# Patient Record
Sex: Female | Born: 1980 | Race: Black or African American | Hispanic: No | Marital: Single | Smoking: Current every day smoker
Health system: Southern US, Community
[De-identification: ages and names within clinical notes are randomized; demographics above are authoritative.]

## PROBLEM LIST (undated history)

## (undated) DIAGNOSIS — M1990 Unspecified osteoarthritis, unspecified site: Secondary | ICD-10-CM

## (undated) DIAGNOSIS — R21 Rash and other nonspecific skin eruption: Secondary | ICD-10-CM

## (undated) DIAGNOSIS — M329 Systemic lupus erythematosus, unspecified: Secondary | ICD-10-CM

## (undated) DIAGNOSIS — N179 Acute kidney failure, unspecified: Secondary | ICD-10-CM

## (undated) DIAGNOSIS — M3214 Glomerular disease in systemic lupus erythematosus: Secondary | ICD-10-CM

## (undated) DIAGNOSIS — I10 Essential (primary) hypertension: Secondary | ICD-10-CM

## (undated) DIAGNOSIS — Z87442 Personal history of urinary calculi: Secondary | ICD-10-CM

## (undated) DIAGNOSIS — D649 Anemia, unspecified: Secondary | ICD-10-CM

## (undated) DIAGNOSIS — N200 Calculus of kidney: Secondary | ICD-10-CM

## (undated) HISTORY — PX: TUBAL LIGATION: SHX77

## (undated) HISTORY — PX: PERITONEAL CATHETER INSERTION: SHX2223

---

## 2006-12-06 ENCOUNTER — Emergency Department: Payer: Self-pay | Admitting: Emergency Medicine

## 2007-02-08 ENCOUNTER — Encounter: Payer: Self-pay | Admitting: Maternal & Fetal Medicine

## 2007-03-22 ENCOUNTER — Encounter: Payer: Self-pay | Admitting: Maternal & Fetal Medicine

## 2007-04-30 ENCOUNTER — Encounter: Payer: Self-pay | Admitting: Maternal & Fetal Medicine

## 2007-06-06 ENCOUNTER — Ambulatory Visit: Payer: Self-pay | Admitting: Obstetrics and Gynecology

## 2007-06-07 ENCOUNTER — Inpatient Hospital Stay: Payer: Self-pay | Admitting: Obstetrics and Gynecology

## 2008-05-18 ENCOUNTER — Emergency Department: Payer: Self-pay | Admitting: Emergency Medicine

## 2009-07-13 ENCOUNTER — Emergency Department: Payer: Self-pay | Admitting: Emergency Medicine

## 2009-11-17 ENCOUNTER — Emergency Department: Payer: Self-pay | Admitting: Emergency Medicine

## 2010-09-06 ENCOUNTER — Emergency Department: Payer: Self-pay | Admitting: Emergency Medicine

## 2014-06-10 ENCOUNTER — Emergency Department: Payer: Self-pay | Admitting: Internal Medicine

## 2014-09-16 ENCOUNTER — Emergency Department: Payer: Self-pay | Admitting: Emergency Medicine

## 2014-09-16 LAB — WET PREP, GENITAL

## 2014-09-16 LAB — URINALYSIS, COMPLETE
BILIRUBIN, UR: NEGATIVE
Glucose,UR: NEGATIVE mg/dL (ref 0–75)
KETONE: NEGATIVE
NITRITE: NEGATIVE
Ph: 7 (ref 4.5–8.0)
Protein: 30
Specific Gravity: 1.016 (ref 1.003–1.030)

## 2014-09-16 LAB — GC/CHLAMYDIA PROBE AMP

## 2014-09-16 LAB — RAPID HIV SCREEN (HIV 1/2 AB+AG)

## 2016-03-20 ENCOUNTER — Encounter: Payer: Self-pay | Admitting: Emergency Medicine

## 2016-03-20 ENCOUNTER — Emergency Department
Admission: EM | Admit: 2016-03-20 | Discharge: 2016-03-20 | Disposition: A | Discharge status: Home / Self Care | Payer: Self-pay | Attending: Emergency Medicine | Admitting: Emergency Medicine

## 2016-03-20 DIAGNOSIS — F17210 Nicotine dependence, cigarettes, uncomplicated: Secondary | ICD-10-CM | POA: Insufficient documentation

## 2016-03-20 DIAGNOSIS — F1290 Cannabis use, unspecified, uncomplicated: Secondary | ICD-10-CM | POA: Insufficient documentation

## 2016-03-20 DIAGNOSIS — N390 Urinary tract infection, site not specified: Secondary | ICD-10-CM | POA: Insufficient documentation

## 2016-03-20 DIAGNOSIS — B9689 Other specified bacterial agents as the cause of diseases classified elsewhere: Secondary | ICD-10-CM

## 2016-03-20 DIAGNOSIS — N938 Other specified abnormal uterine and vaginal bleeding: Secondary | ICD-10-CM | POA: Insufficient documentation

## 2016-03-20 DIAGNOSIS — N760 Acute vaginitis: Secondary | ICD-10-CM | POA: Insufficient documentation

## 2016-03-20 LAB — COMPREHENSIVE METABOLIC PANEL
ALT: 17 U/L (ref 14–54)
AST: 36 U/L (ref 15–41)
Albumin: 4.1 g/dL (ref 3.5–5.0)
Alkaline Phosphatase: 49 U/L (ref 38–126)
Anion gap: 8 (ref 5–15)
BUN: 11 mg/dL (ref 6–20)
CALCIUM: 9 mg/dL (ref 8.9–10.3)
CO2: 25 mmol/L (ref 22–32)
Chloride: 104 mmol/L (ref 101–111)
Creatinine, Ser: 0.88 mg/dL (ref 0.44–1.00)
GLUCOSE: 112 mg/dL — AB (ref 65–99)
Potassium: 3.5 mmol/L (ref 3.5–5.1)
Sodium: 137 mmol/L (ref 135–145)
TOTAL PROTEIN: 8 g/dL (ref 6.5–8.1)
Total Bilirubin: 0.4 mg/dL (ref 0.3–1.2)

## 2016-03-20 LAB — CBC
HEMATOCRIT: 33.4 % — AB (ref 35.0–47.0)
HEMOGLOBIN: 10.9 g/dL — AB (ref 12.0–16.0)
MCH: 29.5 pg (ref 26.0–34.0)
MCHC: 32.8 g/dL (ref 32.0–36.0)
MCV: 89.9 fL (ref 80.0–100.0)
Platelets: 181 10*3/uL (ref 150–440)
RBC: 3.71 MIL/uL — ABNORMAL LOW (ref 3.80–5.20)
RDW: 14.7 % — AB (ref 11.5–14.5)
WBC: 4.8 10*3/uL (ref 3.6–11.0)

## 2016-03-20 LAB — WET PREP, GENITAL
Sperm: NONE SEEN
TRICH WET PREP: NONE SEEN
YEAST WET PREP: NONE SEEN

## 2016-03-20 LAB — URINALYSIS COMPLETE WITH MICROSCOPIC (ARMC ONLY)
Bilirubin Urine: NEGATIVE
Glucose, UA: NEGATIVE mg/dL
Ketones, ur: NEGATIVE mg/dL
Nitrite: NEGATIVE
PH: 8 (ref 5.0–8.0)
PROTEIN: 30 mg/dL — AB
Specific Gravity, Urine: 1.017 (ref 1.005–1.030)

## 2016-03-20 LAB — CHLAMYDIA/NGC RT PCR (ARMC ONLY)
Chlamydia Tr: NOT DETECTED
N gonorrhoeae: NOT DETECTED

## 2016-03-20 LAB — POCT PREGNANCY, URINE: Preg Test, Ur: NEGATIVE

## 2016-03-20 LAB — LIPASE, BLOOD: LIPASE: 17 U/L (ref 11–51)

## 2016-03-20 MED ORDER — CEPHALEXIN 500 MG PO CAPS: 500.0000 mg | ORAL_CAPSULE | Freq: Three times a day (TID) | ORAL | Status: DC

## 2016-03-20 MED ORDER — CEPHALEXIN 500 MG PO CAPS
500.0000 mg | ORAL_CAPSULE | Freq: Once | ORAL | Status: AC
  Administered 2016-03-20: 500 mg via ORAL
  Filled 2016-03-20: qty 1

## 2016-03-20 MED ORDER — FLUCONAZOLE 100 MG PO TABS: 100.0000 mg | ORAL_TABLET | Freq: Every day | ORAL | Status: AC

## 2016-03-20 MED ORDER — METRONIDAZOLE 500 MG PO TABS: 500.0000 mg | ORAL_TABLET | Freq: Two times a day (BID) | ORAL | Status: AC

## 2016-03-20 NOTE — ED Notes (Signed)
Pt states she had a period earlier this month but started spotting after period was over. Pt states she has been having clots and also c/o abdominal pain. Pt also states she sees blood only when she is going to the bathroom.  Denies any burning or discharge.

## 2016-03-20 NOTE — ED Provider Notes (Signed)
[facility] Emergency Department Provider Note  Time seen: 3:14 PM  I have reviewed the triage vital signs and the nursing notes.   HISTORY  Chief Complaint Vaginal Bleeding and Abdominal Pain    HPI Patricia Maldonado is a 35 y.o. female with no past medical history who presents to the emergency department with lower abdominal discomfort and vaginal bleeding. According to the patient since yesterday she has been having an urge to urinate but only a little bit of urine comes out. States lower abdominal pressure. She also states her period ended 2 days ago but she continues to have spotting and bleeding which is abnormal for her. Patient denies any dysuria, nausea, vomiting, diarrhea or fever. Denies any vaginal discharge but states continued mild vaginal bleeding.     History reviewed. No pertinent past medical history.  There are no active problems to display for this patient.   Past Surgical History  Procedure Laterality Date  . Tubal ligation      No current outpatient prescriptions on file.  Allergies Penicillins  Family History  Problem Relation Age of Onset  . Thyroid disease Mother   . Thyroid disease Father     Social History Social History  Substance Use Topics  . Smoking status: Current Every Day Smoker -- 1.00 packs/day    Types: Cigarettes  . Smokeless tobacco: None  . Alcohol Use: No    Review of Systems Constitutional: Negative for fever. Cardiovascular: Negative for chest pain. Respiratory: Negative for shortness of breath. Gastrointestinal: Lower abdominal pressure. Negative for nausea, vomiting, diarrhea Genitourinary: Negative for dysuria. Positive for urinary urgency. Musculoskeletal: Negative for back pain. Neurological: Negative for headache 10-point ROS otherwise negative.  ____________________________________________   PHYSICAL EXAM:  VITAL SIGNS: ED Triage Vitals  Enc Vitals Group     BP 03/20/16 1311  138/85 mmHg     Pulse Rate 03/20/16 1311 85     Resp 03/20/16 1311 18     Temp 03/20/16 1311 98.2 F (36.8 C)     Temp Source 03/20/16 1311 Oral     SpO2 03/20/16 1311 100 %     Weight 03/20/16 1311 105 lb (47.628 kg)     Height 03/20/16 1311 5\' 2"  (1.575 m)     Head Cir --      Peak Flow --      Pain Score 03/20/16 1312 5     Pain Loc --      Pain Edu? --      Excl. in [place]? --     Constitutional: Alert and oriented. Well appearing and in no distress. Eyes: Normal exam ENT   Head: Normocephalic and atraumatic.   Mouth/Throat: Mucous membranes are moist. Cardiovascular: Normal rate, regular rhythm. No murmur Respiratory: Normal respiratory effort without tachypnea nor retractions. Breath sounds are clear Gastrointestinal: Soft and nontender. No distention.  Musculoskeletal: Nontender with normal range of motion in all extremities. Neurologic:  Normal speech and language. No gross focal neurologic deficits  Skin:  Skin is warm, dry and intact.  Psychiatric: Mood and affect are normal.   ____________________________________________    INITIAL IMPRESSION / ASSESSMENT AND PLAN / ED COURSE  Pertinent labs & imaging results that were available during my care of the patient were reviewed by me and considered in my medical decision making (see chart for details).  Patient presents the emergency department lower abdominal pain/pressure, urinary urgency and continued vaginal bleeding. States her period stopped 2 days ago but then restarted and  she is having mild bleeding. Denies any vaginal discharge. Patient does not believe she is at risk for sexually transmitted diseases. Patient's labs are largely within normal limits besides a urinalysis consistent with a urinary tract infection including white blood cell clumps. We'll place the patient on Keflex, we will perform a pelvic examination, and continue to closely monitor in the emergency department.  Pelvic examination shows mild  clear vaginal discharge, no bleeding at this time. No cervical motion tenderness. No adnexal tenderness.  Wet prep consistent with bacterial vaginitis we'll discharge with Flagyl and Keflex, I will write the patient for a Diflucan tablet to be used if needed given the history of [place] infections in the past. ____________________________________________   FINAL CLINICAL IMPRESSION(S) / ED DIAGNOSES  Lower abdominal pain Dysfunction uterine bleeding Urinary tract infection   Harvest Dark, MD 03/20/16 1615

## 2016-03-20 NOTE — ED Notes (Signed)
Patient had period earlier this month and it was normal. Patient denies any changes with her medications. Urgency to urinate, denies frequency or painful urination. States bleeding only with urination.

## 2016-08-30 ENCOUNTER — Encounter: Payer: Self-pay | Admitting: Emergency Medicine

## 2016-08-30 ENCOUNTER — Inpatient Hospital Stay
Admission: EM | Admit: 2016-08-30 | Discharge: 2016-09-04 | DRG: 689 | Disposition: A | Discharge status: Home / Self Care | Payer: Medicaid Other | Attending: Internal Medicine | Admitting: Internal Medicine

## 2016-08-30 DIAGNOSIS — R110 Nausea: Secondary | ICD-10-CM | POA: Diagnosis present

## 2016-08-30 DIAGNOSIS — R809 Proteinuria, unspecified: Secondary | ICD-10-CM | POA: Diagnosis present

## 2016-08-30 DIAGNOSIS — N170 Acute kidney failure with tubular necrosis: Secondary | ICD-10-CM | POA: Diagnosis present

## 2016-08-30 DIAGNOSIS — F17210 Nicotine dependence, cigarettes, uncomplicated: Secondary | ICD-10-CM | POA: Diagnosis present

## 2016-08-30 DIAGNOSIS — E860 Dehydration: Secondary | ICD-10-CM | POA: Diagnosis present

## 2016-08-30 DIAGNOSIS — Z8349 Family history of other endocrine, nutritional and metabolic diseases: Secondary | ICD-10-CM

## 2016-08-30 DIAGNOSIS — N200 Calculus of kidney: Secondary | ICD-10-CM

## 2016-08-30 DIAGNOSIS — L259 Unspecified contact dermatitis, unspecified cause: Secondary | ICD-10-CM | POA: Diagnosis not present

## 2016-08-30 DIAGNOSIS — N3001 Acute cystitis with hematuria: Secondary | ICD-10-CM | POA: Diagnosis not present

## 2016-08-30 DIAGNOSIS — K5900 Constipation, unspecified: Secondary | ICD-10-CM | POA: Diagnosis not present

## 2016-08-30 DIAGNOSIS — Z880 Allergy status to penicillin: Secondary | ICD-10-CM | POA: Diagnosis not present

## 2016-08-30 DIAGNOSIS — N390 Urinary tract infection, site not specified: Secondary | ICD-10-CM

## 2016-08-30 DIAGNOSIS — N179 Acute kidney failure, unspecified: Secondary | ICD-10-CM

## 2016-08-30 DIAGNOSIS — N058 Unspecified nephritic syndrome with other morphologic changes: Secondary | ICD-10-CM | POA: Diagnosis present

## 2016-08-30 LAB — URINALYSIS COMPLETE WITH MICROSCOPIC (ARMC ONLY)
BACTERIA UA: NONE SEEN
BILIRUBIN URINE: NEGATIVE
GLUCOSE, UA: NEGATIVE mg/dL
Ketones, ur: NEGATIVE mg/dL
NITRITE: POSITIVE — AB
Protein, ur: >500 mg/dL — AB
SPECIFIC GRAVITY, URINE: 1.016 (ref 1.005–1.030)
pH: 6 (ref 5.0–8.0)

## 2016-08-30 LAB — CBC
HEMATOCRIT: 34.9 % — AB (ref 35.0–47.0)
Hemoglobin: 11.7 g/dL — ABNORMAL LOW (ref 12.0–16.0)
MCH: 30 pg (ref 26.0–34.0)
MCHC: 33.4 g/dL (ref 32.0–36.0)
MCV: 89.8 fL (ref 80.0–100.0)
PLATELETS: 152 10*3/uL (ref 150–440)
RBC: 3.89 MIL/uL (ref 3.80–5.20)
RDW: 14.8 % — AB (ref 11.5–14.5)
WBC: 5 10*3/uL (ref 3.6–11.0)

## 2016-08-30 LAB — COMPREHENSIVE METABOLIC PANEL
ALBUMIN: 2.7 g/dL — AB (ref 3.5–5.0)
ALK PHOS: 75 U/L (ref 38–126)
ALT: 18 U/L (ref 14–54)
AST: 26 U/L (ref 15–41)
Anion gap: 7 (ref 5–15)
BILIRUBIN TOTAL: 0.3 mg/dL (ref 0.3–1.2)
BUN: 16 mg/dL (ref 6–20)
CO2: 23 mmol/L (ref 22–32)
CREATININE: 2.23 mg/dL — AB (ref 0.44–1.00)
Calcium: 8.7 mg/dL — ABNORMAL LOW (ref 8.9–10.3)
Chloride: 108 mmol/L (ref 101–111)
GFR calc Af Amer: 32 mL/min — ABNORMAL LOW (ref >60)
GFR, EST NON AFRICAN AMERICAN: 28 mL/min — AB (ref >60)
GLUCOSE: 106 mg/dL — AB (ref 65–99)
POTASSIUM: 3.4 mmol/L — AB (ref 3.5–5.1)
Sodium: 138 mmol/L (ref 135–145)
TOTAL PROTEIN: 7.2 g/dL (ref 6.5–8.1)

## 2016-08-30 LAB — LACTIC ACID, PLASMA: LACTIC ACID, VENOUS: 1 mmol/L (ref 0.5–1.9)

## 2016-08-30 LAB — LIPASE, BLOOD: Lipase: 18 U/L (ref 11–51)

## 2016-08-30 MED ORDER — HEPARIN SODIUM (PORCINE) 5000 UNIT/ML IJ SOLN
5000.0000 [IU] | Freq: Three times a day (TID) | INTRAMUSCULAR | Status: DC
Start: 2016-08-30 — End: 2016-09-02
  Administered 2016-08-30 – 2016-09-02 (×7): 5000 [IU] via SUBCUTANEOUS
  Filled 2016-08-30 (×7): qty 1

## 2016-08-30 MED ORDER — ACETAMINOPHEN 325 MG PO TABS
650.0000 mg | ORAL_TABLET | Freq: Four times a day (QID) | ORAL | Status: DC | PRN
  Administered 2016-09-02 – 2016-09-03 (×2): 650 mg via ORAL
  Filled 2016-08-30 (×2): qty 2

## 2016-08-30 MED ORDER — OXYCODONE HCL 5 MG PO TABS
5.0000 mg | ORAL_TABLET | ORAL | Status: DC | PRN
  Administered 2016-09-01: 5 mg via ORAL
  Filled 2016-08-30: qty 1

## 2016-08-30 MED ORDER — ONDANSETRON HCL 4 MG PO TABS
4.0000 mg | ORAL_TABLET | Freq: Four times a day (QID) | ORAL | Status: DC | PRN
  Administered 2016-09-01: 4 mg via ORAL
  Filled 2016-08-30: qty 1

## 2016-08-30 MED ORDER — ONDANSETRON 4 MG PO TBDP
4.0000 mg | ORAL_TABLET | Freq: Once | ORAL | Status: AC | PRN
  Administered 2016-08-30: 4 mg via ORAL
  Filled 2016-08-30: qty 1

## 2016-08-30 MED ORDER — CEFTRIAXONE SODIUM-DEXTROSE 1-3.74 GM-% IV SOLR
1.0000 g | Freq: Once | INTRAVENOUS | Status: AC
  Administered 2016-08-30: 1 g via INTRAVENOUS
  Filled 2016-08-30: qty 50

## 2016-08-30 MED ORDER — CEFTRIAXONE SODIUM-DEXTROSE 1-3.74 GM-% IV SOLR
1.0000 g | INTRAVENOUS | Status: DC
  Administered 2016-08-31 – 2016-09-03 (×4): 1 g via INTRAVENOUS
  Filled 2016-08-30 (×5): qty 50

## 2016-08-30 MED ORDER — ONDANSETRON HCL 4 MG/2ML IJ SOLN
4.0000 mg | Freq: Four times a day (QID) | INTRAMUSCULAR | Status: DC | PRN
  Administered 2016-09-03: 4 mg via INTRAVENOUS
  Filled 2016-08-30: qty 2

## 2016-08-30 MED ORDER — SODIUM CHLORIDE 0.9 % IV BOLUS (SEPSIS)
1000.0000 mL | Freq: Once | INTRAVENOUS | Status: AC
  Administered 2016-08-30: 1000 mL via INTRAVENOUS

## 2016-08-30 MED ORDER — SODIUM CHLORIDE 0.9% FLUSH
3.0000 mL | Freq: Two times a day (BID) | INTRAVENOUS | Status: DC
  Administered 2016-08-30 – 2016-08-31 (×2): 3 mL via INTRAVENOUS

## 2016-08-30 MED ORDER — DEXTROSE 5 % IV SOLN: 1.0000 g | Freq: Once | INTRAVENOUS | Status: DC

## 2016-08-30 MED ORDER — DEXTROSE 5 % IV SOLN: 1.0000 g | INTRAVENOUS | Status: DC

## 2016-08-30 MED ORDER — ACETAMINOPHEN 650 MG RE SUPP: 650.0000 mg | Freq: Four times a day (QID) | RECTAL | Status: DC | PRN

## 2016-08-30 MED ORDER — SODIUM CHLORIDE 0.9 % IV SOLN
INTRAVENOUS | Status: DC
  Administered 2016-08-30 – 2016-09-04 (×8): via INTRAVENOUS

## 2016-08-30 NOTE — ED Provider Notes (Signed)
[facility] Emergency Department Provider Note   ____________________________________________   I have reviewed the triage vital signs and the nursing notes.   HISTORY  Chief Complaint Emesis; Rash; and Fever   History limited by: Not Limited   HPI Patricia Maldonado is a 35 y.o. female who presents to the emergency department today with concerns forfevers, nausea vomiting, and rash. The patient states that the symptoms started roughly 3 days ago. She states that her initial symptoms were for the nausea and vomiting. She had multiple episodes of vomiting she denies being able to keep any food in her stomach. In addition she started developed fevers. Did have some chills. The patient then developed a rash. She describes it as itchy. It is primarily of her thorax.    History reviewed. No pertinent past medical history.  There are no active problems to display for this patient.   Past Surgical History:  Procedure Laterality Date  . TUBAL LIGATION      Prior to Admission medications   Medication Sig Start Date End Date Taking? Authorizing Provider  cephALEXin (KEFLEX) 500 MG capsule Take 1 capsule (500 mg total) by mouth 3 (three) times daily. 03/20/16   Harvest Dark, MD    Allergies Penicillins  Family History  Problem Relation Age of Onset  . Thyroid disease Mother   . Thyroid disease Father     Social History Social History  Substance Use Topics  . Smoking status: Current Every Day Smoker    Packs/day: 0.50    Types: Cigarettes  . Smokeless tobacco: Never Used  . Alcohol use No    Review of Systems  Constitutional: Positive for fever and chills. Cardiovascular: Negative for chest pain. Respiratory: Negative for shortness of breath. Gastrointestinal: Positive for nausea and vomiting. Genitourinary: Negative for dysuria. Musculoskeletal: Negative for back pain. Skin: Positive for rash. Neurological: Negative for headaches, focal  weakness or numbness.  10-point ROS otherwise negative.  ____________________________________________   PHYSICAL EXAM:  VITAL SIGNS: ED Triage Vitals  Enc Vitals Group     BP 08/30/16 1708 (!) 131/94     Pulse Rate 08/30/16 1708 (!) 105     Resp 08/30/16 1708 18     Temp 08/30/16 1708 98.2 F (36.8 C)     Temp Source 08/30/16 1708 Oral     SpO2 08/30/16 1708 100 %     Weight 08/30/16 1709 100 lb (45.4 kg)     Height 08/30/16 1709 5\' 2"  (1.575 m)     Head Circumference --      Peak Flow --      Pain Score 08/30/16 1708 5   Constitutional: Alert and oriented. Well appearing and in no distress. Eyes: Conjunctivae are normal. Normal extraocular movements. ENT   Head: Normocephalic and atraumatic.   Nose: No congestion/rhinnorhea.   Mouth/Throat: Mucous membranes are moist.   Neck: No stridor. Hematological/Lymphatic/Immunilogical: No cervical lymphadenopathy. Cardiovascular: Tachycardic, regular rhythm.  No murmurs, rubs, or gallops.  Respiratory: Normal respiratory effort without tachypnea nor retractions. Breath sounds are clear and equal bilaterally. No wheezes/rales/rhonchi. Gastrointestinal: Soft and nontender. No distention.  Genitourinary: Deferred Musculoskeletal: Normal range of motion in all extremities. No lower extremity edema. Neurologic:  Normal speech and language. No gross focal neurologic deficits are appreciated.  Skin:  Skin is warm, dry and intact. Urticarial type rash noted to back. Psychiatric: Mood and affect are normal. Speech and behavior are normal. Patient exhibits appropriate insight and judgment.  ____________________________________________    LABS (  pertinent positives/negatives)  Labs Reviewed  COMPREHENSIVE METABOLIC PANEL - Abnormal; Notable for the following:       Result Value   Potassium 3.4 (*)    Glucose, Bld 106 (*)    Creatinine, Ser 2.23 (*)    Calcium 8.7 (*)    Albumin 2.7 (*)    GFR calc non Af Amer 28 (*)     GFR calc Af Amer 32 (*)    All other components within normal limits  CBC - Abnormal; Notable for the following:    Hemoglobin 11.7 (*)    HCT 34.9 (*)    RDW 14.8 (*)    All other components within normal limits  URINALYSIS COMPLETEWITH MICROSCOPIC ([facility] ONLY) - Abnormal; Notable for the following:    Color, Urine YELLOW (*)    APPearance CLOUDY (*)    Hgb urine dipstick 2+ (*)    Protein, ur >500 (*)    Nitrite POSITIVE (*)    Leukocytes, UA 3+ (*)    Squamous Epithelial / LPF 6-30 (*)    All other components within normal limits  URINE CULTURE  CULTURE, BLOOD (ROUTINE X 2)  CULTURE, BLOOD (ROUTINE X 2)  LIPASE, BLOOD  LACTIC ACID, PLASMA  BASIC METABOLIC PANEL  CBC     ____________________________________________   EKG  None  ____________________________________________    RADIOLOGY  None  ____________________________________________   PROCEDURES  Procedures  ____________________________________________   INITIAL IMPRESSION / ASSESSMENT AND PLAN / ED COURSE  Pertinent labs & imaging results that were available during my care of the patient were reviewed by me and considered in my medical decision making (see chart for details).  Patient presented to the emergency department today because of concerns for fever, vomiting and rash. Rash appears urticarial in nature. Patient's blood work was concerning for increased creatinine and acute kidney injury, likely secondary to dehydration secondary to poor oral intake. Additionally patient's urine was concerning for urinary tract infection. This could be the source of the patient's nausea and vomiting. Will treat with IV antibiotics. Patient was given IV fluids. Will require admission to the hospital service for further workup and management. ____________________________________________   FINAL CLINICAL IMPRESSION(S) / ED DIAGNOSES  Final diagnoses:  AKI (acute kidney injury) ([place])  Urinary tract infection  without hematuria, site unspecified     Note: This dictation was prepared with Dragon dictation. Any transcriptional errors that result from this process are unintentional    Nance Pear, MD 08/30/16 [phone]

## 2016-08-30 NOTE — H&P (Signed)
[place] [facility] [place] NAME: Patricia Maldonado    MR#:  [id number]  DATE OF BIRTH:  21-Jan-1981   DATE OF ADMISSION:  08/30/2016  PRIMARY CARE PHYSICIAN: No primary care provider on file.   REQUESTING/REFERRING PHYSICIAN: Archie Balboa  CHIEF COMPLAINT:   Chief Complaint  Patient presents with  . Emesis  . Rash  . Fever    HISTORY OF PRESENT ILLNESS:  Patricia Maldonado  is a 35 y.o. female without significant medical history who is presenting with fever and chills. States she's had about 1 week duration of nausea with vomiting nonbloody nonbilious emesis and watery diarrhea, weakness fatigue, poor by mouth intake. Her symptoms have slowly started to resolve and now she feels like she's having fever and chills. The Hospital further workup and evaluation.  PAST MEDICAL HISTORY:  History reviewed. No pertinent past medical history.  PAST SURGICAL HISTORY:   Past Surgical History:  Procedure Laterality Date  . TUBAL LIGATION      SOCIAL HISTORY:   Social History  Substance Use Topics  . Smoking status: Current Every Day Smoker    Packs/day: 0.50    Types: Cigarettes  . Smokeless tobacco: Never Used  . Alcohol use No    FAMILY HISTORY:   Family History  Problem Relation Age of Onset  . Thyroid disease Mother   . Thyroid disease Father     DRUG ALLERGIES:   Allergies  Allergen Reactions  . Penicillins     Yeast infection    REVIEW OF SYSTEMS:  REVIEW OF SYSTEMS:  CONSTITUTIONAL: Positive fevers, chills, fatigue, weakness.  EYES: Denies blurred vision, double vision, or eye pain.  EARS, NOSE, THROAT: Denies tinnitus, ear pain, hearing loss.  RESPIRATORY: denies cough, shortness of breath, wheezing  CARDIOVASCULAR: Denies chest pain, palpitations, edema.  GASTROINTESTINAL: Positive nausea, vomiting, diarrhea, denies abdominal pain.  GENITOURINARY: Denies dysuria, hematuria.  ENDOCRINE: Denies nocturia or thyroid  problems. HEMATOLOGIC AND LYMPHATIC: Denies easy bruising or bleeding.  SKIN: Denies rash or lesions.  MUSCULOSKELETAL: Denies pain in neck, back, shoulder, knees, hips, or further arthritic symptoms.  NEUROLOGIC: Denies paralysis, paresthesias.  PSYCHIATRIC: Denies anxiety or depressive symptoms. Otherwise full review of systems performed by me is negative.   MEDICATIONS AT HOME:   Prior to Admission medications   Not on File      VITAL SIGNS:  Blood pressure (!) 122/94, pulse 88, temperature 98.2 F (36.8 C), temperature source Oral, resp. rate 18, height 5\' 2"  (1.575 m), weight 45.4 kg (100 lb), last menstrual period 08/09/2016, SpO2 100 %.  PHYSICAL EXAMINATION:  VITAL SIGNS: Vitals:   08/30/16 1708 08/30/16 1830  BP: (!) 131/94 (!) 122/94  Pulse: (!) 105 88  Resp: 18   Temp: 98.2 F (36.8 C)    GENERAL:35 y.o.female currently in no acute distress. Shaking chills HEAD: Normocephalic, atraumatic.  EYES: Pupils equal, round, reactive to light. Extraocular muscles intact. No scleral icterus.  MOUTH: Moist mucosal membrane. Dentition intact. No abscess noted.  EAR, NOSE, THROAT: Clear without exudates. No external lesions.  NECK: Supple. No thyromegaly. No nodules. No JVD.  PULMONARY: Clear to ascultation, without wheeze rails or rhonci. No use of accessory muscles, Good respiratory effort. good air entry bilaterally CHEST: Nontender to palpation.  CARDIOVASCULAR: S1 and S2. Regular rate and rhythm. No murmurs, rubs, or gallops. No edema. Pedal pulses 2+ bilaterally.  GASTROINTESTINAL: Soft, nontender, nondistended. No masses. Positive bowel sounds. No hepatosplenomegaly.  MUSCULOSKELETAL: No swelling, clubbing, or  edema. Range of motion full in all extremities.  NEUROLOGIC: Cranial nerves II through XII are intact. No gross focal neurological deficits. Sensation intact. Reflexes intact.  SKIN: No ulceration, lesions, rashes, or cyanosis. Skin warm and dry. Turgor intact.   PSYCHIATRIC: Mood, affect within normal limits. The patient is awake, alert and oriented x 3. Insight, judgment intact.    LABORATORY PANEL:   CBC  Recent Labs Lab 08/30/16 1715  WBC 5.0  HGB 11.7*  HCT 34.9*  PLT 152   ------------------------------------------------------------------------------------------------------------------  Chemistries   Recent Labs Lab 08/30/16 1715  NA 138  K 3.4*  CL 108  CO2 23  GLUCOSE 106*  BUN 16  CREATININE 2.23*  CALCIUM 8.7*  AST 26  ALT 18  ALKPHOS 75  BILITOT 0.3   ------------------------------------------------------------------------------------------------------------------  Cardiac Enzymes No results for input(s): TROPONINI in the last 168 hours. ------------------------------------------------------------------------------------------------------------------  RADIOLOGY:  No results found.  EKG:  No orders found for this or any previous visit.  IMPRESSION AND PLAN:   35 year old African-American female without significant medical history who is presenting with fever chills  1. Acute renal failure: Check renal ultrasound provide IV fluid hydration follow renal function 2. Urinary tract infection: Ceftriaxone and follow culture data is, which included blood cultures given rigors     All the records are reviewed and case discussed with ED provider. Management plans discussed with the patient, family and they are in agreement.  CODE STATUS: Full  TOTAL TIME TAKING CARE OF THIS PATIENT: 33 minutes.    Kayda Allers,  Karenann Cai.D on 08/30/2016 at 7:29 PM  Between 7am to 6pm - Pager - [phone]  After 6pm: House Pager: - [phone]  [facility]  Office  [phone]  CC: Primary care physician; No primary care provider on file.

## 2016-08-30 NOTE — ED Notes (Signed)
Pt states rash, fever, and chills for several days, denies any urinary symptoms, family at bedside, pt awake and alert

## 2016-08-30 NOTE — ED Triage Notes (Signed)
Patient presents to the ED with nausea, vomiting, and rash x 3 days.  Patient also reports that she believes she has had a fever the past 2 days although she has not had a thermometer.  Patient reports recently changing soaps prior to the rash.  Patient reports vomiting x 3 today and reports 1 episode of diarrhea yesterday.  Patient denies abdominal pain.

## 2016-08-31 ENCOUNTER — Inpatient Hospital Stay: Payer: Medicaid Other

## 2016-08-31 DIAGNOSIS — N170 Acute kidney failure with tubular necrosis: Secondary | ICD-10-CM

## 2016-08-31 DIAGNOSIS — N200 Calculus of kidney: Secondary | ICD-10-CM

## 2016-08-31 LAB — BASIC METABOLIC PANEL
Anion gap: 4 — ABNORMAL LOW (ref 5–15)
BUN: 16 mg/dL (ref 6–20)
CALCIUM: 7.6 mg/dL — AB (ref 8.9–10.3)
CO2: 22 mmol/L (ref 22–32)
CREATININE: 2.1 mg/dL — AB (ref 0.44–1.00)
Chloride: 113 mmol/L — ABNORMAL HIGH (ref 101–111)
GFR calc Af Amer: 35 mL/min — ABNORMAL LOW (ref >60)
GFR, EST NON AFRICAN AMERICAN: 30 mL/min — AB (ref >60)
Glucose, Bld: 96 mg/dL (ref 65–99)
Potassium: 3.9 mmol/L (ref 3.5–5.1)
SODIUM: 139 mmol/L (ref 135–145)

## 2016-08-31 LAB — CBC
HCT: 30 % — ABNORMAL LOW (ref 35.0–47.0)
Hemoglobin: 10.1 g/dL — ABNORMAL LOW (ref 12.0–16.0)
MCH: 30.6 pg (ref 26.0–34.0)
MCHC: 33.6 g/dL (ref 32.0–36.0)
MCV: 91 fL (ref 80.0–100.0)
PLATELETS: 129 10*3/uL — AB (ref 150–440)
RBC: 3.3 MIL/uL — ABNORMAL LOW (ref 3.80–5.20)
RDW: 14.8 % — AB (ref 11.5–14.5)
WBC: 4.8 10*3/uL (ref 3.6–11.0)

## 2016-08-31 LAB — PROTEIN / CREATININE RATIO, URINE
Creatinine, Urine: 70 mg/dL
Protein Creatinine Ratio: 5.67 mg/mg{Cre} — ABNORMAL HIGH (ref 0.00–0.15)
TOTAL PROTEIN, URINE: 397 mg/dL

## 2016-08-31 LAB — TSH: TSH: 1.123 u[IU]/mL (ref 0.350–4.500)

## 2016-08-31 MED ORDER — METRONIDAZOLE 0.75 % EX GEL
Freq: Two times a day (BID) | CUTANEOUS | Status: DC
  Filled 2016-08-31: qty 45

## 2016-08-31 MED ORDER — METRONIDAZOLE 0.75 % EX CREA
TOPICAL_CREAM | Freq: Two times a day (BID) | CUTANEOUS | Status: DC
  Administered 2016-08-31 – 2016-09-01 (×4): via TOPICAL
  Administered 2016-09-02: 1 via TOPICAL
  Administered 2016-09-03 (×2): via TOPICAL
  Filled 2016-08-31: qty 45

## 2016-08-31 MED ORDER — TRIAMCINOLONE ACETONIDE 0.1 % EX CREA
TOPICAL_CREAM | Freq: Two times a day (BID) | CUTANEOUS | Status: DC
  Administered 2016-08-31 – 2016-09-01 (×4): via TOPICAL
  Administered 2016-09-02: 1 via TOPICAL
  Administered 2016-09-03: 22:00:00 via TOPICAL
  Filled 2016-08-31: qty 15

## 2016-08-31 NOTE — Progress Notes (Signed)
Patient ID: Patricia Maldonado, female   DOB: [birth year], 35 y.o.   MRN: [id number]  [employer] [facility] PROGRESS NOTE  Patricia Maldonado NIO:[id number] DOB: 07/22/1981 DOA: 08/30/2016 PCP: No PCP Per Patient  HPI/Subjective: Patient came in with nausea vomiting and dehydration. She feels better now. She was able to eat today.  Objective: Vitals:   08/30/16 2044 08/31/16 0537  BP: (!) 114/92 117/82  Pulse: 79 88  Resp: 18 18  Temp: 98.1 F (36.7 C) 98.9 F (37.2 C)    Filed Weights   08/30/16 1709  Weight: 45.4 kg (100 lb)    ROS: Review of Systems  Constitutional: Negative for chills and fever.  Eyes: Negative for blurred vision.  Respiratory: Negative for cough and shortness of breath.   Cardiovascular: Negative for chest pain.  Gastrointestinal: Negative for abdominal pain, constipation, diarrhea, nausea and vomiting.  Genitourinary: Negative for dysuria.  Musculoskeletal: Negative for joint pain.  Neurological: Negative for dizziness and headaches.   Exam: Physical Exam  Constitutional: She is oriented to person, place, and time.  HENT:  Nose: No mucosal edema.  Mouth/Throat: No oropharyngeal exudate or posterior oropharyngeal edema.  Eyes: Conjunctivae, EOM and lids are normal. Pupils are equal, round, and reactive to light.  Neck: No JVD present. Carotid bruit is not present. No edema present. No thyroid mass and no thyromegaly present.  Cardiovascular: S1 normal and S2 normal.  Exam reveals no gallop.   No murmur heard. Pulses:      Dorsalis pedis pulses are 2+ on the right side, and 2+ on the left side.  Respiratory: No respiratory distress. She has no wheezes. She has no rhonchi. She has no rales.  GI: Soft. Bowel sounds are normal. There is no tenderness.  Musculoskeletal:       Right ankle: She exhibits no swelling.       Left ankle: She exhibits no swelling.  Lymphadenopathy:    She has no cervical adenopathy.  Neurological: She is alert and oriented to person,  place, and time. No cranial nerve deficit.  Skin: Skin is warm. Nails show no clubbing.  Patient states that she had smaller dots that are red and itchy that have settled down now that she placed some steroid cream on it. Noticed her face with some redness and itchiness also.  Psychiatric: She has a normal mood and affect.      Data Reviewed: Basic Metabolic Panel:  Recent Labs Lab 08/30/16 1715 08/31/16 0455  NA 138 139  K 3.4* 3.9  CL 108 113*  CO2 23 22  GLUCOSE 106* 96  BUN 16 16  CREATININE 2.23* 2.10*  CALCIUM 8.7* 7.6*   Liver Function Tests:  Recent Labs Lab 08/30/16 1715  AST 26  ALT 18  ALKPHOS 75  BILITOT 0.3  PROT 7.2  ALBUMIN 2.7*    Recent Labs Lab 08/30/16 1715  LIPASE 18   CBC:  Recent Labs Lab 08/30/16 1715 08/31/16 0455  WBC 5.0 4.8  HGB 11.7* 10.1*  HCT 34.9* 30.0*  MCV 89.8 91.0  PLT 152 129*     Recent Results (from the past 240 hour(s))  CULTURE, BLOOD (ROUTINE X 2) w Reflex to ID Panel     Status: None (Preliminary result)   Collection Time: 08/30/16  8:59 PM  Result Value Ref Range Status   Specimen Description BLOOD  LEFT AC  Final   Special Requests   Final    BOTTLES DRAWN AEROBIC AND ANAEROBIC  ANA 9ML AER  12ML   Culture NO GROWTH < 12 HOURS  Final   Report Status PENDING  Incomplete  CULTURE, BLOOD (ROUTINE X 2) w Reflex to ID Panel     Status: None (Preliminary result)   Collection Time: 08/30/16  8:59 PM  Result Value Ref Range Status   Specimen Description BLOOD  LEFT WRIST  Final   Special Requests   Final    BOTTLES DRAWN AEROBIC AND ANAEROBIC  ANA 11ML AER 9ML   Culture NO GROWTH < 12 HOURS  Final   Report Status PENDING  Incomplete     Studies: US Renal  Result Date: 08/31/2016 CLINICAL DATA:  Acute renal failure EXAM: RENAL / URINARY TRACT ULTRASOUND COMPLETE COMPARISON:  None. FINDINGS: Right Kidney: Length: 14.7 cm. No hydronephrosis is seen. However, there are multiple echogenic foci of most  consistent with renal calculi. The largest shadowing calculus measures 4.0 cm in the midportion. There is a cyst in the medial midpole of 2.7 cm. The parenchyma of the right kidney is echogenic suggesting chronic renal medical disease. Left Kidney: Length: 12.9 cm. No echogenic foci are seen and there is no evidence of hydronephrosis. The parenchyma of the left kidney also is echogenic. Bladder: Any bladder is moderately well distended. Bilateral ureteral jets are visualized. IMPRESSION: 1. Multiple right renal calculi without hydronephrosis. No definite left renal calculi are seen. 2. Echogenic renal parenchyma consistent with chronic renal medical disease. 3. 2.7 cm cyst in the medial right kidney. Electronically Signed   By: Ivar Drape M.D.   On: 08/31/2016 10:56    Scheduled Meds: . cefTRIAXone  1 g Intravenous Q24H  . heparin  5,000 Units Subcutaneous Q8H  . metroNIDAZOLE   Topical BID  . sodium chloride flush  3 mL Intravenous Q12H  . triamcinolone cream   Topical BID   Continuous Infusions: . sodium chloride 75 mL/hr at 08/31/16 1138    Assessment/Plan:  1. Acute kidney injury. Continue IV fluid hydration. Case discussed with nephrology and urology. As per urology, medical renal disease is seen in people that are thin and sometimes it's an over read. Recommend getting a CT scanned renal stone protocol for further management of kidney stones. Recheck creatinine tomorrow morning. 2. Acute cystitis with hematuria continue Rocephin. Follow-up on urine culture. 3. Potential contact dermatitis with rash on body and face. I advised her to change her soap and detergent to free and clear brands. Steroid cream for the body. Metronidazole gel for the face. 4. Large nephrolithiasis right kidney. CT scan renal stone protocol. Will need urology follow-up as outpatient once infection cleared.  Code Status:     Code Status Orders        Start     Ordered   08/30/16 1913  Full code  Continuous      08/30/16 1913    Code Status History    Date Active Date Inactive Code Status Order ID Comments User Context   This patient has a current code status but no historical code status.     Disposition Plan: Potentially home tomorrow if creatinine improves  Consultants:  Nephrology  Case discussed with urology  Antibiotics:  Rocephin  Time spent: 25 minutes  Loletha Grayer  [employer]

## 2016-08-31 NOTE — Consult Note (Signed)
[facility] CONSULT NOTE    Date: 08/31/2016                  Patient Name:  Patricia Maldonado  MRN: [id number]  DOB: June 08, 1981  Age / Sex: 35 y.o., female         PCP: No PCP Per Patient                 Service Requesting Consult: Hospitalist                 Reason for Consult: Acute renal failure, proteinuria            History of Present Illness: Patient is a 35 y.o. female with past medical history of prior urinary tract infection, who was admitted to [facility] on 08/30/2016 for evaluation of nausea, vomiting, poor by mouth intake 2 weeks, and found to have acute renal failure. The patient's baseline creatinine is 0.88 from 03/20/2016. When she presented yesterday creatinine was 2.2 with an EGFR 32. Patient was started on IV fluid hydration and creatinine is slightly down to 2.1 with a BUN of 16.  Renal ultrasound was performed which showed multiple right renal calculi without hydronephrosis. The largest of these was 4 cm. In addition echogenic renal parenchyma was noted but this can go along with acute renal failure or chronic kidney disease. In addition she is noted as having hematuria, pyuria as well as proteinuria. She also reports that she's had a recent rash. Urology has also been consulted for the stone burden.   Medications: Outpatient medications: No prescriptions prior to admission.    Current medications: Current Facility-Administered Medications  Medication Dose Route Frequency Provider Last Rate Last Dose  . 0.9 %  sodium chloride infusion   Intravenous Continuous Loletha Grayer, MD 75 mL/hr at 08/31/16 1138    . acetaminophen (TYLENOL) tablet 650 mg  650 mg Oral Q6H PRN Lytle Butte, MD       Or  . acetaminophen (TYLENOL) suppository 650 mg  650 mg Rectal Q6H PRN Lytle Butte, MD      . cefTRIAXone (ROCEPHIN) IVPB 1 g  1 g Intravenous Q24H Lytle Butte, MD      . heparin injection 5,000 Units  5,000 Units Subcutaneous Q8H Lytle Butte, MD    5,000 Units at 08/31/16 0520  . metroNIDAZOLE (METROCREAM) 0.75 % cream   Topical BID Loletha Grayer, MD      . ondansetron [facility]) tablet 4 mg  4 mg Oral Q6H PRN Lytle Butte, MD       Or  . ondansetron [facility]) injection 4 mg  4 mg Intravenous Q6H PRN Lytle Butte, MD      . oxyCODONE (Oxy IR/ROXICODONE) immediate release tablet 5 mg  5 mg Oral Q4H PRN Lytle Butte, MD      . sodium chloride flush (NS) 0.9 % injection 3 mL  3 mL Intravenous Q12H Lytle Butte, MD   3 mL at 08/30/16 2245  . triamcinolone cream (KENALOG) 0.1 %   Topical BID Loletha Grayer, MD          Allergies: Allergies  Allergen Reactions  . Penicillins     Yeast infection      Past Medical History: Prior history of UTI  Past Surgical History: Past Surgical History:  Procedure Laterality Date  . TUBAL LIGATION       Family History: Family History  Problem Relation Age of Onset  .  Thyroid disease Mother   . Thyroid disease Father   Uncle with history of ESRD.   Social History: Social History   Social History  . Marital status: Single    Spouse name: N/A  . Number of children: N/A  . Years of education: N/A   Occupational History  . Not on file.   Social History Main Topics  . Smoking status: Current Every Day Smoker    Packs/day: 0.50    Types: Cigarettes  . Smokeless tobacco: Never Used  . Alcohol use No  . Drug use:     Types: Marijuana  . Sexual activity: Not on file   Other Topics Concern  . Not on file   Social History Narrative  . No narrative on file     Review of Systems: As per HPI  Vital Signs: Blood pressure 118/72, pulse 81, temperature 98 F (36.7 C), temperature source Oral, resp. rate 19, height '5\' 2"'  (0.272 m), weight 45.4 kg (100 lb), last menstrual period 08/09/2016, SpO2 98 %.  Weight trends: Filed Weights   08/30/16 1709  Weight: 45.4 kg (100 lb)    Physical Exam: General: NAD, slender female  Head: Normocephalic, atraumatic.  Eyes:  Anicteric, EOMI  Nose: Mucous membranes moist, not inflammed, nonerythematous.  Throat: Oropharynx nonerythematous, no exudate appreciated.   Neck: Supple, trachea midline.  Lungs:  Normal respiratory effort. Clear to auscultation BL without crackles or wheezes.  Heart: RRR. S1 and S2 normal without gallop, murmur, or rubs.  Abdomen:  BS normoactive. Soft, Nondistended, non-tender.  No masses or organomegaly.  Extremities: No pretibial edema.  Neurologic: A&O X3, Motor strength is 5/5 in the all 4 extremities  Skin: Areas of facial hyperpigmentation, mild area of erythema left antecubital fossa    Lab results: Basic Metabolic Panel:  Recent Labs Lab 08/30/16 1715 08/31/16 0455  NA 138 139  K 3.4* 3.9  CL 108 113*  CO2 23 22  GLUCOSE 106* 96  BUN 16 16  CREATININE 2.23* 2.10*  CALCIUM 8.7* 7.6*    Liver Function Tests:  Recent Labs Lab 08/30/16 1715  AST 26  ALT 18  ALKPHOS 75  BILITOT 0.3  PROT 7.2  ALBUMIN 2.7*    Recent Labs Lab 08/30/16 1715  LIPASE 18   No results for input(s): AMMONIA in the last 168 hours.  CBC:  Recent Labs Lab 08/30/16 1715 08/31/16 0455  WBC 5.0 4.8  HGB 11.7* 10.1*  HCT 34.9* 30.0*  MCV 89.8 91.0  PLT 152 129*    Cardiac Enzymes: No results for input(s): CKTOTAL, CKMB, CKMBINDEX, TROPONINI in the last 168 hours.  BNP: Invalid input(s): POCBNP  CBG: No results for input(s): GLUCAP in the last 168 hours.  Microbiology: Results for orders placed or performed during the hospital encounter of 08/30/16  CULTURE, BLOOD (ROUTINE X 2) w Reflex to ID Panel     Status: None (Preliminary result)   Collection Time: 08/30/16  8:59 PM  Result Value Ref Range Status   Specimen Description BLOOD  LEFT AC  Final   Special Requests   Final    BOTTLES DRAWN AEROBIC AND ANAEROBIC  ANA 9ML AER 12ML   Culture NO GROWTH < 12 HOURS  Final   Report Status PENDING  Incomplete  CULTURE, BLOOD (ROUTINE X 2) w Reflex to ID Panel      Status: None (Preliminary result)   Collection Time: 08/30/16  8:59 PM  Result Value Ref Range Status   Specimen Description BLOOD  LEFT WRIST  Final   Special Requests   Final    BOTTLES DRAWN AEROBIC AND ANAEROBIC  ANA 11ML AER 9ML   Culture NO GROWTH < 12 HOURS  Final   Report Status PENDING  Incomplete    Coagulation Studies: No results for input(s): LABPROT, INR in the last 72 hours.  Urinalysis:  Recent Labs  08/30/16 1715  COLORURINE YELLOW*  LABSPEC 1.016  PHURINE 6.0  GLUCOSEU NEGATIVE  HGBUR 2+*  BILIRUBINUR NEGATIVE  KETONESUR NEGATIVE  PROTEINUR >500*  NITRITE POSITIVE*  LEUKOCYTESUR 3+*      Imaging: US Renal  Result Date: 08/31/2016 CLINICAL DATA:  Acute renal failure EXAM: RENAL / URINARY TRACT ULTRASOUND COMPLETE COMPARISON:  None. FINDINGS: Right Kidney: Length: 14.7 cm. No hydronephrosis is seen. However, there are multiple echogenic foci of most consistent with renal calculi. The largest shadowing calculus measures 4.0 cm in the midportion. There is a cyst in the medial midpole of 2.7 cm. The parenchyma of the right kidney is echogenic suggesting chronic renal medical disease. Left Kidney: Length: 12.9 cm. No echogenic foci are seen and there is no evidence of hydronephrosis. The parenchyma of the left kidney also is echogenic. Bladder: Any bladder is moderately well distended. Bilateral ureteral jets are visualized. IMPRESSION: 1. Multiple right renal calculi without hydronephrosis. No definite left renal calculi are seen. 2. Echogenic renal parenchyma consistent with chronic renal medical disease. 3. 2.7 cm cyst in the medial right kidney. Electronically Signed   By: Ivar Drape M.D.   On: 08/31/2016 10:56      Assessment & Plan: Pt is a 35 y.o. female with a PMHX of prior urinary tract infection was admitted to [facility] on 08/30/2016 with nausea, vomiting, poor by mouth intake 2 weeks and subsequent he found to have acute renal failure. Patient also appears  to have hematuria, pyuria, and proteinuria.  1. Acute renal failure, etiology currently unclear but patient with poor by mouth intake 2 weeks. 2. Proteinuria. 3. Hematuria with pyuria. 4. Multiple right renal calculi without hydronephrosis.  Plan: The patient presents with an interesting case. She's been having nausea and vomiting for the past 2 weeks and has not been able to keep food down for this period. Therefore she could've had a prolonged prerenal state which is now lead to acute tubular necrosis. However we also note the presence of proteinuria, hematuria, and pyuria. She could potentially have an underlying urinary tract infection however we should pursue additional serologic workup as well to make sure there is no underlying glomerulonephritis. She has echogenic renal parenchyma which can be compatible with either acute renal failure or chronic kidney disease. No urgent indication for dialysis at the moment. We also agree with urology consultation. Continue ceftriaxone at this point in time. Avoid nephrotoxins as possible.

## 2016-08-31 NOTE — Consult Note (Signed)
Urology Consult  I have been asked to see the patient by Dr. Leslye Peer, for evaluation and management of right renal calculi.  Chief Complaint: n/v  History of Present Illness: Patricia Maldonado is a 35 y.o. year old admitted to the medical service with fevers, chills, nausea and vomiting, fatigue, and poor appetite for greater than 1 week. She is also had some loose stool. Further workup revealed evidence of urinary tract infection, acute renal failure, and renal ultrasound showed lateral echogenic kidneys with significant right-sided stone burden.  She underwent CT stone protocol earlier today which showed a large partial staghorn stone involving the lower and mid poles extending into the renal pelvis and proximal ureter without associated hydronephrosis. There was some mild mild caliectasis and evidence of chronic inflammation.  No significant stone burden on contralateral side.  She denies a personal history of stones. She denies any flank pain, gross hematuria, or urinary symptoms including urinary frequency, urgency, or dysuria.  She was treated for urinary tract infection in June 2017 but prior to this, no history of urinary tract infections other than during pregnancy.  Her baseline creatinine is normal. On admission, her creatinine was elevated to 2.23, down to 2.1.  No significant leukocytosis or evidence of hemodynamic instability or fever.  Her UA is grossly positive for nitrites and TNTC WBC.   History reviewed. No pertinent past medical history.  Past Surgical History:  Procedure Laterality Date  . TUBAL LIGATION      Home Medications:  No outpatient prescriptions have been marked as taking for the 08/30/16 encounter [facility] Encounter).    Allergies:  Allergies  Allergen Reactions  . Penicillins     Yeast infection    Family History  Problem Relation Age of Onset  . Thyroid disease Mother   . Thyroid disease Father     Social History:  reports that she has  been smoking Cigarettes.  She has been smoking about 0.50 packs per day. She has never used smokeless tobacco. She reports that she uses drugs, including Marijuana. She reports that she does not drink alcohol.  ROS: A complete review of systems was performed.  All systems are negative except for pertinent findings as noted.  Physical Exam:  Vital signs in last 24 hours: Temp:  [98 F (36.7 C)-98.9 F (37.2 C)] 98 F (36.7 C) (11/29 1300) Pulse Rate:  [79-88] 81 (11/29 1300) Resp:  [18-19] 19 (11/29 1300) BP: (114-118)/(72-92) 118/72 (11/29 1300) SpO2:  [98 %-99 %] 98 % (11/29 1300) Constitutional:  Alert and oriented, No acute distress HEENT: [place] AT, moist mucus membranes.  Trachea midline, no masses Cardiovascular: Regular rate and rhythm, no clubbing, cyanosis, or edema. Respiratory: Normal respiratory effort, lungs clear bilaterally GI: Abdomen is soft, nontender, nondistended, no abdominal masses GU: No CVA tenderness Skin: No rashes, bruises or suspicious lesions Lymph: No cervical or inguinal adenopathy Neurologic: Grossly intact, no focal deficits, moving all 4 extremities Psychiatric: Normal mood and affect   Laboratory Data:   Recent Labs  08/30/16 1715 08/31/16 0455  WBC 5.0 4.8  HGB 11.7* 10.1*  HCT 34.9* 30.0*    Recent Labs  08/30/16 1715 08/31/16 0455  NA 138 139  K 3.4* 3.9  CL 108 113*  CO2 23 22  GLUCOSE 106* 96  BUN 16 16  CREATININE 2.23* 2.10*  CALCIUM 8.7* 7.6*   No results for input(s): LABPT, INR in the last 72 hours. No results for input(s): LABURIN in the last 72  hours. Results for orders placed or performed during the hospital encounter of 08/30/16  CULTURE, BLOOD (ROUTINE X 2) w Reflex to ID Panel     Status: None (Preliminary result)   Collection Time: 08/30/16  8:59 PM  Result Value Ref Range Status   Specimen Description BLOOD  LEFT AC  Final   Special Requests   Final    BOTTLES DRAWN AEROBIC AND ANAEROBIC  ANA 9ML AER 12ML    Culture NO GROWTH < 12 HOURS  Final   Report Status PENDING  Incomplete  CULTURE, BLOOD (ROUTINE X 2) w Reflex to ID Panel     Status: None (Preliminary result)   Collection Time: 08/30/16  8:59 PM  Result Value Ref Range Status   Specimen Description BLOOD  LEFT WRIST  Final   Special Requests   Final    BOTTLES DRAWN AEROBIC AND ANAEROBIC  ANA 11ML AER 9ML   Culture NO GROWTH < 12 HOURS  Final   Report Status PENDING  Incomplete     Radiologic Imaging: US Renal  Result Date: 08/31/2016 CLINICAL DATA:  Acute renal failure EXAM: RENAL / URINARY TRACT ULTRASOUND COMPLETE COMPARISON:  None. FINDINGS: Right Kidney: Length: 14.7 cm. No hydronephrosis is seen. However, there are multiple echogenic foci of most consistent with renal calculi. The largest shadowing calculus measures 4.0 cm in the midportion. There is a cyst in the medial midpole of 2.7 cm. The parenchyma of the right kidney is echogenic suggesting chronic renal medical disease. Left Kidney: Length: 12.9 cm. No echogenic foci are seen and there is no evidence of hydronephrosis. The parenchyma of the left kidney also is echogenic. Bladder: Any bladder is moderately well distended. Bilateral ureteral jets are visualized. IMPRESSION: 1. Multiple right renal calculi without hydronephrosis. No definite left renal calculi are seen. 2. Echogenic renal parenchyma consistent with chronic renal medical disease. 3. 2.7 cm cyst in the medial right kidney. Electronically Signed   By: Ivar Drape M.D.   On: 08/31/2016 10:56   Ct Renal Stone Study  Result Date: 08/31/2016 CLINICAL DATA:  Nephrolithiasis.  Nausea, vomiting, and rash. EXAM: CT ABDOMEN AND PELVIS WITHOUT CONTRAST TECHNIQUE: Multidetector CT imaging of the abdomen and pelvis was performed following the standard protocol without IV contrast. COMPARISON:  Renal ultrasound 08/31/2016 FINDINGS: Lower chest: The visualized lung bases are clear. Hepatobiliary: No focal liver abnormality is  seen. No gallstones, gallbladder wall thickening, or biliary dilatation. Pancreas: Unremarkable. Spleen: Unremarkable. Adrenals/Urinary Tract: Unremarkable adrenal glands. There is a punctate 1 mm nonobstructing calculus in the interpolar left kidney. There is a large staghorn calculus in the right kidney involving the renal pelvis and lower pole calices and measuring greater than 4 cm in size. A few additional smaller separate calculi are present in the right lower pole. There is mild right lower pole caliectasis as well as mild dilatation of the renal pelvis and/or mild adjacent soft tissue thickening/ inflammation. There is no ureteral dilatation. A 2.1 cm cyst is present in the interpolar right kidney. Stomach/Bowel: The stomach is within normal limits. There is no evidence of bowel obstruction. No gross bowel wall thickening or inflammation is identified within the limitations of underdistention, lack of oral contrast, and paucity of intra-abdominal fat. The appendix is unremarkable. Vascular/Lymphatic: Normal caliber of the abdominal aorta. Small para-aortic and paracaval lymph nodes as well as small bilateral inguinal lymph nodes, nonspecific but which may be reactive. Reproductive: Uterus and bilateral adnexa are unremarkable. Other: Small volume free fluid in the  pelvis. Multiple pelvic phleboliths. No abdominal wall mass or hernia. Musculoskeletal: No acute osseous abnormality or suspicious osseous lesion. IMPRESSION: 1. 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 2. Punctate nonobstructing left renal calculus. 3. Small volume pelvic free fluid, potentially physiologic. Electronically Signed   By: Logan Bores M.D.   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   Renal failure thought to be secondary to prerenal causes although being evaluated by nephrology for possible underlying glomerulonephritis although less likely.  Plan:  Findings were discussed today with the patient at length.  I explained that after her infection is adequately treated which will require a prolonged course, at least 2 weeks of oral antibiotics, we will arrange for outpatient follow-up to arrange for right percutaneous nephrolithotomy. Without intervention, the stone will likely continue to progress, develop chronic inflammatory changes, atrophy, and possible obstruction with parenchymal loss or recurrent infections.    The procedure was briefly discussed today including small flank incision and need for overnight hospitalization. The remainder of the details of surgery will be discussed further as an outpatient. All of her questions were answered today.  Agree with IV antibiotics, transitioned to oral antibiotics for at least 2 weeks upon discharge.  Continue IV hydration, follow-up repeat serum creatinine.   08/31/2016, 8:28 PM  [name],  MD

## 2016-08-31 NOTE — Progress Notes (Addendum)
Initial Nutrition Assessment  DOCUMENTATION CODES:   Not applicable  INTERVENTION:  -Cater to pt preferences; offered scheduled snacks between meals, pt declined at this time  NUTRITION DIAGNOSIS:   Inadequate oral intake related to acute illness as evidenced by per patient/family report.  GOAL:   Patient will meet greater than or equal to 90% of their needs  MONITOR:   PO intake, Labs, Weight trends  REASON FOR ASSESSMENT:   Malnutrition Screening Tool    ASSESSMENT:   35 yo female admitted with fever and chills with UTI, ARF. Pt with N/V, watery diarrhea, weakness/fatigue and poor po intake x 1 week prior to admission. No significant past medical history  Pt reports poor appetite for 3 weeks, not able to tolerate much of anything for 1 week due to N/V. Pt reports 8 pound wt loss, 7.4% wt loss. Pt tolerated breakfast this AM, recorded po intake 100%. No N/V  Nutrition-Focused physical exam completed. Findings are WDL for fat depletion, muscle depletion, and edema.   Labs: reviewed  Meds: NS at 100 ml/hr  Diet Order:  Diet regular Room service appropriate? Yes; Fluid consistency: Thin  Skin:  Reviewed, no issues  Last BM:  11/26  Height:   Ht Readings from Last 1 Encounters:  08/30/16 5\' 2"  (1.575 m)    Weight:   Wt Readings from Last 1 Encounters:  08/30/16 100 lb (45.4 kg)   BMI:  Body mass index is 18.29 kg/m.  Estimated Nutritional Needs:   Kcal:  1300-1600 kcals  Protein:  68-80 g  Fluid:  >/= 1.5 L  EDUCATION NEEDS:   No education needs identified at this time  [place], [place], [place] [phone] Pager  [phone] Weekend/On-Call Pager

## 2016-08-31 NOTE — Progress Notes (Addendum)
Patient discussed with Dr. Earleen Newport today, ultrasound reviewed personally.  No evidence of obstructing stones on RUS. Given the size of stones or renal ultrasound, we'll go ahead and obtain CT stone protocol for surgical planning purposes to assess overall stone burden.  Certainly, no surgical intervention is planned during this admission in the setting of an active UTI.  Baseline renal function is normal, admitted with nausea vomiting, pyelonephritis, acute renal failure thought to be prerenal.  Finding of echogenic kidneys is incidental, often seen in pediatric and thin patients.  We will see patient later today vs tomorrow after CT scan but likely no intervention during this admission.  [name], MD

## 2016-09-01 LAB — ANA W/REFLEX IF POSITIVE: Anti Nuclear Antibody(ANA): POSITIVE — AB

## 2016-09-01 LAB — PROTEIN ELECTROPHORESIS, SERUM
A/G RATIO SPE: 0.7 (ref 0.7–1.7)
Albumin ELP: 2.5 g/dL — ABNORMAL LOW (ref 2.9–4.4)
Alpha-1-Globulin: 0.2 g/dL (ref 0.0–0.4)
Alpha-2-Globulin: 1 g/dL (ref 0.4–1.0)
Beta Globulin: 0.7 g/dL (ref 0.7–1.3)
GLOBULIN, TOTAL: 3.4 g/dL (ref 2.2–3.9)
Gamma Globulin: 1.5 g/dL (ref 0.4–1.8)
Total Protein ELP: 5.9 g/dL — ABNORMAL LOW (ref 6.0–8.5)

## 2016-09-01 LAB — ENA+DNA/DS+ANTICH+CENTRO+JO...
Anti JO-1: <0.2 AI (ref 0.0–0.9)
Chromatin Ab SerPl-aCnc: >8 AI — ABNORMAL HIGH (ref 0.0–0.9)
ENA SM Ab Ser-aCnc: >8 AI — ABNORMAL HIGH (ref 0.0–0.9)
Ribonucleic Protein: >8 AI — ABNORMAL HIGH (ref 0.0–0.9)
Scleroderma (Scl-70) (ENA) Antibody, IgG: <0.2 AI (ref 0.0–0.9)
ds DNA Ab: 23 IU/mL — ABNORMAL HIGH (ref 0–9)

## 2016-09-01 LAB — BASIC METABOLIC PANEL
Anion gap: 4 — ABNORMAL LOW (ref 5–15)
BUN: 15 mg/dL (ref 6–20)
CALCIUM: 7.8 mg/dL — AB (ref 8.9–10.3)
CO2: 20 mmol/L — AB (ref 22–32)
CREATININE: 2.02 mg/dL — AB (ref 0.44–1.00)
Chloride: 114 mmol/L — ABNORMAL HIGH (ref 101–111)
GFR calc Af Amer: 36 mL/min — ABNORMAL LOW (ref >60)
GFR calc non Af Amer: 31 mL/min — ABNORMAL LOW (ref >60)
Glucose, Bld: 95 mg/dL (ref 65–99)
Potassium: 3.8 mmol/L (ref 3.5–5.1)
Sodium: 138 mmol/L (ref 135–145)

## 2016-09-01 LAB — C4 COMPLEMENT: COMPLEMENT C4, BODY FLUID: 12 mg/dL — AB (ref 14–44)

## 2016-09-01 LAB — ANTISTREPTOLYSIN O TITER: ASO: 221 IU/mL — ABNORMAL HIGH (ref 0.0–200.0)

## 2016-09-01 LAB — URINE CULTURE

## 2016-09-01 LAB — GLOMERULAR BASEMENT MEMBRANE ANTIBODIES: GBM AB: 3 U (ref 0–20)

## 2016-09-01 LAB — MPO/PR-3 (ANCA) ANTIBODIES

## 2016-09-01 LAB — C3 COMPLEMENT: C3 Complement: 73 mg/dL — ABNORMAL LOW (ref 82–167)

## 2016-09-01 NOTE — Progress Notes (Signed)
[facility]  ROUNDING NOTE   Subjective:  Some of the serologic workup has returned. Patient has elevated ASO titer as well as low complement levels. She also has associated hematuria and proteinuria. It is suspected that she has a smoldering infection in the right kidney. 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   Objective:  Vital signs in last 24 hours:  Temp:  [98.3 F (36.8 C)-98.4 F (36.9 C)] 98.4 F (36.9 C) (11/30 1227) Pulse Rate:  [73-89] 79 (11/30 1227) Resp:  [17-20] 18 (11/30 1227) BP: (113-130)/(82-88) 113/82 (11/30 1227) SpO2:  [99 %-100 %] 99 % (11/30 1227)  Weight change:  Filed Weights   08/30/16 1709  Weight: 45.4 kg (100 lb)    Intake/Output: I/O last 3 completed shifts: In: 4335.7 [P.O.:240; I.V.:3095.7; IV Piggyback:1000] Out: 1250 [Urine:1250]   Intake/Output this shift:  Total I/O In: 1154 [P.O.:480; I.V.:674] Out: 100 [Urine:100]  Physical Exam: General: No acute distress  Head: Normocephalic, atraumatic. Moist oral mucosal membranes  Eyes: Anicteric  Neck: Supple, trachea midline  Lungs:  Clear to auscultation, normal effort  Heart: S1S2 no rubs  Abdomen:  Soft, nontender,   Extremities:  peripheral edema.  Neurologic: Nonfocal, moving all four extremities  Skin: No lesions       Basic Metabolic Panel:  Recent Labs Lab 08/30/16 1715 08/31/16 0455 09/01/16 0608  NA 138 139 138  K 3.4* 3.9 3.8  CL 108 113* 114*  CO2 23 22 20*  GLUCOSE 106* 96 95  BUN 16 16 15   CREATININE 2.23* 2.10* 2.02*  CALCIUM 8.7* 7.6* 7.8*    Liver Function Tests:  Recent Labs Lab 08/30/16 1715  AST 26  ALT 18  ALKPHOS 75  BILITOT 0.3  PROT 7.2  ALBUMIN 2.7*    Recent Labs Lab 08/30/16 1715  LIPASE 18   No results for input(s): AMMONIA in the last 168 hours.  CBC:  Recent Labs Lab 08/30/16 1715 08/31/16 0455  WBC 5.0 4.8  HGB 11.7* 10.1*  HCT 34.9* 30.0*  MCV 89.8 91.0  PLT  152 129*    Cardiac Enzymes: No results for input(s): CKTOTAL, CKMB, CKMBINDEX, TROPONINI in the last 168 hours.  BNP: Invalid input(s): POCBNP  CBG: No results for input(s): GLUCAP in the last 168 hours.  Microbiology: Results for orders placed or performed during the hospital encounter of 08/30/16  Urine culture     Status: Abnormal   Collection Time: 08/30/16  5:15 PM  Result Value Ref Range Status   Specimen Description URINE, RANDOM  Final   Special Requests NONE  Final   Culture MULTIPLE SPECIES PRESENT, SUGGEST RECOLLECTION (A)  Final   Report Status 09/01/2016 FINAL  Final  CULTURE, BLOOD (ROUTINE X 2) w Reflex to ID Panel     Status: None (Preliminary result)   Collection Time: 08/30/16  8:59 PM  Result Value Ref Range Status   Specimen Description BLOOD  LEFT AC  Final   Special Requests   Final    BOTTLES DRAWN AEROBIC AND ANAEROBIC  ANA 9ML AER 12ML   Culture NO GROWTH 2 DAYS  Final   Report Status PENDING  Incomplete  CULTURE, BLOOD (ROUTINE X 2) w Reflex to ID Panel     Status: None (Preliminary result)   Collection Time: 08/30/16  8:59 PM  Result Value Ref Range Status   Specimen Description BLOOD  LEFT WRIST  Final   Special Requests   Final    BOTTLES  DRAWN AEROBIC AND ANAEROBIC  ANA 11ML AER 9ML   Culture NO GROWTH 2 DAYS  Final   Report Status PENDING  Incomplete    Coagulation Studies: No results for input(s): LABPROT, INR in the last 72 hours.  Urinalysis:  Recent Labs  08/30/16 1715  COLORURINE YELLOW*  LABSPEC 1.016  PHURINE 6.0  GLUCOSEU NEGATIVE  HGBUR 2+*  BILIRUBINUR NEGATIVE  KETONESUR NEGATIVE  PROTEINUR >500*  NITRITE POSITIVE*  LEUKOCYTESUR 3+*      Imaging: US Renal  Result Date: 08/31/2016 CLINICAL DATA:  Acute renal failure EXAM: RENAL / URINARY TRACT ULTRASOUND COMPLETE COMPARISON:  None. FINDINGS: Right Kidney: Length: 14.7 cm. No hydronephrosis is seen. However, there are multiple echogenic foci of most consistent  with renal calculi. The largest shadowing calculus measures 4.0 cm in the midportion. There is a cyst in the medial midpole of 2.7 cm. The parenchyma of the right kidney is echogenic suggesting chronic renal medical disease. Left Kidney: Length: 12.9 cm. No echogenic foci are seen and there is no evidence of hydronephrosis. The parenchyma of the left kidney also is echogenic. Bladder: Any bladder is moderately well distended. Bilateral ureteral jets are visualized. IMPRESSION: 1. Multiple right renal calculi without hydronephrosis. No definite left renal calculi are seen. 2. Echogenic renal parenchyma consistent with chronic renal medical disease. 3. 2.7 cm cyst in the medial right kidney. Electronically Signed   By: Ivar Drape M.D.   On: 08/31/2016 10:56   Ct Renal Stone Study  Result Date: 08/31/2016 CLINICAL DATA:  Nephrolithiasis.  Nausea, vomiting, and rash. EXAM: CT ABDOMEN AND PELVIS WITHOUT CONTRAST TECHNIQUE: Multidetector CT imaging of the abdomen and pelvis was performed following the standard protocol without IV contrast. COMPARISON:  Renal ultrasound 08/31/2016 FINDINGS: Lower chest: The visualized lung bases are clear. Hepatobiliary: No focal liver abnormality is seen. No gallstones, gallbladder wall thickening, or biliary dilatation. Pancreas: Unremarkable. Spleen: Unremarkable. Adrenals/Urinary Tract: Unremarkable adrenal glands. There is a punctate 1 mm nonobstructing calculus in the interpolar left kidney. There is a large staghorn calculus in the right kidney involving the renal pelvis and lower pole calices and measuring greater than 4 cm in size. A few additional smaller separate calculi are present in the right lower pole. There is mild right lower pole caliectasis as well as mild dilatation of the renal pelvis and/or mild adjacent soft tissue thickening/ inflammation. There is no ureteral dilatation. A 2.1 cm cyst is present in the interpolar right kidney. Stomach/Bowel: The stomach is  within normal limits. There is no evidence of bowel obstruction. No gross bowel wall thickening or inflammation is identified within the limitations of underdistention, lack of oral contrast, and paucity of intra-abdominal fat. The appendix is unremarkable. Vascular/Lymphatic: Normal caliber of the abdominal aorta. Small para-aortic and paracaval lymph nodes as well as small bilateral inguinal lymph nodes, nonspecific but which may be reactive. Reproductive: Uterus and bilateral adnexa are unremarkable. Other: Small volume free fluid in the pelvis. Multiple pelvic phleboliths. No abdominal wall mass or hernia. Musculoskeletal: No acute osseous abnormality or suspicious osseous lesion. IMPRESSION: 1. 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 2. Punctate nonobstructing left renal calculus. 3. Small volume pelvic free fluid, potentially physiologic. Electronically Signed   By: Logan Bores M.D.   On: 08/31/2016 16:25     Medications:   . sodium chloride 75 mL/hr at 09/01/16 0612   . cefTRIAXone  1 g Intravenous Q24H  . heparin  5,000 Units Subcutaneous Q8H  .  metroNIDAZOLE   Topical BID  . sodium chloride flush  3 mL Intravenous Q12H  . triamcinolone cream   Topical BID   acetaminophen **OR** acetaminophen, ondansetron **OR** ondansetron (ZOFRAN) IV, oxyCODONE  Assessment/ Plan:  35 y.o. female with a PMHX of prior urinary tract infection was admitted to [facility] on 08/30/2016 with nausea, vomiting, poor by mouth intake 2 weeks and subsequent he found to have acute renal failure. Patient also appears to have hematuria, pyuria, and proteinuria.  1. 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 Proteinuria. 3. Hematuria with pyuria. 4. Multiple right renal calculi without hydronephrosis.  Staghorn calculus noted.  Plan:  Significant laboratory studies were performed  yesterday.  She was found to have a positive ASO and also low complements.  These suggest underlying glomerulonephritis.  We suspect that this is likely postinfectious in nature.  She denies having had a sore throat over the past 2 weeks.  Case discussed with urology.  It is felt that she may have an underlying smoldering infection in the right kidney given the staghorn calculus.  She will need a renal biopsy at some point in time however given concerns for underlying infectionwe elect to hold off on renal biopsy.  For the same reason nephrolithotomy has been held off.  We can consider renal biopsy once the staghorn calculi has been removed and infection has been adequately treated.  At this point in time we recommend continued supportive care and treatment of complications of glomerular nephritis such as hypertension and lower extremity edema.    LOS: 2 Zared Knoth 11/30/20174:33 PM

## 2016-09-01 NOTE — Progress Notes (Signed)
Patient ID: Patricia Maldonado, female   DOB: 11/25/1980, 35 y.o.   MRN: [id number]   [employer] PROGRESS NOTE  Patricia Maldonado GUR:[id number] DOB: 1981-04-26 DOA: 08/30/2016 PCP: No PCP Per Patient  HPI/Subjective: Patient feeling better and able to eat and drink. Urinating a lot. No abdominal pain or back pain.  Objective: Vitals:   09/01/16 0507 09/01/16 1227  BP: 130/88 113/82  Pulse: 73 79  Resp: 17 18  Temp: 98.4 F (36.9 C) 98.4 F (36.9 C)    Filed Weights   08/30/16 1709  Weight: 45.4 kg (100 lb)    ROS: Review of Systems  Constitutional: Negative for chills and fever.  Eyes: Negative for blurred vision.  Respiratory: Negative for cough and shortness of breath.   Cardiovascular: Negative for chest pain.  Gastrointestinal: Negative for abdominal pain, constipation, diarrhea, nausea and vomiting.  Genitourinary: Negative for dysuria.  Musculoskeletal: Negative for joint pain.  Neurological: Negative for dizziness and headaches.   Exam: Physical Exam  Constitutional: She is oriented to person, place, and time.  HENT:  Nose: No mucosal edema.  Mouth/Throat: No oropharyngeal exudate or posterior oropharyngeal edema.  Eyes: Conjunctivae, EOM and lids are normal. Pupils are equal, round, and reactive to light.  Neck: No JVD present. Carotid bruit is not present. No edema present. No thyroid mass and no thyromegaly present.  Cardiovascular: S1 normal and S2 normal.  Exam reveals no gallop.   No murmur heard. Pulses:      Dorsalis pedis pulses are 2+ on the right side, and 2+ on the left side.  Respiratory: No respiratory distress. She has no wheezes. She has no rhonchi. She has no rales.  GI: Soft. Bowel sounds are normal. There is no tenderness.  Musculoskeletal:       Right ankle: She exhibits no swelling.       Left ankle: She exhibits no swelling.  Lymphadenopathy:    She has no cervical adenopathy.  Neurological: She is alert and oriented to person, place,  and time. No cranial nerve deficit.  Skin: Skin is warm. Nails show no clubbing.  Patient states that she had smaller dots that are red and itchy that have settled down now that she placed some steroid cream on it. Noticed her face with some redness and itchiness also.  Psychiatric: She has a normal mood and affect.      Data Reviewed: Basic Metabolic Panel:  Recent Labs Lab 08/30/16 1715 08/31/16 0455 09/01/16 0608  NA 138 139 138  K 3.4* 3.9 3.8  CL 108 113* 114*  CO2 23 22 20*  GLUCOSE 106* 96 95  BUN 16 16 15   CREATININE 2.23* 2.10* 2.02*  CALCIUM 8.7* 7.6* 7.8*   Liver Function Tests:  Recent Labs Lab 08/30/16 1715  AST 26  ALT 18  ALKPHOS 75  BILITOT 0.3  PROT 7.2  ALBUMIN 2.7*    Recent Labs Lab 08/30/16 1715  LIPASE 18   CBC:  Recent Labs Lab 08/30/16 1715 08/31/16 0455  WBC 5.0 4.8  HGB 11.7* 10.1*  HCT 34.9* 30.0*  MCV 89.8 91.0  PLT 152 129*     Recent Results (from the past 240 hour(s))  Urine culture     Status: Abnormal   Collection Time: 08/30/16  5:15 PM  Result Value Ref Range Status   Specimen Description URINE, RANDOM  Final   Special Requests NONE  Final   Culture MULTIPLE SPECIES PRESENT, SUGGEST RECOLLECTION (A)  Final   Report  Status 09/01/2016 FINAL  Final  CULTURE, BLOOD (ROUTINE X 2) w Reflex to ID Panel     Status: None (Preliminary result)   Collection Time: 08/30/16  8:59 PM  Result Value Ref Range Status   Specimen Description BLOOD  LEFT AC  Final   Special Requests   Final    BOTTLES DRAWN AEROBIC AND ANAEROBIC  ANA 9ML AER 12ML   Culture NO GROWTH 2 DAYS  Final   Report Status PENDING  Incomplete  CULTURE, BLOOD (ROUTINE X 2) w Reflex to ID Panel     Status: None (Preliminary result)   Collection Time: 08/30/16  8:59 PM  Result Value Ref Range Status   Specimen Description BLOOD  LEFT WRIST  Final   Special Requests   Final    BOTTLES DRAWN AEROBIC AND ANAEROBIC  ANA 11ML AER 9ML   Culture NO GROWTH 2 DAYS   Final   Report Status PENDING  Incomplete     Studies: US Renal  Result Date: 08/31/2016 CLINICAL DATA:  Acute renal failure EXAM: RENAL / URINARY TRACT ULTRASOUND COMPLETE COMPARISON:  None. FINDINGS: Right Kidney: Length: 14.7 cm. No hydronephrosis is seen. However, there are multiple echogenic foci of most consistent with renal calculi. The largest shadowing calculus measures 4.0 cm in the midportion. There is a cyst in the medial midpole of 2.7 cm. The parenchyma of the right kidney is echogenic suggesting chronic renal medical disease. Left Kidney: Length: 12.9 cm. No echogenic foci are seen and there is no evidence of hydronephrosis. The parenchyma of the left kidney also is echogenic. Bladder: Any bladder is moderately well distended. Bilateral ureteral jets are visualized. IMPRESSION: 1. Multiple right renal calculi without hydronephrosis. No definite left renal calculi are seen. 2. Echogenic renal parenchyma consistent with chronic renal medical disease. 3. 2.7 cm cyst in the medial right kidney. Electronically Signed   By: Ivar Drape M.D.   On: 08/31/2016 10:56   Ct Renal Stone Study  Result Date: 08/31/2016 CLINICAL DATA:  Nephrolithiasis.  Nausea, vomiting, and rash. EXAM: CT ABDOMEN AND PELVIS WITHOUT CONTRAST TECHNIQUE: Multidetector CT imaging of the abdomen and pelvis was performed following the standard protocol without IV contrast. COMPARISON:  Renal ultrasound 08/31/2016 FINDINGS: Lower chest: The visualized lung bases are clear. Hepatobiliary: No focal liver abnormality is seen. No gallstones, gallbladder wall thickening, or biliary dilatation. Pancreas: Unremarkable. Spleen: Unremarkable. Adrenals/Urinary Tract: Unremarkable adrenal glands. There is a punctate 1 mm nonobstructing calculus in the interpolar left kidney. There is a large staghorn calculus in the right kidney involving the renal pelvis and lower pole calices and measuring greater than 4 cm in size. A few additional  smaller separate calculi are present in the right lower pole. There is mild right lower pole caliectasis as well as mild dilatation of the renal pelvis and/or mild adjacent soft tissue thickening/ inflammation. There is no ureteral dilatation. A 2.1 cm cyst is present in the interpolar right kidney. Stomach/Bowel: The stomach is within normal limits. There is no evidence of bowel obstruction. No gross bowel wall thickening or inflammation is identified within the limitations of underdistention, lack of oral contrast, and paucity of intra-abdominal fat. The appendix is unremarkable. Vascular/Lymphatic: Normal caliber of the abdominal aorta. Small para-aortic and paracaval lymph nodes as well as small bilateral inguinal lymph nodes, nonspecific but which may be reactive. Reproductive: Uterus and bilateral adnexa are unremarkable. Other: Small volume free fluid in the pelvis. Multiple pelvic phleboliths. No abdominal wall mass or hernia. Musculoskeletal:  No acute osseous abnormality or suspicious osseous lesion. IMPRESSION: 1. 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 2. Punctate nonobstructing left renal calculus. 3. Small volume pelvic free fluid, potentially physiologic. Electronically Signed   By: Logan Bores M.D.   On: 08/31/2016 16:25    Scheduled Meds: . cefTRIAXone  1 g Intravenous Q24H  . heparin  5,000 Units Subcutaneous Q8H  . metroNIDAZOLE   Topical BID  . sodium chloride flush  3 mL Intravenous Q12H  . triamcinolone cream   Topical BID   Continuous Infusions: . sodium chloride 75 mL/hr at 09/01/16 0612    Assessment/Plan:  1. Acute kidney injury. Continue IV fluid hydration. Case discussed with nephrology and He recommends continued IV fluid hydration and antibiotics for a few more days. 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 2. Acute cystitis with hematuria continue Rocephin. Send another urine culture  because first urine culture grew out contamination 3. Potential contact dermatitis with rash on body and face. I advised her to change her soap and detergent to free and clear brands. Steroid cream for the body. Metronidazole gel for the face. 4. Large staghorn nephrolithiasis right kidney. He'll need urology follow-up as outpatient  Code Status:     Code Status Orders        Start     Ordered   08/30/16 1913  Full code  Continuous     08/30/16 1913    Code Status History    Date Active Date Inactive Code Status Order ID Comments User Context   This patient has a current code status but no historical code status.     Disposition Plan: Nephrology recommended a few more days of IV antibiotics  Consultants:  Nephrology  Urology  Antibiotics:  Rocephin  Time spent: 24 minutes  Loletha Grayer  [employer]

## 2016-09-02 LAB — BASIC METABOLIC PANEL
ANION GAP: 3 — AB (ref 5–15)
BUN: 16 mg/dL (ref 6–20)
CALCIUM: 7.6 mg/dL — AB (ref 8.9–10.3)
CO2: 21 mmol/L — ABNORMAL LOW (ref 22–32)
CREATININE: 1.98 mg/dL — AB (ref 0.44–1.00)
Chloride: 114 mmol/L — ABNORMAL HIGH (ref 101–111)
GFR calc Af Amer: 37 mL/min — ABNORMAL LOW (ref >60)
GFR, EST NON AFRICAN AMERICAN: 32 mL/min — AB (ref >60)
GLUCOSE: 94 mg/dL (ref 65–99)
Potassium: 4.3 mmol/L (ref 3.5–5.1)
Sodium: 138 mmol/L (ref 135–145)

## 2016-09-02 LAB — THYROID PANEL WITH TSH
Free Thyroxine Index: 1.5 (ref 1.2–4.9)
T3 Uptake Ratio: 25 % (ref 24–39)
T4, Total: 6.1 ug/dL (ref 4.5–12.0)
TSH: 0.878 u[IU]/mL (ref 0.450–4.500)

## 2016-09-02 LAB — URINE CULTURE: CULTURE: NO GROWTH

## 2016-09-02 NOTE — Progress Notes (Signed)
Patient ID: Patricia Maldonado, female   DOB: 1980/12/01, 35 y.o.   MRN: [id number]   [employer] PROGRESS NOTE  MORENE CECILIO NGE:[id number] DOB: [birth year] DOA: 08/30/2016 PCP: No PCP Per Patient  HPI/Subjective: Patient feeling well. Offers no complaints. States she is urinating well. No pain anywhere. She is menstruating and now passing large clots.  Objective: Vitals:   09/02/16 0423 09/02/16 1309  BP: 117/77 117/81  Pulse: 80 90  Resp: 18 18  Temp: 98.6 F (37 C) 97.9 F (36.6 C)    Filed Weights   08/30/16 1709  Weight: 45.4 kg (100 lb)    ROS: Review of Systems  Constitutional: Negative for chills and fever.  Eyes: Negative for blurred vision.  Respiratory: Negative for cough and shortness of breath.   Cardiovascular: Negative for chest pain.  Gastrointestinal: Negative for abdominal pain, constipation, diarrhea, nausea and vomiting.  Genitourinary: Negative for dysuria.  Musculoskeletal: Negative for joint pain.  Neurological: Negative for dizziness and headaches.   Exam: Physical Exam  Constitutional: She is oriented to person, place, and time.  HENT:  Nose: No mucosal edema.  Mouth/Throat: No oropharyngeal exudate or posterior oropharyngeal edema.  Eyes: Conjunctivae, EOM and lids are normal. Pupils are equal, round, and reactive to light.  Neck: No JVD present. Carotid bruit is not present. No edema present. No thyroid mass and no thyromegaly present.  Cardiovascular: S1 normal and S2 normal.  Exam reveals no gallop.   No murmur heard. Pulses:      Dorsalis pedis pulses are 2+ on the right side, and 2+ on the left side.  Respiratory: No respiratory distress. She has no wheezes. She has no rhonchi. She has no rales.  GI: Soft. Bowel sounds are normal. There is no tenderness.  Musculoskeletal:       Right ankle: She exhibits no swelling.       Left ankle: She exhibits no swelling.  Lymphadenopathy:    She has no cervical adenopathy.  Neurological: She  is alert and oriented to person, place, and time. No cranial nerve deficit.  Skin: Skin is warm. Nails show no clubbing.  Patient states that she had smaller dots that are red and itchy that have settled down now that she placed some steroid cream on it. Noticed her face with some redness and itchiness also.  Psychiatric: She has a normal mood and affect.      Data Reviewed: Basic Metabolic Panel:  Recent Labs Lab 08/30/16 1715 08/31/16 0455 09/01/16 0608 09/02/16 0459  NA 138 139 138 138  K 3.4* 3.9 3.8 4.3  CL 108 113* 114* 114*  CO2 23 22 20* 21*  GLUCOSE 106* 96 95 94  BUN 16 16 15 16   CREATININE 2.23* 2.10* 2.02* 1.98*  CALCIUM 8.7* 7.6* 7.8* 7.6*   Liver Function Tests:  Recent Labs Lab 08/30/16 1715  AST 26  ALT 18  ALKPHOS 75  BILITOT 0.3  PROT 7.2  ALBUMIN 2.7*    Recent Labs Lab 08/30/16 1715  LIPASE 18   CBC:  Recent Labs Lab 08/30/16 1715 08/31/16 0455  WBC 5.0 4.8  HGB 11.7* 10.1*  HCT 34.9* 30.0*  MCV 89.8 91.0  PLT 152 129*     Recent Results (from the past 240 hour(s))  Urine culture     Status: Abnormal   Collection Time: 08/30/16  5:15 PM  Result Value Ref Range Status   Specimen Description URINE, RANDOM  Final   Special Requests NONE  Final  Culture MULTIPLE SPECIES PRESENT, SUGGEST RECOLLECTION (A)  Final   Report Status 09/01/2016 FINAL  Final  CULTURE, BLOOD (ROUTINE X 2) w Reflex to ID Panel     Status: None (Preliminary result)   Collection Time: 08/30/16  8:59 PM  Result Value Ref Range Status   Specimen Description BLOOD  LEFT AC  Final   Special Requests   Final    BOTTLES DRAWN AEROBIC AND ANAEROBIC  ANA 9ML AER 12ML   Culture NO GROWTH 3 DAYS  Final   Report Status PENDING  Incomplete  CULTURE, BLOOD (ROUTINE X 2) w Reflex to ID Panel     Status: None (Preliminary result)   Collection Time: 08/30/16  8:59 PM  Result Value Ref Range Status   Specimen Description BLOOD  LEFT WRIST  Final   Special Requests    Final    BOTTLES DRAWN AEROBIC AND ANAEROBIC  ANA 11ML AER 9ML   Culture NO GROWTH 3 DAYS  Final   Report Status PENDING  Incomplete  Urine culture     Status: None   Collection Time: 08/31/16  7:52 PM  Result Value Ref Range Status   Specimen Description URINE, CLEAN CATCH  Final   Special Requests NONE  Final   Culture NO GROWTH Performed at [facility]   Final   Report Status 09/02/2016 FINAL  Final      Scheduled Meds: . cefTRIAXone  1 g Intravenous Q24H  . metroNIDAZOLE   Topical BID  . sodium chloride flush  3 mL Intravenous Q12H  . triamcinolone cream   Topical BID   Continuous Infusions: . sodium chloride 50 mL/hr at 09/02/16 0902    Assessment/Plan:  1. Acute kidney injury. Continue IV fluid hydration. Case discussed with nephrology and He recommends continued IV fluid hydration and antibiotics through "Sunday. We'll need two weeks total of antibiotics. Repeat urine culture negative. 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 Creatinine very slow to improve. 1.98 today. 2. Acute cystitis with hematuria continue Rocephin. First urine culture contamination with multiple organisms. Second urine culture negative. Nephrology and urology recommended 2 weeks worth of antibiotics and IV antibiotics through Sunday 3. Potential contact dermatitis with rash on body and face. I advised her to change her soap and detergent to free and clear brands. Steroid cream for the body. Metronidazole gel for the face. 4. Large staghorn nephrolithiasis right kidney.  Urology follow-up as outpatient. 5. Discontinue heparin subcutaneous injections secondary to menstrual cycle and large clots.  Code Status:     Code Status Orders        Start     Ordered   08/30/16 1913  Full code  Continuous     11" /28/17 1913    Code Status History    Date Active Date Inactive Code Status Order ID Comments User Context   This patient has a current code status but no historical code  status.     Disposition Plan: Nephrology recommended a few more days of IV antibiotics  Consultants:  Nephrology  Urology  Antibiotics:  Rocephin  Time spent: 24 minutes  Loletha Grayer  [employer]

## 2016-09-02 NOTE — Progress Notes (Signed)
[facility]  ROUNDING NOTE   Subjective:  Creatinine slightly down to 1.98. Denies nausea and vomiting this a.m. Tolerated diet fairly well yesterday.   Objective:  Vital signs in last 24 hours:  Temp:  [98.4 F (36.9 C)-98.6 F (37 C)] 98.6 F (37 C) (12/01 0423) Pulse Rate:  [67-80] 80 (12/01 0423) Resp:  [18] 18 (12/01 0423) BP: (113-122)/(77-85) 117/77 (12/01 0423) SpO2:  [98 %-100 %] 98 % (12/01 0423)  Weight change:  Filed Weights   08/30/16 1709  Weight: 45.4 kg (100 lb)    Intake/Output: I/O last 3 completed shifts: In: 2355 [P.O.:720; I.V.:2693; IV Piggyback:50] Out: 1200 [Urine:1200]   Intake/Output this shift:  No intake/output data recorded.  Physical Exam: General: No acute distress  Head: Normocephalic, atraumatic. Moist oral mucosal membranes  Eyes: Anicteric  Neck: Supple, trachea midline  Lungs:  Clear to auscultation, normal effort  Heart: S1S2 no rubs  Abdomen:  Soft, nontender,   Extremities:  peripheral edema.  Neurologic: Nonfocal, moving all four extremities  Skin: Areas of facial hyperpigmentation       Basic Metabolic Panel:  Recent Labs Lab 08/30/16 1715 08/31/16 0455 09/01/16 0608 09/02/16 0459  NA 138 139 138 138  K 3.4* 3.9 3.8 4.3  CL 108 113* 114* 114*  CO2 23 22 20* 21*  GLUCOSE 106* 96 95 94  BUN 16 16 15 16   CREATININE 2.23* 2.10* 2.02* 1.98*  CALCIUM 8.7* 7.6* 7.8* 7.6*    Liver Function Tests:  Recent Labs Lab 08/30/16 1715  AST 26  ALT 18  ALKPHOS 75  BILITOT 0.3  PROT 7.2  ALBUMIN 2.7*    Recent Labs Lab 08/30/16 1715  LIPASE 18   No results for input(s): AMMONIA in the last 168 hours.  CBC:  Recent Labs Lab 08/30/16 1715 08/31/16 0455  WBC 5.0 4.8  HGB 11.7* 10.1*  HCT 34.9* 30.0*  MCV 89.8 91.0  PLT 152 129*    Cardiac Enzymes: No results for input(s): CKTOTAL, CKMB, CKMBINDEX, TROPONINI in the last 168 hours.  BNP: Invalid input(s): POCBNP  CBG: No results  for input(s): GLUCAP in the last 168 hours.  Microbiology: Results for orders placed or performed during the hospital encounter of 08/30/16  Urine culture     Status: Abnormal   Collection Time: 08/30/16  5:15 PM  Result Value Ref Range Status   Specimen Description URINE, RANDOM  Final   Special Requests NONE  Final   Culture MULTIPLE SPECIES PRESENT, SUGGEST RECOLLECTION (A)  Final   Report Status 09/01/2016 FINAL  Final  CULTURE, BLOOD (ROUTINE X 2) w Reflex to ID Panel     Status: None (Preliminary result)   Collection Time: 08/30/16  8:59 PM  Result Value Ref Range Status   Specimen Description BLOOD  LEFT AC  Final   Special Requests   Final    BOTTLES DRAWN AEROBIC AND ANAEROBIC  ANA 9ML AER 12ML   Culture NO GROWTH 3 DAYS  Final   Report Status PENDING  Incomplete  CULTURE, BLOOD (ROUTINE X 2) w Reflex to ID Panel     Status: None (Preliminary result)   Collection Time: 08/30/16  8:59 PM  Result Value Ref Range Status   Specimen Description BLOOD  LEFT WRIST  Final   Special Requests   Final    BOTTLES DRAWN AEROBIC AND ANAEROBIC  ANA 11ML AER 9ML   Culture NO GROWTH 3 DAYS  Final   Report Status PENDING  Incomplete  Coagulation Studies: No results for input(s): LABPROT, INR in the last 72 hours.  Urinalysis:  Recent Labs  08/30/16 1715  COLORURINE YELLOW*  LABSPEC 1.016  PHURINE 6.0  GLUCOSEU NEGATIVE  HGBUR 2+*  BILIRUBINUR NEGATIVE  KETONESUR NEGATIVE  PROTEINUR >500*  NITRITE POSITIVE*  LEUKOCYTESUR 3+*      Imaging: US Renal  Result Date: 08/31/2016 CLINICAL DATA:  Acute renal failure EXAM: RENAL / URINARY TRACT ULTRASOUND COMPLETE COMPARISON:  None. FINDINGS: Right Kidney: Length: 14.7 cm. No hydronephrosis is seen. However, there are multiple echogenic foci of most consistent with renal calculi. The largest shadowing calculus measures 4.0 cm in the midportion. There is a cyst in the medial midpole of 2.7 cm. The parenchyma of the right kidney is  echogenic suggesting chronic renal medical disease. Left Kidney: Length: 12.9 cm. No echogenic foci are seen and there is no evidence of hydronephrosis. The parenchyma of the left kidney also is echogenic. Bladder: Any bladder is moderately well distended. Bilateral ureteral jets are visualized. IMPRESSION: 1. Multiple right renal calculi without hydronephrosis. No definite left renal calculi are seen. 2. Echogenic renal parenchyma consistent with chronic renal medical disease. 3. 2.7 cm cyst in the medial right kidney. Electronically Signed   By: Ivar Drape M.D.   On: 08/31/2016 10:56   Ct Renal Stone Study  Result Date: 08/31/2016 CLINICAL DATA:  Nephrolithiasis.  Nausea, vomiting, and rash. EXAM: CT ABDOMEN AND PELVIS WITHOUT CONTRAST TECHNIQUE: Multidetector CT imaging of the abdomen and pelvis was performed following the standard protocol without IV contrast. COMPARISON:  Renal ultrasound 08/31/2016 FINDINGS: Lower chest: The visualized lung bases are clear. Hepatobiliary: No focal liver abnormality is seen. No gallstones, gallbladder wall thickening, or biliary dilatation. Pancreas: Unremarkable. Spleen: Unremarkable. Adrenals/Urinary Tract: Unremarkable adrenal glands. There is a punctate 1 mm nonobstructing calculus in the interpolar left kidney. There is a large staghorn calculus in the right kidney involving the renal pelvis and lower pole calices and measuring greater than 4 cm in size. A few additional smaller separate calculi are present in the right lower pole. There is mild right lower pole caliectasis as well as mild dilatation of the renal pelvis and/or mild adjacent soft tissue thickening/ inflammation. There is no ureteral dilatation. A 2.1 cm cyst is present in the interpolar right kidney. Stomach/Bowel: The stomach is within normal limits. There is no evidence of bowel obstruction. No gross bowel wall thickening or inflammation is identified within the limitations of underdistention, lack  of oral contrast, and paucity of intra-abdominal fat. The appendix is unremarkable. Vascular/Lymphatic: Normal caliber of the abdominal aorta. Small para-aortic and paracaval lymph nodes as well as small bilateral inguinal lymph nodes, nonspecific but which may be reactive. Reproductive: Uterus and bilateral adnexa are unremarkable. Other: Small volume free fluid in the pelvis. Multiple pelvic phleboliths. No abdominal wall mass or hernia. Musculoskeletal: No acute osseous abnormality or suspicious osseous lesion. IMPRESSION: 1. 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 2. Punctate nonobstructing left renal calculus. 3. Small volume pelvic free fluid, potentially physiologic. Electronically Signed   By: Logan Bores M.D.   On: 08/31/2016 16:25     Medications:   . sodium chloride 50 mL/hr at 09/02/16 6384   . cefTRIAXone  1 g Intravenous Q24H  . heparin  5,000 Units Subcutaneous Q8H  . metroNIDAZOLE   Topical BID  . sodium chloride flush  3 mL Intravenous Q12H  . triamcinolone cream   Topical BID   acetaminophen **OR** acetaminophen,  ondansetron **OR** ondansetron (ZOFRAN) IV, oxyCODONE  Assessment/ Plan:  35 y.o. female with a PMHX of prior urinary tract infection was admitted to [facility] on 08/30/2016 with nausea, vomiting, poor by mouth intake 2 weeks and subsequent he found to have acute renal failure. Patient also appears to have hematuria, pyuria, and proteinuria.  1. 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 Proteinuria. 3. Hematuria with pyuria. 4. Multiple right renal calculi without hydronephrosis.  Staghorn calculus noted.  Plan:  We continue to suspect that the patient has underlying glomerulonephritis. She had a high ASO titer and low complement levels. She also has associated hematuria and proteinuria. As before we would not recommend immediate renal  biopsy given the concern for staghorn calculus with underlying infection. She is to have staghorn calculus removed within the next week or 2. For now continue IV antibiotics. Recommend continuing this until Sunday and then she can be transitioned to by mouth antibiotics. Continue to monitor renal parameters in the interim. Okay to continue IV fluids to maintain good urinary flow.    LOS: 3 Patricia Maldonado 12/1/20178:57 AM

## 2016-09-03 LAB — BASIC METABOLIC PANEL
Anion gap: 4 — ABNORMAL LOW (ref 5–15)
BUN: 17 mg/dL (ref 6–20)
CALCIUM: 7.7 mg/dL — AB (ref 8.9–10.3)
CO2: 20 mmol/L — ABNORMAL LOW (ref 22–32)
CREATININE: 1.84 mg/dL — AB (ref 0.44–1.00)
Chloride: 113 mmol/L — ABNORMAL HIGH (ref 101–111)
GFR, EST AFRICAN AMERICAN: 41 mL/min — AB (ref >60)
GFR, EST NON AFRICAN AMERICAN: 35 mL/min — AB (ref >60)
Glucose, Bld: 92 mg/dL (ref 65–99)
Potassium: 4 mmol/L (ref 3.5–5.1)
SODIUM: 137 mmol/L (ref 135–145)

## 2016-09-03 LAB — CBC
HCT: 27.5 % — ABNORMAL LOW (ref 35.0–47.0)
HEMOGLOBIN: 9.2 g/dL — AB (ref 12.0–16.0)
MCH: 30.1 pg (ref 26.0–34.0)
MCHC: 33.7 g/dL (ref 32.0–36.0)
MCV: 89.5 fL (ref 80.0–100.0)
PLATELETS: 121 10*3/uL — AB (ref 150–440)
RBC: 3.07 MIL/uL — ABNORMAL LOW (ref 3.80–5.20)
RDW: 14.7 % — AB (ref 11.5–14.5)
WBC: 3.4 10*3/uL — ABNORMAL LOW (ref 3.6–11.0)

## 2016-09-03 MED ORDER — POLYETHYLENE GLYCOL 3350 17 G PO PACK
17.0000 g | PACK | Freq: Every day | ORAL | Status: DC
  Administered 2016-09-03: 17 g via ORAL
  Filled 2016-09-03: qty 1

## 2016-09-03 MED ORDER — DOCUSATE SODIUM 100 MG PO CAPS
200.0000 mg | ORAL_CAPSULE | Freq: Two times a day (BID) | ORAL | Status: DC
  Administered 2016-09-03: 200 mg via ORAL
  Filled 2016-09-03 (×2): qty 2

## 2016-09-03 MED ORDER — SENNA 8.6 MG PO TABS
1.0000 | ORAL_TABLET | Freq: Every day | ORAL | Status: DC
  Administered 2016-09-03: 8.6 mg via ORAL
  Filled 2016-09-03: qty 1

## 2016-09-03 NOTE — Progress Notes (Signed)
Patient ID: Patricia Maldonado, female   DOB: 03-Jan-1981, 35 y.o.   MRN: [id number]   [employer] [facility] PROGRESS NOTE  Patricia Maldonado WCB:[id number] DOB: 03-31-1981 DOA: 08/30/2016 PCP: No PCP Per Patient  HPI/Subjective: Complains of constipation but no other complaints  Objective: Vitals:   09/02/16 2020 09/03/16 0526  BP: 131/89 134/86  Pulse: 84 77  Resp: 19 17  Temp: 98 F (36.7 C) 98.7 F (37.1 C)    Filed Weights   08/30/16 1709 09/03/16 0526  Weight: 100 lb (45.4 kg) 104 lb 3.2 oz (47.3 kg)    ROS: Review of Systems  Constitutional: Negative for chills and fever.  Eyes: Negative for blurred vision.  Respiratory: Negative for cough and shortness of breath.   Cardiovascular: Negative for chest pain.  Gastrointestinal: Positive for constipation. Negative for abdominal pain, diarrhea, nausea and vomiting.  Genitourinary: Negative for dysuria.  Musculoskeletal: Negative for joint pain.  Neurological: Negative for dizziness and headaches.   Exam: Physical Exam  Constitutional: She is oriented to person, place, and time.  HENT:  Nose: No mucosal edema.  Mouth/Throat: No oropharyngeal exudate or posterior oropharyngeal edema.  Eyes: Conjunctivae, EOM and lids are normal. Pupils are equal, round, and reactive to light.  Neck: No JVD present. Carotid bruit is not present. No edema present. No thyroid mass and no thyromegaly present.  Cardiovascular: S1 normal and S2 normal.  Exam reveals no gallop.   No murmur heard. Pulses:      Dorsalis pedis pulses are 2+ on the right side, and 2+ on the left side.  Respiratory: No respiratory distress. She has no wheezes. She has no rhonchi. She has no rales.  GI: Soft. Bowel sounds are normal. There is no tenderness.  Musculoskeletal:       Right ankle: She exhibits no swelling.       Left ankle: She exhibits no swelling.  Lymphadenopathy:    She has no cervical adenopathy.  Neurological: She is alert and oriented to person, place,  and time. No cranial nerve deficit.  Skin: Skin is warm. Nails show no clubbing.  Patient states that she had smaller dots that are red and itchy that have settled down now that she placed some steroid cream on it. Noticed her face with some redness and itchiness also.  Psychiatric: She has a normal mood and affect.      Data Reviewed: Basic Metabolic Panel:  Recent Labs Lab 08/30/16 1715 08/31/16 0455 09/01/16 0608 09/02/16 0459 09/03/16 0607  NA 138 139 138 138 137  K 3.4* 3.9 3.8 4.3 4.0  CL 108 113* 114* 114* 113*  CO2 23 22 20* 21* 20*  GLUCOSE 106* 96 95 94 92  BUN 16 16 15 16 17   CREATININE 2.23* 2.10* 2.02* 1.98* 1.84*  CALCIUM 8.7* 7.6* 7.8* 7.6* 7.7*   Liver Function Tests:  Recent Labs Lab 08/30/16 1715  AST 26  ALT 18  ALKPHOS 75  BILITOT 0.3  PROT 7.2  ALBUMIN 2.7*    Recent Labs Lab 08/30/16 1715  LIPASE 18   CBC:  Recent Labs Lab 08/30/16 1715 08/31/16 0455 09/03/16 0607  WBC 5.0 4.8 3.4*  HGB 11.7* 10.1* 9.2*  HCT 34.9* 30.0* 27.5*  MCV 89.8 91.0 89.5  PLT 152 129* 121*     Recent Results (from the past 240 hour(s))  Urine culture     Status: Abnormal   Collection Time: 08/30/16  5:15 PM  Result Value Ref Range Status   Specimen  Description URINE, RANDOM  Final   Special Requests NONE  Final   Culture MULTIPLE SPECIES PRESENT, SUGGEST RECOLLECTION (A)  Final   Report Status 09/01/2016 FINAL  Final  CULTURE, BLOOD (ROUTINE X 2) w Reflex to ID Panel     Status: None (Preliminary result)   Collection Time: 08/30/16  8:59 PM  Result Value Ref Range Status   Specimen Description BLOOD  LEFT AC  Final   Special Requests   Final    BOTTLES DRAWN AEROBIC AND ANAEROBIC  ANA 9ML AER 12ML   Culture NO GROWTH 4 DAYS  Final   Report Status PENDING  Incomplete  CULTURE, BLOOD (ROUTINE X 2) w Reflex to ID Panel     Status: None (Preliminary result)   Collection Time: 08/30/16  8:59 PM  Result Value Ref Range Status   Specimen Description  BLOOD  LEFT WRIST  Final   Special Requests   Final    BOTTLES DRAWN AEROBIC AND ANAEROBIC  ANA 11ML AER 9ML   Culture NO GROWTH 4 DAYS  Final   Report Status PENDING  Incomplete  Urine culture     Status: None   Collection Time: 08/31/16  7:52 PM  Result Value Ref Range Status   Specimen Description URINE, CLEAN CATCH  Final   Special Requests NONE  Final   Culture NO GROWTH Performed at [facility]   Final   Report Status 09/02/2016 FINAL  Final      Scheduled Meds: . cefTRIAXone  1 g Intravenous Q24H  . docusate sodium  200 mg Oral BID  . metroNIDAZOLE   Topical BID  . polyethylene glycol  17 g Oral Daily  . senna  1 tablet Oral Daily  . sodium chloride flush  3 mL Intravenous Q12H  . triamcinolone cream   Topical BID   Continuous Infusions: . sodium chloride 50 mL/hr at 09/03/16 0803    Assessment/Plan:  1. Acute kidney injury. Continue IV fluid hydration. Case discussed with nephrology and He recommends continued IV fluid hydration and antibiotics through "Sunday. We'll need two weeks total of antibiotics. Repeat urine culture negative. 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 Creatinine very slow to improve. 1.98 today. 2. Acute cystitis with hematuria continue Rocephin. First urine culture contamination with multiple organisms. Second urine culture negative. Nephrology and urology recommended 2 weeks worth of antibiotics and IV antibiotics through Sunday 3. Potential contact dermatitis with rash on body and face. I advised her to change her soap and detergent to free and clear brands. Steroid cream for the body. Metronidazole gel for the face. 4. Large staghorn nephrolithiasis right kidney.  Urology follow-up as outpatient. 5. Discontinue heparin subcutaneous injections secondary to menstrual cycle and large clots.  Code Status:     Code Status Orders        Start     Ordered   08/30/16 1913  Full code  Continuous     11" /28/17 1913    Code  Status History    Date Active Date Inactive Code Status Order ID Comments User Context   This patient has a current code status but no historical code status.     Disposition Plan: Nephrology recommended a few more days of IV antibiotics  Consultants:  Nephrology  Urology  Antibiotics:  Rocephin  Time spent: 24 minutes  [place]            Patient ID: Patricia Maldonado, female   DOB: 1981/03/07, 35 y.o.  MRN: [id number]   [employer] PROGRESS NOTE  Patricia Maldonado WYO:[id number] DOB: December 31, 1980 DOA: 08/30/2016 PCP: No PCP Per Patient  HPI/Subjective: Patient feeling well. Offers no complaints. States she is urinating well. No pain anywhere. She is menstruating and now passing large clots.  Objective: Vitals:   09/02/16 2020 09/03/16 0526  BP: 131/89 134/86  Pulse: 84 77  Resp: 19 17  Temp: 98 F (36.7 C) 98.7 F (37.1 C)    Filed Weights   08/30/16 1709 09/03/16 0526  Weight: 100 lb (45.4 kg) 104 lb 3.2 oz (47.3 kg)    ROS: Review of Systems  Constitutional: Negative for chills and fever.  Eyes: Negative for blurred vision.  Respiratory: Negative for cough and shortness of breath.   Cardiovascular: Negative for chest pain.  Gastrointestinal: Negative for abdominal pain, constipation, diarrhea, nausea and vomiting.  Genitourinary: Negative for dysuria.  Musculoskeletal: Negative for joint pain.  Neurological: Negative for dizziness and headaches.   Exam: Physical Exam  Constitutional: She is oriented to person, place, and time.  HENT:  Nose: No mucosal edema.  Mouth/Throat: No oropharyngeal exudate or posterior oropharyngeal edema.  Eyes: Conjunctivae, EOM and lids are normal. Pupils are equal, round, and reactive to light.  Neck: No JVD present. Carotid bruit is not present. No edema present. No thyroid mass and no thyromegaly present.  Cardiovascular: S1 normal and S2 normal.  Exam reveals no gallop.   No murmur  heard. Pulses:      Dorsalis pedis pulses are 2+ on the right side, and 2+ on the left side.  Respiratory: No respiratory distress. She has no wheezes. She has no rhonchi. She has no rales.  GI: Soft. Bowel sounds are normal. There is no tenderness.  Musculoskeletal:       Right ankle: She exhibits no swelling.       Left ankle: She exhibits no swelling.  Lymphadenopathy:    She has no cervical adenopathy.  Neurological: She is alert and oriented to person, place, and time. No cranial nerve deficit.  Skin: Skin is warm. Nails show no clubbing.  Patient states that she had smaller dots that are red and itchy that have settled down now that she placed some steroid cream on it. Noticed her face with some redness and itchiness also.  Psychiatric: She has a normal mood and affect.      Data Reviewed: Basic Metabolic Panel:  Recent Labs Lab 08/30/16 1715 08/31/16 0455 09/01/16 0608 09/02/16 0459 09/03/16 0607  NA 138 139 138 138 137  K 3.4* 3.9 3.8 4.3 4.0  CL 108 113* 114* 114* 113*  CO2 23 22 20* 21* 20*  GLUCOSE 106* 96 95 94 92  BUN 16 16 15 16 17   CREATININE 2.23* 2.10* 2.02* 1.98* 1.84*  CALCIUM 8.7* 7.6* 7.8* 7.6* 7.7*   Liver Function Tests:  Recent Labs Lab 08/30/16 1715  AST 26  ALT 18  ALKPHOS 75  BILITOT 0.3  PROT 7.2  ALBUMIN 2.7*    Recent Labs Lab 08/30/16 1715  LIPASE 18   CBC:  Recent Labs Lab 08/30/16 1715 08/31/16 0455 09/03/16 0607  WBC 5.0 4.8 3.4*  HGB 11.7* 10.1* 9.2*  HCT 34.9* 30.0* 27.5*  MCV 89.8 91.0 89.5  PLT 152 129* 121*     Recent Results (from the past 240 hour(s))  Urine culture     Status: Abnormal   Collection Time: 08/30/16  5:15 PM  Result Value Ref Range Status   Specimen Description  URINE, RANDOM  Final   Special Requests NONE  Final   Culture MULTIPLE SPECIES PRESENT, SUGGEST RECOLLECTION (A)  Final   Report Status 09/01/2016 FINAL  Final  CULTURE, BLOOD (ROUTINE X 2) w Reflex to ID Panel     Status: None  (Preliminary result)   Collection Time: 08/30/16  8:59 PM  Result Value Ref Range Status   Specimen Description BLOOD  LEFT AC  Final   Special Requests   Final    BOTTLES DRAWN AEROBIC AND ANAEROBIC  ANA 9ML AER 12ML   Culture NO GROWTH 4 DAYS  Final   Report Status PENDING  Incomplete  CULTURE, BLOOD (ROUTINE X 2) w Reflex to ID Panel     Status: None (Preliminary result)   Collection Time: 08/30/16  8:59 PM  Result Value Ref Range Status   Specimen Description BLOOD  LEFT WRIST  Final   Special Requests   Final    BOTTLES DRAWN AEROBIC AND ANAEROBIC  ANA 11ML AER 9ML   Culture NO GROWTH 4 DAYS  Final   Report Status PENDING  Incomplete  Urine culture     Status: None   Collection Time: 08/31/16  7:52 PM  Result Value Ref Range Status   Specimen Description URINE, CLEAN CATCH  Final   Special Requests NONE  Final   Culture NO GROWTH Performed at [facility]   Final   Report Status 09/02/2016 FINAL  Final      Scheduled Meds: . cefTRIAXone  1 g Intravenous Q24H  . docusate sodium  200 mg Oral BID  . metroNIDAZOLE   Topical BID  . polyethylene glycol  17 g Oral Daily  . senna  1 tablet Oral Daily  . sodium chloride flush  3 mL Intravenous Q12H  . triamcinolone cream   Topical BID   Continuous Infusions: . sodium chloride 50 mL/hr at 09/03/16 0803    Assessment/Plan:  6. Acute kidney injury. Continue IV fluid hydration.Creatinine slow to improve 7. Acute cystitis with hematuria continue Rocephin. First urine culture contamination with multiple organisms. Second urine culture negative. Nephrology and urology recommended 2 weeks worth of antibiotics one more day of IV antibiotics 8. Potential contact dermatitis with rash on body and face. Patient may need outpatient dermatology evaluation 9. Large staghorn nephrolithiasis right kidney.  Urology follow-up as outpatient. 10. Discontinue heparin subcutaneous injections secondary to menstrual cycle and large  clots.  Code Status:     Code Status Orders        Start     Ordered   08/30/16 1913  Full code  Continuous     08/30/16 1913    Code Status History    Date Active Date Inactive Code Status Order ID Comments User Context   This patient has a current code status but no historical code status.     Disposition Plan: Nephrology recommended a few more days of IV antibiotics  Consultants:  Nephrology  Urology  Antibiotics:  Rocephin  Time spent: 24 minutes  [place], [place] [facility]

## 2016-09-03 NOTE — Progress Notes (Signed)
[facility]  ROUNDING NOTE   Subjective:  Renal function actually appears to be improving. Creatinine down to 1.8. Patient appears to be in good spirits. Good urine output noted.  Objective:  Vital signs in last 24 hours:  Temp:  [97.9 F (36.6 C)-98.7 F (37.1 C)] 98.7 F (37.1 C) (12/02 0526) Pulse Rate:  [77-90] 77 (12/02 0526) Resp:  [17-19] 17 (12/02 0526) BP: (117-134)/(81-89) 134/86 (12/02 0526) SpO2:  [100 %] 100 % (12/02 0526) Weight:  [47.3 kg (104 lb 3.2 oz)] 47.3 kg (104 lb 3.2 oz) (12/02 0526)  Weight change:  Filed Weights   08/30/16 1709 09/03/16 0526  Weight: 45.4 kg (100 lb) 47.3 kg (104 lb 3.2 oz)    Intake/Output: I/O last 3 completed shifts: In: 2540 [P.O.:438; I.V.:2052; IV Piggyback:50] Out: 3300 [Urine:3300]   Intake/Output this shift:  No intake/output data recorded.  Physical Exam: General: No acute distress  Head: Normocephalic, atraumatic. Moist oral mucosal membranes  Eyes: Anicteric  Neck: Supple, trachea midline  Lungs:  Clear to auscultation, normal effort  Heart: S1S2 no rubs  Abdomen:  Soft, nontender,   Extremities:  peripheral edema.  Neurologic: Nonfocal, moving all four extremities  Skin: Areas of facial hyperpigmentation       Basic Metabolic Panel:  Recent Labs Lab 08/30/16 1715 08/31/16 0455 09/01/16 0608 09/02/16 0459 09/03/16 0607  NA 138 139 138 138 137  K 3.4* 3.9 3.8 4.3 4.0  CL 108 113* 114* 114* 113*  CO2 23 22 20* 21* 20*  GLUCOSE 106* 96 95 94 92  BUN 16 16 15 16 17   CREATININE 2.23* 2.10* 2.02* 1.98* 1.84*  CALCIUM 8.7* 7.6* 7.8* 7.6* 7.7*    Liver Function Tests:  Recent Labs Lab 08/30/16 1715  AST 26  ALT 18  ALKPHOS 75  BILITOT 0.3  PROT 7.2  ALBUMIN 2.7*    Recent Labs Lab 08/30/16 1715  LIPASE 18   No results for input(s): AMMONIA in the last 168 hours.  CBC:  Recent Labs Lab 08/30/16 1715 08/31/16 0455 09/03/16 0607  WBC 5.0 4.8 3.4*  HGB 11.7* 10.1*  9.2*  HCT 34.9* 30.0* 27.5*  MCV 89.8 91.0 89.5  PLT 152 129* 121*    Cardiac Enzymes: No results for input(s): CKTOTAL, CKMB, CKMBINDEX, TROPONINI in the last 168 hours.  BNP: Invalid input(s): POCBNP  CBG: No results for input(s): GLUCAP in the last 168 hours.  Microbiology: Results for orders placed or performed during the hospital encounter of 08/30/16  Urine culture     Status: Abnormal   Collection Time: 08/30/16  5:15 PM  Result Value Ref Range Status   Specimen Description URINE, RANDOM  Final   Special Requests NONE  Final   Culture MULTIPLE SPECIES PRESENT, SUGGEST RECOLLECTION (A)  Final   Report Status 09/01/2016 FINAL  Final  CULTURE, BLOOD (ROUTINE X 2) w Reflex to ID Panel     Status: None (Preliminary result)   Collection Time: 08/30/16  8:59 PM  Result Value Ref Range Status   Specimen Description BLOOD  LEFT AC  Final   Special Requests   Final    BOTTLES DRAWN AEROBIC AND ANAEROBIC  ANA 9ML AER 12ML   Culture NO GROWTH 4 DAYS  Final   Report Status PENDING  Incomplete  CULTURE, BLOOD (ROUTINE X 2) w Reflex to ID Panel     Status: None (Preliminary result)   Collection Time: 08/30/16  8:59 PM  Result Value Ref Range Status  Specimen Description BLOOD  LEFT WRIST  Final   Special Requests   Final    BOTTLES DRAWN AEROBIC AND ANAEROBIC  ANA 11ML AER 9ML   Culture NO GROWTH 4 DAYS  Final   Report Status PENDING  Incomplete  Urine culture     Status: None   Collection Time: 08/31/16  7:52 PM  Result Value Ref Range Status   Specimen Description URINE, CLEAN CATCH  Final   Special Requests NONE  Final   Culture NO GROWTH Performed at [facility]   Final   Report Status 09/02/2016 FINAL  Final    Coagulation Studies: No results for input(s): LABPROT, INR in the last 72 hours.  Urinalysis: No results for input(s): COLORURINE, LABSPEC, PHURINE, GLUCOSEU, HGBUR, BILIRUBINUR, KETONESUR, PROTEINUR, UROBILINOGEN, NITRITE, LEUKOCYTESUR in the last  72 hours.  Invalid input(s): APPERANCEUR    Imaging: No results found.   Medications:   . sodium chloride 50 mL/hr at 09/03/16 0803   . cefTRIAXone  1 g Intravenous Q24H  . docusate sodium  200 mg Oral BID  . metroNIDAZOLE   Topical BID  . polyethylene glycol  17 g Oral Daily  . senna  1 tablet Oral Daily  . sodium chloride flush  3 mL Intravenous Q12H  . triamcinolone cream   Topical BID   acetaminophen **OR** acetaminophen, ondansetron **OR** ondansetron (ZOFRAN) IV, oxyCODONE  Assessment/ Plan:  35 y.o. female with a PMHX of prior urinary tract infection was admitted to [facility] on 08/30/2016 with nausea, vomiting, poor by mouth intake 2 weeks and subsequent he found to have acute renal failure. Patient also appears to have hematuria, pyuria, and proteinuria.  1. 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 Proteinuria. 3. Hematuria with pyuria. 4. Multiple right renal calculi without hydronephrosis.  Staghorn calculus noted.  Plan:  Clinically the patient appears to be doing well. The current plan has been to administer intravenous antibiotics through Sunday. She can be transitioned to by mouth antibiotics upon discharge. She should receive a dose of IV ceftriaxone tomorrow. Renal function appears to be overall relatively stable despite improved as creatinine has come down to 1.8. As before she will need nephrolithotomy to remove the right-sided staghorn calculus. This may be resulting in a postinfectious glomerulonephritis. She may end up needing renal biopsy however now is not the optimal time given acute infection. We plan to see the patient back in the office in the next week or 2. Anticipate discharge tomorrow.    LOS: [place], Jodel Mayhall 12/2/201712:54 PM

## 2016-09-04 LAB — CULTURE, BLOOD (ROUTINE X 2)
CULTURE: NO GROWTH
CULTURE: NO GROWTH

## 2016-09-04 MED ORDER — CEFUROXIME AXETIL 500 MG PO TABS: 500.0000 mg | ORAL_TABLET | Freq: Two times a day (BID) | ORAL | 0 refills | Status: AC

## 2016-09-04 MED ORDER — TRIAMCINOLONE ACETONIDE 0.1 % EX CREA: TOPICAL_CREAM | Freq: Two times a day (BID) | CUTANEOUS | 0 refills | Status: DC

## 2016-09-04 MED ORDER — CEFUROXIME AXETIL 500 MG PO TABS: 500.0000 mg | ORAL_TABLET | Freq: Two times a day (BID) | ORAL | 0 refills | Status: DC

## 2016-09-04 MED ORDER — METRONIDAZOLE 0.75 % EX CREA: TOPICAL_CREAM | Freq: Two times a day (BID) | CUTANEOUS | 0 refills | Status: DC

## 2016-09-04 NOTE — Discharge Summary (Signed)
Patricia Maldonado, Patricia Maldonado y.o., DOB March 11, 1981, MRN [id number]. Admission date: 08/30/2016 Discharge Date 09/04/2016 Primary MD No PCP Per Patient Admitting Physician Lytle Butte, MD  Admission Diagnosis  fever nausea vomiting  Discharge Diagnosis   Active Problems:   Acute renal failure with tubular necrosis [facility])   Nephrolithiasis   Staghorn calculus   Abnormal ANA   Patricia Maldonado  is a 35 y.o. female without significant medical history who is presenting with fever and chills. States she's had about 1 week duration of nausea with vomiting nonbloody nonbilious emesis and watery diarrhea, weakness fatigue, poor by mouth intake. Patient was evaluated and was noted to have acute renal failure and a UTI. Her acute kidney injury was felt to be due to dehydration and UTI. Patient was aggressively hydrated renal function continues to improve. She'll need outpatient nephrology follow-up. 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 Urology saw her and recommended that she would need 2 weeks of oral antibiotics. And then she'll need outpatient intervention. Patient also had a rash on her face and back which was thought to be due to possible contact dermatitis however she needs outpatient dermatology evaluation and a biopsy. She also had a ANA labs that were drawn which were abnormal she'll need outpatient rheumatology evaluation for further therapy and evaluation.            Consults  nephrology and urology  Significant Tests:  See full reports for all details     US Renal  Result Date: 08/31/2016 CLINICAL DATA:  Acute renal failure EXAM: RENAL / URINARY TRACT ULTRASOUND COMPLETE COMPARISON:  None. FINDINGS: Right Kidney: Length: 14.7 cm. No hydronephrosis is seen. However, there are multiple echogenic foci of most consistent with renal calculi. The largest shadowing  calculus measures 4.0 cm in the midportion. There is a cyst in the medial midpole of 2.7 cm. The parenchyma of the right kidney is echogenic suggesting chronic renal medical disease. Left Kidney: Length: 12.9 cm. No echogenic foci are seen and there is no evidence of hydronephrosis. The parenchyma of the left kidney also is echogenic. Bladder: Any bladder is moderately well distended. Bilateral ureteral jets are visualized. IMPRESSION: 1. Multiple right renal calculi without hydronephrosis. No definite left renal calculi are seen. 2. Echogenic renal parenchyma consistent with chronic renal medical disease. 3. 2.7 cm cyst in the medial right kidney. Electronically Signed   By: Ivar Drape M.D.   On: 08/31/2016 10:56   Ct Renal Stone Study  Result Date: 08/31/2016 CLINICAL DATA:  Nephrolithiasis.  Nausea, vomiting, and rash. EXAM: CT ABDOMEN AND PELVIS WITHOUT CONTRAST TECHNIQUE: Multidetector CT imaging of the abdomen and pelvis was performed following the standard protocol without IV contrast. COMPARISON:  Renal ultrasound 08/31/2016 FINDINGS: Lower chest: The visualized lung bases are clear. Hepatobiliary: No focal liver abnormality is seen. No gallstones, gallbladder wall thickening, or biliary dilatation. Pancreas: Unremarkable. Spleen: Unremarkable. Adrenals/Urinary Tract: Unremarkable adrenal glands. There is a punctate 1 mm nonobstructing calculus in the interpolar left kidney. There is a large staghorn calculus in the right kidney involving the renal pelvis and lower pole calices and measuring greater than 4 cm in size. A few additional smaller separate calculi are present in the right lower pole. There is mild right lower pole caliectasis as well as mild dilatation of the renal pelvis and/or mild adjacent soft tissue thickening/  inflammation. There is no ureteral dilatation. A 2.1 cm cyst is present in the interpolar right kidney. Stomach/Bowel: The stomach is within normal limits. There is no evidence  of bowel obstruction. No gross bowel wall thickening or inflammation is identified within the limitations of underdistention, lack of oral contrast, and paucity of intra-abdominal fat. The appendix is unremarkable. Vascular/Lymphatic: Normal caliber of the abdominal aorta. Small para-aortic and paracaval lymph nodes as well as small bilateral inguinal lymph nodes, nonspecific but which may be reactive. Reproductive: Uterus and bilateral adnexa are unremarkable. Other: Small volume free fluid in the pelvis. Multiple pelvic phleboliths. No abdominal wall mass or hernia. Musculoskeletal: No acute osseous abnormality or suspicious osseous lesion. IMPRESSION: 1. 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 2. Punctate nonobstructing left renal calculus. 3. Small volume pelvic free fluid, potentially physiologic. Electronically Signed   By: Logan Bores M.D.   On: 08/31/2016 16:25       Today   Subjective:   Patricia Maldonado  patient feeling well denies any complaints  Objective:   Blood pressure 117/87, pulse 87, temperature 98.2 F (36.8 C), temperature source Oral, resp. rate 18, height 5\' 2"  (1.575 m), weight 103 lb 12.8 oz (47.1 kg), last menstrual period 08/09/2016, SpO2 100 %.  .  Intake/Output Summary (Last 24 hours) at 09/04/16 1312 Last data filed at 09/04/16 1038  Gross per 24 hour  Intake          1466.63 ml  Output             2100 ml  Net          -633.37 ml    Exam VITAL SIGNS: Blood pressure 117/87, pulse 87, temperature 98.2 F (36.8 C), temperature source Oral, resp. rate 18, height 5\' 2"  (1.575 m), weight 103 lb 12.8 oz (47.1 kg), last menstrual period 08/09/2016, SpO2 100 %.  GENERAL:  35 y.o.-year-old patient lying in the bed with no acute distress.  EYES: Pupils equal, round, reactive to light and accommodation. No scleral icterus. Extraocular muscles intact.  HEENT: Head atraumatic, normocephalic. Oropharynx and  nasopharynx clear.  NECK:  Supple, no jugular venous distention. No thyroid enlargement, no tenderness.  LUNGS: Normal breath sounds bilaterally, no wheezing, rales,rhonchi or crepitation. No use of accessory muscles of respiration.  CARDIOVASCULAR: S1, S2 normal. No murmurs, rubs, or gallops.  ABDOMEN: Soft, nontender, nondistended. Bowel sounds present. No organomegaly or mass.  EXTREMITIES: No pedal edema, cyanosis, or clubbing.  NEUROLOGIC: Cranial nerves II through XII are intact. Muscle strength 5/5 in all extremities. Sensation intact. Gait not checked.  PSYCHIATRIC: The patient is alert and oriented x 3.  SKIN:  She is noted to have dermatitis on her face and her back.  Data Review     CBC w Diff: Lab Results  Component Value Date   WBC 3.4 (L) 09/03/2016   HGB 9.2 (L) 09/03/2016   HCT 27.5 (L) 09/03/2016   PLT 121 (L) 09/03/2016   CMP: Lab Results  Component Value Date   NA 137 09/03/2016   K 4.0 09/03/2016   CL 113 (H) 09/03/2016   CO2 20 (L) 09/03/2016   BUN 17 09/03/2016   CREATININE 1.84 (H) 09/03/2016   PROT 7.2 08/30/2016   ALBUMIN 2.7 (L) 08/30/2016   BILITOT 0.3 08/30/2016   ALKPHOS 75 08/30/2016   AST 26 08/30/2016   ALT 18 08/30/2016  .  Micro Results Recent Results (from the past 240 hour(s))  Urine culture  Status: Abnormal   Collection Time: 08/30/16  5:15 PM  Result Value Ref Range Status   Specimen Description URINE, RANDOM  Final   Special Requests NONE  Final   Culture MULTIPLE SPECIES PRESENT, SUGGEST RECOLLECTION (A)  Final   Report Status 09/01/2016 FINAL  Final  CULTURE, BLOOD (ROUTINE X 2) w Reflex to ID Panel     Status: None   Collection Time: 08/30/16  8:59 PM  Result Value Ref Range Status   Specimen Description BLOOD  LEFT AC  Final   Special Requests   Final    BOTTLES DRAWN AEROBIC AND ANAEROBIC  ANA 9ML AER 12ML   Culture NO GROWTH 5 DAYS  Final   Report Status 09/04/2016 FINAL  Final  CULTURE, BLOOD (ROUTINE X 2) w  Reflex to ID Panel     Status: None   Collection Time: 08/30/16  8:59 PM  Result Value Ref Range Status   Specimen Description BLOOD  LEFT WRIST  Final   Special Requests   Final    BOTTLES DRAWN AEROBIC AND ANAEROBIC  ANA 11ML AER 9ML   Culture NO GROWTH 5 DAYS  Final   Report Status 09/04/2016 FINAL  Final  Urine culture     Status: None   Collection Time: 08/31/16  7:52 PM  Result Value Ref Range Status   Specimen Description URINE, CLEAN CATCH  Final   Special Requests NONE  Final   Culture NO GROWTH Performed at [facility]   Final   Report Status 09/02/2016 FINAL  Final        Code Status Orders        Start     Ordered   08/30/16 1913  Full code  Continuous     08/30/16 1913    Code Status History    Date Active Date Inactive Code Status Order ID Comments User Context   This patient has a current code status but no historical code status.          Follow-up Information    LATEEF, MUNSOOR, MD Follow up in 2 week(s).   Specialty:  Internal Medicine Why:  Ann Held information: [place] [address] [phone]        [name], MD Follow up in 2 week(s).   Specialty:  Urology Contact information: [place] [address] [phone]        [facility] for facial rash Follow up in 3 week(s).        [name], MD Follow up in 2 week(s).   Specialty:  Rheumatology Why:  abnormal ana Contact information: [address] [id number] [phone]           Discharge Medications     Medication List    TAKE these medications   cefUROXime 500 MG tablet Commonly known as:  CEFTIN Take 1 tablet (500 mg total) by mouth 2 (two) times daily with a meal.   metroNIDAZOLE 0.75 % cream Commonly known as:  METROCREAM Apply topically 2 (two) times daily.   triamcinolone cream 0.1 % Commonly known as:  KENALOG Apply  topically 2 (two) times daily.          Total Time in preparing paper work, data evaluation and todays exam - 35 minutes  Dustin Flock M.D on 09/04/2016 at 1:12 PM  [facility]   Office  [phone]

## 2016-09-04 NOTE — Discharge Instructions (Signed)
[place] [facility] [place]:  Regular diet  DISCHARGE CONDITION:  Stable  ACTIVITY:  Activity as tolerated  OXYGEN:  Home Oxygen: No.   Oxygen Delivery: room air  DISCHARGE LOCATION:  home    ADDITIONAL DISCHARGE INSTRUCTION:drink much fluid as possible for next one week   If you experience worsening of your admission symptoms, develop shortness of breath, life threatening emergency, suicidal or homicidal thoughts you must seek medical attention immediately by calling 911 or calling your MD immediately  if symptoms less severe.  You Must read complete instructions/literature along with all the possible adverse reactions/side effects for all the Medicines you take and that have been prescribed to you. Take any new Medicines after you have completely understood and accpet all the possible adverse reactions/side effects.   Please note  You were cared for by a hospitalist during your hospital stay. If you have any questions about your discharge medications or the care you received while you were in the hospital after you are discharged, you can call the unit and asked to speak with the hospitalist on call if the hospitalist that took care of you is not available. Once you are discharged, your primary care physician will handle any further medical issues. Please note that NO REFILLS for any discharge medications will be authorized once you are discharged, as it is imperative that you return to your primary care physician (or establish a relationship with a primary care physician if you do not have one) for your aftercare needs so that they can reassess your need for medications and monitor your lab values.

## 2016-09-05 LAB — PROTEIN ELECTRO, RANDOM URINE
Albumin ELP, Urine: 60.1 %
Alpha-1-Globulin, U: 5.8 %
Alpha-2-Globulin, U: 5.4 %
Beta Globulin, U: 10.2 %
Gamma Globulin, U: 18.5 %
PDF-U24IEL: 0
Total Protein, Urine: 527.4 mg/dL

## 2016-10-03 DIAGNOSIS — M329 Systemic lupus erythematosus, unspecified: Secondary | ICD-10-CM

## 2016-10-03 HISTORY — DX: Systemic lupus erythematosus, unspecified: M32.9

## 2016-10-04 ENCOUNTER — Inpatient Hospital Stay
Admission: EM | Admit: 2016-10-04 | Discharge: 2016-10-12 | DRG: 545 | Disposition: A | Discharge status: Home / Self Care | Payer: Medicaid Other | Attending: Internal Medicine | Admitting: Internal Medicine

## 2016-10-04 ENCOUNTER — Emergency Department: Payer: Medicaid Other

## 2016-10-04 ENCOUNTER — Encounter: Payer: Self-pay | Admitting: *Deleted

## 2016-10-04 DIAGNOSIS — F1290 Cannabis use, unspecified, uncomplicated: Secondary | ICD-10-CM | POA: Diagnosis present

## 2016-10-04 DIAGNOSIS — E875 Hyperkalemia: Secondary | ICD-10-CM | POA: Diagnosis not present

## 2016-10-04 DIAGNOSIS — Z6821 Body mass index (BMI) 21.0-21.9, adult: Secondary | ICD-10-CM | POA: Diagnosis not present

## 2016-10-04 DIAGNOSIS — D638 Anemia in other chronic diseases classified elsewhere: Secondary | ICD-10-CM | POA: Diagnosis present

## 2016-10-04 DIAGNOSIS — E860 Dehydration: Secondary | ICD-10-CM | POA: Diagnosis present

## 2016-10-04 DIAGNOSIS — R809 Proteinuria, unspecified: Secondary | ICD-10-CM

## 2016-10-04 DIAGNOSIS — K5900 Constipation, unspecified: Secondary | ICD-10-CM | POA: Diagnosis not present

## 2016-10-04 DIAGNOSIS — F17210 Nicotine dependence, cigarettes, uncomplicated: Secondary | ICD-10-CM | POA: Diagnosis present

## 2016-10-04 DIAGNOSIS — E43 Unspecified severe protein-calorie malnutrition: Secondary | ICD-10-CM | POA: Diagnosis present

## 2016-10-04 DIAGNOSIS — R1110 Vomiting, unspecified: Secondary | ICD-10-CM | POA: Diagnosis present

## 2016-10-04 DIAGNOSIS — K219 Gastro-esophageal reflux disease without esophagitis: Secondary | ICD-10-CM | POA: Diagnosis present

## 2016-10-04 DIAGNOSIS — Z87442 Personal history of urinary calculi: Secondary | ICD-10-CM

## 2016-10-04 DIAGNOSIS — D6959 Other secondary thrombocytopenia: Secondary | ICD-10-CM | POA: Diagnosis not present

## 2016-10-04 DIAGNOSIS — N200 Calculus of kidney: Secondary | ICD-10-CM | POA: Diagnosis present

## 2016-10-04 DIAGNOSIS — Z9119 Patient's noncompliance with other medical treatment and regimen: Secondary | ICD-10-CM

## 2016-10-04 DIAGNOSIS — Z87440 Personal history of urinary (tract) infections: Secondary | ICD-10-CM

## 2016-10-04 DIAGNOSIS — N179 Acute kidney failure, unspecified: Secondary | ICD-10-CM | POA: Diagnosis present

## 2016-10-04 DIAGNOSIS — M3214 Glomerular disease in systemic lupus erythematosus: Secondary | ICD-10-CM | POA: Diagnosis present

## 2016-10-04 DIAGNOSIS — R1111 Vomiting without nausea: Secondary | ICD-10-CM

## 2016-10-04 HISTORY — DX: Calculus of kidney: N20.0

## 2016-10-04 HISTORY — DX: Rash and other nonspecific skin eruption: R21

## 2016-10-04 HISTORY — DX: Acute kidney failure, unspecified: N17.9

## 2016-10-04 LAB — COMPREHENSIVE METABOLIC PANEL
ALBUMIN: 1.7 g/dL — AB (ref 3.5–5.0)
ALT: 10 U/L — ABNORMAL LOW (ref 14–54)
ANION GAP: 7 (ref 5–15)
AST: 20 U/L (ref 15–41)
Alkaline Phosphatase: 69 U/L (ref 38–126)
BUN: 31 mg/dL — ABNORMAL HIGH (ref 6–20)
CHLORIDE: 112 mmol/L — AB (ref 101–111)
CO2: 22 mmol/L (ref 22–32)
Calcium: 8.3 mg/dL — ABNORMAL LOW (ref 8.9–10.3)
Creatinine, Ser: 4.07 mg/dL — ABNORMAL HIGH (ref 0.44–1.00)
GFR calc non Af Amer: 13 mL/min — ABNORMAL LOW (ref >60)
GFR, EST AFRICAN AMERICAN: 16 mL/min — AB (ref >60)
GLUCOSE: 97 mg/dL (ref 65–99)
POTASSIUM: 5 mmol/L (ref 3.5–5.1)
SODIUM: 141 mmol/L (ref 135–145)
Total Bilirubin: 0.4 mg/dL (ref 0.3–1.2)
Total Protein: 5.7 g/dL — ABNORMAL LOW (ref 6.5–8.1)

## 2016-10-04 LAB — CBC
HCT: 30.3 % — ABNORMAL LOW (ref 35.0–47.0)
Hemoglobin: 10.2 g/dL — ABNORMAL LOW (ref 12.0–16.0)
MCH: 30.1 pg (ref 26.0–34.0)
MCHC: 33.5 g/dL (ref 32.0–36.0)
MCV: 89.7 fL (ref 80.0–100.0)
PLATELETS: 174 10*3/uL (ref 150–440)
RBC: 3.38 MIL/uL — AB (ref 3.80–5.20)
RDW: 13.9 % (ref 11.5–14.5)
WBC: 6.5 10*3/uL (ref 3.6–11.0)

## 2016-10-04 LAB — LIPASE, BLOOD: LIPASE: 17 U/L (ref 11–51)

## 2016-10-04 LAB — POCT PREGNANCY, URINE: PREG TEST UR: NEGATIVE

## 2016-10-04 MED ORDER — CEFUROXIME AXETIL 500 MG PO TABS
500.0000 mg | ORAL_TABLET | Freq: Two times a day (BID) | ORAL | Status: DC
  Administered 2016-10-05 (×2): 500 mg via ORAL
  Filled 2016-10-04 (×2): qty 1

## 2016-10-04 MED ORDER — HEPARIN SODIUM (PORCINE) 5000 UNIT/ML IJ SOLN
5000.0000 [IU] | Freq: Three times a day (TID) | INTRAMUSCULAR | Status: DC
  Administered 2016-10-05 – 2016-10-06 (×3): 5000 [IU] via SUBCUTANEOUS
  Filled 2016-10-04 (×4): qty 1

## 2016-10-04 MED ORDER — ONDANSETRON HCL 4 MG/2ML IJ SOLN
4.0000 mg | Freq: Once | INTRAMUSCULAR | Status: AC
  Administered 2016-10-04: 4 mg via INTRAVENOUS
  Filled 2016-10-04: qty 2

## 2016-10-04 MED ORDER — SODIUM CHLORIDE 0.9 % IV SOLN
INTRAVENOUS | Status: DC
Start: 2016-10-04 — End: 2016-10-05
  Administered 2016-10-05: via INTRAVENOUS

## 2016-10-04 MED ORDER — ACETAMINOPHEN 325 MG PO TABS
650.0000 mg | ORAL_TABLET | Freq: Four times a day (QID) | ORAL | Status: DC | PRN
  Administered 2016-10-05: 650 mg via ORAL
  Filled 2016-10-04: qty 2

## 2016-10-04 MED ORDER — ONDANSETRON HCL 4 MG PO TABS
4.0000 mg | ORAL_TABLET | Freq: Four times a day (QID) | ORAL | Status: DC | PRN
  Administered 2016-10-05: 4 mg via ORAL
  Filled 2016-10-04: qty 1

## 2016-10-04 MED ORDER — MORPHINE SULFATE (PF) 4 MG/ML IV SOLN
4.0000 mg | Freq: Once | INTRAVENOUS | Status: AC
  Administered 2016-10-04: 4 mg via INTRAVENOUS
  Filled 2016-10-04: qty 1

## 2016-10-04 MED ORDER — MORPHINE SULFATE (PF) 4 MG/ML IV SOLN
2.0000 mg | INTRAVENOUS | Status: DC | PRN
  Administered 2016-10-05: 2 mg via INTRAVENOUS
  Filled 2016-10-04: qty 1

## 2016-10-04 MED ORDER — SODIUM CHLORIDE 0.9 % IV SOLN
Freq: Once | INTRAVENOUS | Status: AC
  Administered 2016-10-04: 18:00:00 via INTRAVENOUS

## 2016-10-04 MED ORDER — ONDANSETRON HCL 4 MG/2ML IJ SOLN
4.0000 mg | Freq: Four times a day (QID) | INTRAMUSCULAR | Status: DC | PRN
  Administered 2016-10-05 – 2016-10-12 (×4): 4 mg via INTRAVENOUS
  Filled 2016-10-04 (×4): qty 2

## 2016-10-04 MED ORDER — ACETAMINOPHEN 650 MG RE SUPP: 650.0000 mg | Freq: Four times a day (QID) | RECTAL | Status: DC | PRN

## 2016-10-04 NOTE — ED Triage Notes (Signed)
Pt states she was recently seen in er and dx with kidney stones.   Pt continues to have emesis and nausea.  Pt denies abd /flank pain.  Pt alert

## 2016-10-04 NOTE — ED Notes (Signed)
Admitting MD at bedside.

## 2016-10-04 NOTE — ED Provider Notes (Addendum)
[facility] Emergency Department Provider Note   ____________________________________________   First MD Initiated Contact with Patient 10/04/16 1800     (approximate)  I have reviewed the triage vital signs and the nursing notes.   HISTORY  Chief Complaint Emesis   HPI Patricia Maldonado is a 36 y.o. female who complains of nausea and vomiting for the last 3 days. She's not been out of keep down anything except for maybe some ice. She denies any diarrhea or fever. Says her abdomen hurts. Pain is made worse by palpation. Pain is moderately severe. Achy. Nothing seems to make the nausea and vomiting any better or any worse.  No past medical history on file.  Patient Active Problem List   Diagnosis Date Noted  . Nephrolithiasis   . Staghorn calculus   . Acute renal failure with tubular necrosis ([place]) 08/30/2016    Past Surgical History:  Procedure Laterality Date  . TUBAL LIGATION      Prior to Admission medications   Medication Sig Start Date End Date Taking? Authorizing Provider  metroNIDAZOLE (METROCREAM) 0.75 % cream Apply topically 2 (two) times daily. 09/04/16   Dustin Flock, MD  triamcinolone cream (KENALOG) 0.1 % Apply topically 2 (two) times daily. 09/04/16   Dustin Flock, MD    Allergies Penicillins  Family History  Problem Relation Age of Onset  . Thyroid disease Mother   . Thyroid disease Father     Social History Social History  Substance Use Topics  . Smoking status: Current Every Day Smoker    Packs/day: 0.50    Types: Cigarettes  . Smokeless tobacco: Never Used  . Alcohol use No    Review of Systems Constitutional: No fever/chills Eyes: No visual changes. ENT: No sore throat. Cardiovascular: Denies chest pain. Respiratory: Denies shortness of breath. Gastrointestinal: See history of present illness Genitourinary: Negative for dysuria. Musculoskeletal: Negative for back pain. Skin: Negative for  rash. Neurological: Negative for headaches, focal weakness or numbness.  10-point ROS otherwise negative.  ____________________________________________   PHYSICAL EXAM:  VITAL SIGNS: ED Triage Vitals  Enc Vitals Group     BP 10/04/16 1726 (!) 177/101     Pulse Rate 10/04/16 1726 (!) 122     Resp 10/04/16 1726 (!) 1     Temp 10/04/16 1726 98.8 F (37.1 C)     Temp Source 10/04/16 1726 Oral     SpO2 10/04/16 1726 99 %     Weight 10/04/16 1727 100 lb (45.4 kg)     Height 10/04/16 1727 5\' 2"  (1.575 m)     Head Circumference --      Peak Flow --      Pain Score --      Pain Loc --      Pain Edu? --      Excl. in [place]? --     Constitutional: Alert and oriented. Well appearing and in no acute distress. Eyes: Conjunctivae are normal. PERRL. EOMI. Head: Atraumatic. Nose: No congestion/rhinnorhea. Mouth/Throat: Mucous membranes are moist.  Oropharynx non-erythematous. Neck: No stridor.   Cardiovascular: Normal rate, regular rhythm. Grossly normal heart sounds.  Good peripheral circulation. Respiratory: Normal respiratory effort.  No retractions. Lungs CTAB. Gastrointestinal: Soft Diffusely tender decreased bowel sounds No distention. No abdominal bruits. No CVA tenderness. Musculoskeletal: No lower extremity tenderness nor edema.  No joint effusions.  ____________________________________________   LABS (all labs ordered are listed, but only abnormal results are displayed)  Labs Reviewed  COMPREHENSIVE METABOLIC PANEL -  Abnormal; Notable for the following:       Result Value   Chloride 112 (*)    BUN 31 (*)    Creatinine, Ser 4.07 (*)    Calcium 8.3 (*)    Total Protein 5.7 (*)    Albumin 1.7 (*)    ALT 10 (*)    GFR calc non Af Amer 13 (*)    GFR calc Af Amer 16 (*)    All other components within normal limits  CBC - Abnormal; Notable for the following:    RBC 3.38 (*)    Hemoglobin 10.2 (*)    HCT 30.3 (*)    All other components within normal limits  LIPASE,  BLOOD  URINALYSIS, COMPLETE (UACMP) WITH MICROSCOPIC  POC URINE PREG, ED  POCT PREGNANCY, URINE   ____________________________________________  EKG  EKG read and interpreted by me shows normal sinus rhythm rate of 84 normal axis no acute ST-T wave changes ____________________________________________  RADIOLOGY  Study Result   CLINICAL DATA:  Intractable vomiting, recently seen in the ER and diagnosed with renal stones. Continues to have emesis and nausea.  EXAM: CT ABDOMEN AND PELVIS WITHOUT CONTRAST  TECHNIQUE: Multidetector CT imaging of the abdomen and pelvis was performed following the standard protocol without IV contrast.  COMPARISON:  08/31/2016  FINDINGS: Lower chest: Nonacute  Hepatobiliary: No focal liver abnormality is seen. No gallstones, gallbladder wall thickening, or biliary dilatation.  Pancreas: Unremarkable  Spleen: Normal  Adrenals/Urinary Tract: Unremarkable adrenal glands. Punctate nonobstructing calculus in the interpolar left kidney again noted measuring approximately 1 mm. No hydronephrosis on the left. Large staghorn calculus of the right kidney with smaller separate peripheral calcifications in the lower pole are again noted with mild lower pole caliectasis and mild dilatation of the right renal pelvis. Findings are relatively similar to previous study. No ureteral dilatation. 2.1 cm cyst in the upper pole right kidney.  Stomach/Bowel: The stomach is mildly distended with fluid. No small bowel dilatation is noted. Lack of oral contrast limits assessment. No bowel obstruction is seen. Moderate fecal residue within large bowel without obstruction. What is believed to be the appendix is normal.  Vascular/Lymphatic: Normal caliber of the abdominal aorta. Nonpathologic sized retroperitoneal lymph nodes as well small bilateral inguinal nodes.  Reproductive: Uterus and bilateral adnexa are unremarkable.  Other: No abdominal  wall hernia or abnormality. No abdominopelvic ascites.  Musculoskeletal: No acute or significant osseous findings.  IMPRESSION: Bilateral renal calculi without obstructive uropathy, staghorn on the right and punctate interpolar on the left. Lack of IV contrast limits assessment for pyelonephritis should be correlated clinically. Stable 2.1 cm upper pole right renal cyst.  Lack of oral contrast and intra-abdominal fat limits assessment of small and large bowel. No bowel obstruction is identified.   Electronically Signed   By: Ashley Royalty M.D.   On: 10/04/2016 19:17    ____________________________________________   PROCEDURES  Procedure(s) performed:  Procedures  Critical Care performed:   ____________________________________________   INITIAL IMPRESSION / ASSESSMENT AND PLAN / ED COURSE  Pertinent labs & imaging results that were available during my care of the patient were reviewed by me and considered in my medical decision making (see chart for details).    Clinical Course      ____________________________________________   FINAL CLINICAL IMPRESSION(S) / ED DIAGNOSES  Final diagnoses:  Dehydration  Intractable vomiting without nausea, unspecified vomiting type  Acute renal failure, unspecified acute renal failure type ([place])      NEW MEDICATIONS  STARTED DURING THIS VISIT:  New Prescriptions   No medications on file     Note:  This document was prepared using Dragon voice recognition software and may include unintentional dictation errors.    Nena Polio, MD 10/04/16 [place], MD 10/04/16 [phone]

## 2016-10-04 NOTE — ED Notes (Signed)
Pt transported to room 228 

## 2016-10-04 NOTE — ED Notes (Signed)
Patient denies pain and is resting comfortably.  

## 2016-10-04 NOTE — H&P (Signed)
[facility] [place] NAME: Patricia Maldonado    MR#:  [id number]  DATE OF BIRTH:  06-11-1981  DATE OF ADMISSION:  10/04/2016  PRIMARY CARE PHYSICIAN: No PCP Per Patient   REQUESTING/REFERRING PHYSICIAN: Dr. Conni Slipper  CHIEF COMPLAINT:   Chief Complaint  Patient presents with  . Emesis    HISTORY OF PRESENT ILLNESS:  Patricia Maldonado  is a 36 y.o. female with a known history of Acute kidney injury, staghorn calculus, recent history of urinary tract infection who presents to the hospital due to intractable nausea and vomiting for the past 2-3 days. Patient was just discharged from the hospital about a month ago with a diagnosis of acute kidney injury secondary dehydration and a UTI and also noted to have a Staghorn calculus. The calculus was nonobstructing and she was discharged on oral antibiotics but she never filled those prescriptions. She says the past 2-3 days she cannot keep anything down with intractable nausea vomiting and abdominal pain located on the left side which is only associated when she eats. She presents to the emergency room as her symptoms are not improving and was noted to be in acute on chronic renal failure with a creatinine at 4.0. Her CT abdomen pelvis that showed a nonobstructing staghorn calculus. Hospitalist services were contacted further treatment and evaluation.  PAST MEDICAL HISTORY:   Past Medical History:  Diagnosis Date  . Acute renal failure (ARF) ([place])   . Rash   . Staghorn calculus     PAST SURGICAL HISTORY:   Past Surgical History:  Procedure Laterality Date  . TUBAL LIGATION      SOCIAL HISTORY:   Social History  Substance Use Topics  . Smoking status: Current Every Day Smoker    Packs/day: 0.50    Years: 15.00    Types: Cigarettes  . Smokeless tobacco: Never Used  . Alcohol use No    FAMILY HISTORY:   Family History  Problem Relation Age of Onset  . Thyroid disease Mother   . Thyroid  disease Father     DRUG ALLERGIES:   Allergies  Allergen Reactions  . Penicillins     Yeast infection    REVIEW OF SYSTEMS:   Review of Systems  Constitutional: Negative for chills, fever and weight loss.  HENT: Negative for congestion, nosebleeds and tinnitus.   Eyes: Negative for blurred vision, double vision and redness.  Respiratory: Negative for cough, hemoptysis, shortness of breath and wheezing.   Cardiovascular: Negative for chest pain, orthopnea, leg swelling and PND.  Gastrointestinal: Positive for abdominal pain (Left Lower Quadrant), nausea and vomiting. Negative for diarrhea and melena.  Genitourinary: Negative for dysuria, hematuria and urgency.  Musculoskeletal: Negative for falls and joint pain.  Neurological: Negative for dizziness, tingling, sensory change, focal weakness, seizures, weakness and headaches.  Endo/Heme/Allergies: Negative for polydipsia. Does not bruise/bleed easily.  Psychiatric/Behavioral: Negative for depression and memory loss. The patient is not nervous/anxious.   All other systems reviewed and are negative.   MEDICATIONS AT HOME:   Prior to Admission medications   Medication Sig Start Date End Date Taking? Authorizing Provider  metroNIDAZOLE (METROCREAM) 0.75 % cream Apply topically 2 (two) times daily. Patient not taking: Reported on 10/04/2016 09/04/16   Dustin Flock, MD  triamcinolone cream (KENALOG) 0.1 % Apply topically 2 (two) times daily. Patient not taking: Reported on 10/04/2016 09/04/16   Dustin Flock, MD      VITAL SIGNS:  Blood pressure (!) 177/101,  pulse (!) 122, temperature 98.8 F (37.1 C), temperature source Oral, resp. rate (!) 1, height 5\' 2"  (1.575 m), weight 45.4 kg (100 lb), last menstrual period 10/02/2016, SpO2 99 %.  PHYSICAL EXAMINATION:  Physical Exam  GENERAL:  36 y.o.-year-old patient lying in the bed in no acute distress.  EYES: Pupils equal, round, reactive to light and accommodation. No scleral icterus.  Extraocular muscles intact.  HEENT: Head atraumatic, normocephalic. Oropharynx and nasopharynx clear. No oropharyngeal erythema, moist oral mucosa  NECK:  Supple, no jugular venous distention. No thyroid enlargement, no tenderness.  LUNGS: Normal breath sounds bilaterally, no wheezing, rales, rhonchi. No use of accessory muscles of respiration.  CARDIOVASCULAR: S1, S2 RRR. No murmurs, rubs, gallops, clicks.  ABDOMEN: Soft, LLQ abdominal pain, No rebound, rigidity, nondistended. Bowel sounds present. No organomegaly or mass.  EXTREMITIES: No pedal edema, cyanosis, or clubbing. + 2 pedal & radial pulses b/l.   NEUROLOGIC: Cranial nerves II through XII are intact. No focal Motor or sensory deficits appreciated b/l PSYCHIATRIC: The patient is alert and oriented x 3. Good affect.  SKIN: No obvious rash, lesion, or ulcer.   LABORATORY PANEL:   CBC  Recent Labs Lab 10/04/16 1725  WBC 6.5  HGB 10.2*  HCT 30.3*  PLT 174   ------------------------------------------------------------------------------------------------------------------  Chemistries   Recent Labs Lab 10/04/16 1725  NA 141  K 5.0  CL 112*  CO2 22  GLUCOSE 97  BUN 31*  CREATININE 4.07*  CALCIUM 8.3*  AST 20  ALT 10*  ALKPHOS 69  BILITOT 0.4   ------------------------------------------------------------------------------------------------------------------  Cardiac Enzymes No results for input(s): TROPONINI in the last 168 hours. ------------------------------------------------------------------------------------------------------------------  RADIOLOGY:  Ct Abdomen Pelvis Wo Contrast  Result Date: 10/04/2016 CLINICAL DATA:  Intractable vomiting, recently seen in the ER and diagnosed with renal stones. Continues to have emesis and nausea. EXAM: CT ABDOMEN AND PELVIS WITHOUT CONTRAST TECHNIQUE: Multidetector CT imaging of the abdomen and pelvis was performed following the standard protocol without IV contrast.  COMPARISON:  08/31/2016 FINDINGS: Lower chest: Nonacute Hepatobiliary: No focal liver abnormality is seen. No gallstones, gallbladder wall thickening, or biliary dilatation. Pancreas: Unremarkable Spleen: Normal Adrenals/Urinary Tract: Unremarkable adrenal glands. Punctate nonobstructing calculus in the interpolar left kidney again noted measuring approximately 1 mm. No hydronephrosis on the left. Large staghorn calculus of the right kidney with smaller separate peripheral calcifications in the lower pole are again noted with mild lower pole caliectasis and mild dilatation of the right renal pelvis. Findings are relatively similar to previous study. No ureteral dilatation. 2.1 cm cyst in the upper pole right kidney. Stomach/Bowel: The stomach is mildly distended with fluid. No small bowel dilatation is noted. Lack of oral contrast limits assessment. No bowel obstruction is seen. Moderate fecal residue within large bowel without obstruction. What is believed to be the appendix is normal. Vascular/Lymphatic: Normal caliber of the abdominal aorta. Nonpathologic sized retroperitoneal lymph nodes as well small bilateral inguinal nodes. Reproductive: Uterus and bilateral adnexa are unremarkable. Other: No abdominal wall hernia or abnormality. No abdominopelvic ascites. Musculoskeletal: No acute or significant osseous findings. IMPRESSION: Bilateral renal calculi without obstructive uropathy, staghorn on the right and punctate interpolar on the left. Lack of IV contrast limits assessment for pyelonephritis should be correlated clinically. Stable 2.1 cm upper pole right renal cyst. Lack of oral contrast and intra-abdominal fat limits assessment of small and large bowel. No bowel obstruction is identified. Electronically Signed   By: Ashley Royalty M.D.   On: 10/04/2016 19:17  IMPRESSION AND PLAN:   36 year old female with past medical history of acute kidney injury, nephrolithiasis, urinary tract infection who  presents to the hospital due to intractable nausea vomiting and left lower quadrant abdominal pain.   1. Acute on chronic kidney injury-secondary to intractable nausea vomiting. -I will hydrate her with IV fluids, follow BUN and creatinine. Patient's creatinine upon discharge about a month ago was 1.6 currently elevated at 4.0.  2. Bilateral nephrolithiasis-patient on CT scan is noted to have a staghorn calculus which is nonobstructive. -She was seen by urology and a previous hospitalization and told to be discharged on oral antibiotics which she never filled. I will place her on oral cefuroxime for now. -Given her abdominal pain and now acute kidney injury I will consult urology to see if she needs further intervention. -Continue supportive care with IV fluids, antibiotics, pain control for now.    All the records are reviewed and case discussed with ED provider. Management plans discussed with the patient, family and they are in agreement.  CODE STATUS: Full Code  TOTAL TIME TAKING CARE OF THIS PATIENT: 45 minutes.    Henreitta Leber M.D on 10/04/2016 at 8:03 PM  Between 7am to 6pm - Pager - [phone]  After 6pm go to [email or web address] - password [email or web address] [facility]  [facility]  Office  [phone]  CC: Primary care physician; No PCP Per Patient

## 2016-10-05 DIAGNOSIS — D649 Anemia, unspecified: Secondary | ICD-10-CM

## 2016-10-05 DIAGNOSIS — R1111 Vomiting without nausea: Secondary | ICD-10-CM

## 2016-10-05 DIAGNOSIS — N200 Calculus of kidney: Secondary | ICD-10-CM

## 2016-10-05 DIAGNOSIS — N179 Acute kidney failure, unspecified: Secondary | ICD-10-CM

## 2016-10-05 LAB — URINALYSIS, COMPLETE (UACMP) WITH MICROSCOPIC
BILIRUBIN URINE: NEGATIVE
Glucose, UA: NEGATIVE mg/dL
KETONES UR: NEGATIVE mg/dL
Nitrite: NEGATIVE
PH: 7 (ref 5.0–8.0)
Specific Gravity, Urine: 1.013 (ref 1.005–1.030)

## 2016-10-05 LAB — BASIC METABOLIC PANEL
ANION GAP: 3 — AB (ref 5–15)
BUN: 26 mg/dL — ABNORMAL HIGH (ref 6–20)
CALCIUM: 7.4 mg/dL — AB (ref 8.9–10.3)
CO2: 20 mmol/L — ABNORMAL LOW (ref 22–32)
CREATININE: 3.6 mg/dL — AB (ref 0.44–1.00)
Chloride: 116 mmol/L — ABNORMAL HIGH (ref 101–111)
GFR, EST AFRICAN AMERICAN: 18 mL/min — AB (ref >60)
GFR, EST NON AFRICAN AMERICAN: 16 mL/min — AB (ref >60)
Glucose, Bld: 89 mg/dL (ref 65–99)
Potassium: 4.8 mmol/L (ref 3.5–5.1)
SODIUM: 139 mmol/L (ref 135–145)

## 2016-10-05 LAB — PROTEIN / CREATININE RATIO, URINE
CREATININE, URINE: 96 mg/dL
Protein Creatinine Ratio: 10.99 mg/mg{Cre} — ABNORMAL HIGH (ref 0.00–0.15)
TOTAL PROTEIN, URINE: 1055 mg/dL

## 2016-10-05 LAB — CBC
HCT: 25.4 % — ABNORMAL LOW (ref 35.0–47.0)
HEMOGLOBIN: 8.4 g/dL — AB (ref 12.0–16.0)
MCH: 30 pg (ref 26.0–34.0)
MCHC: 33.2 g/dL (ref 32.0–36.0)
MCV: 90.3 fL (ref 80.0–100.0)
Platelets: 130 10*3/uL — ABNORMAL LOW (ref 150–440)
RBC: 2.81 MIL/uL — AB (ref 3.80–5.20)
RDW: 13.9 % (ref 11.5–14.5)
WBC: 4.7 10*3/uL (ref 3.6–11.0)

## 2016-10-05 LAB — IRON AND TIBC
IRON: 38 ug/dL (ref 28–170)
SATURATION RATIOS: 30 % (ref 10.4–31.8)
TIBC: 126 ug/dL — AB (ref 250–450)
UIBC: 88 ug/dL

## 2016-10-05 LAB — CK: CK TOTAL: 74 U/L (ref 38–234)

## 2016-10-05 MED ORDER — PANTOPRAZOLE SODIUM 40 MG IV SOLR
40.0000 mg | Freq: Two times a day (BID) | INTRAVENOUS | Status: DC
  Administered 2016-10-05 – 2016-10-12 (×15): 40 mg via INTRAVENOUS
  Filled 2016-10-05 (×15): qty 40

## 2016-10-05 MED ORDER — SUCRALFATE 1 GM/10ML PO SUSP
1.0000 g | Freq: Three times a day (TID) | ORAL | Status: DC
  Administered 2016-10-05 – 2016-10-12 (×25): 1 g via ORAL
  Filled 2016-10-05 (×48): qty 10

## 2016-10-05 MED ORDER — DEXTROSE-NACL 5-0.9 % IV SOLN
INTRAVENOUS | Status: DC
  Administered 2016-10-05 – 2016-10-06 (×3): via INTRAVENOUS

## 2016-10-05 MED ORDER — CEFUROXIME AXETIL 500 MG PO TABS
500.0000 mg | ORAL_TABLET | Freq: Every day | ORAL | Status: DC
  Administered 2016-10-06 – 2016-10-12 (×7): 500 mg via ORAL
  Filled 2016-10-05 (×7): qty 1

## 2016-10-05 NOTE — Consult Note (Signed)
[name] MD  [address] [place], [place] [address] Phone: [phone] Fax : [phone]  Consultation  Referring Provider:     No ref. provider found Primary Care Physician:  No PCP Per Patient Primary Gastroenterologist:  Dr. Vicente Males         Reason for Consultation:     Abdominal pain   Date of Admission:  10/04/2016 Date of Consultation:  10/05/2016         HPI:   Patricia Maldonado is a 36 y.o. female admitted yesterday with nausea/vomiting/abdominal pain for 2-3 days. H/o staghorn calculus ,UTI , discharged 1 month back with AKI, UTI.  She has been admitted with an [place]. CT abdomen shows b/l renal calculi , staghorn rt and interporlar calculi on the left .   On admission Cr 3.6(creatinine was normal in 03/2016  , Hb 8.4 . Hb 12.2.2017 was 9.2 grams  She says after her hospital discharge did not fill her antibiotics up and last few days had nausea,vomiting , came into the hospital and on repeated abdominal exams by the physicians she states that she developed LLQ pain which is the site she has had pain in the past when having an UTI.   Denies any NSAID's denies any hematemesis/change in bowel habits or black colored stool.Presently no nausea or vomiting.       Past Medical History:  Diagnosis Date  . Acute renal failure (ARF) ([place])   . Rash   . Staghorn calculus     Past Surgical History:  Procedure Laterality Date  . TUBAL LIGATION      Prior to Admission medications   Medication Sig Start Date End Date Taking? Authorizing Provider  metroNIDAZOLE (METROCREAM) 0.75 % cream Apply topically 2 (two) times daily. Patient not taking: Reported on 10/04/2016 09/04/16   Dustin Flock, MD  triamcinolone cream (KENALOG) 0.1 % Apply topically 2 (two) times daily. Patient not taking: Reported on 10/04/2016 09/04/16   Dustin Flock, MD    Family History  Problem Relation Age of Onset  . Thyroid disease Mother   . Thyroid disease Father      Social History  Substance Use Topics   . Smoking status: Current Every Day Smoker    Packs/day: 0.50    Years: 15.00    Types: Cigarettes  . Smokeless tobacco: Never Used  . Alcohol use No    Allergies as of 10/04/2016 - Review Complete 10/04/2016  Allergen Reaction Noted  . Penicillins  03/20/2016    Review of Systems:    All systems reviewed and negative except where noted in HPI.   Physical Exam:  Vital signs in last 24 hours: Temp:  [98 F (36.7 C)-98.8 F (37.1 C)] 98.2 F (36.8 C) (01/03 0834) Pulse Rate:  [77-122] 87 (01/03 0834) Resp:  [1-20] 16 (01/03 0834) BP: (103-177)/(71-101) 111/80 (01/03 0834) SpO2:  [99 %-100 %] 100 % (01/03 0834) Weight:  [100 lb (45.4 kg)] 100 lb (45.4 kg) (01/02 1727) Last BM Date: 10/03/16 General:   Pleasant, cooperative in NAD Head:  Normocephalic and atraumatic. Eyes:   No icterus.   Conjunctiva pink. PERRLA. Ears:  Normal auditory acuity. Neck:  Supple; no masses or thyroidomegaly Lungs: Respirations even and unlabored. Lungs clear to auscultation bilaterally.   No wheezes, crackles, or rhonchi.  Heart:  Regular rate and rhythm;  Without murmur, clicks, rubs or gallops Abdomen:  Soft, nondistended, nontender. Normal bowel sounds. No appreciable masses or hepatomegaly.  No rebound or guarding.  Neurologic:  Alert  and oriented x3;  grossly normal neurologically. Psych:  Alert and cooperative. Normal affect.  LAB RESULTS:  Recent Labs  10/04/16 1725 10/05/16 0547  WBC 6.5 4.7  HGB 10.2* 8.4*  HCT 30.3* 25.4*  PLT 174 130*   BMET  Recent Labs  10/04/16 1725 10/05/16 0547  NA 141 139  K 5.0 4.8  CL 112* 116*  CO2 22 20*  GLUCOSE 97 89  BUN 31* 26*  CREATININE 4.07* 3.60*  CALCIUM 8.3* 7.4*   LFT  Recent Labs  10/04/16 1725  PROT 5.7*  ALBUMIN 1.7*  AST 20  ALT 10*  ALKPHOS 69  BILITOT 0.4   PT/INR No results for input(s): LABPROT, INR in the last 72 hours.  STUDIES: Ct Abdomen Pelvis Wo Contrast  Result Date: 10/04/2016 CLINICAL DATA:   Intractable vomiting, recently seen in the ER and diagnosed with renal stones. Continues to have emesis and nausea. EXAM: CT ABDOMEN AND PELVIS WITHOUT CONTRAST TECHNIQUE: Multidetector CT imaging of the abdomen and pelvis was performed following the standard protocol without IV contrast. COMPARISON:  08/31/2016 FINDINGS: Lower chest: Nonacute Hepatobiliary: No focal liver abnormality is seen. No gallstones, gallbladder wall thickening, or biliary dilatation. Pancreas: Unremarkable Spleen: Normal Adrenals/Urinary Tract: Unremarkable adrenal glands. Punctate nonobstructing calculus in the interpolar left kidney again noted measuring approximately 1 mm. No hydronephrosis on the left. Large staghorn calculus of the right kidney with smaller separate peripheral calcifications in the lower pole are again noted with mild lower pole caliectasis and mild dilatation of the right renal pelvis. Findings are relatively similar to previous study. No ureteral dilatation. 2.1 cm cyst in the upper pole right kidney. Stomach/Bowel: The stomach is mildly distended with fluid. No small bowel dilatation is noted. Lack of oral contrast limits assessment. No bowel obstruction is seen. Moderate fecal residue within large bowel without obstruction. What is believed to be the appendix is normal. Vascular/Lymphatic: Normal caliber of the abdominal aorta. Nonpathologic sized retroperitoneal lymph nodes as well small bilateral inguinal nodes. Reproductive: Uterus and bilateral adnexa are unremarkable. Other: No abdominal wall hernia or abnormality. No abdominopelvic ascites. Musculoskeletal: No acute or significant osseous findings. IMPRESSION: Bilateral renal calculi without obstructive uropathy, staghorn on the right and punctate interpolar on the left. Lack of IV contrast limits assessment for pyelonephritis should be correlated clinically. Stable 2.1 cm upper pole right renal cyst. Lack of oral contrast and intra-abdominal fat limits  assessment of small and large bowel. No bowel obstruction is identified. Electronically Signed   By: Ashley Royalty M.D.   On: 10/04/2016 19:17      Impression / Plan:   Patricia Maldonado is a 36 y.o. y/o female with recurrent UTI , staghorn calculus , come into the hospital with nausea/vomiting , found to be in AKI. We have been consulted for abdominal pain , nausea,vomiting.    1. Nausea and vomiting has resolved 2. LLQ abdominal pain appears to be related to her renal calculi/uti -no tenderness presently 3. Normocytic anemia- no overt blood loss per history . Suggest to check iron studies, may have anemic of chronic disease. Drop since admission likely from rehydration , her hb is close to her Hb on 09/03/16 .   Thank you for involving me in the care of this patient.      LOS: 1 day   [name], MD  10/05/2016, 1:55 PM

## 2016-10-05 NOTE — Progress Notes (Signed)
Pharmacy Antibiotic Note  Patricia Maldonado is a 36 y.o. female admitted on 10/04/2016 with acute renal failure.  Pharmacy has been consulted for renal adjustment of cefuroxime.  Plan: Patient started on cefuroxime 500mg  PO Q12hr. Per protocol, will transition patient to cefuroxime 500mg  PO Q24hr. Will follow renal function closely in setting of acute renal failure.   Height: 5\' 2"  (157.5 cm) Weight: 100 lb (45.4 kg) IBW/kg (Calculated) : 50.1  Temp (24hrs), Avg:98.3 F (36.8 C), Min:98 F (36.7 C), Max:98.8 F (37.1 C)   Recent Labs Lab 10/04/16 1725 10/05/16 0547  WBC 6.5 4.7  CREATININE 4.07* 3.60*    Estimated Creatinine Clearance: 15.9 mL/min (by C-G formula based on SCr of 3.6 mg/dL (H)).    Allergies  Allergen Reactions  . Penicillins     Yeast infection    Antimicrobials this admission: Cefuroxime 1/2 >>   Dose adjustments this admission: 1/3 Cefuroxime transitioned from Q12h to Q24hr dosing.   Microbiology results: 1/2 UCx: sent  Pharmacy will continue to monitor and adjust per consult.   Tiquan Bouch L 10/05/2016 9:53 AM

## 2016-10-05 NOTE — Progress Notes (Signed)
Subjective:   Patient presents for nausea and vomiting 3 days prior to admission. Had not been able to keep anything down except eyes. Reports abdominal pain. Was recently admitted for urinary tract infection. Also found to have acute renal failure and staghorn calculus Patient was supposed to follow-up with urology and nephrology but did not go because of transportation problems. She did not fill her prescriptions for antibiotics. Prior to discharge, creatinine was 1.84 Presenting creatinine was 4.07, which has slightly improved to 3.60 today Patient also reports cramping in her hands and legs. She has a facial rash She had rash over her back and abdomen which has cleared now She denies any acute joint pains She does endorse hair loss  Objective:  Vital signs in last 24 hours:  Temp:  [98 F (36.7 C)-98.6 F (37 C)] 98.6 F (37 C) (01/03 1359) Pulse Rate:  [77-99] 87 (01/03 0834) Resp:  [16-20] 18 (01/03 1359) BP: (101-124)/(66-90) 101/66 (01/03 1359) SpO2:  [99 %-100 %] 100 % (01/03 0834)  Weight change:  Filed Weights   10/04/16 1727  Weight: 45.4 kg (100 lb)    Intake/Output:    Intake/Output Summary (Last 24 hours) at 10/05/16 1820 Last data filed at 10/05/16 1640  Gross per 24 hour  Intake          3024.33 ml  Output              400 ml  Net          2624.33 ml     Physical Exam: General: No acute distress, laying in the bed,   HEENT Hair loss noted of her eyelashes, moist oral mucous membranes   Neck Supple   Pulm/lungs Normal breathing effort, clear to auscultation   CVS/Heart Regular rate and rhythm, no rub or gallop   Abdomen:  Soft, nontender   Extremities: No peripheral edema   Neurologic: Alert, oriented   Skin: Non-confluent rash over face involving perioral area, right cheek, not typical butterfly rash of lupus   Access:        Basic Metabolic Panel:   Recent Labs Lab 10/04/16 1725 10/05/16 0547  NA 141 139  K 5.0 4.8  CL 112* 116*   CO2 22 20*  GLUCOSE 97 89  BUN 31* 26*  CREATININE 4.07* 3.60*  CALCIUM 8.3* 7.4*     CBC:  Recent Labs Lab 10/04/16 1725 10/05/16 0547  WBC 6.5 4.7  HGB 10.2* 8.4*  HCT 30.3* 25.4*  MCV 89.7 90.3  PLT 174 130*      Microbiology:  No results found for this or any previous visit (from the past 720 hour(s)).  Coagulation Studies: No results for input(s): LABPROT, INR in the last 72 hours.  Urinalysis:  Recent Labs  10/05/16 1219  COLORURINE YELLOW*  LABSPEC 1.013  PHURINE 7.0  GLUCOSEU NEGATIVE  HGBUR MODERATE*  BILIRUBINUR NEGATIVE  KETONESUR NEGATIVE  PROTEINUR >=300*  NITRITE NEGATIVE  LEUKOCYTESUR TRACE*      Imaging: Ct Abdomen Pelvis Wo Contrast  Result Date: 10/04/2016 CLINICAL DATA:  Intractable vomiting, recently seen in the ER and diagnosed with renal stones. Continues to have emesis and nausea. EXAM: CT ABDOMEN AND PELVIS WITHOUT CONTRAST TECHNIQUE: Multidetector CT imaging of the abdomen and pelvis was performed following the standard protocol without IV contrast. COMPARISON:  08/31/2016 FINDINGS: Lower chest: Nonacute Hepatobiliary: No focal liver abnormality is seen. No gallstones, gallbladder wall thickening, or biliary dilatation. Pancreas: Unremarkable Spleen: Normal Adrenals/Urinary Tract: Unremarkable adrenal glands. Punctate  nonobstructing calculus in the interpolar left kidney again noted measuring approximately 1 mm. No hydronephrosis on the left. Large staghorn calculus of the right kidney with smaller separate peripheral calcifications in the lower pole are again noted with mild lower pole caliectasis and mild dilatation of the right renal pelvis. Findings are relatively similar to previous study. No ureteral dilatation. 2.1 cm cyst in the upper pole right kidney. Stomach/Bowel: The stomach is mildly distended with fluid. No small bowel dilatation is noted. Lack of oral contrast limits assessment. No bowel obstruction is seen. Moderate fecal  residue within large bowel without obstruction. What is believed to be the appendix is normal. Vascular/Lymphatic: Normal caliber of the abdominal aorta. Nonpathologic sized retroperitoneal lymph nodes as well small bilateral inguinal nodes. Reproductive: Uterus and bilateral adnexa are unremarkable. Other: No abdominal wall hernia or abnormality. No abdominopelvic ascites. Musculoskeletal: No acute or significant osseous findings. IMPRESSION: Bilateral renal calculi without obstructive uropathy, staghorn on the right and punctate interpolar on the left. Lack of IV contrast limits assessment for pyelonephritis should be correlated clinically. Stable 2.1 cm upper pole right renal cyst. Lack of oral contrast and intra-abdominal fat limits assessment of small and large bowel. No bowel obstruction is identified. Electronically Signed   By: Ashley Royalty M.D.   On: 10/04/2016 19:17     Medications:   . dextrose 5 % and 0.9% NaCl 100 mL/hr at 10/05/16 1341   . [START ON 10/06/2016] cefUROXime  500 mg Oral Daily  . heparin  5,000 Units Subcutaneous Q8H  . pantoprazole (PROTONIX) IV  40 mg Intravenous Q12H  . sucralfate  1 g Oral TID WC & HS   acetaminophen **OR** acetaminophen, morphine injection, ondansetron **OR** ondansetron (ZOFRAN) IV  Assessment/ Plan:  36 y.o. female with recent acute renal failure, left staghorn calculus, urinary tract infection, presents for nausea vomiting and decreased oral intake for 3 days  1. Recurrent acute renal failure 2. Nephrotic syndrome  3. 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 Recent UTI 5. Hematuria  Previous workup from November 2017 shows positive double-stranded DNA, ENA, positive double-stranded DNA,positive  ENA and chromatin antibodies, low C3 and C4, positive ribonucleoprotein antibodies Myeloperoxidase , anti-GBM antibodies are negative. 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 She will need high dose iv steroids and kidney biopsy once infections are cleared We will follow closely   LOS: 1 Niranjan Rufener 1/3/20186:20 PM

## 2016-10-05 NOTE — Progress Notes (Signed)
Patricia Maldonado at [place] NAME: Patricia Maldonado    MR#:  [id number]  DATE OF BIRTH:  [birth year]  SUBJECTIVE:  CHIEF COMPLAINT:   Chief Complaint  Patient presents with  . Emesis  Patient is a 36 year old female with past medical history significant for history of admission to [facility] at the end of November due to urinary tract infection, discharged on antibiotic, which she was not able to afford and did not obtain, who presents back to the hospital with complaints of nausea, vomiting, abdominal pain. On arrival to the hospital, she was noted to be in acute renal failure. Patient was admitted to the hospital for further evaluation and treatment. She denied any fevers or chills, left flank pain. She continues to have nausea and vomiting, unable to tolerate food.  Review of Systems  Constitutional: Negative for chills, fever and weight loss.  HENT: Negative for congestion.   Eyes: Negative for blurred vision and double vision.  Respiratory: Negative for cough, sputum production, shortness of breath and wheezing.   Cardiovascular: Negative for chest pain, palpitations, orthopnea, leg swelling and PND.  Gastrointestinal: Positive for abdominal pain, nausea and vomiting. Negative for blood in stool, constipation and diarrhea.  Genitourinary: Negative for dysuria, frequency, hematuria and urgency.  Musculoskeletal: Negative for falls.  Neurological: Negative for dizziness, tremors, focal weakness and headaches.  Endo/Heme/Allergies: Does not bruise/bleed easily.  Psychiatric/Behavioral: Negative for depression. The patient does not have insomnia.     VITAL SIGNS: Blood pressure 101/66, pulse 87, temperature 98.6 F (37 C), temperature source Oral, resp. rate 18, height 5\' 2"  (1.575 m), weight 45.4 kg (100 lb), last menstrual period 10/02/2016, SpO2 100 %.  PHYSICAL EXAMINATION:   GENERAL:  36 y.o.-year-old patient lying in the  bed with no acute distress.  EYES: Pupils equal, round, reactive to light and accommodation. No scleral icterus. Extraocular muscles intact.  HEENT: Head atraumatic, normocephalic. Oropharynx and nasopharynx clear.  NECK:  Supple, no jugular venous distention. No thyroid enlargement, no tenderness.  LUNGS: Normal breath sounds bilaterally, no wheezing, rales,rhonchi or crepitation. No use of accessory muscles of respiration.  CARDIOVASCULAR: S1, S2 normal. No murmurs, rubs, or gallops.  ABDOMEN: Soft, tender, mostly left upper quadrant but no rebound or guarding was noted, nondistended. Bowel sounds present. No organomegaly or mass.  EXTREMITIES: No pedal edema, cyanosis, or clubbing.  NEUROLOGIC: Cranial nerves II through XII are intact. Muscle strength 5/5 in all extremities. Sensation intact. Gait not checked.  PSYCHIATRIC: The patient is alert and oriented x 3.  SKIN: No obvious rash, lesion, or ulcer.   ORDERS/RESULTS REVIEWED:   CBC  Recent Labs Lab 10/04/16 1725 10/05/16 0547  WBC 6.5 4.7  HGB 10.2* 8.4*  HCT 30.3* 25.4*  PLT 174 130*  MCV 89.7 90.3  MCH 30.1 30.0  MCHC 33.5 33.2  RDW 13.9 13.9   ------------------------------------------------------------------------------------------------------------------  Chemistries   Recent Labs Lab 10/04/16 1725 10/05/16 0547  NA 141 139  K 5.0 4.8  CL 112* 116*  CO2 22 20*  GLUCOSE 97 89  BUN 31* 26*  CREATININE 4.07* 3.60*  CALCIUM 8.3* 7.4*  AST 20  --   ALT 10*  --   ALKPHOS 69  --   BILITOT 0.4  --    ------------------------------------------------------------------------------------------------------------------ estimated creatinine clearance is 15.9 mL/min (by C-G formula based on SCr of 3.6 mg/dL (H)). ------------------------------------------------------------------------------------------------------------------ No results for input(s): TSH, T4TOTAL, T3FREE, THYROIDAB in the last 72 hours.  Invalid  input(s): FREET3  Cardiac Enzymes No results for input(s): CKMB, TROPONINI, MYOGLOBIN in the last 168 hours.  Invalid input(s): CK ------------------------------------------------------------------------------------------------------------------ Invalid input(s): POCBNP ---------------------------------------------------------------------------------------------------------------  RADIOLOGY: Ct Abdomen Pelvis Wo Contrast  Result Date: 10/04/2016 CLINICAL DATA:  Intractable vomiting, recently seen in the ER and diagnosed with renal stones. Continues to have emesis and nausea. EXAM: CT ABDOMEN AND PELVIS WITHOUT CONTRAST TECHNIQUE: Multidetector CT imaging of the abdomen and pelvis was performed following the standard protocol without IV contrast. COMPARISON:  08/31/2016 FINDINGS: Lower chest: Nonacute Hepatobiliary: No focal liver abnormality is seen. No gallstones, gallbladder wall thickening, or biliary dilatation. Pancreas: Unremarkable Spleen: Normal Adrenals/Urinary Tract: Unremarkable adrenal glands. Punctate nonobstructing calculus in the interpolar left kidney again noted measuring approximately 1 mm. No hydronephrosis on the left. Large staghorn calculus of the right kidney with smaller separate peripheral calcifications in the lower pole are again noted with mild lower pole caliectasis and mild dilatation of the right renal pelvis. Findings are relatively similar to previous study. No ureteral dilatation. 2.1 cm cyst in the upper pole right kidney. Stomach/Bowel: The stomach is mildly distended with fluid. No small bowel dilatation is noted. Lack of oral contrast limits assessment. No bowel obstruction is seen. Moderate fecal residue within large bowel without obstruction. What is believed to be the appendix is normal. Vascular/Lymphatic: Normal caliber of the abdominal aorta. Nonpathologic sized retroperitoneal lymph nodes as well small bilateral inguinal nodes. Reproductive: Uterus and  bilateral adnexa are unremarkable. Other: No abdominal wall hernia or abnormality. No abdominopelvic ascites. Musculoskeletal: No acute or significant osseous findings. IMPRESSION: Bilateral renal calculi without obstructive uropathy, staghorn on the right and punctate interpolar on the left. Lack of IV contrast limits assessment for pyelonephritis should be correlated clinically. Stable 2.1 cm upper pole right renal cyst. Lack of oral contrast and intra-abdominal fat limits assessment of small and large bowel. No bowel obstruction is identified. Electronically Signed   By: Ashley Royalty M.D.   On: 10/04/2016 19:17    EKG:  Orders placed or performed during the hospital encounter of 10/04/16  . ED EKG  . ED EKG    ASSESSMENT AND PLAN:  Active Problems:   Acute renal failure (ARF) (HCC)  #1. Acute on chronic renal failure improving on IV fluid administration, follow creatinine, as well as urinary output, nephrology consultation is pending, no hydronephrosis or obstruction on CT, appreciate urologist input #2, staghorn calculus, no intervention at this time, follow-up with urologist as outpatient #3. Pyuria, suspected urinary infection, get urine cultures, continue patient on Ceftin, just antibiotics depending on culture results, suspect pyelonephritis   #4 left upper quadrant abdominal pain, nausea, vomiting, concerning for gastritis, initiate patient on Protonix, Carafate, get gastroenterologist involved, change diet to clear liquids, advance diet as tolerated, follow clinically, continue IV fluids at lower rate #5. Severe malnutrition, get dietary involved recommendations  #6. Anemia with rehydration, thrombocytopenia, get Hemoccult, patient may benefit from EGD, gastroenterology consultation is requested, pending    Management plans discussed with the patient, family and they are in agreement.   DRUG ALLERGIES:  Allergies  Allergen Reactions  . Penicillins     Yeast infection    CODE  STATUS:     Code Status Orders        Start     Ordered   10/04/16 2151  Full code  Continuous     10/04/16 2150    Code Status History    Date Active Date Inactive Code Status Order ID Comments User Context  08/30/2016  7:13 PM 09/04/2016  2:43 PM Full Code [id number]  Lytle Butte, MD ED      TOTAL TIME TAKING CARE OF THIS PATIENT: 35 minutes.    Theodoro Grist M.D on 10/05/2016 at 5:15 PM  Between 7am to 6pm - Pager - [phone]  After 6pm go to [email or web address] - password [email or web address] [facility]  [facility]  Office  [phone]  CC: Primary care physician; No PCP Per Patient

## 2016-10-05 NOTE — Consult Note (Signed)
Urology Consult  I have been asked to see the patient by Dr. Tor Netters, for evaluation and management of right renal calculi.  Chief Complaint: n/v  History of Present Illness: Patricia Maldonado is a 36 y.o. year old readmitted to the medical service with nausea and vomiting, fatigue, and poor appetite for the past month, worsening acute renal failure, left lower quadrant abdominal pain.  She has a known partial right staghorn with caliectasis and was seen during her last admission on 08/31/2016 with plans for antibiotics and ultimately nephrolithotomy, has outpatient appointment on 10/12/2016.  Unfortunately, she never filled her antibiotics due to cost issues. 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 She was scheduled to follow up with nephrology but failed to do so.  CT stone protocol shows stable large partial staghorn stone involving the lower and mid poles extending into the renal pelvis and proximal ureter without associated hydronephrosis. There was some mild mild caliectasis and evidence of chronic inflammation.  No significant stone burden on contralateral side.  This is essentially unchanged from her previous scan.  UA last visit was grossly positive urine culture but urine culture negative. Timing of antibiotics was unclear. UA again today appears somewhat suspicious.  She denies a personal history of stones. She denies any flank pain, gross hematuria, or urinary symptoms including urinary frequency, urgency, or dysuria.  She was treated for urinary tract infection in June 2017 but prior to this, no history of urinary tract infections other than during pregnancy.  Her baseline creatinine is normal. Last admission, her creatinine was elevated to 2.23, 1.8 upon discharge despite hydration.    Upon admission yesterday, her creatinine was up to 4.07, improved to 3.6 today with  hydration.  No significant leukocytosis or evidence of hemodynamic instability or fever.      Past Medical History:  Diagnosis Date  . Acute renal failure (ARF) ([place])   . Rash   . Staghorn calculus     Past Surgical History:  Procedure Laterality Date  . TUBAL LIGATION      Home Medications:  No outpatient prescriptions have been marked as taking for the 10/04/16 encounter [facility] Encounter).    Allergies:  Allergies  Allergen Reactions  . Penicillins     Yeast infection    Family History  Problem Relation Age of Onset  . Thyroid disease Mother   . Thyroid disease Father     Social History:  reports that she has been smoking Cigarettes.  She has a 7.50 pack-year smoking history. She has never used smokeless tobacco. She reports that she uses drugs, including Marijuana. She reports that she does not drink alcohol.  ROS: A complete review of systems was performed.  All systems are negative except for pertinent findings as noted.  Physical Exam:  Vital signs in last 24 hours: Temp:  [98 F (36.7 C)-98.8 F (37.1 C)] 98.2 F (36.8 C) (01/03 0834) Pulse Rate:  [77-122] 87 (01/03 0834) Resp:  [1-20] 16 (01/03 0834) BP: (103-177)/(71-101) 111/80 (01/03 0834) SpO2:  [99 %-100 %] 100 % (01/03 0834) Weight:  [100 lb (45.4 kg)] 100 lb (45.4 kg) (01/02 1727) Constitutional:  Alert and oriented, No acute distress.  Patient's family is at bedside. HEENT: [place] AT, moist mucus membranes.  Trachea midline, no masses Cardiovascular: Regular rate and rhythm, no clubbing, cyanosis, or edema. Respiratory: Normal respiratory effort, lungs clear bilaterally GI: Abdomen is  soft, nondistended, no abdominal masses.  Mild tenderness to deep palpation in the left lower quadrant, no rebound or guarding. GU: No CVA tenderness Skin: Hyperpigmented rash on face, perioral extending to cheeks. Lymph: No cervical or inguinal adenopathy Neurologic: Grossly intact, no focal deficits, moving all 4  extremities Psychiatric: Normal mood and affect   Laboratory Data:   Recent Labs  10/04/16 1725 10/05/16 0547  WBC 6.5 4.7  HGB 10.2* 8.4*  HCT 30.3* 25.4*    Recent Labs  10/04/16 1725 10/05/16 0547  NA 141 139  K 5.0 4.8  CL 112* 116*  CO2 22 20*  GLUCOSE 97 89  BUN 31* 26*  CREATININE 4.07* 3.60*  CALCIUM 8.3* 7.4*   No results for input(s): LABPT, INR in the last 72 hours. No results for input(s): LABURIN in the last 72 hours. Results for orders placed or performed during the hospital encounter of 08/30/16  Urine culture     Status: Abnormal   Collection Time: 08/30/16  5:15 PM  Result Value Ref Range Status   Specimen Description URINE, RANDOM  Final   Special Requests NONE  Final   Culture MULTIPLE SPECIES PRESENT, SUGGEST RECOLLECTION (A)  Final   Report Status 09/01/2016 FINAL  Final  CULTURE, BLOOD (ROUTINE X 2) w Reflex to ID Panel     Status: None   Collection Time: 08/30/16  8:59 PM  Result Value Ref Range Status   Specimen Description BLOOD  LEFT AC  Final   Special Requests   Final    BOTTLES DRAWN AEROBIC AND ANAEROBIC  ANA 9ML AER 12ML   Culture NO GROWTH 5 DAYS  Final   Report Status 09/04/2016 FINAL  Final  CULTURE, BLOOD (ROUTINE X 2) w Reflex to ID Panel     Status: None   Collection Time: 08/30/16  8:59 PM  Result Value Ref Range Status   Specimen Description BLOOD  LEFT WRIST  Final   Special Requests   Final    BOTTLES DRAWN AEROBIC AND ANAEROBIC  ANA 11ML AER 9ML   Culture NO GROWTH 5 DAYS  Final   Report Status 09/04/2016 FINAL  Final  Urine culture     Status: None   Collection Time: 08/31/16  7:52 PM  Result Value Ref Range Status   Specimen Description URINE, CLEAN CATCH  Final   Special Requests NONE  Final   Culture NO GROWTH Performed at [facility]   Final   Report Status 09/02/2016 FINAL  Final     Radiologic Imaging: Ct Abdomen Pelvis Wo Contrast  Result Date: 10/04/2016 CLINICAL DATA:  Intractable vomiting,  recently seen in the ER and diagnosed with renal stones. Continues to have emesis and nausea. EXAM: CT ABDOMEN AND PELVIS WITHOUT CONTRAST TECHNIQUE: Multidetector CT imaging of the abdomen and pelvis was performed following the standard protocol without IV contrast. COMPARISON:  08/31/2016 FINDINGS: Lower chest: Nonacute Hepatobiliary: No focal liver abnormality is seen. No gallstones, gallbladder wall thickening, or biliary dilatation. Pancreas: Unremarkable Spleen: Normal Adrenals/Urinary Tract: Unremarkable adrenal glands. Punctate nonobstructing calculus in the interpolar left kidney again noted measuring approximately 1 mm. No hydronephrosis on the left. Large staghorn calculus of the right kidney with smaller separate peripheral calcifications in the lower pole are again noted with mild lower pole caliectasis and mild dilatation of the right renal pelvis. Findings are relatively similar to previous study. No ureteral dilatation. 2.1 cm cyst in the upper pole right kidney. Stomach/Bowel: The stomach is mildly distended with  fluid. No small bowel dilatation is noted. Lack of oral contrast limits assessment. No bowel obstruction is seen. Moderate fecal residue within large bowel without obstruction. What is believed to be the appendix is normal. Vascular/Lymphatic: Normal caliber of the abdominal aorta. Nonpathologic sized retroperitoneal lymph nodes as well small bilateral inguinal nodes. Reproductive: Uterus and bilateral adnexa are unremarkable. Other: No abdominal wall hernia or abnormality. No abdominopelvic ascites. Musculoskeletal: No acute or significant osseous findings. IMPRESSION: Bilateral renal calculi without obstructive uropathy, staghorn on the right and punctate interpolar on the left. Lack of IV contrast limits assessment for pyelonephritis should be correlated clinically. Stable 2.1 cm upper pole right renal cyst. Lack of oral contrast and intra-abdominal fat limits assessment of small and  large bowel. No bowel obstruction is identified. Electronically Signed   By: Dezyre Hoefer Royalty M.D.   On: 10/04/2016 19:17    CT  personally reviewed  Impression/Plan: 36 year old female admitted with right partial staghorn (nonobstructing) with worsening renal function.    1. Right staghorn calculus- patient encouraged to follow-up on 10/12/2016 as previously scheduled to arrange for right percutaneous nephrolithotomy, no plans for intervention during this admission unless becomes febrile/ develops leukocytosis, etc.  In that situation, would opt for percutaneous nephrostomy tube placement in anticipation of need for this for PCNL.  2. Possible UTI- UA appears suspicious for possible infection, although glomerulonephritis could also cause similar changes in the urine. Agree with continuation of antibiotics, would discharge on by mouth antibiotics for at least 2 weeks in anticipation of upcoming stone manipulation surgery.  May need case management to help with financial issues leading to noncompliance with abx following last admission.  No leukocytosis or fevers to suggest severe pyelonephritis.  3. 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 In addition, she does have a suspicious rash (? Underlying lupus).  Nephrology consultation, discussed personally today.  4. Abdominal pain/ nausea/ vomiting- unrelated to stone based on location and nature of pain.  Would continue to assess for other underlying etiologies.  10/05/2016, 1:38 PM  [name],  MD

## 2016-10-06 LAB — CBC
HEMATOCRIT: 25.1 % — AB (ref 35.0–47.0)
HEMOGLOBIN: 8.3 g/dL — AB (ref 12.0–16.0)
MCH: 30.5 pg (ref 26.0–34.0)
MCHC: 33 g/dL (ref 32.0–36.0)
MCV: 92.3 fL (ref 80.0–100.0)
Platelets: 130 10*3/uL — ABNORMAL LOW (ref 150–440)
RBC: 2.71 MIL/uL — AB (ref 3.80–5.20)
RDW: 13.5 % (ref 11.5–14.5)
WBC: 3.5 10*3/uL — AB (ref 3.6–11.0)

## 2016-10-06 LAB — BASIC METABOLIC PANEL
ANION GAP: 2 — AB (ref 5–15)
BUN: 24 mg/dL — ABNORMAL HIGH (ref 6–20)
CO2: 18 mmol/L — ABNORMAL LOW (ref 22–32)
Calcium: 7.2 mg/dL — ABNORMAL LOW (ref 8.9–10.3)
Chloride: 118 mmol/L — ABNORMAL HIGH (ref 101–111)
Creatinine, Ser: 3.82 mg/dL — ABNORMAL HIGH (ref 0.44–1.00)
GFR calc Af Amer: 17 mL/min — ABNORMAL LOW (ref >60)
GFR calc non Af Amer: 14 mL/min — ABNORMAL LOW (ref >60)
Glucose, Bld: 97 mg/dL (ref 65–99)
POTASSIUM: 4.6 mmol/L (ref 3.5–5.1)
SODIUM: 138 mmol/L (ref 135–145)

## 2016-10-06 LAB — TYPE AND SCREEN
ABO/RH(D): B POS
ANTIBODY SCREEN: NEGATIVE

## 2016-10-06 LAB — URINE CULTURE: Culture: <10000 — AB

## 2016-10-06 MED ORDER — LORAZEPAM 0.5 MG PO TABS
0.5000 mg | ORAL_TABLET | Freq: Once | ORAL | Status: AC
  Administered 2016-10-07: 0.5 mg via ORAL
  Filled 2016-10-06: qty 1

## 2016-10-06 MED ORDER — SODIUM CHLORIDE 0.9 % IV SOLN
750.0000 mg | Freq: Every day | INTRAVENOUS | Status: AC
  Administered 2016-10-06 – 2016-10-08 (×3): 750 mg via INTRAVENOUS
  Filled 2016-10-06 (×3): qty 6

## 2016-10-06 MED ORDER — HYDROXYCHLOROQUINE SULFATE 200 MG PO TABS
200.0000 mg | ORAL_TABLET | Freq: Every day | ORAL | Status: DC
  Administered 2016-10-07 – 2016-10-12 (×6): 200 mg via ORAL
  Filled 2016-10-06 (×6): qty 1

## 2016-10-06 NOTE — Consult Note (Signed)
Reason for Consult: Systemic lupus  Referring Physician: Hospitalist  Patricia Maldonado   HPI: 36 year old Afro-American female. Prior housecleaner. History of scarring rash beginning in 2014. Initial concern was that was related to allergies Developed alopecia with hair thinning shortly thereafter that 1 month ago at hospitalization for nausea and vomiting renal insufficiency. Evaluation showed kidney stone and UTI . At that time she had proteinuria and renal insufficiency. Creatinine came down to 1.8. Labs drawn at that time showed positive ANA, positive anti-Smith antibody. Low complement with low C3 and low C4. Positive anti-DNA antibody. She had recurrent nausea and vomiting and was rehospitalized with creatinine at 4.0. He lost 8 pounds of weight. Urine protein to creatinine ratio is elevated. 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 She's had her tubes tied No history of Raynaud's pleurisy pericarditis oral ulcers. Hands are occasionally stiff. Feet cramp and hands cramp.  PMH: Kidney stones  SURGICAL HISTORY: Tubal ligation  Family History: Family history is negative for lupus connective tissue diseases or kidney stones  Social History: Positive cigarettes. Positive marijuana.  denies IV drug use  Allergies:  Allergies  Allergen Reactions  . Penicillins     Yeast infection    Medications:  Scheduled: . cefUROXime  500 mg Oral Daily  . methylPREDNISolone (SOLU-MEDROL) injection  750 mg Intravenous Daily  . pantoprazole (PROTONIX) IV  40 mg Intravenous Q12H  . sucralfate  1 g Oral TID WC & HS        ROS:No recent photosensitivity. No recent chest pain. No recent abdominal pain. No bloody diarrhea. Face is been swelling. Occasional swelling of hands and legs. Otherwise per history of present illness   PHYSICAL EXAM: Blood pressure 109/63, pulse 71, temperature 98.4 F (36.9 C), temperature source Oral,  resp. rate 19, height 5\' 2"  (1.575 m), weight 45.4 kg (100 lb), last menstrual period 10/02/2016, SpO2 100 %. Pleasant female. 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 Oropharynx is clear. No significant adenopathy. Clear chest. No significant murmur or rub. Nontender abdomen. No presacral edema. No lower extremity edema Mild tenderness wrists MCPs. No sclerodactyly. No telangiectasias. 5 over 5 power  Assessment: Systemic lupus with manifestations of -Acute renal failure and proteinuria and abnormal sediment -Discoid skin changes -Alopecia -Thrombocytopenia -Anemia. Rule out hemolysis -Positive ANA, positive anti-DNA, positive anti-Smith antibodies, low complements  Bilateral nephrolithiasis Status post BTL Cigarettes and marijuana use     Recommendations: Agree with renal biopsy. Agree with IV steroids. Immunosuppression based on biopsy Will add labs. Lupus anticoagulant anticardiolipin antibody Coombs haptoglobin Will add Plaquenil  Patricia Maldonado 10/06/2016, 3:55 PM

## 2016-10-06 NOTE — Progress Notes (Signed)
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 Patient complaining of periorbital edema today. Intermittent left lower quadrant pain. Complaining of constipation. No fevers or chills. No flank pain. Urinary symptoms. Urine culture still pending.  Anti-infectives: Anti-infectives    Start     Dose/Rate Route Frequency Ordered Stop   10/06/16 1000  cefUROXime (CEFTIN) tablet 500 mg     500 mg Oral Daily 10/05/16 0953     10/04/16 2200  cefUROXime (CEFTIN) tablet 500 mg  Status:  Discontinued     500 mg Oral 2 times daily with meals 10/04/16 2150 10/05/16 0953      Current Facility-Administered Medications  Medication Dose Route Frequency Provider Last Rate Last Dose  . acetaminophen (TYLENOL) tablet 650 mg  650 mg Oral Q6H PRN Henreitta Leber, MD   650 mg at 10/05/16 2126   Or  . acetaminophen (TYLENOL) suppository 650 mg  650 mg Rectal Q6H PRN Henreitta Leber, MD      . cefUROXime (CEFTIN) tablet 500 mg  500 mg Oral Daily Theodoro Grist, MD   500 mg at 10/06/16 0841  . methylPREDNISolone sodium succinate (SOLU-MEDROL) 750 mg in sodium chloride 0.9 % 50 mL IVPB  750 mg Intravenous Daily Henreitta Leber, MD   750 mg at 10/06/16 1251  . morphine 4 MG/ML injection 2 mg  2 mg Intravenous Q4H PRN Henreitta Leber, MD   2 mg at 10/05/16 0019  . ondansetron (ZOFRAN) tablet 4 mg  4 mg Oral Q6H PRN Henreitta Leber, MD   4 mg at 10/05/16 0842   Or  . ondansetron (ZOFRAN) injection 4 mg  4 mg Intravenous Q6H PRN Henreitta Leber, MD   4 mg at 10/05/16 0019  . pantoprazole (PROTONIX) injection 40 mg  40 mg Intravenous Q12H Theodoro Grist, MD   40 mg at 10/06/16 0841  . sucralfate (CARAFATE) 1 GM/10ML suspension 1 g  1 g Oral TID WC & HS Theodoro Grist, MD   1 g at 10/06/16 0841     Objective: Vital signs in last 24 hours: Temp:  [98.1 F (36.7 C)-98.4 F (36.9 C)] 98.4 F (36.9 C) (01/04 1305) Pulse Rate:  [71-87] 71 (01/04 1305) Resp:  [19-20]  19 (01/04 1305) BP: (109-113)/(63-78) 109/63 (01/04 1305) SpO2:  [99 %-100 %] 100 % (01/04 1305)  Intake/Output from previous day: 01/03 0701 - 01/04 0700 In: 2565 [P.O.:240; I.V.:2325] Out: 400 [Urine:400] Intake/Output this shift: Total I/O In: 240 [P.O.:240] Out: 600 [Urine:600]   Physical Exam NAD Alert and oriented, no acute distress, tearful at times Periorbital edema appreciated, normocephalic atraumatic Periorbital rash appreciated Abdomen soft, nontender, nondistended No CVA tenderness bilaterally No large any edema or cyanosis  Lab Results:   Recent Labs  10/05/16 0547 10/06/16 0430  WBC 4.7 3.5*  HGB 8.4* 8.3*  HCT 25.4* 25.1*  PLT 130* 130*   BMET  Recent Labs  10/05/16 0547 10/06/16 0430  NA 139 138  K 4.8 4.6  CL 116* 118*  CO2 20* 18*  GLUCOSE 89 97  BUN 26* 24*  CREATININE 3.60* 3.82*  CALCIUM 7.4* 7.2*   PT/INR No results for input(s): LABPROT, INR in the last 72 hours. ABG No results for input(s): PHART, HCO3 in the last 72 hours.  Invalid input(s): PCO2, PO2  Studies/Results: Ct Abdomen Pelvis Wo Contrast  Result Date: 10/04/2016 CLINICAL DATA:  Intractable vomiting, recently seen in the ER and diagnosed with renal stones.  Continues to have emesis and nausea. EXAM: CT ABDOMEN AND PELVIS WITHOUT CONTRAST TECHNIQUE: Multidetector CT imaging of the abdomen and pelvis was performed following the standard protocol without IV contrast. COMPARISON:  08/31/2016 FINDINGS: Lower chest: Nonacute Hepatobiliary: No focal liver abnormality is seen. No gallstones, gallbladder wall thickening, or biliary dilatation. Pancreas: Unremarkable Spleen: Normal Adrenals/Urinary Tract: Unremarkable adrenal glands. Punctate nonobstructing calculus in the interpolar left kidney again noted measuring approximately 1 mm. No hydronephrosis on the left. Large staghorn calculus of the right kidney with smaller separate peripheral calcifications in the lower pole are again  noted with mild lower pole caliectasis and mild dilatation of the right renal pelvis. Findings are relatively similar to previous study. No ureteral dilatation. 2.1 cm cyst in the upper pole right kidney. Stomach/Bowel: The stomach is mildly distended with fluid. No small bowel dilatation is noted. Lack of oral contrast limits assessment. No bowel obstruction is seen. Moderate fecal residue within large bowel without obstruction. What is believed to be the appendix is normal. Vascular/Lymphatic: Normal caliber of the abdominal aorta. Nonpathologic sized retroperitoneal lymph nodes as well small bilateral inguinal nodes. Reproductive: Uterus and bilateral adnexa are unremarkable. Other: No abdominal wall hernia or abnormality. No abdominopelvic ascites. Musculoskeletal: No acute or significant osseous findings. IMPRESSION: Bilateral renal calculi without obstructive uropathy, staghorn on the right and punctate interpolar on the left. Lack of IV contrast limits assessment for pyelonephritis should be correlated clinically. Stable 2.1 cm upper pole right renal cyst. Lack of oral contrast and intra-abdominal fat limits assessment of small and large bowel. No bowel obstruction is identified. Electronically Signed   By: Shaqueta Casady Royalty M.D.   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  Urine culture still pending, suspect urinary changes related to nephritis and less likely urinary tract infection.  Plan:  1. Right staghorn calculus- patient encouraged to follow-up on 10/12/2016 as previously scheduled to arrange for right percutaneous nephrolithotomy, no plans for intervention during this admission unless becomes febrile/ develops leukocytosis, etc.  In that situation, would opt for percutaneous nephrostomy tube placement in anticipation of need for this for PCNL.  2. Possible UTI- UA appears suspicious for  possible infection, although glomerulonephritis could also cause similar changes in the urine. Agree with continuation of antibiotics, would discharge on by mouth antibiotics for at least 2 weeks in anticipation of upcoming stone manipulation surgery.  May need case management to help with financial issues leading to noncompliance with abx following last admission.  No leukocytosis or fevers to suggest severe pyelonephritis.  Follow-up urine culture.  3. 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 Biopsy possibly tomorrow with initiation of IV steroids today.  4. Abdominal pain/ nausea/ vomiting- unrelated to stone based on location and nature of pain.   suspect possible constipation.  Will continue to follow     LOS: 2 days    [name] 10/06/2016

## 2016-10-06 NOTE — Progress Notes (Signed)
Initial Nutrition Assessment  DOCUMENTATION CODES:   Severe malnutrition in context of acute illness/injury, Underweight  INTERVENTION:  Encouraged small, frequent meals.  Provide Boost Breeze po TID, each supplement provides 250 kcal and 9 grams of protein.  If medically appropriate, recommend advancing to Full Liquid Diet as patient requesting chocolate milk, which would help her meet calorie/protein needs.  NUTRITION DIAGNOSIS:   Malnutrition (Severe) related to acute illness as evidenced by 7 percent weight loss over 1 month, energy intake < or equal to 50% for > or equal to 5 days.  GOAL:   Patient will meet greater than or equal to 90% of their needs  MONITOR:   PO intake, Supplement acceptance, Diet advancement, Labs, Weight trends, I & O's  REASON FOR ASSESSMENT:   Consult Assessment of nutrition requirement/status  ASSESSMENT:   36 y.o. female with a known history of Acute kidney injury, staghorn calculus, recent history of urinary tract infection who presents to the hospital due to intractable nausea and vomiting for the past 2-3 days. Found to have acute kidney injury, bilateral nephrolithiasis. 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 Plan for renal biopsy.   Spoke with patient at bedside. She reports poor appetite. She has had ongoing N/V and abdominal pain for at least 3 days. She reports she is able to tolerate some foods, but she has not been eating her usual intake since before Thanksgiving. She can tolerate ice and gelatin well. Also reports she was able to tolerate 1/2 burger on day of admission. Patient reports she feels very dehydrated.  Reports UBW 108 lbs and that she has been losing weight. Patient has lost 8 lbs (7% body weight) over approximately 1 month per report, which is significant for time frame.  Medications reviewed and include: cefuroxime, methylprednisolone 750 mg daily, pantoprazole.  Labs reviewed: Chloride 118, CO2 18,  BUN 24, Creatinine 3.82, Anion gap 2, Calcium 7.2 (no albumin to correct).  Nutrition-Focused physical exam completed. Findings are no fat depletion, no muscle depletion, and no edema. Patient with swelling in orbital region.  Discussed with RN.  Diet Order:  Diet clear liquid Room service appropriate? Yes; Fluid consistency: Thin Diet NPO time specified  Skin:  Reviewed, no issues  Last BM:  10/03/2016  Height:   Ht Readings from Last 1 Encounters:  10/04/16 5\' 2"  (1.575 m)    Weight:   Wt Readings from Last 1 Encounters:  10/04/16 100 lb (45.4 kg)    Ideal Body Weight:  50 kg  BMI:  Body mass index is 18.29 kg/m.  Estimated Nutritional Needs:   Kcal:  1440-1680 (HBE x 1.2-1.4)  Protein:  55-65 grams (1.2-1.4 grams/kg)  Fluid:  >/= 1.5 L/day (35 ml/kg) or per MD  EDUCATION NEEDS:   No education needs identified at this time  Willey Blade, MS, RD, LDN Pager: [phone] After Hours Pager: [phone]

## 2016-10-06 NOTE — Progress Notes (Signed)
Subjective:   Doing fair today No significant change in clinical status Serum creatinine remains elevated at 3.80 No nausea or vomiting reported today  Objective:  Vital signs in last 24 hours:  Temp:  [98.1 F (36.7 C)-98.6 F (37 C)] 98.4 F (36.9 C) (01/04 0611) Pulse Rate:  [76-87] 76 (01/04 0611) Resp:  [18-20] 20 (01/04 6759) BP: (101-113)/(66-78) 113/77 (01/04 0611) SpO2:  [99 %-100 %] 99 % (01/04 0611)  Weight change:  Filed Weights   10/04/16 1727  Weight: 45.4 kg (100 lb)    Intake/Output:    Intake/Output Summary (Last 24 hours) at 10/06/16 1304 Last data filed at 10/06/16 1019  Gross per 24 hour  Intake             2419 ml  Output              250 ml  Net             2169 ml     Physical Exam: General: No acute distress, laying in the bed,   HEENT Hair loss noted over her eyelashes, moist oral mucous membranes , no oral ulcers  Neck Supple   Pulm/lungs Normal breathing effort, clear to auscultation   CVS/Heart Regular rate and rhythm, no rub or gallop   Abdomen:  Soft, nontender   Extremities: No peripheral edema   Neurologic: Alert, oriented   Skin: Non-confluent rash over face involving perioral area, right cheek, not typical butterfly rash of lupus            Basic Metabolic Panel:   Recent Labs Lab 10/04/16 1725 10/05/16 0547 10/06/16 0430  NA 141 139 138  K 5.0 4.8 4.6  CL 112* 116* 118*  CO2 22 20* 18*  GLUCOSE 97 89 97  BUN 31* 26* 24*  CREATININE 4.07* 3.60* 3.82*  CALCIUM 8.3* 7.4* 7.2*     CBC:  Recent Labs Lab 10/04/16 1725 10/05/16 0547 10/06/16 0430  WBC 6.5 4.7 3.5*  HGB 10.2* 8.4* 8.3*  HCT 30.3* 25.4* 25.1*  MCV 89.7 90.3 92.3  PLT 174 130* 130*      Microbiology:  No results found for this or any previous visit (from the past 720 hour(s)).  Coagulation Studies: No results for input(s): LABPROT, INR in the last 72 hours.  Urinalysis:  Recent Labs  10/05/16 1219  COLORURINE YELLOW*  LABSPEC  1.013  PHURINE 7.0  GLUCOSEU NEGATIVE  HGBUR MODERATE*  BILIRUBINUR NEGATIVE  KETONESUR NEGATIVE  PROTEINUR >=300*  NITRITE NEGATIVE  LEUKOCYTESUR TRACE*      Imaging: Ct Abdomen Pelvis Wo Contrast  Result Date: 10/04/2016 CLINICAL DATA:  Intractable vomiting, recently seen in the ER and diagnosed with renal stones. Continues to have emesis and nausea. EXAM: CT ABDOMEN AND PELVIS WITHOUT CONTRAST TECHNIQUE: Multidetector CT imaging of the abdomen and pelvis was performed following the standard protocol without IV contrast. COMPARISON:  08/31/2016 FINDINGS: Lower chest: Nonacute Hepatobiliary: No focal liver abnormality is seen. No gallstones, gallbladder wall thickening, or biliary dilatation. Pancreas: Unremarkable Spleen: Normal Adrenals/Urinary Tract: Unremarkable adrenal glands. Punctate nonobstructing calculus in the interpolar left kidney again noted measuring approximately 1 mm. No hydronephrosis on the left. Large staghorn calculus of the right kidney with smaller separate peripheral calcifications in the lower pole are again noted with mild lower pole caliectasis and mild dilatation of the right renal pelvis. Findings are relatively similar to previous study. No ureteral dilatation. 2.1 cm cyst in the upper pole right kidney. Stomach/Bowel: The stomach  is mildly distended with fluid. No small bowel dilatation is noted. Lack of oral contrast limits assessment. No bowel obstruction is seen. Moderate fecal residue within large bowel without obstruction. What is believed to be the appendix is normal. Vascular/Lymphatic: Normal caliber of the abdominal aorta. Nonpathologic sized retroperitoneal lymph nodes as well small bilateral inguinal nodes. Reproductive: Uterus and bilateral adnexa are unremarkable. Other: No abdominal wall hernia or abnormality. No abdominopelvic ascites. Musculoskeletal: No acute or significant osseous findings. IMPRESSION: Bilateral renal calculi without obstructive  uropathy, staghorn on the right and punctate interpolar on the left. Lack of IV contrast limits assessment for pyelonephritis should be correlated clinically. Stable 2.1 cm upper pole right renal cyst. Lack of oral contrast and intra-abdominal fat limits assessment of small and large bowel. No bowel obstruction is identified. Electronically Signed   By: Ashley Royalty M.D.   On: 10/04/2016 19:17     Medications:    . cefUROXime  500 mg Oral Daily  . methylPREDNISolone (SOLU-MEDROL) injection  750 mg Intravenous Daily  . pantoprazole (PROTONIX) IV  40 mg Intravenous Q12H  . sucralfate  1 g Oral TID WC & HS   acetaminophen **OR** acetaminophen, morphine injection, ondansetron **OR** ondansetron (ZOFRAN) IV  Assessment/ Plan:  36 y.o. female with recent acute renal failure, left staghorn calculus, urinary tract infection, presents for nausea vomiting and decreased oral intake for 3 days  1. Recurrent acute renal failure 2. 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 Recent UTI 5. Hematuria  Previous workup from November 2017 shows positive double-stranded DNA, ENA, positive double-stranded DNA,positive  ENA and chromatin antibodies, low C3 and C4, positive ribonucleoprotein antibodies Myeloperoxidase , anti-GBM antibodies are negative. ASO titer was elevated Urine protein to creatinine ratio was 5.6 g; 11 gm this admission Urinalysis shows too numerous to count RBCs and WBCs Urine culture is pending; negative to date  Immunology profile suggests that patient has active lupus nephritis. Started on iv Solumedrol today. 750 mg daily iv x 3 Tentatively kidney biopsy tomorrow Risks, benefits and alternatives were discussed with the patient. She has agreed to proceed. Patient is very emotional over her health condition. Feels depressed.     LOS: 2 Trasean Delima 1/4/20181:04 PM

## 2016-10-06 NOTE — Progress Notes (Signed)
[facility] [place] NAME: Patricia Maldonado    MR#:  [id number]  DATE OF BIRTH:  02-10-1981  SUBJECTIVE:   Patient admitted to the hospital due to persistent nausea vomiting abdominal pain and noted to be in acute renal failure. Renal function slightly improved. 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 Plan for renal biopsy tomorrow.   REVIEW OF SYSTEMS:    Review of Systems  Constitutional: Negative for chills and fever.  HENT: Negative for congestion and tinnitus.   Eyes: Negative for blurred vision and double vision.  Respiratory: Negative for cough, shortness of breath and wheezing.   Cardiovascular: Negative for chest pain, orthopnea and PND.  Gastrointestinal: Positive for abdominal pain. Negative for diarrhea, nausea and vomiting.  Genitourinary: Negative for dysuria and hematuria.  Neurological: Negative for dizziness, sensory change and focal weakness.  All other systems reviewed and are negative.   Nutrition: Clear Liquid Tolerating Diet: Yes Tolerating PT: Ambulatory  DRUG ALLERGIES:   Allergies  Allergen Reactions  . Penicillins     Yeast infection    VITALS:  Blood pressure 109/63, pulse 71, temperature 98.4 F (36.9 C), temperature source Oral, resp. rate 19, height 5\' 2"  (1.575 m), weight 45.4 kg (100 lb), last menstrual period 10/02/2016, SpO2 100 %.  PHYSICAL EXAMINATION:   Physical Exam  GENERAL:  36 y.o.-year-old patient lying in the bed in no acute distress.  EYES: Pupils equal, round, reactive to light and accommodation, peri-orbital swelling. No scleral icterus. Extraocular muscles intact.  HEENT: Head atraumatic, normocephalic. Oropharynx and nasopharynx clear.  NECK:  Supple, no jugular venous distention. No thyroid enlargement, no tenderness.  LUNGS: Normal breath sounds bilaterally, no wheezing, rales, rhonchi. No use of accessory muscles of  respiration.  CARDIOVASCULAR: S1, S2 normal. No murmurs, rubs, or gallops.  ABDOMEN: Soft, nontender, nondistended. Bowel sounds present. No organomegaly or mass.  EXTREMITIES: No cyanosis, clubbing or edema b/l.    NEUROLOGIC: Cranial nerves II through XII are intact. No focal Motor or sensory deficits b/l.   PSYCHIATRIC: The patient is alert and oriented x 3.  SKIN: discoid rash on the faces, lesion, or ulcer.    LABORATORY PANEL:   CBC  Recent Labs Lab 10/06/16 0430  WBC 3.5*  HGB 8.3*  HCT 25.1*  PLT 130*   ------------------------------------------------------------------------------------------------------------------  Chemistries   Recent Labs Lab 10/04/16 1725  10/06/16 0430  NA 141  < > 138  K 5.0  < > 4.6  CL 112*  < > 118*  CO2 22  < > 18*  GLUCOSE 97  < > 97  BUN 31*  < > 24*  CREATININE 4.07*  < > 3.82*  CALCIUM 8.3*  < > 7.2*  AST 20  --   --   ALT 10*  --   --   ALKPHOS 69  --   --   BILITOT 0.4  --   --   < > = values in this interval not displayed. ------------------------------------------------------------------------------------------------------------------  Cardiac Enzymes No results for input(s): TROPONINI in the last 168 hours. ------------------------------------------------------------------------------------------------------------------  RADIOLOGY:  Ct Abdomen Pelvis Wo Contrast  Result Date: 10/04/2016 CLINICAL DATA:  Intractable vomiting, recently seen in the ER and diagnosed with renal stones. Continues to have emesis and nausea. EXAM: CT ABDOMEN AND PELVIS WITHOUT CONTRAST TECHNIQUE: Multidetector CT imaging of the abdomen and pelvis was performed following the standard protocol without IV contrast. COMPARISON:  08/31/2016  FINDINGS: Lower chest: Nonacute Hepatobiliary: No focal liver abnormality is seen. No gallstones, gallbladder wall thickening, or biliary dilatation. Pancreas: Unremarkable Spleen: Normal Adrenals/Urinary Tract:  Unremarkable adrenal glands. Punctate nonobstructing calculus in the interpolar left kidney again noted measuring approximately 1 mm. No hydronephrosis on the left. Large staghorn calculus of the right kidney with smaller separate peripheral calcifications in the lower pole are again noted with mild lower pole caliectasis and mild dilatation of the right renal pelvis. Findings are relatively similar to previous study. No ureteral dilatation. 2.1 cm cyst in the upper pole right kidney. Stomach/Bowel: The stomach is mildly distended with fluid. No small bowel dilatation is noted. Lack of oral contrast limits assessment. No bowel obstruction is seen. Moderate fecal residue within large bowel without obstruction. What is believed to be the appendix is normal. Vascular/Lymphatic: Normal caliber of the abdominal aorta. Nonpathologic sized retroperitoneal lymph nodes as well small bilateral inguinal nodes. Reproductive: Uterus and bilateral adnexa are unremarkable. Other: No abdominal wall hernia or abnormality. No abdominopelvic ascites. Musculoskeletal: No acute or significant osseous findings. IMPRESSION: Bilateral renal calculi without obstructive uropathy, staghorn on the right and punctate interpolar on the left. Lack of IV contrast limits assessment for pyelonephritis should be correlated clinically. Stable 2.1 cm upper pole right renal cyst. Lack of oral contrast and intra-abdominal fat limits assessment of small and large bowel. No bowel obstruction is identified. Electronically Signed   By: Ashley Royalty M.D.   On: 10/04/2016 19:17     ASSESSMENT AND PLAN:   36 year old female with past medical history of Staghorn calculus, Rash, ARF who is admitted to the hospital due to abdominal pain, nausea vomiting and noted to be in acute kidney injury.  1. 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 Patient's double-stranded DNA and ANA was positive. -Started on high-dose IV steroids. Plan for  ultrasound-guided renal biopsy tomorrow. -Continue further care as per nephrology. -Follow BUN and creatinine. Next  2. Lupus-patient was positive for double-stranded DNA and also her ANA was positive. -Obtained a rheumatology consult and she has a discoid lupus rash. They have added Plaquenil. -Further serologic workup including lupus anticoagulant, Coombs antibody haptoglobin has been checked. Continue immunosuppression and follow up with rheumatology as an outpatient.  3. History of Staghorn calculus-no evidence of obstruction. -Seen by urology and will need outpatient follow-up. Continue empiric Ceftin.  4. GERD-continue Protonix.  5. Nausea/vomiting-etiology unclear, seen by gastroenterology and no plans for any further intervention, likely related to underlying lupus. Continue supportive care with antiemetics.  6. 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 Will follow counts.   All the records are reviewed and case discussed with Care Management/Social Worker. Management plans discussed with the patient, family and they are in agreement.  CODE STATUS: Full Code  DVT Prophylaxis: Ted's & SCD's.   TOTAL TIME TAKING CARE OF THIS PATIENT: 35 minutes.   POSSIBLE D/C IN 2-3 DAYS, DEPENDING ON CLINICAL CONDITION.   Henreitta Leber M.D on 10/06/2016 at 4:16 PM  Between 7am to 6pm - Pager - [phone]  After 6pm go to [email or web address] - [employer] [facility]  Office  [phone]  CC: Primary care physician; No PCP Per Patient

## 2016-10-07 ENCOUNTER — Inpatient Hospital Stay: Payer: Medicaid Other

## 2016-10-07 LAB — CBC WITH DIFFERENTIAL/PLATELET
Basophils Absolute: 0 10*3/uL (ref 0–0.1)
Basophils Relative: 0 %
EOS ABS: 0 10*3/uL (ref 0–0.7)
Eosinophils Relative: 0 %
HCT: 27.8 % — ABNORMAL LOW (ref 35.0–47.0)
HEMOGLOBIN: 9.4 g/dL — AB (ref 12.0–16.0)
LYMPHS ABS: 0.7 10*3/uL — AB (ref 1.0–3.6)
Lymphocytes Relative: 22 %
MCH: 30.9 pg (ref 26.0–34.0)
MCHC: 33.7 g/dL (ref 32.0–36.0)
MCV: 91.7 fL (ref 80.0–100.0)
MONOS PCT: 3 %
Monocytes Absolute: 0.1 10*3/uL — ABNORMAL LOW (ref 0.2–0.9)
NEUTROS ABS: 2.3 10*3/uL (ref 1.4–6.5)
NEUTROS PCT: 75 %
Platelets: 145 10*3/uL — ABNORMAL LOW (ref 150–440)
RBC: 3.03 MIL/uL — ABNORMAL LOW (ref 3.80–5.20)
RDW: 13.7 % (ref 11.5–14.5)
WBC: 3.1 10*3/uL — ABNORMAL LOW (ref 3.6–11.0)

## 2016-10-07 LAB — PROTIME-INR
INR: 1.06
Prothrombin Time: 13.8 seconds (ref 11.4–15.2)

## 2016-10-07 LAB — LUPUS ANTICOAGULANT PANEL
DRVVT: 32.4 s (ref 0.0–47.0)
PTT Lupus Anticoagulant: 33.7 s (ref 0.0–51.9)

## 2016-10-07 LAB — CBC
HEMATOCRIT: 27.9 % — AB (ref 35.0–47.0)
Hemoglobin: 9.3 g/dL — ABNORMAL LOW (ref 12.0–16.0)
MCH: 30.4 pg (ref 26.0–34.0)
MCHC: 33.5 g/dL (ref 32.0–36.0)
MCV: 90.7 fL (ref 80.0–100.0)
Platelets: 160 10*3/uL (ref 150–440)
RBC: 3.08 MIL/uL — ABNORMAL LOW (ref 3.80–5.20)
RDW: 13.3 % (ref 11.5–14.5)
WBC: 6.7 10*3/uL (ref 3.6–11.0)

## 2016-10-07 LAB — CARDIOLIPIN ANTIBODIES, IGG, IGM, IGA
Anticardiolipin IgA: <9 APL U/mL (ref 0–11)
Anticardiolipin IgM: <9 MPL U/mL (ref 0–12)

## 2016-10-07 LAB — APTT: APTT: 33 s (ref 24–36)

## 2016-10-07 LAB — HAPTOGLOBIN: Haptoglobin: 216 mg/dL — ABNORMAL HIGH (ref 34–200)

## 2016-10-07 MED ORDER — SODIUM CHLORIDE 0.9 % IV SOLN
INTRAVENOUS | Status: DC
  Administered 2016-10-07: 13:00:00 via INTRAVENOUS

## 2016-10-07 MED ORDER — OXYCODONE-ACETAMINOPHEN 5-325 MG PO TABS
1.0000 | ORAL_TABLET | ORAL | Status: DC | PRN
Start: 2016-10-07 — End: 2016-10-12
  Administered 2016-10-07 – 2016-10-09 (×3): 1 via ORAL
  Filled 2016-10-07: qty 1
  Filled 2016-10-07: qty 2
  Filled 2016-10-07 (×3): qty 1

## 2016-10-07 MED ORDER — ACETAMINOPHEN 500 MG PO TABS: 500.0000 mg | ORAL_TABLET | Freq: Four times a day (QID) | ORAL | Status: DC | PRN

## 2016-10-07 NOTE — Procedures (Signed)
PROCEDURE: Informed written consent was obtained from the patient after a discussion of the risks, benefits and alternatives to treatment. The patient understands and consents the procedure. A timeout was performed prior to the initiation of the procedure.  Ultrasound scanning was performed of the bilateral flanks. The inferior pole of the LEFT kidney was selected for biopsy due to location and sonographic window. The procedure was planned. The operative site was prepped and draped in the usual sterile fashion. The overlying soft tissues were anesthetized with 10 mL of 1% XYLOCAINE-EPI 1:100,000.  An 18 gauge core needle biopsy device was advanced into the inferior cortex of the LEFT kidney and TWO core biopsies were obtained under direct ultrasound guidance. Real time pathologic review confirmed adequate tissue acquisition. Images were saved for documentation purposes. The biopsy device was removed and hemostasis was obtained with manual compression. Post procedural scanning was positive for a moderate sized hematoma. A dressing was placed. The patient tolerated the procedure well and sent to room in an stable condition.   We will check serial hemoglobins.  Bed rest with bathroom privileges No aspirin/NSAIDs for next 5-7 days No heparin for next 48 hrs

## 2016-10-07 NOTE — Progress Notes (Signed)
Urology Consult Follow Up  Subjective: Seen by rheumatology yesterday. Continues to be quite emotional about diagnosis. Denies any flank pain.  No fevers or chills.  Anti-infectives: Anti-infectives    Start     Dose/Rate Route Frequency Ordered Stop   10/07/16 1000  hydroxychloroquine (PLAQUENIL) tablet 200 mg     200 mg Oral Daily 10/06/16 1609     10/06/16 1000  cefUROXime (CEFTIN) tablet 500 mg     500 mg Oral Daily 10/05/16 0953     10/04/16 2200  cefUROXime (CEFTIN) tablet 500 mg  Status:  Discontinued     500 mg Oral 2 times daily with meals 10/04/16 2150 10/05/16 0953      Current Facility-Administered Medications  Medication Dose Route Frequency Provider Last Rate Last Dose  . acetaminophen (TYLENOL) tablet 650 mg  650 mg Oral Q6H PRN Henreitta Leber, MD   650 mg at 10/05/16 2126   Or  . acetaminophen (TYLENOL) suppository 650 mg  650 mg Rectal Q6H PRN Henreitta Leber, MD      . cefUROXime (CEFTIN) tablet 500 mg  500 mg Oral Daily Theodoro Grist, MD   500 mg at 10/06/16 0841  . hydroxychloroquine (PLAQUENIL) tablet 200 mg  200 mg Oral Daily [name]., MD      . LORazepam (ATIVAN) tablet 0.5 mg  0.5 mg Oral Once Murlean Iba, MD      . methylPREDNISolone sodium succinate (SOLU-MEDROL) 750 mg in sodium chloride 0.9 % 50 mL IVPB  750 mg Intravenous Daily Henreitta Leber, MD   750 mg at 10/06/16 1251  . morphine 4 MG/ML injection 2 mg  2 mg Intravenous Q4H PRN Henreitta Leber, MD   2 mg at 10/05/16 0019  . ondansetron (ZOFRAN) tablet 4 mg  4 mg Oral Q6H PRN Henreitta Leber, MD   4 mg at 10/05/16 0842   Or  . ondansetron (ZOFRAN) injection 4 mg  4 mg Intravenous Q6H PRN Henreitta Leber, MD   4 mg at 10/05/16 0019  . pantoprazole (PROTONIX) injection 40 mg  40 mg Intravenous Q12H Theodoro Grist, MD   40 mg at 10/06/16 2127  . sucralfate (CARAFATE) 1 GM/10ML suspension 1 g  1 g Oral TID WC & HS Theodoro Grist, MD   1 g at 10/06/16 2127     Objective: Vital signs in  last 24 hours: Temp:  [98 F (36.7 C)-98.4 F (36.9 C)] 98.4 F (36.9 C) (01/05 0532) Pulse Rate:  [71-92] 83 (01/05 0532) Resp:  [18-20] 18 (01/05 0532) BP: (100-116)/(63-80) 116/80 (01/05 0532) SpO2:  [99 %-100 %] 99 % (01/05 0532)  Intake/Output from previous day: 01/04 0701 - 01/05 0700 In: 536 [P.O.:480; IV Piggyback:56] Out: 1250 [Urine:1250] Intake/Output this shift: No intake/output data recorded.   Physical Exam NAD Alert and oriented, no acute distress, tearful at times Periorbital edema appreciated, normocephalic atraumatic Periorbital rash appreciated Abdomen soft, nontender, nondistended No CVA tenderness bilaterally No large any edema or cyanosis  Lab Results:   Recent Labs  10/06/16 0430 10/07/16 0419  WBC 3.5* 3.1*  HGB 8.3* 9.4*  HCT 25.1* 27.8*  PLT 130* 145*   BMET  Recent Labs  10/05/16 0547 10/06/16 0430  NA 139 138  K 4.8 4.6  CL 116* 118*  CO2 20* 18*  GLUCOSE 89 97  BUN 26* 24*  CREATININE 3.60* 3.82*  CALCIUM 7.4* 7.2*   PT/INR  Recent Labs  10/07/16 0419  LABPROT 13.8  INR 1.06   ABG No results for input(s): PHART, HCO3 in the last 72 hours.  Invalid input(s): PCO2, PO2  Studies/Results: No results found.   Assessment: 36 year old female with acute renal failure likely secondary to lupus nephritis and partial right staghorn.  Urine culture growing less than 10,000 colonies of insignificant growth.  Plan:  1. Right staghorn calculus- patient encouraged to follow-up on 10/12/2016 as previously scheduled to arrange for right percutaneous nephrolithotomy, no plans for intervention during this admission unless becomes febrile/ develops leukocytosis, etc.  In that situation, would opt for percutaneous nephrostomy tube placement in anticipation of need for this for PCNL.  2. Possible UTI- UA appears suspicious for possible infection, although glomerulonephritis could also cause similar changes in the urine. Agree with  continuation of antibiotics, would discharge on by mouth antibiotics for at least 2 weeks in anticipation of upcoming stone manipulation surgery.  3. Acute renal failure-likely secondary to lupus nephritis. Started on IV Solu-Medrol yesterday, kidney biopsy today.  4. Abdominal pain/ nausea/ vomiting- unrelated to stone based on location and nature of pain.   suspect possible constipation.  We will sign off today, please contact us with any questions or concerns. Please have patient see [place] as an outpatient as scheduled next week.     LOS: 3 days    [name] 10/07/2016

## 2016-10-07 NOTE — Progress Notes (Signed)
Patient seen post biopsy No gross hematuria  No Pain reported over biopsy site In good spirits Family in the room  Vitals:   10/07/16 1205 10/07/16 1309  BP: (!) 124/96 109/78  Pulse: 92 95  Resp: 20 19  Temp:  97.7 F (36.5 C)

## 2016-10-07 NOTE — Progress Notes (Signed)
Subjective:   Doing fair today No significant change in clinical status Serum creatinine remains elevated at 3.82 Nervous about the kidney biopsy  Objective:  Vital signs in last 24 hours:  Temp:  [98 F (36.7 C)-98.4 F (36.9 C)] 98.1 F (36.7 C) (01/05 0908) Pulse Rate:  [71-92] 73 (01/05 0908) Resp:  [16-20] 16 (01/05 0908) BP: (100-119)/(63-82) 119/82 (01/05 0908) SpO2:  [99 %-100 %] 100 % (01/05 0908)  Weight change:  Filed Weights   10/04/16 1727  Weight: 45.4 kg (100 lb)    Intake/Output:    Intake/Output Summary (Last 24 hours) at 10/07/16 0949 Last data filed at 10/07/16 0620  Gross per 24 hour  Intake              536 ml  Output             1250 ml  Net             -714 ml     Physical Exam: General: No acute distress, laying in the bed,   HEENT alopecia, moist oral mucous membranes , no oral ulcers, periorbital edema  Neck Supple   Pulm/lungs Normal breathing effort, clear to auscultation   CVS/Heart Regular rate and rhythm, no rub or gallop   Abdomen:  Soft, nontender   Extremities: trace peripheral edema   Neurologic: Alert, oriented   Skin: Rash over face            Basic Metabolic Panel:   Recent Labs Lab 10/04/16 1725 10/05/16 0547 10/06/16 0430  NA 141 139 138  K 5.0 4.8 4.6  CL 112* 116* 118*  CO2 22 20* 18*  GLUCOSE 97 89 97  BUN 31* 26* 24*  CREATININE 4.07* 3.60* 3.82*  CALCIUM 8.3* 7.4* 7.2*     CBC:  Recent Labs Lab 10/04/16 1725 10/05/16 0547 10/06/16 0430 10/07/16 0419  WBC 6.5 4.7 3.5* 3.1*  NEUTROABS  --   --   --  2.3  HGB 10.2* 8.4* 8.3* 9.4*  HCT 30.3* 25.4* 25.1* 27.8*  MCV 89.7 90.3 92.3 91.7  PLT 174 130* 130* 145*      Microbiology:  Recent Results (from the past 720 hour(s))  Urine culture     Status: Abnormal   Collection Time: 10/05/16 12:19 PM  Result Value Ref Range Status   Specimen Description URINE, CATHETERIZED  Final   Special Requests NONE  Final   Culture (A)  Final   <10,000 COLONIES/mL INSIGNIFICANT GROWTH Performed at [facility]    Report Status 10/06/2016 FINAL  Final    Coagulation Studies:  Recent Labs  10/07/16 0419  LABPROT 13.8  INR 1.06    Urinalysis:  Recent Labs  10/05/16 1219  COLORURINE YELLOW*  LABSPEC 1.013  PHURINE 7.0  GLUCOSEU NEGATIVE  HGBUR MODERATE*  BILIRUBINUR NEGATIVE  KETONESUR NEGATIVE  PROTEINUR >=300*  NITRITE NEGATIVE  LEUKOCYTESUR TRACE*      Imaging: No results found.   Medications:    . cefUROXime  500 mg Oral Daily  . hydroxychloroquine  200 mg Oral Daily  . methylPREDNISolone (SOLU-MEDROL) injection  750 mg Intravenous Daily  . pantoprazole (PROTONIX) IV  40 mg Intravenous Q12H  . sucralfate  1 g Oral TID WC & HS   acetaminophen **OR** acetaminophen, morphine injection, ondansetron **OR** ondansetron (ZOFRAN) IV  Assessment/ Plan:  36 y.o. female with recent acute renal failure, left staghorn calculus, urinary tract infection, presents for nausea vomiting and decreased oral intake for 3 days  1. Recurrent acute renal failure 2. 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 Recent UTI- urine culture neg  5. Hematuria  Previous workup from November 2017 shows positive ENA, positive double-stranded DNA, chromatin antibodies, low C3 and C4, positive ribonucleoprotein antibodies Myeloperoxidase , anti-GBM antibodies are negative. ASO titer was elevated Urine protein to creatinine ratio was 5.6 g; 11 gm this admission Urinalysis shows too numerous to count RBCs and WBCs Urine culture is pending; negative to date  Immunology profile suggests that patient has active lupus nephritis. Started on iv Solumedrol 750 mg daily iv x 3 Kidney biopsy today   Results for QUIERA, DIFFEE (MRN [id number]) as of 10/07/2016 09:53  Ref. Range 08/31/2016 16:39  Anit Nuclear Antibody(ANA) Latest Ref Range:  Negative  Positive (A)  ANCA Proteinase 3 Latest Ref Range: 0.0 - 3.5 U/mL <3.5  Anti JO-1 Latest Ref Range: 0.0 - 0.9 AI <0.2  ASO Latest Ref Range: 0.0 - 200.0 IU/mL 221.0 (H)  CENTROMERE AB SCREEN Latest Ref Range: 0.0 - 0.9 AI <0.2  ds DNA Ab Latest Ref Range: 0 - 9 IU/mL 23 (H)  GBM Ab Latest Ref Range: 0 - 20 units 3  Myeloperoxidase Abs Latest Ref Range: 0.0 - 9.0 U/mL <9.0  ENA SM Ab Ser-aCnc Latest Ref Range: 0.0 - 0.9 AI >8.0 (H)  Chromatin Ab SerPl-aCnc Latest Ref Range: 0.0 - 0.9 AI >8.0 (H)  C3 Complement Latest Ref Range: 82 - 167 mg/dL 73 (L)  Complement C4, Body Fluid Latest Ref Range: 14 - 44 mg/dL 12 (L)  Alpha-1-Globulin Latest Ref Range: 0.0 - 0.4 g/dL 0.2  Alpha-2-Globulin Latest Ref Range: 0.4 - 1.0 g/dL 1.0  Ribonucleic Protein Latest Ref Range: 0.0 - 0.9 AI >8.0 (H)  SSA (Ro) (ENA) Antibody, IgG Latest Ref Range: 0.0 - 0.9 AI <0.2  SSB (La) (ENA) Antibody, IgG Latest Ref Range: 0.0 - 0.9 AI <0.2  Scleroderma (Scl-70) (ENA) Antibody, IgG Latest Ref Range: 0.0 - 0.9 AI <0.2      LOS: 3 Abriana Saltos 1/5/20189:49 AM

## 2016-10-07 NOTE — Progress Notes (Signed)
She denies any rectal bleeding or hematemesis. Noted that she is being treated for Lupus and is planned for a renal biopsy., Denies any significant abdominal pain. No present indication for any endoscopy.  I will sign off.  Please call me if any further GI concerns or questions.  We would like to thank you for the opportunity to participate in the care of [place].   Dr [name]  Gastroenterology/Hepatology Pager: [phone]

## 2016-10-07 NOTE — Progress Notes (Signed)
[facility] [place] NAME: Patricia Maldonado    MR#:  [id number]  DATE OF BIRTH:  07-Feb-1981  SUBJECTIVE:   Feels a bit better today. Puffiness round eye has improved. Going for Renal biopsy today.  Family at bedside. No other complaints.    REVIEW OF SYSTEMS:    Review of Systems  Constitutional: Negative for chills and fever.  HENT: Negative for congestion and tinnitus.   Eyes: Negative for blurred vision and double vision.  Respiratory: Negative for cough, shortness of breath and wheezing.   Cardiovascular: Negative for chest pain, orthopnea and PND.  Gastrointestinal: Negative for abdominal pain, diarrhea, nausea and vomiting.  Genitourinary: Negative for dysuria and hematuria.  Neurological: Negative for dizziness, sensory change and focal weakness.  All other systems reviewed and are negative.   Nutrition: Clear Liquid Tolerating Diet: Yes Tolerating PT: Ambulatory  DRUG ALLERGIES:   Allergies  Allergen Reactions  . Penicillins     Yeast infection    VITALS:  Blood pressure 109/78, pulse 95, temperature 97.7 F (36.5 C), temperature source Oral, resp. rate 19, height 5\' 2"  (1.575 m), weight 45.4 kg (100 lb), last menstrual period 10/02/2016, SpO2 100 %.  PHYSICAL EXAMINATION:   Physical Exam  GENERAL:  36 y.o.-year-old patient lying in the bed in no acute distress.  EYES: Pupils equal, round, reactive to light and accommodation, peri-orbital swelling. No scleral icterus. Extraocular muscles intact.  HEENT: Head atraumatic, normocephalic. Oropharynx and nasopharynx clear.  NECK:  Supple, no jugular venous distention. No thyroid enlargement, no tenderness.  LUNGS: Normal breath sounds bilaterally, no wheezing, rales, rhonchi. No use of accessory muscles of respiration.  CARDIOVASCULAR: S1, S2 normal. No murmurs, rubs, or gallops.  ABDOMEN: Soft, nontender, nondistended. Bowel sounds present. No organomegaly or mass.   EXTREMITIES: No cyanosis, clubbing or edema b/l.    NEUROLOGIC: Cranial nerves II through XII are intact. No focal Motor or sensory deficits b/l.   PSYCHIATRIC: The patient is alert and oriented x 3.  SKIN: discoid rash on the faces, lesion, or ulcer.    LABORATORY PANEL:   CBC  Recent Labs Lab 10/07/16 0419  WBC 3.1*  HGB 9.4*  HCT 27.8*  PLT 145*   ------------------------------------------------------------------------------------------------------------------  Chemistries   Recent Labs Lab 10/04/16 1725  10/06/16 0430  NA 141  < > 138  K 5.0  < > 4.6  CL 112*  < > 118*  CO2 22  < > 18*  GLUCOSE 97  < > 97  BUN 31*  < > 24*  CREATININE 4.07*  < > 3.82*  CALCIUM 8.3*  < > 7.2*  AST 20  --   --   ALT 10*  --   --   ALKPHOS 69  --   --   BILITOT 0.4  --   --   < > = values in this interval not displayed. ------------------------------------------------------------------------------------------------------------------  Cardiac Enzymes No results for input(s): TROPONINI in the last 168 hours. ------------------------------------------------------------------------------------------------------------------  RADIOLOGY:  US Biopsy  Result Date: 10/07/2016 Murlean Iba, MD     10/07/2016 12:16 PM PROCEDURE: Informed written consent was obtained from the patient after a discussion of the risks, benefits and alternatives to treatment. The patient understands and consents the procedure. A timeout was performed prior to the initiation of the procedure.  Ultrasound scanning was performed of the bilateral flanks. The inferior pole of the LEFT kidney was selected for biopsy due to location and sonographic window. The  procedure was planned. The operative site was prepped and draped in the usual sterile fashion. The overlying soft tissues were anesthetized with 10 mL of 1% XYLOCAINE-EPI 1:100,000.  An 18 gauge core needle biopsy device was advanced into the inferior cortex of the  LEFT kidney and TWO core biopsies were obtained under direct ultrasound guidance. Real time pathologic review confirmed adequate tissue acquisition. Images were saved for documentation purposes. The biopsy device was removed and hemostasis was obtained with manual compression. Post procedural scanning was positive for a moderate sized hematoma. A dressing was placed. The patient tolerated the procedure well and sent to room in an stable condition. We will check serial hemoglobins. Bed rest with bathroom privileges No aspirin/NSAIDs for next 5-7 days No heparin for next 48 hrs     ASSESSMENT AND PLAN:   36 year old female with past medical history of Staghorn calculus, Rash, ARF who is admitted to the hospital due to abdominal pain, nausea vomiting and noted to be in acute kidney injury.  1. 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 Patient's double-stranded DNA and ANA was positive. - cont. High dose IV steroids. S/p Renal biopsy today.   -Continue further care as per nephrology. -Follow BUN and creatinine.  2. Lupus-patient was positive for double-stranded DNA and also her ANA was positive. - appreciate Rheumatology input. Cont. Plaquenil. - Continue immunosuppression and follow up with rheumatology as an outpatient.  3. History of Staghorn calculus-no evidence of obstruction. -Seen by urology and will need outpatient follow-up unless becomes symptomatic with fever, Leukocytosis. Continue empiric Ceftin.  4. GERD-continue Protonix.  5. Nausea/vomiting-etiology unclear, seen by gastroenterology and no plans for any further intervention, likely related to underlying lupus. Continue supportive care with antiemetics.  6. 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 Counts stable and will monitor.   All the records are reviewed and case discussed with Care Management/Social Worker. Management plans discussed with the patient, family and they are in agreement.  CODE STATUS: Full  Code  DVT Prophylaxis: Ted's & SCD's.   TOTAL TIME TAKING CARE OF THIS PATIENT: 30 minutes.   Discussed plan of care with patient's family at bedside.   POSSIBLE D/C IN 2-3 DAYS, DEPENDING ON CLINICAL CONDITION.   Henreitta Leber M.D on 10/07/2016 at 3:49 PM  Between 7am to 6pm - Pager - [phone]  After 6pm go to [email or web address] - [employer] [facility]  Office  [phone]  CC: Primary care physician; No PCP Per Patient

## 2016-10-07 NOTE — Care Management (Signed)
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  Patient provided application for medication management and ODC.   Patient will require assistance with medications at discharge.  She was unable to obtain her medications previous discharge. RNCM following

## 2016-10-08 LAB — BASIC METABOLIC PANEL
ANION GAP: 4 — AB (ref 5–15)
BUN: 44 mg/dL — ABNORMAL HIGH (ref 6–20)
CHLORIDE: 115 mmol/L — AB (ref 101–111)
CO2: 16 mmol/L — ABNORMAL LOW (ref 22–32)
Calcium: 7.4 mg/dL — ABNORMAL LOW (ref 8.9–10.3)
Creatinine, Ser: 4.27 mg/dL — ABNORMAL HIGH (ref 0.44–1.00)
GFR calc Af Amer: 15 mL/min — ABNORMAL LOW (ref >60)
GFR calc non Af Amer: 13 mL/min — ABNORMAL LOW (ref >60)
GLUCOSE: 124 mg/dL — AB (ref 65–99)
POTASSIUM: 5.3 mmol/L — AB (ref 3.5–5.1)
Sodium: 135 mmol/L (ref 135–145)

## 2016-10-08 LAB — CBC
HCT: 25.9 % — ABNORMAL LOW (ref 35.0–47.0)
HEMOGLOBIN: 8.7 g/dL — AB (ref 12.0–16.0)
MCH: 30.6 pg (ref 26.0–34.0)
MCHC: 33.7 g/dL (ref 32.0–36.0)
MCV: 90.8 fL (ref 80.0–100.0)
PLATELETS: 149 10*3/uL — AB (ref 150–440)
RBC: 2.85 MIL/uL — AB (ref 3.80–5.20)
RDW: 13.6 % (ref 11.5–14.5)
WBC: 8.1 10*3/uL (ref 3.6–11.0)

## 2016-10-08 MED ORDER — DOCUSATE SODIUM 100 MG PO CAPS
100.0000 mg | ORAL_CAPSULE | Freq: Two times a day (BID) | ORAL | Status: DC
  Administered 2016-10-08 – 2016-10-11 (×4): 100 mg via ORAL
  Filled 2016-10-08 (×9): qty 1

## 2016-10-08 MED ORDER — PREDNISONE 20 MG PO TABS
60.0000 mg | ORAL_TABLET | Freq: Every day | ORAL | Status: DC
  Administered 2016-10-09 – 2016-10-12 (×4): 60 mg via ORAL
  Filled 2016-10-08 (×4): qty 3

## 2016-10-08 NOTE — Progress Notes (Signed)
Patient ID: Patricia Maldonado, female   DOB: Nov 22, 1980, 36 y.o.   MRN: [id number]  [facility] PROGRESS NOTE  WEDA BAUMGARNER FFM:[id number] DOB: 1981/06/15 DOA: 10/04/2016 PCP: No PCP Per Patient  HPI/Subjective: Patient seen earlier and was feeling okay. She states she's been urinating. She's been tolerating her diet and eating more and no further nausea or vomiting. This afternoon she had an episode of severe abdominal pain and had a bowel movement and then have difficulty moving her legs but as per the nurse she was able to move her legs.  Objective: Vitals:   10/08/16 0816 10/08/16 1249  BP: 116/82 117/84  Pulse: 80 87  Resp: 18 20  Temp: 97.5 F (36.4 C) 98.2 F (36.8 C)    Filed Weights   10/04/16 1727  Weight: 45.4 kg (100 lb)    ROS: Review of Systems  Constitutional: Negative for chills and fever.  Eyes: Negative for blurred vision.  Respiratory: Negative for cough and shortness of breath.   Cardiovascular: Negative for chest pain.  Gastrointestinal: Positive for abdominal pain and constipation. Negative for diarrhea, nausea and vomiting.  Genitourinary: Negative for dysuria.  Musculoskeletal: Negative for joint pain.  Skin: Positive for rash.  Neurological: Negative for dizziness and headaches.   Exam: Physical Exam  Constitutional: She is oriented to person, place, and time.  HENT:  Nose: No mucosal edema.  Mouth/Throat: No oropharyngeal exudate or posterior oropharyngeal edema.  Eyes: Conjunctivae, EOM and lids are normal. Pupils are equal, round, and reactive to light.  Neck: No JVD present. Carotid bruit is not present. No edema present. No thyroid mass and no thyromegaly present.  Cardiovascular: S1 normal and S2 normal.  Exam reveals no gallop.   No murmur heard. Pulses:      Dorsalis pedis pulses are 2+ on the right side, and 2+ on the left side.  Respiratory: No respiratory distress. She has no wheezes. She has no rhonchi. She has no rales.  GI:  Soft. Bowel sounds are normal. There is no tenderness.  Musculoskeletal:       Right ankle: She exhibits no swelling.       Left ankle: She exhibits no swelling.  Lymphadenopathy:    She has no cervical adenopathy.  Neurological: She is alert and oriented to person, place, and time. No cranial nerve deficit.  Skin: Skin is warm. Nails show no clubbing.  Rash on the right side of her face and left side of her lip  Psychiatric: She has a normal mood and affect.      Data Reviewed: Basic Metabolic Panel:  Recent Labs Lab 10/04/16 1725 10/05/16 0547 10/06/16 0430 10/08/16 0553  NA 141 139 138 135  K 5.0 4.8 4.6 5.3*  CL 112* 116* 118* 115*  CO2 22 20* 18* 16*  GLUCOSE 97 89 97 124*  BUN 31* 26* 24* 44*  CREATININE 4.07* 3.60* 3.82* 4.27*  CALCIUM 8.3* 7.4* 7.2* 7.4*   Liver Function Tests:  Recent Labs Lab 10/04/16 1725  AST 20  ALT 10*  ALKPHOS 69  BILITOT 0.4  PROT 5.7*  ALBUMIN 1.7*    Recent Labs Lab 10/04/16 1725  LIPASE 17   CBC:  Recent Labs Lab 10/05/16 0547 10/06/16 0430 10/07/16 0419 10/07/16 1704 10/08/16 0553  WBC 4.7 3.5* 3.1* 6.7 8.1  NEUTROABS  --   --  2.3  --   --   HGB 8.4* 8.3* 9.4* 9.3* 8.7*  HCT 25.4* 25.1* 27.8* 27.9* 25.9*  MCV  90.3 92.3 91.7 90.7 90.8  PLT 130* 130* 145* 160 149*   Cardiac Enzymes:  Recent Labs Lab 10/05/16 0547  CKTOTAL 74     Recent Results (from the past 240 hour(s))  Urine culture     Status: Abnormal   Collection Time: 10/05/16 12:19 PM  Result Value Ref Range Status   Specimen Description URINE, CATHETERIZED  Final   Special Requests NONE  Final   Culture (A)  Final    <10,000 COLONIES/mL INSIGNIFICANT GROWTH Performed at [facility]    Report Status 10/06/2016 FINAL  Final     Studies: US Biopsy  Result Date: 2016-10-16 Murlean Iba, MD     2016-10-16 12:16 PM PROCEDURE: Informed written consent was obtained from the patient after a discussion of the risks, benefits and  alternatives to treatment. The patient understands and consents the procedure. A timeout was performed prior to the initiation of the procedure.  Ultrasound scanning was performed of the bilateral flanks. The inferior pole of the LEFT kidney was selected for biopsy due to location and sonographic window. The procedure was planned. The operative site was prepped and draped in the usual sterile fashion. The overlying soft tissues were anesthetized with 10 mL of 1% XYLOCAINE-EPI 1:100,000.  An 18 gauge core needle biopsy device was advanced into the inferior cortex of the LEFT kidney and TWO core biopsies were obtained under direct ultrasound guidance. Real time pathologic review confirmed adequate tissue acquisition. Images were saved for documentation purposes. The biopsy device was removed and hemostasis was obtained with manual compression. Post procedural scanning was positive for a moderate sized hematoma. A dressing was placed. The patient tolerated the procedure well and sent to room in an stable condition. We will check serial hemoglobins. Bed rest with bathroom privileges No aspirin/NSAIDs for next 5-7 days No heparin for next 48 hrs    Scheduled Meds: . cefUROXime  500 mg Oral Daily  . docusate sodium  100 mg Oral BID  . hydroxychloroquine  200 mg Oral Daily  . pantoprazole (PROTONIX) IV  40 mg Intravenous Q12H  . [START ON 10/09/2016] predniSONE  60 mg Oral Q breakfast  . sucralfate  1 g Oral TID WC & HS   Continuous Infusions: . sodium chloride 10 mL/hr at 10/08/16 1133    Assessment/Plan:  1. 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 Patient has an ANA and double-stranded DNA that's positive. Still waiting to hear back from kidney biopsy results. Patient finished 3 days of high-dose IV steroids. Prednisone 60 mg daily starting tomorrow. Creatinine has not improved much. Creatinine will need to be better prior to disposition. 2. Lupus. Patient started on Plaquenil by rheumatology.  We'll need to consider immunosuppression as outpatient. 3. 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 Patient will need outpatient follow-up with urology. Patient on empiric Ceftin. 4. GERD on Protonix 5. Nausea vomiting improved 6. 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 Hyperkalemia secondary to acute kidney injury. No treatment at this point increase oral hydration. 8. Constipation and abdominal pain. Patient had a bowel movement this afternoon. Continue to monitor.  Code Status:     Code Status Orders        Start     Ordered   10/04/16 2151  Full code  Continuous     10/04/16 2150    Code Status History    Date Active Date Inactive Code Status Order ID Comments User Context   08/30/2016  7:13 PM 09/04/2016  2:43 PM Full Code [id number]  Lytle Butte, MD ED     Disposition Plan: Creatinine will need to improve prior to disposition  Consultants:  Nephrology  Urology  Rheumatology  Procedures:  Kidney biopsy  Antibiotics:  Keflex  Time spent: 27 minutes  [place], [facility]

## 2016-10-08 NOTE — Progress Notes (Signed)
Patient up to bathroom - had bowel movement. Complained of abdominal pain. Ambulated from bathroom to bed. Stated she could not move her legs to get them into bed.  Once in bed, checked and she had strong pedal pushes and flexion.  Medication given for pain. Bed alarm activated.  Notified MD via messages. Lauris Poag 10/08/16 1600

## 2016-10-08 NOTE — Progress Notes (Signed)
Subjective:   Doing fair today No significant change in clinical status Serum creatinine slightly worse at 4.27 Urine is slightly blood-tinged Does not complain of any severe pain Biopsy site is nontender but back is stiff and sore due to laying in the bed  Objective:  Vital signs in last 24 hours:  Temp:  [97.5 F (36.4 C)-98.8 F (37.1 C)] 98.2 F (36.8 C) (01/06 1249) Pulse Rate:  [80-89] 87 (01/06 1249) Resp:  [18-20] 20 (01/06 1249) BP: (116-117)/(78-84) 117/84 (01/06 1249) SpO2:  [98 %-99 %] 98 % (01/06 1249)  Weight change:  Filed Weights   10/04/16 1727  Weight: 45.4 kg (100 lb)    Intake/Output:    Intake/Output Summary (Last 24 hours) at 10/08/16 1409 Last data filed at 10/08/16 1133  Gross per 24 hour  Intake             1579 ml  Output             1350 ml  Net              229 ml     Physical Exam: General: No acute distress, laying in the bed,   HEENT alopecia, moist oral mucous membranes , no oral ulcers, periorbital edema  Neck Supple   Pulm/lungs Normal breathing effort, clear to auscultation   CVS/Heart Regular rate and rhythm, no rub or gallop   Abdomen:  Soft, nontender , biopsy site non tender  Extremities: trace peripheral edema   Neurologic: Alert, oriented   Skin: Rash over face            Basic Metabolic Panel:   Recent Labs Lab 10/04/16 1725 10/05/16 0547 10/06/16 0430 10/08/16 0553  NA 141 139 138 135  K 5.0 4.8 4.6 5.3*  CL 112* 116* 118* 115*  CO2 22 20* 18* 16*  GLUCOSE 97 89 97 124*  BUN 31* 26* 24* 44*  CREATININE 4.07* 3.60* 3.82* 4.27*  CALCIUM 8.3* 7.4* 7.2* 7.4*     CBC:  Recent Labs Lab 10/05/16 0547 10/06/16 0430 10/07/16 0419 10/07/16 1704 10/08/16 0553  WBC 4.7 3.5* 3.1* 6.7 8.1  NEUTROABS  --   --  2.3  --   --   HGB 8.4* 8.3* 9.4* 9.3* 8.7*  HCT 25.4* 25.1* 27.8* 27.9* 25.9*  MCV 90.3 92.3 91.7 90.7 90.8  PLT 130* 130* 145* 160 149*      Microbiology:  Recent Results (from the past  720 hour(s))  Urine culture     Status: Abnormal   Collection Time: 10/05/16 12:19 PM  Result Value Ref Range Status   Specimen Description URINE, CATHETERIZED  Final   Special Requests NONE  Final   Culture (A)  Final    <10,000 COLONIES/mL INSIGNIFICANT GROWTH Performed at [facility]    Report Status 10/06/2016 FINAL  Final    Coagulation Studies:  Recent Labs  10/07/16 0419  LABPROT 13.8  INR 1.06    Urinalysis: No results for input(s): COLORURINE, LABSPEC, PHURINE, GLUCOSEU, HGBUR, BILIRUBINUR, KETONESUR, PROTEINUR, UROBILINOGEN, NITRITE, LEUKOCYTESUR in the last 72 hours.  Invalid input(s): APPERANCEUR    Imaging: US Biopsy  Result Date: 10/07/2016 Murlean Iba, MD     10/07/2016 12:16 PM PROCEDURE: Informed written consent was obtained from the patient after a discussion of the risks, benefits and alternatives to treatment. The patient understands and consents the procedure. A timeout was performed prior to the initiation of the procedure.  Ultrasound scanning was performed of the bilateral  flanks. The inferior pole of the LEFT kidney was selected for biopsy due to location and sonographic window. The procedure was planned. The operative site was prepped and draped in the usual sterile fashion. The overlying soft tissues were anesthetized with 10 mL of 1% XYLOCAINE-EPI 1:100,000.  An 18 gauge core needle biopsy device was advanced into the inferior cortex of the LEFT kidney and TWO core biopsies were obtained under direct ultrasound guidance. Real time pathologic review confirmed adequate tissue acquisition. Images were saved for documentation purposes. The biopsy device was removed and hemostasis was obtained with manual compression. Post procedural scanning was positive for a moderate sized hematoma. A dressing was placed. The patient tolerated the procedure well and sent to room in an stable condition. We will check serial hemoglobins. Bed rest with bathroom privileges  No aspirin/NSAIDs for next 5-7 days No heparin for next 48 hrs     Medications:   . sodium chloride 10 mL/hr at 10/08/16 1133   . cefUROXime  500 mg Oral Daily  . docusate sodium  100 mg Oral BID  . hydroxychloroquine  200 mg Oral Daily  . pantoprazole (PROTONIX) IV  40 mg Intravenous Q12H  . [START ON 10/09/2016] predniSONE  60 mg Oral Q breakfast  . sucralfate  1 g Oral TID WC & HS   [DISCONTINUED] acetaminophen **OR** acetaminophen, acetaminophen, morphine injection, ondansetron **OR** ondansetron (ZOFRAN) IV, oxyCODONE-acetaminophen  Assessment/ Plan:  36 y.o. female with recent acute renal failure, left staghorn calculus, urinary tract infection, presents for nausea vomiting and decreased oral intake for 3 days  1. Recurrent acute renal failure 2. 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 Recent UTI- urine culture neg  5. Hematuria  Previous workup from November 2017 shows positive ENA, positive double-stranded DNA, chromatin antibodies, low C3 and C4, positive ribonucleoprotein antibodies Myeloperoxidase , anti-GBM antibodies are negative. ASO titer was elevated Urine protein to creatinine ratio was 5.6 g; 11 gm this admission Urinalysis shows too numerous to count RBCs and WBCs Urine culture is pending; negative to date  Immunology profile suggests that patient has active lupus nephritis. Started on iv Solumedrol 750 mg daily iv x 3 Kidney biopsy done on 10/08/15. About 3 cm post procedure hematoma Monitor Hgb closely Start prednisone 60 mg daily starting tomorrow Await Biopsy results   Results for NOVALEE, HORSFALL (MRN [id number]) as of 10/07/2016 09:53  Ref. Range 08/31/2016 16:39  Anit Nuclear Antibody(ANA) Latest Ref Range: Negative  Positive (A)  ANCA Proteinase 3 Latest Ref Range: 0.0 - 3.5 U/mL <3.5  Anti JO-1 Latest Ref Range: 0.0 - 0.9 AI <0.2  ASO Latest Ref Range: 0.0  - 200.0 IU/mL 221.0 (H)  CENTROMERE AB SCREEN Latest Ref Range: 0.0 - 0.9 AI <0.2  ds DNA Ab Latest Ref Range: 0 - 9 IU/mL 23 (H)  GBM Ab Latest Ref Range: 0 - 20 units 3  Myeloperoxidase Abs Latest Ref Range: 0.0 - 9.0 U/mL <9.0  ENA SM Ab Ser-aCnc Latest Ref Range: 0.0 - 0.9 AI >8.0 (H)  Chromatin Ab SerPl-aCnc Latest Ref Range: 0.0 - 0.9 AI >8.0 (H)  C3 Complement Latest Ref Range: 82 - 167 mg/dL 73 (L)  Complement C4, Body Fluid Latest Ref Range: 14 - 44 mg/dL 12 (L)  Alpha-1-Globulin Latest Ref Range: 0.0 - 0.4 g/dL 0.2  Alpha-2-Globulin Latest Ref Range: 0.4 - 1.0 g/dL 1.0  Ribonucleic Protein Latest Ref Range: 0.0 - 0.9 AI >  8.0 (H)  SSA (Ro) (ENA) Antibody, IgG Latest Ref Range: 0.0 - 0.9 AI <0.2  SSB (La) (ENA) Antibody, IgG Latest Ref Range: 0.0 - 0.9 AI <0.2  Scleroderma (Scl-70) (ENA) Antibody, IgG Latest Ref Range: 0.0 - 0.9 AI <0.2      LOS: 4 Haniya Fern 1/6/20182:09 PM

## 2016-10-09 LAB — BASIC METABOLIC PANEL
ANION GAP: 4 — AB (ref 5–15)
BUN: 56 mg/dL — ABNORMAL HIGH (ref 6–20)
CHLORIDE: 114 mmol/L — AB (ref 101–111)
CO2: 17 mmol/L — ABNORMAL LOW (ref 22–32)
Calcium: 7.5 mg/dL — ABNORMAL LOW (ref 8.9–10.3)
Creatinine, Ser: 4.21 mg/dL — ABNORMAL HIGH (ref 0.44–1.00)
GFR calc non Af Amer: 13 mL/min — ABNORMAL LOW (ref >60)
GFR, EST AFRICAN AMERICAN: 15 mL/min — AB (ref >60)
Glucose, Bld: 100 mg/dL — ABNORMAL HIGH (ref 65–99)
POTASSIUM: 4.9 mmol/L (ref 3.5–5.1)
Sodium: 135 mmol/L (ref 135–145)

## 2016-10-09 LAB — HEMOGLOBIN: HEMOGLOBIN: 9.1 g/dL — AB (ref 12.0–16.0)

## 2016-10-09 NOTE — Progress Notes (Signed)
Subjective:   Doing fair today No significant change in clinical status Serum creatinine Stabilizing at 4.2/GFR 15 Urine is slightly blood-tinged Does not complain of any pain today States that she is able to eat without nausea or vomiting Hemoglobin 9.1  Objective:  Vital signs in last 24 hours:  Temp:  [97.6 F (36.4 C)-98.1 F (36.7 C)] 98.1 F (36.7 C) (01/07 1200) Pulse Rate:  [70-87] 87 (01/07 1200) Resp:  [18] 18 (01/07 1200) BP: (108-133)/(74-88) 133/83 (01/07 1200) SpO2:  [99 %-100 %] 100 % (01/07 1200)  Weight change:  Filed Weights   10/04/16 1727  Weight: 45.4 kg (100 lb)    Intake/Output:    Intake/Output Summary (Last 24 hours) at 10/09/16 1302 Last data filed at 10/09/16 0800  Gross per 24 hour  Intake              204 ml  Output              450 ml  Net             -246 ml     Physical Exam: General: No acute distress, laying in the bed,   HEENT alopecia, moist oral mucous membranes , no oral ulcers, periorbital edema  Neck Supple   Pulm/lungs Normal breathing effort, clear to auscultation   CVS/Heart Regular rate and rhythm, no rub or gallop   Abdomen:  Soft, nontender , biopsy site non tender  Extremities: trace peripheral edema   Neurologic: Alert, oriented   Skin: Rash over face            Basic Metabolic Panel:   Recent Labs Lab 10/04/16 1725 10/05/16 0547 10/06/16 0430 10/08/16 0553 10/09/16 0838  NA 141 139 138 135 135  K 5.0 4.8 4.6 5.3* 4.9  CL 112* 116* 118* 115* 114*  CO2 22 20* 18* 16* 17*  GLUCOSE 97 89 97 124* 100*  BUN 31* 26* 24* 44* 56*  CREATININE 4.07* 3.60* 3.82* 4.27* 4.21*  CALCIUM 8.3* 7.4* 7.2* 7.4* 7.5*     CBC:  Recent Labs Lab 10/05/16 0547 10/06/16 0430 10/07/16 0419 10/07/16 1704 10/08/16 0553 10/09/16 0838  WBC 4.7 3.5* 3.1* 6.7 8.1  --   NEUTROABS  --   --  2.3  --   --   --   HGB 8.4* 8.3* 9.4* 9.3* 8.7* 9.1*  HCT 25.4* 25.1* 27.8* 27.9* 25.9*  --   MCV 90.3 92.3 91.7 90.7 90.8   --   PLT 130* 130* 145* 160 149*  --       Microbiology:  Recent Results (from the past 720 hour(s))  Urine culture     Status: Abnormal   Collection Time: 10/05/16 12:19 PM  Result Value Ref Range Status   Specimen Description URINE, CATHETERIZED  Final   Special Requests NONE  Final   Culture (A)  Final    <10,000 COLONIES/mL INSIGNIFICANT GROWTH Performed at [facility]    Report Status 10/06/2016 FINAL  Final    Coagulation Studies:  Recent Labs  10/07/16 0419  LABPROT 13.8  INR 1.06    Urinalysis: No results for input(s): COLORURINE, LABSPEC, PHURINE, GLUCOSEU, HGBUR, BILIRUBINUR, KETONESUR, PROTEINUR, UROBILINOGEN, NITRITE, LEUKOCYTESUR in the last 72 hours.  Invalid input(s): APPERANCEUR    Imaging: No results found.   Medications:   . sodium chloride 10 mL/hr at 10/08/16 1133   . cefUROXime  500 mg Oral Daily  . docusate sodium  100 mg Oral BID  .  hydroxychloroquine  200 mg Oral Daily  . pantoprazole (PROTONIX) IV  40 mg Intravenous Q12H  . predniSONE  60 mg Oral Q breakfast  . sucralfate  1 g Oral TID WC & HS   [DISCONTINUED] acetaminophen **OR** acetaminophen, acetaminophen, morphine injection, ondansetron **OR** ondansetron (ZOFRAN) IV, oxyCODONE-acetaminophen  Assessment/ Plan:  36 y.o. female with recent acute renal failure, left staghorn calculus, urinary tract infection, presents for nausea vomiting and decreased oral intake for 3 days  1. Recurrent acute renal failure 2. 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 Recent UTI- urine culture neg  5. Hematuria  Previous workup from November 2017 shows positive ENA, positive double-stranded DNA, chromatin antibodies, low C3 and C4, positive ribonucleoprotein antibodies Myeloperoxidase , anti-GBM antibodies are negative. 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 Given iv Solumedrol 750 mg daily iv x 3 (1/4-1/6), started on prednisone 60 mg daily 1/7 Kidney biopsy done on 10/08/15. About 3 cm post procedure hematoma Monitor Hgb closely   Await Biopsy results   Results for Patricia Maldonado, Patricia Maldonado (MRN [id number]) as of 10/07/2016 09:53  Ref. Range 08/31/2016 16:39  Anit Nuclear Antibody(ANA) Latest Ref Range: Negative  Positive (A)  ANCA Proteinase 3 Latest Ref Range: 0.0 - 3.5 U/mL <3.5  Anti JO-1 Latest Ref Range: 0.0 - 0.9 AI <0.2  ASO Latest Ref Range: 0.0 - 200.0 IU/mL 221.0 (H)  CENTROMERE AB SCREEN Latest Ref Range: 0.0 - 0.9 AI <0.2  ds DNA Ab Latest Ref Range: 0 - 9 IU/mL 23 (H)  GBM Ab Latest Ref Range: 0 - 20 units 3  Myeloperoxidase Abs Latest Ref Range: 0.0 - 9.0 U/mL <9.0  ENA SM Ab Ser-aCnc Latest Ref Range: 0.0 - 0.9 AI >8.0 (H)  Chromatin Ab SerPl-aCnc Latest Ref Range: 0.0 - 0.9 AI >8.0 (H)  C3 Complement Latest Ref Range: 82 - 167 mg/dL 73 (L)  Complement C4, Body Fluid Latest Ref Range: 14 - 44 mg/dL 12 (L)  Alpha-1-Globulin Latest Ref Range: 0.0 - 0.4 g/dL 0.2  Alpha-2-Globulin Latest Ref Range: 0.4 - 1.0 g/dL 1.0  Ribonucleic Protein Latest Ref Range: 0.0 - 0.9 AI >8.0 (H)  SSA (Ro) (ENA) Antibody, IgG Latest Ref Range: 0.0 - 0.9 AI <0.2  SSB (La) (ENA) Antibody, IgG Latest Ref Range: 0.0 - 0.9 AI <0.2  Scleroderma (Scl-70) (ENA) Antibody, IgG Latest Ref Range: 0.0 - 0.9 AI <0.2      LOS: 5 Patricia Maldonado 1/7/20181:02 PM

## 2016-10-09 NOTE — Progress Notes (Signed)
Patient ID: Patricia Maldonado, female   DOB: [birth year], 36 y.o.   MRN: [id number]  [facility] PROGRESS NOTE  KEIOSHA CANCRO TOI:[id number] DOB: 01-Jun-1981 DOA: 10/04/2016 PCP: No PCP Per Patient  HPI/Subjective: Patient feeling okay. Offers no complaints. States she is urinating well. States she is eating and drinking well.  Objective: Vitals:   10/09/16 0816 10/09/16 1200  BP: 110/74 133/83  Pulse: 70 87  Resp: 18 18  Temp: 97.7 F (36.5 C) 98.1 F (36.7 C)    Filed Weights   10/04/16 1727  Weight: 45.4 kg (100 lb)    ROS: Review of Systems  Constitutional: Negative for chills and fever.  Eyes: Negative for blurred vision.  Respiratory: Negative for cough and shortness of breath.   Cardiovascular: Negative for chest pain.  Gastrointestinal: Positive for abdominal pain and constipation. Negative for diarrhea, nausea and vomiting.  Genitourinary: Negative for dysuria.  Musculoskeletal: Negative for joint pain.  Skin: Positive for rash.  Neurological: Negative for dizziness and headaches.   Exam: Physical Exam  Constitutional: She is oriented to person, place, and time.  HENT:  Nose: No mucosal edema.  Mouth/Throat: No oropharyngeal exudate or posterior oropharyngeal edema.  Eyes: Conjunctivae, EOM and lids are normal. Pupils are equal, round, and reactive to light.  Neck: No JVD present. Carotid bruit is not present. No edema present. No thyroid mass and no thyromegaly present.  Cardiovascular: S1 normal and S2 normal.  Exam reveals no gallop.   No murmur heard. Pulses:      Dorsalis pedis pulses are 2+ on the right side, and 2+ on the left side.  Respiratory: No respiratory distress. She has no wheezes. She has no rhonchi. She has no rales.  GI: Soft. Bowel sounds are normal. There is no tenderness.  Musculoskeletal:       Right ankle: She exhibits no swelling.       Left ankle: She exhibits no swelling.  Lymphadenopathy:    She has no cervical adenopathy.   Neurological: She is alert and oriented to person, place, and time. No cranial nerve deficit.  Skin: Skin is warm. Nails show no clubbing.  Rash on the right side of her face and left side of her lip  Psychiatric: She has a normal mood and affect.      Data Reviewed: Basic Metabolic Panel:  Recent Labs Lab 10/04/16 1725 10/05/16 0547 10/06/16 0430 10/08/16 0553 10/09/16 0838  NA 141 139 138 135 135  K 5.0 4.8 4.6 5.3* 4.9  CL 112* 116* 118* 115* 114*  CO2 22 20* 18* 16* 17*  GLUCOSE 97 89 97 124* 100*  BUN 31* 26* 24* 44* 56*  CREATININE 4.07* 3.60* 3.82* 4.27* 4.21*  CALCIUM 8.3* 7.4* 7.2* 7.4* 7.5*   Liver Function Tests:  Recent Labs Lab 10/04/16 1725  AST 20  ALT 10*  ALKPHOS 69  BILITOT 0.4  PROT 5.7*  ALBUMIN 1.7*    Recent Labs Lab 10/04/16 1725  LIPASE 17   CBC:  Recent Labs Lab 10/05/16 0547 10/06/16 0430 10/07/16 0419 10/07/16 1704 10/08/16 0553 10/09/16 0838  WBC 4.7 3.5* 3.1* 6.7 8.1  --   NEUTROABS  --   --  2.3  --   --   --   HGB 8.4* 8.3* 9.4* 9.3* 8.7* 9.1*  HCT 25.4* 25.1* 27.8* 27.9* 25.9*  --   MCV 90.3 92.3 91.7 90.7 90.8  --   PLT 130* 130* 145* 160 149*  --  Cardiac Enzymes:  Recent Labs Lab 10/05/16 0547  CKTOTAL 74     Recent Results (from the past 240 hour(s))  Urine culture     Status: Abnormal   Collection Time: 10/05/16 12:19 PM  Result Value Ref Range Status   Specimen Description URINE, CATHETERIZED  Final   Special Requests NONE  Final   Culture (A)  Final    <10,000 COLONIES/mL INSIGNIFICANT GROWTH Performed at [facility]    Report Status 10/06/2016 FINAL  Final     Scheduled Meds: . cefUROXime  500 mg Oral Daily  . docusate sodium  100 mg Oral BID  . hydroxychloroquine  200 mg Oral Daily  . pantoprazole (PROTONIX) IV  40 mg Intravenous Q12H  . predniSONE  60 mg Oral Q breakfast  . sucralfate  1 g Oral TID WC & HS      Assessment/Plan:  1. 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 Patient has an ANA and double-stranded DNA that's positive. Still waiting to hear back from kidney biopsy results. Patient finished 3 days of high-dose IV steroids. Prednisone 60 mg daily. Creatinine has not improved much.  2. Lupus. Patient started on Plaquenil by rheumatology. We'll need to consider immunosuppression as outpatient. 3. 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 Patient will need outpatient follow-up with urology. Patient on empiric Ceftin. 4. GERD on Protonix 5. Nausea vomiting improved 6. 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 Hyperkalemia secondary to acute kidney injury. No treatment at this point increase oral hydration. 8. Constipation and abdominal pain. Patient had a bowel movement this afternoon. Continue to monitor.  Code Status:     Code Status Orders        Start     Ordered   10/04/16 2151  Full code  Continuous     10/04/16 2150    Code Status History    Date Active Date Inactive Code Status Order ID Comments User Context   08/30/2016  7:13 PM 09/04/2016  2:43 PM Full Code [id number]  Lytle Butte, MD ED     Disposition Plan: Creatinine will need to improve prior to disposition  Consultants:  Nephrology  Urology  Rheumatology  Procedures:  Kidney biopsy  Antibiotics:  Keflex  Time spent: 24 minutes. Case discussed with family at the bedside.  Loletha Grayer  [employer]

## 2016-10-10 MED ORDER — ENSURE ENLIVE PO LIQD
237.0000 mL | Freq: Two times a day (BID) | ORAL | Status: DC
  Administered 2016-10-10 – 2016-10-12 (×4): 237 mL via ORAL

## 2016-10-10 NOTE — Progress Notes (Signed)
Per Dr. Manuella Ghazi okay to remove old orders involving patient to stay in bed on her back.

## 2016-10-10 NOTE — Progress Notes (Signed)
Patient ID: Patricia Maldonado, female   DOB: 21-Apr-1981, 36 y.o.   MRN: [id number]  [facility] PROGRESS NOTE  Patricia Maldonado XFG:[id number] DOB: June 07, 1981 DOA: 10/04/2016 PCP: No PCP Per Patient  HPI/Subjective: No new c/s, waiting for biopsy results  Objective: Vitals:   10/10/16 1844 10/10/16 2005  BP: 117/80 120/86  Pulse: 95 82  Resp:  16  Temp: 98.1 F (36.7 C) 97.6 F (36.4 C)    Filed Weights   10/04/16 1727  Weight: 45.4 kg (100 lb)    ROS: Review of Systems  Constitutional: Negative for chills and fever.  Eyes: Negative for blurred vision.  Respiratory: Negative for cough and shortness of breath.   Cardiovascular: Negative for chest pain.  Gastrointestinal: Positive for abdominal pain and constipation. Negative for diarrhea, nausea and vomiting.  Genitourinary: Negative for dysuria.  Musculoskeletal: Negative for joint pain.  Skin: Positive for rash.  Neurological: Negative for dizziness and headaches.   Exam: Physical Exam  Constitutional: She is oriented to person, place, and time.  HENT:  Nose: No mucosal edema.  Mouth/Throat: No oropharyngeal exudate or posterior oropharyngeal edema.  Eyes: Conjunctivae, EOM and lids are normal. Pupils are equal, round, and reactive to light.  Neck: No JVD present. Carotid bruit is not present. No edema present. No thyroid mass and no thyromegaly present.  Cardiovascular: S1 normal and S2 normal.  Exam reveals no gallop.   No murmur heard. Pulses:      Dorsalis pedis pulses are 2+ on the right side, and 2+ on the left side.  Respiratory: No respiratory distress. She has no wheezes. She has no rhonchi. She has no rales.  GI: Soft. Bowel sounds are normal. There is no tenderness.  Musculoskeletal:       Right ankle: She exhibits no swelling.       Left ankle: She exhibits no swelling.  Lymphadenopathy:    She has no cervical adenopathy.  Neurological: She is alert and oriented to person, place, and time. No cranial  nerve deficit.  Skin: Skin is warm. Nails show no clubbing.  Rash on the right side of her face and left side of her lip  Psychiatric: She has a normal mood and affect.      Data Reviewed: Basic Metabolic Panel:  Recent Labs Lab 10/04/16 1725 10/05/16 0547 10/06/16 0430 10/08/16 0553 10/09/16 0838  NA 141 139 138 135 135  K 5.0 4.8 4.6 5.3* 4.9  CL 112* 116* 118* 115* 114*  CO2 22 20* 18* 16* 17*  GLUCOSE 97 89 97 124* 100*  BUN 31* 26* 24* 44* 56*  CREATININE 4.07* 3.60* 3.82* 4.27* 4.21*  CALCIUM 8.3* 7.4* 7.2* 7.4* 7.5*   Liver Function Tests:  Recent Labs Lab 10/04/16 1725  AST 20  ALT 10*  ALKPHOS 69  BILITOT 0.4  PROT 5.7*  ALBUMIN 1.7*    Recent Labs Lab 10/04/16 1725  LIPASE 17   CBC:  Recent Labs Lab 10/05/16 0547 10/06/16 0430 10/07/16 0419 10/07/16 1704 10/08/16 0553 10/09/16 0838  WBC 4.7 3.5* 3.1* 6.7 8.1  --   NEUTROABS  --   --  2.3  --   --   --   HGB 8.4* 8.3* 9.4* 9.3* 8.7* 9.1*  HCT 25.4* 25.1* 27.8* 27.9* 25.9*  --   MCV 90.3 92.3 91.7 90.7 90.8  --   PLT 130* 130* 145* 160 149*  --    Cardiac Enzymes:  Recent Labs Lab 10/05/16 0547  CKTOTAL 74  Recent Results (from the past 240 hour(s))  Urine culture     Status: Abnormal   Collection Time: 10/05/16 12:19 PM  Result Value Ref Range Status   Specimen Description URINE, CATHETERIZED  Final   Special Requests NONE  Final   Culture (A)  Final    <10,000 COLONIES/mL INSIGNIFICANT GROWTH Performed at [facility]    Report Status 10/06/2016 FINAL  Final     Scheduled Meds: . cefUROXime  500 mg Oral Daily  . docusate sodium  100 mg Oral BID  . feeding supplement (ENSURE ENLIVE)  237 mL Oral BID BM  . hydroxychloroquine  200 mg Oral Daily  . pantoprazole (PROTONIX) IV  40 mg Intravenous Q12H  . predniSONE  60 mg Oral Q breakfast  . sucralfate  1 g Oral TID WC & HS      Assessment/Plan:  1. 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 Patient  has an ANA and double-stranded DNA that's positive. Still waiting to hear back from kidney biopsy results (d/w nephro). Patient finished 3 days of high-dose IV steroids. Prednisone 60 mg daily. Creatinine has not improved much.  2. Lupus. Patient started on Plaquenil by rheumatology. We'll need to consider immunosuppression as outpatient.  3. 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 Patient will need outpatient follow-up with urology. Patient on empiric Ceftin. 4. GERD on Protonix 5. Nausea vomiting improved 6. 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 Hyperkalemia secondary to acute kidney injury. No treatment at this point increase oral hydration. 8. Constipation and abdominal pain. Patient had a bowel movement y'day afternoon. Continue to monitor.  Code Status:     Code Status Orders        Start     Ordered   10/04/16 2151  Full code  Continuous     10/04/16 2150    Code Status History    Date Active Date Inactive Code Status Order ID Comments User Context   08/30/2016  7:13 PM 09/04/2016  2:43 PM Full Code [id number]  Lytle Butte, MD ED     Disposition Plan: Creatinine will need to improve prior to disposition  Consultants:  Nephrology  Urology  Rheumatology  Procedures:  Kidney biopsy - waiting results  Antibiotics:  Keflex  Time spent: 24 minutes. Case discussed with family at the bedside.  Sriram Febles [employer]

## 2016-10-10 NOTE — Progress Notes (Signed)
Subjective:  Overall renal function remains quite diminished. We are still awaiting biopsy results from [facility] nephro pathology. Patient appears to be in good spirits however.   Objective:  Vital signs in last 24 hours:  Temp:  [98 F (36.7 C)-98.4 F (36.9 C)] 98.4 F (36.9 C) (01/08 1156) Pulse Rate:  [66-93] 93 (01/08 1156) Resp:  [18-20] 20 (01/08 1156) BP: (113-126)/(75-86) 113/75 (01/08 1156) SpO2:  [97 %-98 %] 97 % (01/08 1156)  Weight change:  Filed Weights   10/04/16 1727  Weight: 45.4 kg (100 lb)    Intake/Output:    Intake/Output Summary (Last 24 hours) at 10/10/16 1540 Last data filed at 10/10/16 1405  Gross per 24 hour  Intake              360 ml  Output             1900 ml  Net            -1540 ml     Physical Exam: General: No acute distress, laying in the bed,   HEENT alopecia, moist oral mucous membranes   Neck Supple   Pulm/lungs Normal breathing effort, clear to auscultation   CVS/Heart Regular rate and rhythm, no rub or gallop   Abdomen:  Soft, nontender , bowel sounds present  Extremities: trace peripheral edema   Neurologic: Alert, oriented   Skin: Facial rash and hyperpigmentation noted           Basic Metabolic Panel:   Recent Labs Lab 10/04/16 1725 10/05/16 0547 10/06/16 0430 10/08/16 0553 10/09/16 0838  NA 141 139 138 135 135  K 5.0 4.8 4.6 5.3* 4.9  CL 112* 116* 118* 115* 114*  CO2 22 20* 18* 16* 17*  GLUCOSE 97 89 97 124* 100*  BUN 31* 26* 24* 44* 56*  CREATININE 4.07* 3.60* 3.82* 4.27* 4.21*  CALCIUM 8.3* 7.4* 7.2* 7.4* 7.5*     CBC:  Recent Labs Lab 10/05/16 0547 10/06/16 0430 10/07/16 0419 10/07/16 1704 10/08/16 0553 10/09/16 0838  WBC 4.7 3.5* 3.1* 6.7 8.1  --   NEUTROABS  --   --  2.3  --   --   --   HGB 8.4* 8.3* 9.4* 9.3* 8.7* 9.1*  HCT 25.4* 25.1* 27.8* 27.9* 25.9*  --   MCV 90.3 92.3 91.7 90.7 90.8  --   PLT 130* 130* 145* 160 149*  --       Microbiology:  Recent Results (from the past 720  hour(s))  Urine culture     Status: Abnormal   Collection Time: 10/05/16 12:19 PM  Result Value Ref Range Status   Specimen Description URINE, CATHETERIZED  Final   Special Requests NONE  Final   Culture (A)  Final    <10,000 COLONIES/mL INSIGNIFICANT GROWTH Performed at [facility]    Report Status 10/06/2016 FINAL  Final    Coagulation Studies: No results for input(s): LABPROT, INR in the last 72 hours.  Urinalysis: No results for input(s): COLORURINE, LABSPEC, PHURINE, GLUCOSEU, HGBUR, BILIRUBINUR, KETONESUR, PROTEINUR, UROBILINOGEN, NITRITE, LEUKOCYTESUR in the last 72 hours.  Invalid input(s): APPERANCEUR    Imaging: No results found.   Medications:    . cefUROXime  500 mg Oral Daily  . docusate sodium  100 mg Oral BID  . feeding supplement (ENSURE ENLIVE)  237 mL Oral BID BM  . hydroxychloroquine  200 mg Oral Daily  . pantoprazole (PROTONIX) IV  40 mg Intravenous Q12H  . predniSONE  60 mg  Oral Q breakfast  . sucralfate  1 g Oral TID WC & HS   [DISCONTINUED] acetaminophen **OR** acetaminophen, acetaminophen, morphine injection, ondansetron **OR** ondansetron (ZOFRAN) IV, oxyCODONE-acetaminophen  Assessment/ Plan:  36 y.o. female with recent acute renal failure, left staghorn calculus, urinary tract infection, presents for nausea vomiting and decreased oral intake for 3 days  1. Recurrent acute renal failure 2. 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 Recent UTI- urine culture neg  5. Hematuria  Previous workup from November 2017 shows positive ENA, positive double-stranded DNA, chromatin antibodies, low C3 and C4, positive ribonucleoprotein antibodies Myeloperoxidase , anti-GBM antibodies are negative. 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 Given iv Solumedrol 750 mg daily iv x 3 (1/4-1/6), started on prednisone 60 mg daily 1/7 Kidney biopsy done on 10/08/15. About 3 cm post procedure hematoma  Plan:  We are still awaiting renal biopsy result.  Hopefully [facility] nephro pathology will call us later this afternoon with a preliminary result.  Otherwise we will  Maintain the patient on prednisone 60 mg by mouth daily as well as Vicryl 200 mg by mouth daily.  She may need more potent immunosuppression based upon renal biopsy result.        LOS: 6 Vernetta Dizdarevic 1/8/20183:40 PM

## 2016-10-10 NOTE — Progress Notes (Signed)
Nutrition Follow-up  DOCUMENTATION CODES:   Severe malnutrition in context of acute illness/injury, Underweight  INTERVENTION:  Provide Ensure Enlive po BID, each supplement provides 350 kcal and 20 grams of protein.  Reinforced importance of small, frequent meals and encouraged patient to eat more than 1 meal per day.  Recommend liberalizing diet from Carbohydrate Modified to Regular if no medical necessity to restrict carbohydrates. Patient is severely malnourished, underweight, with poor PO intake.  NUTRITION DIAGNOSIS:   Malnutrition (Severe) related to acute illness as evidenced by percent weight loss, energy intake < or equal to 50% for > or equal to 5 days.  Ongoing.  GOAL:   Patient will meet greater than or equal to 90% of their needs  Not met.   MONITOR:   PO intake, Supplement acceptance, Diet advancement, Labs, Weight trends, I & O's  REASON FOR ASSESSMENT:   Consult Assessment of nutrition requirement/status  ASSESSMENT:   36 y.o. female with a known history of Acute kidney injury, staghorn calculus, recent history of urinary tract infection who presents to the hospital due to intractable nausea and vomiting for the past 2-3 days. Found to have acute kidney injury, bilateral nephrolithiasis. 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 Plan for renal biopsy.  -Patient now with diagnosis of Lupus Nephritis in setting of positive DNA, still pending kidney biopsy results. S/P 3 days of high-dose IV steroids.  -Diet advanced to Carbohydrate Modified with No Added Salt on 1/6.   Spoke with patient at bedside. She reports her appetite is slowly improving. She is having some nausea still. Denies abdominal pain. Patient amenable to trying to eat more meals and to drinking Ensure between meals to meet needs.   Meal Completion: 100% of breakfast this morning (400 kcal and 11 grams of protein). Upon review of [employer] patient has only been eating 1 meal  per day.  Medications reviewed and include: Colace, pantoprazole, prednisone 60 mg daily.   Labs reviewed: Chloride 114, CO2 17, Glucose 100, BUN 56, Creatinine 4.21.   Discussed with RN.   Diet Order:  Diet Carb Modified Fluid consistency: Thin; Room service appropriate? Yes  Skin:  Reviewed, no issues  Last BM:  10/09/2016  Height:   Ht Readings from Last 1 Encounters:  10/04/16 '5\' 2"'  (1.575 m)    Weight:   Wt Readings from Last 1 Encounters:  10/04/16 100 lb (45.4 kg)    Ideal Body Weight:  50 kg  BMI:  Body mass index is 18.29 kg/m.  Estimated Nutritional Needs:   Kcal:  1440-1680 (HBE x 1.2-1.4)  Protein:  55-65 grams (1.2-1.4 grams/kg)  Fluid:  >/= 1.5 L/day (35 ml/kg) or per MD  EDUCATION NEEDS:   No education needs identified at this time  Willey Blade, MS, RD, LDN Pager: [phone] After Hours Pager: [phone]

## 2016-10-11 LAB — BASIC METABOLIC PANEL
Anion gap: 5 (ref 5–15)
BUN: 63 mg/dL — AB (ref 6–20)
CALCIUM: 7.8 mg/dL — AB (ref 8.9–10.3)
CO2: 17 mmol/L — ABNORMAL LOW (ref 22–32)
CREATININE: 3.54 mg/dL — AB (ref 0.44–1.00)
Chloride: 113 mmol/L — ABNORMAL HIGH (ref 101–111)
GFR calc Af Amer: 18 mL/min — ABNORMAL LOW (ref >60)
GFR, EST NON AFRICAN AMERICAN: 16 mL/min — AB (ref >60)
Glucose, Bld: 88 mg/dL (ref 65–99)
Potassium: 4.7 mmol/L (ref 3.5–5.1)
SODIUM: 135 mmol/L (ref 135–145)

## 2016-10-11 LAB — CBC
HCT: 25.8 % — ABNORMAL LOW (ref 35.0–47.0)
Hemoglobin: 8.6 g/dL — ABNORMAL LOW (ref 12.0–16.0)
MCH: 30.1 pg (ref 26.0–34.0)
MCHC: 33.4 g/dL (ref 32.0–36.0)
MCV: 90.1 fL (ref 80.0–100.0)
PLATELETS: 167 10*3/uL (ref 150–440)
RBC: 2.86 MIL/uL — AB (ref 3.80–5.20)
RDW: 13.8 % (ref 11.5–14.5)
WBC: 9.6 10*3/uL (ref 3.6–11.0)

## 2016-10-11 MED ORDER — CYCLOPHOSPHAMIDE CHEMO INJECTION 1 GM
500.0000 mg/m2 | Freq: Once | INTRAMUSCULAR | Status: AC
  Administered 2016-10-11: 18:00:00 700 mg via INTRAVENOUS
  Filled 2016-10-11: qty 35

## 2016-10-11 MED ORDER — SODIUM CHLORIDE 0.9 % IV SOLN
INTRAVENOUS | Status: DC
  Administered 2016-10-11: 14:00:00 via INTRAVENOUS

## 2016-10-11 NOTE — Plan of Care (Signed)
Problem: Skin Integrity: Goal: Risk for impaired skin integrity will decrease Outcome: Not Progressing Rash remains  Face   Problem: Bowel/Gastric: Goal: Bowel function will improve Outcome: Progressing Received chemo this pm tol well. Cytoxan.  New iv site  Left forearm.  Good blood return noted dual sign off.

## 2016-10-11 NOTE — Progress Notes (Signed)
Patient ID: Patricia Maldonado, female   DOB: 1981-02-05, 36 y.o.   MRN: [id number]  [facility] PROGRESS NOTE  Patricia Maldonado DOB: 1981-02-28 DOA: 10/04/2016 PCP: No PCP Per Patient  HPI/Subjective: Feeling better, started Cytoxan today  Objective: Vitals:   10/11/16 0900 10/11/16 1507  BP: 121/77 121/83  Pulse: 81 87  Resp: 20 18  Temp: 98.1 F (36.7 C) 98.1 F (36.7 C)    Filed Weights   10/04/16 1727 10/11/16 1507  Weight: 45.4 kg (100 lb) 54.2 kg (119 lb 6.4 oz)    ROS: Review of Systems  Constitutional: Negative for chills and fever.  Eyes: Negative for blurred vision.  Respiratory: Negative for cough and shortness of breath.   Cardiovascular: Negative for chest pain.  Gastrointestinal: Negative for diarrhea, nausea and vomiting.  Genitourinary: Negative for dysuria.  Musculoskeletal: Negative for joint pain.  Skin: Positive for rash.  Neurological: Negative for dizziness and headaches.   Exam: Physical Exam  Constitutional: She is oriented to person, place, and time.  HENT:  Nose: No mucosal edema.  Mouth/Throat: No oropharyngeal exudate or posterior oropharyngeal edema.  Eyes: Conjunctivae, EOM and lids are normal. Pupils are equal, round, and reactive to light.  Neck: No JVD present. Carotid bruit is not present. No edema present. No thyroid mass and no thyromegaly present.  Cardiovascular: S1 normal and S2 normal.  Exam reveals no gallop.   No murmur heard. Pulses:      Dorsalis pedis pulses are 2+ on the right side, and 2+ on the left side.  Respiratory: No respiratory distress. She has no wheezes. She has no rhonchi. She has no rales.  GI: Soft. Bowel sounds are normal. There is no tenderness.  Musculoskeletal:       Right ankle: She exhibits no swelling.       Left ankle: She exhibits no swelling.  Lymphadenopathy:    She has no cervical adenopathy.  Neurological: She is alert and oriented to person, place, and time. No cranial nerve  deficit.  Skin: Skin is warm. Nails show no clubbing.  Rash on the right side of her face and left side of her lip  Psychiatric: She has a normal mood and affect.      Data Reviewed: Basic Metabolic Panel:  Recent Labs Lab 10/05/16 0547 10/06/16 0430 10/08/16 0553 10/09/16 0838 10/11/16 0442  NA 139 138 135 135 135  K 4.8 4.6 5.3* 4.9 4.7  CL 116* 118* 115* 114* 113*  CO2 20* 18* 16* 17* 17*  GLUCOSE 89 97 124* 100* 88  BUN 26* 24* 44* 56* 63*  CREATININE 3.60* 3.82* 4.27* 4.21* 3.54*  CALCIUM 7.4* 7.2* 7.4* 7.5* 7.8*   Liver Function Tests:  Recent Labs Lab 10/04/16 1725  AST 20  ALT 10*  ALKPHOS 69  BILITOT 0.4  PROT 5.7*  ALBUMIN 1.7*    Recent Labs Lab 10/04/16 1725  LIPASE 17   CBC:  Recent Labs Lab 10/06/16 0430 10/07/16 0419 10/07/16 1704 10/08/16 0553 10/09/16 0838 10/11/16 0442  WBC 3.5* 3.1* 6.7 8.1  --  9.6  NEUTROABS  --  2.3  --   --   --   --   HGB 8.3* 9.4* 9.3* 8.7* 9.1* 8.6*  HCT 25.1* 27.8* 27.9* 25.9*  --  25.8*  MCV 92.3 91.7 90.7 90.8  --  90.1  PLT 130* 145* 160 149*  --  167   Cardiac Enzymes:  Recent Labs Lab 10/05/16 0547  CKTOTAL 74  Recent Results (from the past 240 hour(s))  Urine culture     Status: Abnormal   Collection Time: 10/05/16 12:19 PM  Result Value Ref Range Status   Specimen Description URINE, CATHETERIZED  Final   Special Requests NONE  Final   Culture (A)  Final    <10,000 COLONIES/mL INSIGNIFICANT GROWTH Performed at [facility]    Report Status 10/06/2016 FINAL  Final     Scheduled Meds: . cefUROXime  500 mg Oral Daily  . cyclophosphamide  500 mg/m2 Intravenous Once  . docusate sodium  100 mg Oral BID  . feeding supplement (ENSURE ENLIVE)  237 mL Oral BID BM  . hydroxychloroquine  200 mg Oral Daily  . pantoprazole (PROTONIX) IV  40 mg Intravenous Q12H  . predniSONE  60 mg Oral Q breakfast  . sucralfate  1 g Oral TID WC & HS    . sodium chloride       Assessment/Plan:  1. 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 Patient has an ANA and double-stranded DNA that's positive. Patient finished 3 days of high-dose IV steroids. Prednisone 60 mg daily. 2. Lupus. Patient started on Plaquenil by rheumatology.  Also started on Cytoxan today and will need once a month infusion at [place] once discharged tomorrow.  Discussed with Dr. Grayland Ormond 3. Static kidney calculus with no evidence of obstruction. Patient will need outpatient follow-up with urology. Patient on empiric Ceftin. 4. GERD on Protonix 5. Nausea vomiting improved 6. 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 Hyperkalemia secondary to acute kidney injury. No treatment at this point increase oral hydration. 8. Constipation and abdominal pain: resolved  Code Status:     Code Status Orders        Start     Ordered   10/04/16 2151  Full code  Continuous     10/04/16 2150    Code Status History    Date Active Date Inactive Code Status Order ID Comments User Context   08/30/2016  7:13 PM 09/04/2016  2:43 PM Full Code [id number]  Lytle Butte, MD ED     Disposition Plan: Creatinine will need to improve prior to disposition  Consultants:  Nephrology  Urology  Rheumatology  Procedures:  Kidney biopsy - Showing lupus nephritis  Antibiotics:  Keflex  Time spent: 24 minutes. Case discussed with family at the bedside.  Patricia Maldonado [employer]

## 2016-10-11 NOTE — Progress Notes (Signed)
Patient taken to Oncology unit as ordered.

## 2016-10-11 NOTE — Progress Notes (Signed)
Patient transferred to Oncology floor. Report given to Rowe Robert RN. Patient is alert and oriented, no acute distress noted. Patient denies paint at this time.

## 2016-10-11 NOTE — Progress Notes (Signed)
Subjective:  Patient confirmed to have lupus nephritis. [facility] nephro pathology. We spent considerable time today talking about therapy. After discussion we decided upon cyclophosphamide as treatment. We discussed risks, benefits, and alternatives to cyclophosphamide. Of note patient has history of bilateral tubal ligation and isn't planning to have any further children.   Objective:  Vital signs in last 24 hours:  Temp:  [97.6 F (36.4 C)-98.4 F (36.9 C)] 98.1 F (36.7 C) (01/09 0900) Pulse Rate:  [80-95] 81 (01/09 0900) Resp:  [16-20] 20 (01/09 0900) BP: (113-122)/(75-86) 121/77 (01/09 0900) SpO2:  [97 %-100 %] 98 % (01/09 0900)  Weight change:  Filed Weights   10/04/16 1727  Weight: 45.4 kg (100 lb)    Intake/Output:    Intake/Output Summary (Last 24 hours) at 10/11/16 1054 Last data filed at 10/11/16 0539  Gross per 24 hour  Intake              300 ml  Output              900 ml  Net             -600 ml     Physical Exam: General: No acute distress, laying in the bed,   HEENT alopecia, moist oral mucous membranes   Neck Supple   Pulm/lungs Normal breathing effort, clear to auscultation   CVS/Heart Regular rate and rhythm, no rub or gallop   Abdomen:  Soft, nontender , bowel sounds present  Extremities: trace peripheral edema   Neurologic: Alert, oriented   Skin: Facial rash and hyperpigmentation noted           Basic Metabolic Panel:   Recent Labs Lab 10/05/16 0547 10/06/16 0430 10/08/16 0553 10/09/16 0838 10/11/16 0442  NA 139 138 135 135 135  K 4.8 4.6 5.3* 4.9 4.7  CL 116* 118* 115* 114* 113*  CO2 20* 18* 16* 17* 17*  GLUCOSE 89 97 124* 100* 88  BUN 26* 24* 44* 56* 63*  CREATININE 3.60* 3.82* 4.27* 4.21* 3.54*  CALCIUM 7.4* 7.2* 7.4* 7.5* 7.8*     CBC:  Recent Labs Lab 10/06/16 0430 10/07/16 0419 10/07/16 1704 10/08/16 0553 10/09/16 0838 10/11/16 0442  WBC 3.5* 3.1* 6.7 8.1  --  9.6  NEUTROABS  --  2.3  --   --   --   --   HGB  8.3* 9.4* 9.3* 8.7* 9.1* 8.6*  HCT 25.1* 27.8* 27.9* 25.9*  --  25.8*  MCV 92.3 91.7 90.7 90.8  --  90.1  PLT 130* 145* 160 149*  --  167      Microbiology:  Recent Results (from the past 720 hour(s))  Urine culture     Status: Abnormal   Collection Time: 10/05/16 12:19 PM  Result Value Ref Range Status   Specimen Description URINE, CATHETERIZED  Final   Special Requests NONE  Final   Culture (A)  Final    <10,000 COLONIES/mL INSIGNIFICANT GROWTH Performed at [facility]    Report Status 10/06/2016 FINAL  Final    Coagulation Studies: No results for input(s): LABPROT, INR in the last 72 hours.  Urinalysis: No results for input(s): COLORURINE, LABSPEC, PHURINE, GLUCOSEU, HGBUR, BILIRUBINUR, KETONESUR, PROTEINUR, UROBILINOGEN, NITRITE, LEUKOCYTESUR in the last 72 hours.  Invalid input(s): APPERANCEUR    Imaging: No results found.   Medications:   . sodium chloride     . cefUROXime  500 mg Oral Daily  . cyclophosphamide  500 mg/m2 Intravenous Once  . docusate  sodium  100 mg Oral BID  . feeding supplement (ENSURE ENLIVE)  237 mL Oral BID BM  . hydroxychloroquine  200 mg Oral Daily  . pantoprazole (PROTONIX) IV  40 mg Intravenous Q12H  . predniSONE  60 mg Oral Q breakfast  . sucralfate  1 g Oral TID WC & HS   [DISCONTINUED] acetaminophen **OR** acetaminophen, acetaminophen, morphine injection, ondansetron **OR** ondansetron (ZOFRAN) IV, oxyCODONE-acetaminophen  Assessment/ Plan:  36 y.o. female with recent acute renal failure, left staghorn calculus, urinary tract infection, presents for nausea vomiting and decreased oral intake for 3 days  1. Recurrent acute renal failure 2. 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 Recent UTI- urine culture neg  5. SLE with glomerular involvement (lupus nephritis)   Previous workup from November 2017 shows positive ENA,  positive double-stranded DNA, chromatin antibodies, low C3 and C4, positive ribonucleoprotein antibodies Myeloperoxidase , anti-GBM antibodies are negative. ASO titer was elevated Urine protein to creatinine ratio was 5.6 g; 11 gm this admission Urinalysis shows too numerous to count RBCs and WBCs Urine culture is negative to date  Immunology profile suggests that patient has active lupus nephritis. Given iv Solumedrol 750 mg daily iv x 3 (1/4-1/6), started on prednisone 60 mg daily 1/7 Kidney biopsy done on 10/08/15. About 3 cm post procedure hematoma Lupus nephritis confirmed on renal biopsy, awaiting final result.   Plan:  [facility] nephro pathology did call us with the preliminary result yesterday. The patient does appear to have lupus nephritis. There is significant activity on the biopsy specimen. Unfortunately the patient does not have health care coverage therefore it would be difficult to obtain mycophenolate for the patient. Therefore we discussed treatment with cyclophosphamide at the cancer center. We discussed risks, benefits, and alternatives to cyclophosphamide. Of note the patient has history of bilateral tubal ligation and is not planning on having any further children. Therefore we will proceed with cyclophosphamide at this time. We will give cyclophosphamide 500 mg/m as she has significant renal insufficiency. We may consider increasing the dosage if her renal function improves. In addition we will maintain the patient on prednisone 60 mg by mouth daily.  I would also like to start her on an ARB but we will hold off for now.        LOS: 7 Ovide Dusek 1/9/201810:54 AM

## 2016-10-12 ENCOUNTER — Encounter: Payer: Self-pay | Admitting: Urology

## 2016-10-12 ENCOUNTER — Ambulatory Visit: Payer: Self-pay | Admitting: Urology

## 2016-10-12 ENCOUNTER — Ambulatory Visit (INDEPENDENT_AMBULATORY_CARE_PROVIDER_SITE_OTHER): Payer: Self-pay | Admitting: Urology

## 2016-10-12 VITALS — BP 119/84 | HR 108 | Ht 62.0 in | Wt 112.0 lb

## 2016-10-12 DIAGNOSIS — N390 Urinary tract infection, site not specified: Secondary | ICD-10-CM

## 2016-10-12 DIAGNOSIS — M3214 Glomerular disease in systemic lupus erythematosus: Secondary | ICD-10-CM

## 2016-10-12 DIAGNOSIS — N200 Calculus of kidney: Secondary | ICD-10-CM

## 2016-10-12 MED ORDER — CEFUROXIME AXETIL 500 MG PO TABS: 500.0000 mg | ORAL_TABLET | Freq: Every day | ORAL | 0 refills | Status: DC

## 2016-10-12 MED ORDER — HYDROXYCHLOROQUINE SULFATE 200 MG PO TABS: 200.0000 mg | ORAL_TABLET | Freq: Every day | ORAL | 0 refills | Status: DC

## 2016-10-12 MED ORDER — PREDNISONE 20 MG PO TABS: 60.0000 mg | ORAL_TABLET | Freq: Every day | ORAL | 0 refills | Status: DC

## 2016-10-12 MED ORDER — PANTOPRAZOLE SODIUM 40 MG PO TBEC: 40.0000 mg | DELAYED_RELEASE_TABLET | Freq: Two times a day (BID) | ORAL | Status: DC

## 2016-10-12 MED ORDER — ACETAMINOPHEN 500 MG PO TABS: 500.0000 mg | ORAL_TABLET | Freq: Four times a day (QID) | ORAL | 0 refills | Status: DC | PRN

## 2016-10-12 NOTE — Plan of Care (Signed)
Problem: Education: Goal: Knowledge of  General Education information/materials will improve Outcome: Progressing VSS, free of falls during shift.  Denies nausea.  No complaints overnight.  Ambulated to bathroom multiple times during shift.  Bed in low position, call bell within reach.  WCTM.

## 2016-10-12 NOTE — Progress Notes (Signed)
CONCERNING: IV to Oral Route Change Policy  RECOMMENDATION: This patient is receiving pantoprazole by the intravenous route.  Based on criteria approved by the Pharmacy and Therapeutics Committee, the intravenous medication(s) is/are being converted to the equivalent oral dose form(s).   DESCRIPTION: These criteria include:  The patient is eating (either orally or via tube) and/or has been taking other orally administered medications for a least 24 hours  The patient has no evidence of active gastrointestinal bleeding or impaired GI absorption (gastrectomy, short bowel, patient on TNA or NPO).  If you have questions about this conversion, please contact the Pharmacy Department  []   [phone] )  [facility] [x]   [phone] )  [facility] []   [phone] )  [facility] []   [phone] )  [facility] []   [phone] )  [facility] 10/12/2016 8:41 AM

## 2016-10-12 NOTE — Discharge Instructions (Signed)
Systemic Lupus Erythematosus, Adult Systemic lupus erythematosus is a long-term (chronic) disease that can affect many parts of the body. It can damage the skin, joints, blood vessels, brain, kidneys, lungs, heart, and other internal organs. It causes pain, irritation, and inflammation. Systemic lupus erythematosus is an autoimmune disease. With this type of disease, the bodys defense system (immune system) mistakenly attacks normal tissues instead of attacking germs or abnormal growths. What are the causes? The cause of this condition is not known. What increases the risk? This condition is more likely to develop in:  Females.  People of Asian descent.  People of African-American descent.  People who have a family history of the condition. What are the signs or symptoms? General symptoms include:  Joint pain and swelling (common).  Fever.  Fatigue.  Unusual weight loss or weight gain.  Skin rashes, especially over the nose and cheeks (butterfly rash) and after sun exposure.  Sores inside the mouth or nose. Other symptoms depend on which parts of the body are affected. They can include:  Shortness of breath.  Chest pain.  Frequent urination.  Blood in the urine.  Seizures.  Mental changes.  Hair loss.  Swollen and tender lymph nodes.  Swelling of the hands or feet. Symptoms can come and go. A period of time when symptoms get worse or come back is called a flare. A period of time with no symptoms is called a remission. How is this diagnosed? This condition is diagnosed based on symptoms, a medical history, and a physical exam. You may also have tests, including:  Blood tests.  Urine tests.  A chest X-ray.  A skin or kidney biopsy. For this test, a sample of tissue is taken from the skin or kidney and studied under a microscope. You may be referred to an autoimmune disease specialist (rheumatologist). How is this treated? There is no cure for this  condition, but treatment can keep the disease in remission, help to control symptoms, and prevent damage to the heart, lungs, kidneys, and other organs. Treatment may involve taking a combination of medicines over time. Follow these instructions at home: Medicines  Take medicines only as directed by your health care provider.  Do not take any medicines that contain estrogen without first checking with your health care provider. Estrogen can trigger flares and may increase your risk for blood clots. Lifestyle  Eat a heart-healthy diet.  Stay active as directed by your health care provider.  Do not smoke. If you need help quitting, ask your health care provider.  Protect your skin from the sun by applying sunblock and wearing protective hats and clothing.  Learn as much as you can about your condition and have a good support system in place. Support may come from family, friends, or a lupus support group. General instructions  Keep all follow-up visits as directed by your health care provider. This is important.  Work closely with all of your health care providers to manage your condition.  Let your health care provider know right away if you become pregnant or if you plan to become pregnant. Pregnancy in women with this condition is considered high risk. Contact a health care provider if:  You have a fever.  Your symptoms flare.  You develop new symptoms.  You develop swollen feet or hands.  You develop puffiness around your eyes.  Your medicines are not working.  You have bloody, foamy, or coffee-colored urine.  There are changes in your urination. For example, you  urinate more often at night.  You think that you may be depressed or have anxiety. Get help right away if:  You have chest pain.  You have trouble breathing.  You have a seizure.  You suddenly get a very bad headache.  You suddenly develop facial or body weakness.  You cannot speak.  You cannot  understand speech. This information is not intended to replace advice given to you by your health care provider. Make sure you discuss any questions you have with your health care provider. Document Released: 09/09/2002 Document Revised: 05/15/2016 Document Reviewed: 08/27/2014 Elsevier Interactive Patient Education  2017 [employer].

## 2016-10-12 NOTE — Progress Notes (Signed)
Med mgmt clinic can't provider ceftin. Will change to Cefdinir 300 mg once daily for 7 days (to adjust kidney function)

## 2016-10-12 NOTE — Care Management (Signed)
Discharge to home today per Dr. Manuella Ghazi. Spoke with Patricia Maldonado in the room. A family member will transport today. Discussed that she would need to go over to Medication Management for her discharge medications. (prescriptions faxed over). Will need to follow-up at [facility] (will update clinic).  Patricia Maldonado has an appointment with Dr. Erlene Quan today at 3:00pm. Will have stone blasting procedure at 4:00pm. Shelbie Ammons RN MSN [facility] Care Management

## 2016-10-12 NOTE — Progress Notes (Signed)
Pt for discharge home today. Alert. No resp distress.  Nausea better.  Discharge instructions discussed with pt. presc  Given and discussed . Pt to go to med managemt  To pick up meds.  Diet activity and f/u discussed. Stressed importance in keeping f/u appts.  Pt in agreement.  ivf and site d/cd.  Verbalizes  Understanding of discharge.  Home  Via w/c at this time  With  Family. No c/o.

## 2016-10-14 NOTE — Discharge Summary (Signed)
[facility] NAME: Patricia Maldonado    MR#:  [id number]  DATE OF BIRTH:  1981-03-26  DATE OF ADMISSION:  10/04/2016   ADMITTING PHYSICIAN: Henreitta Leber, MD  DATE OF DISCHARGE: 10/12/2016 11:34 AM  PRIMARY CARE PHYSICIAN: No PCP Per Patient   ADMISSION DIAGNOSIS:  Dehydration [E86.0] Acute renal failure, unspecified acute renal failure type ([place]) [N17.9] Intractable vomiting without nausea, unspecified vomiting type [R11.11] DISCHARGE DIAGNOSIS:  Active Problems:   Acute renal failure (ARF) ([place])  SECONDARY DIAGNOSIS:   Past Medical History:  Diagnosis Date  . Acute renal failure (ARF) ([place])   . Rash   . Staghorn calculus    HOSPITAL COURSE:  36 y.o. female with a known history of Acute kidney injury, staghorn calculus, recent history of urinary tract infection admitted due to intractable nausea and vomiting.  1. 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 Patient has an ANA and double-stranded DNA that's positive. Patient finished 3 days of high-dose IV steroids. discharging on oral steroids. 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 Patient started on Plaquenil by rheumatology.  Also received one dose of Cytoxan while in the Hospital and will need once a month infusion at [facility] as an outpt. Discussed with Dr. Grayland Ormond 3. Stable kidney calculus with no evidence of obstruction. Patient will need outpatient follow-up with urology. Patient on empiric Abx 4. GERD on Protonix 5. Nausea vomiting improved 6. 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 Hyperkalemia secondary to acute kidney injury. No treatment at this point increase oral hydration. 8. Constipation and abdominal pain: resolved  DISCHARGE CONDITIONS:  stable CONSULTS OBTAINED:  Treatment Team:  [name], MD Murlean Iba, MD [name]., MD DRUG ALLERGIES:   Allergies  Allergen Reactions  . Penicillins     Yeast infection   DISCHARGE MEDICATIONS:   Allergies as of 10/12/2016      Reactions   Penicillins    Yeast infection      Medication List    TAKE these medications   acetaminophen 500 MG tablet Commonly known as:  TYLENOL Take 1-2 tablets (500-1,000 mg total) by mouth every 6 (six) hours as needed for mild pain.   cefUROXime 500 MG tablet Commonly known as:  CEFTIN Take 1 tablet (500 mg total) by mouth daily.   hydroxychloroquine 200 MG tablet Commonly known as:  PLAQUENIL Take 1 tablet (200 mg total) by mouth daily.   metroNIDAZOLE 0.75 % cream Commonly known as:  METROCREAM Apply topically 2 (two) times daily.   predniSONE 20 MG tablet Commonly known as:  DELTASONE Take 3 tablets (60 mg total) by mouth daily with breakfast.   triamcinolone cream 0.1 % Commonly known as:  KENALOG Apply topically 2 (two) times daily.        DISCHARGE INSTRUCTIONS:   DIET:  Regular diet DISCHARGE CONDITION:  Good ACTIVITY:  Activity as tolerated OXYGEN:  Home Oxygen: No.  Oxygen Delivery: room air DISCHARGE LOCATION:  home   If you experience worsening of your admission symptoms, develop shortness of breath, life threatening emergency, suicidal or homicidal thoughts you must seek medical attention immediately by calling 911 or calling your MD immediately  if symptoms less severe.  You Must read complete instructions/literature along with all the possible adverse reactions/side effects for all the Medicines you take and that have been prescribed to you. Take any new Medicines after you have completely understood and accpet all the possible  adverse reactions/side effects.   Please note  You were cared for by a hospitalist during your hospital stay. If you have any questions about your discharge medications or the care you received while you were in the hospital after you are discharged, you can call the unit and asked to speak with the hospitalist on call if the hospitalist  that took care of you is not available. Once you are discharged, your primary care physician will handle any further medical issues. Please note that NO REFILLS for any discharge medications will be authorized once you are discharged, as it is imperative that you return to your primary care physician (or establish a relationship with a primary care physician if you do not have one) for your aftercare needs so that they can reassess your need for medications and monitor your lab values.    On the day of Discharge:  VITAL SIGNS:  Blood pressure (!) 138/93, pulse 81, temperature 98 F (36.7 C), temperature source Oral, resp. rate 20, height 5\' 2"  (1.575 m), weight 54.2 kg (119 lb 6.4 oz), last menstrual period 10/02/2016, SpO2 99 %. PHYSICAL EXAMINATION:  GENERAL:  36 y.o.-year-old patient lying in the bed with no acute distress.  EYES: Pupils equal, round, reactive to light and accommodation. No scleral icterus. Extraocular muscles intact.  HEENT: Head atraumatic, normocephalic. Oropharynx and nasopharynx clear.  NECK:  Supple, no jugular venous distention. No thyroid enlargement, no tenderness.  LUNGS: Normal breath sounds bilaterally, no wheezing, rales,rhonchi or crepitation. No use of accessory muscles of respiration.  CARDIOVASCULAR: S1, S2 normal. No murmurs, rubs, or gallops.  ABDOMEN: Soft, non-tender, non-distended. Bowel sounds present. No organomegaly or mass.  EXTREMITIES: No pedal edema, cyanosis, or clubbing.  NEUROLOGIC: Cranial nerves II through XII are intact. Muscle strength 5/5 in all extremities. Sensation intact. Gait not checked.  PSYCHIATRIC: The patient is alert and oriented x 3.  SKIN: No obvious rash, lesion, or ulcer.  DATA REVIEW:   CBC  Recent Labs Lab 10/11/16 0442  WBC 9.6  HGB 8.6*  HCT 25.8*  PLT 167    Chemistries   Recent Labs Lab 10/11/16 0442  NA 135  K 4.7  CL 113*  CO2 17*  GLUCOSE 88  BUN 63*  CREATININE 3.54*  CALCIUM 7.8*      Follow-up Information    [facility] Follow up on 11/28/2016.   Why:  @ 10:45am       [name], MD. Go on 10/12/2016.   Specialty:  Urology Why:  as scheduled Contact information: [address] [place] [address] [phone]           Management plans discussed with the patient, family and they are in agreement.  CODE STATUS:  Code Status History    Date Active Date Inactive Code Status Order ID Comments User Context   10/04/2016  9:50 PM 10/12/2016  3:04 PM Full Code [id number]  Henreitta Leber, MD Inpatient   08/30/2016  7:13 PM 09/04/2016  2:43 PM Full Code [id number]  Lytle Butte, MD ED      TOTAL TIME TAKING CARE OF THIS PATIENT: 45 minutes.    Max Sane M.D on 10/14/2016 at 2:30 PM  Between 7am to 6pm - Pager - [phone]  After 6pm go to [email or web address] - [employer] [facility]  Office  [phone]  CC: Primary care physician; No PCP Per Patient   Note: This dictation was prepared with  Dragon dictation along with smaller Company secretary. Any transcriptional errors that result from this process are unintentional.

## 2016-10-16 NOTE — Progress Notes (Signed)
10/12/2016 7:32 PM   Patricia Maldonado 1981/08/04 [id number]  Referring provider: No referring provider defined for this encounter.  Chief Complaint  Patient presents with  . Nephrolithiasis    discuss surgery    HPI: 36 year old female discharged from the hospital earlier today diagnosed with this nephritis found to have an incidental right partial staghorn calculus.  She initially presented to the hospital in November/2017 with was thought to be acute kidney injury and a right partial staghorn calculus. She was treated for a urinary tract infection however, her creatinine failed to improve. More recently, she was readmitted with worsening renal failure and previous workup suggestive of lupus nephritis with positive ANA and double-stranded DNA. She ultimately underwent renal biopsy confirming the diagnosis of lupus nephritis. She was treated with 3 days of high-dose IV steroids and transition to oral prednisone. She was also started on immunosuppressive Plaquenil.  In terms of her stone, she is asymptomatic. She did have a positive urinalysis 2 highly suspicious for infection, however, upon retro-inspection, may have been related to underlying nephritis rather than infectious source. She is on oral abx as a precaution although UCx negative x 2.    She denies any flank pain. She has no urinary symptoms. She was having colicky left lower quadrant pain which is possibly related to constipation which is resolved. No fevers or chills.  No prior history of kidney stones. No history of urinary tract infections.  PMH: Past Medical History:  Diagnosis Date  . Acute renal failure (ARF) ([place])   . Rash   . Staghorn calculus     Surgical History: Past Surgical History:  Procedure Laterality Date  . TUBAL LIGATION      Home Medications:  Allergies as of 10/12/2016      Reactions   Penicillins    Yeast infection      Medication List       Accurate as of 10/12/16 11:59 PM. Always use  your most recent med list.          acetaminophen 500 MG tablet Commonly known as:  TYLENOL Take 1-2 tablets (500-1,000 mg total) by mouth every 6 (six) hours as needed for mild pain.   cefUROXime 500 MG tablet Commonly known as:  CEFTIN Take 1 tablet (500 mg total) by mouth daily.   hydroxychloroquine 200 MG tablet Commonly known as:  PLAQUENIL Take 1 tablet (200 mg total) by mouth daily.   metroNIDAZOLE 0.75 % cream Commonly known as:  METROCREAM Apply topically 2 (two) times daily.   predniSONE 20 MG tablet Commonly known as:  DELTASONE Take 3 tablets (60 mg total) by mouth daily with breakfast.   triamcinolone cream 0.1 % Commonly known as:  KENALOG Apply topically 2 (two) times daily.       Allergies:  Allergies  Allergen Reactions  . Penicillins     Yeast infection    Family History: Family History  Problem Relation Age of Onset  . Thyroid disease Mother   . Thyroid disease Father     Social History:  reports that she has been smoking Cigarettes.  She has a 7.50 pack-year smoking history. She has never used smokeless tobacco. She reports that she uses drugs, including Marijuana. She reports that she does not drink alcohol.  ROS: UROLOGY Frequent Urination?: Yes Hard to postpone urination?: No Burning/pain with urination?: No Get up at night to urinate?: Yes Leakage of urine?: No Urine stream starts and stops?: No Trouble starting stream?: No Do you have  to strain to urinate?: No Blood in urine?: No Urinary tract infection?: No Sexually transmitted disease?: No Injury to kidneys or bladder?: No Painful intercourse?: No Weak stream?: No Currently pregnant?: No Vaginal bleeding?: No Last menstrual period?: 10/02/16  Gastrointestinal Nausea?: Yes Vomiting?: Yes Indigestion/heartburn?: No Diarrhea?: No Constipation?: No  Constitutional Fever: No Night sweats?: No Weight loss?: No Fatigue?: No  Skin Skin rash/lesions?: No Itching?:  No  Eyes Blurred vision?: No Double vision?: No  Ears/Nose/Throat Sore throat?: No Sinus problems?: No  Hematologic/Lymphatic Swollen glands?: No Easy bruising?: No  Cardiovascular Leg swelling?: Yes Chest pain?: No  Respiratory Cough?: No Shortness of breath?: No  Endocrine Excessive thirst?: No  Musculoskeletal Back pain?: Yes Joint pain?: Yes  Neurological Headaches?: Yes Dizziness?: Yes  Psychologic Depression?: No Anxiety?: No  Physical Exam: BP 119/84   Pulse (!) 108   Ht 5\' 2"  (1.575 m)   Wt 112 lb (50.8 kg)   LMP 10/02/2016 Comment: neg preg test  BMI 20.49 kg/m   Constitutional:  Alert and oriented, No acute distress. HEENT:  AT, moist mucus membranes.  Trachea midline, no masses.  Periorbital edema appreciated. Cardiovascular: No clubbing, cyanosis, or edema. Respiratory: Normal respiratory effort, no increased work of breathing. GI: Abdomen is soft, nontender, nondistended, no abdominal masses GU: No CVA tenderness.  Skin: Perioral facial rash appreciated. Lymph: No cervical or inguinal adenopathy. Neurologic: Grossly intact, no focal deficits, moving all 4 extremities. Psychiatric: Tearful at times, depressed.  Laboratory Data: Lab Results  Component Value Date   WBC 9.6 10/11/2016   HGB 8.6 (L) 10/11/2016   HCT 25.8 (L) 10/11/2016   MCV 90.1 10/11/2016   PLT 167 10/11/2016    Lab Results  Component Value Date   CREATININE 3.54 (H) 10/11/2016    Urinalysis    Component Value Date/Time   COLORURINE YELLOW (A) 10/05/2016 1219   APPEARANCEUR HAZY (A) 10/05/2016 1219   APPEARANCEUR Cloudy 09/16/2014 0814   LABSPEC 1.013 10/05/2016 1219   LABSPEC 1.016 09/16/2014 0814   PHURINE 7.0 10/05/2016 1219   GLUCOSEU NEGATIVE 10/05/2016 1219   GLUCOSEU Negative 09/16/2014 0814   HGBUR MODERATE (A) 10/05/2016 1219   BILIRUBINUR NEGATIVE 10/05/2016 1219   BILIRUBINUR Negative 09/16/2014 0814   KETONESUR NEGATIVE 10/05/2016 1219    PROTEINUR >=300 (A) 10/05/2016 1219   NITRITE NEGATIVE 10/05/2016 1219   LEUKOCYTESUR TRACE (A) 10/05/2016 1219   LEUKOCYTESUR 3+ 09/16/2014 0814    Pertinent Imaging: CLINICAL DATA:  Intractable vomiting, recently seen in the ER and diagnosed with renal stones. Continues to have emesis and nausea.  EXAM: CT ABDOMEN AND PELVIS WITHOUT CONTRAST  TECHNIQUE: Multidetector CT imaging of the abdomen and pelvis was performed following the standard protocol without IV contrast.  COMPARISON:  08/31/2016  FINDINGS: Lower chest: Nonacute  Hepatobiliary: No focal liver abnormality is seen. No gallstones, gallbladder wall thickening, or biliary dilatation.  Pancreas: Unremarkable  Spleen: Normal  Adrenals/Urinary Tract: Unremarkable adrenal glands. Punctate nonobstructing calculus in the interpolar left kidney again noted measuring approximately 1 mm. No hydronephrosis on the left. Large staghorn calculus of the right kidney with smaller separate peripheral calcifications in the lower pole are again noted with mild lower pole caliectasis and mild dilatation of the right renal pelvis. Findings are relatively similar to previous study. No ureteral dilatation. 2.1 cm cyst in the upper pole right kidney.  Stomach/Bowel: The stomach is mildly distended with fluid. No small bowel dilatation is noted. Lack of oral contrast limits assessment. No bowel obstruction is  seen. Moderate fecal residue within large bowel without obstruction. What is believed to be the appendix is normal.  Vascular/Lymphatic: Normal caliber of the abdominal aorta. Nonpathologic sized retroperitoneal lymph nodes as well small bilateral inguinal nodes.  Reproductive: Uterus and bilateral adnexa are unremarkable.  Other: No abdominal wall hernia or abnormality. No abdominopelvic ascites.  Musculoskeletal: No acute or significant osseous findings.  IMPRESSION: Bilateral renal calculi without  obstructive uropathy, staghorn on the right and punctate interpolar on the left. Lack of IV contrast limits assessment for pyelonephritis should be correlated clinically. Stable 2.1 cm upper pole right renal cyst.  Lack of oral contrast and intra-abdominal fat limits assessment of small and large bowel. No bowel obstruction is identified.   Electronically Signed   By: Zaynab Chipman Royalty M.D.   On: 10/04/2016 19:17  CT scan reviewed personally today and with the patient.  Assessment & Plan:    1. Staghorn calculus Partial right staghorn calculus involving lower and mid pole with some caliectasis but no obstruction. Asymptomatic. I have recommended proceeding PCNL. Risks of surgery were reviewed today in detail along with the preoperative, intraoperative, and postoperative courses. All of her questions were answered. Given her new diagnosis and worsening acute on chronic renal failure, I would like her clinical status to stabilize and ideally to perform the procedure unless immunosuppression if possible. As such, I have recommended deferring the procedure for 6 weeks and reassessing her clinical status at that time. She is agreeable with this plan. She understands the extreme importance of follow-up and further intervention for this stone. Risk of untreated staghorn was reviewed including risk of cortical loss.  2. Lupus nephritis ([place]) Newly dx s/p renal biopsy Currently on high dose steroids and plaquinil  3. Urinary tract infection without hematuria, site unspecified + UA x 2 with negative urine culture On abx therapy for presumed infection which is reasonable given immunocompromised state and plan for stone surgery in the near future   Return in about 6 weeks (around 11/23/2016) for recheck to possible schedule surgery.  [name], MD  [facility] [address] [place], [place] [address] [phone]

## 2016-10-19 LAB — SURGICAL PATHOLOGY

## 2016-10-24 ENCOUNTER — Encounter: Payer: Self-pay | Admitting: Nephrology

## 2016-11-02 ENCOUNTER — Other Ambulatory Visit
Admission: RE | Admit: 2016-11-02 | Discharge: 2016-11-02 | Disposition: A | Discharge status: Home / Self Care | Payer: Medicaid Other | Source: Ambulatory Visit | Attending: Nephrology | Admitting: Nephrology

## 2016-11-02 DIAGNOSIS — M3214 Glomerular disease in systemic lupus erythematosus: Secondary | ICD-10-CM | POA: Insufficient documentation

## 2016-11-02 LAB — COMPREHENSIVE METABOLIC PANEL
ALT: 19 U/L (ref 14–54)
AST: 25 U/L (ref 15–41)
Albumin: 2.2 g/dL — ABNORMAL LOW (ref 3.5–5.0)
Alkaline Phosphatase: 63 U/L (ref 38–126)
Anion gap: 5 (ref 5–15)
BUN: 19 mg/dL (ref 6–20)
CHLORIDE: 114 mmol/L — AB (ref 101–111)
CO2: 22 mmol/L (ref 22–32)
CREATININE: 2.43 mg/dL — AB (ref 0.44–1.00)
Calcium: 7.7 mg/dL — ABNORMAL LOW (ref 8.9–10.3)
GFR calc Af Amer: 29 mL/min — ABNORMAL LOW (ref >60)
GFR, EST NON AFRICAN AMERICAN: 25 mL/min — AB (ref >60)
GLUCOSE: 88 mg/dL (ref 65–99)
POTASSIUM: 4 mmol/L (ref 3.5–5.1)
SODIUM: 141 mmol/L (ref 135–145)
Total Bilirubin: 0.2 mg/dL — ABNORMAL LOW (ref 0.3–1.2)
Total Protein: 5.9 g/dL — ABNORMAL LOW (ref 6.5–8.1)

## 2016-11-02 LAB — CBC
HEMATOCRIT: 23 % — AB (ref 35.0–47.0)
Hemoglobin: 7.5 g/dL — ABNORMAL LOW (ref 12.0–16.0)
MCH: 30.2 pg (ref 26.0–34.0)
MCHC: 32.8 g/dL (ref 32.0–36.0)
MCV: 92.3 fL (ref 80.0–100.0)
PLATELETS: 290 10*3/uL (ref 150–440)
RBC: 2.49 MIL/uL — ABNORMAL LOW (ref 3.80–5.20)
RDW: 14.1 % (ref 11.5–14.5)
WBC: 4.4 10*3/uL (ref 3.6–11.0)

## 2016-11-03 LAB — C4 COMPLEMENT: Complement C4, Body Fluid: 17 mg/dL (ref 14–44)

## 2016-11-03 LAB — C3 COMPLEMENT: C3 COMPLEMENT: 105 mg/dL (ref 82–167)

## 2016-11-03 LAB — MICROALBUMIN, URINE: Microalb, Ur: 3435.9 ug/mL — ABNORMAL HIGH

## 2016-11-04 LAB — ANTI-DNA ANTIBODY, DOUBLE-STRANDED: DS DNA AB: 12 [IU]/mL — AB (ref 0–9)

## 2016-11-07 ENCOUNTER — Other Ambulatory Visit: Payer: Self-pay | Admitting: Oncology

## 2016-11-07 DIAGNOSIS — M3214 Glomerular disease in systemic lupus erythematosus: Secondary | ICD-10-CM

## 2016-11-07 NOTE — Progress Notes (Deleted)
[facility]  Telephone:([phone] Fax:([phone]) [phone]  ID: VISTA SAWATZKY OB: 1981/08/24  MR#: [id number]  HYI#:[id number]  Patient Care Team: No Pcp Per Patient as PCP - General (General Practice)  CHIEF COMPLAINT: Lupus nephritis.  INTERVAL HISTORY: ***  REVIEW OF SYSTEMS:   ROS  As per HPI. Otherwise, a complete review of systems is negative.  PAST MEDICAL HISTORY: Past Medical History:  Diagnosis Date  . Acute renal failure (ARF) ([place])   . Rash   . Staghorn calculus     PAST SURGICAL HISTORY: Past Surgical History:  Procedure Laterality Date  . TUBAL LIGATION      FAMILY HISTORY: Family History  Problem Relation Age of Onset  . Thyroid disease Mother   . Thyroid disease Father     ADVANCED DIRECTIVES (Y/N):  N  HEALTH MAINTENANCE: Social History  Substance Use Topics  . Smoking status: Current Every Day Smoker    Packs/day: 0.50    Years: 15.00    Types: Cigarettes  . Smokeless tobacco: Never Used  . Alcohol use No     Colonoscopy:  PAP:  Bone density:  Lipid panel:  Allergies  Allergen Reactions  . Penicillins     Yeast infection    Current Outpatient Prescriptions  Medication Sig Dispense Refill  . acetaminophen (TYLENOL) 500 MG tablet Take 1-2 tablets (500-1,000 mg total) by mouth every 6 (six) hours as needed for mild pain. (Patient not taking: Reported on 10/12/2016) 30 tablet 0  . cefUROXime (CEFTIN) 500 MG tablet Take 1 tablet (500 mg total) by mouth daily. 7 tablet 0  . hydroxychloroquine (PLAQUENIL) 200 MG tablet Take 1 tablet (200 mg total) by mouth daily. 30 tablet 0  . metroNIDAZOLE (METROCREAM) 0.75 % cream Apply topically 2 (two) times daily. (Patient not taking: Reported on 10/12/2016) 45 g 0  . predniSONE (DELTASONE) 20 MG tablet Take 3 tablets (60 mg total) by mouth daily with breakfast. 10 tablet 0  . triamcinolone cream (KENALOG) 0.1 % Apply topically 2 (two) times daily. (Patient not taking: Reported on  10/12/2016) 30 g 0   No current facility-administered medications for this visit.     OBJECTIVE: There were no vitals filed for this visit.   There is no height or weight on file to calculate BMI.    ECOG FS:{CHL ONC [id number]  General: Well-developed, well-nourished, no acute distress. Eyes: Pink conjunctiva, anicteric sclera. HEENT: Normocephalic, moist mucous membranes, clear oropharnyx. Lungs: Clear to auscultation bilaterally. Heart: Regular rate and rhythm. No rubs, murmurs, or gallops. Abdomen: Soft, nontender, nondistended. No organomegaly noted, normoactive bowel sounds. Musculoskeletal: No edema, cyanosis, or clubbing. Neuro: Alert, answering all questions appropriately. Cranial nerves grossly intact. Skin: No rashes or petechiae noted. Psych: Normal affect. Lymphatics: No cervical, calvicular, axillary or inguinal LAD.   LAB RESULTS:  Lab Results  Component Value Date   NA 141 11/02/2016   K 4.0 11/02/2016   CL 114 (H) 11/02/2016   CO2 22 11/02/2016   GLUCOSE 88 11/02/2016   BUN 19 11/02/2016   CREATININE 2.43 (H) 11/02/2016   CALCIUM 7.7 (L) 11/02/2016   PROT 5.9 (L) 11/02/2016   ALBUMIN 2.2 (L) 11/02/2016   AST 25 11/02/2016   ALT 19 11/02/2016   ALKPHOS 63 11/02/2016   BILITOT 0.2 (L) 11/02/2016   GFRNONAA 25 (L) 11/02/2016   GFRAA 29 (L) 11/02/2016    Lab Results  Component Value Date   WBC 4.4 11/02/2016   NEUTROABS 2.3 10/07/2016   HGB  7.5 (L) 11/02/2016   HCT 23.0 (L) 11/02/2016   MCV 92.3 11/02/2016   PLT 290 11/02/2016     STUDIES: No results found.  ASSESSMENT: Lupus nephritis  PLAN:    1. Lupus nephritis:  Patient expressed understanding and was in agreement with this plan. She also understands that She can call clinic at any time with any questions, concerns, or complaints.   Cancer Staging No matching staging information was found for the patient.  Lloyd Huger, MD   11/07/2016 11:29 PM

## 2016-11-08 ENCOUNTER — Inpatient Hospital Stay: Payer: MEDICAID

## 2016-11-08 ENCOUNTER — Ambulatory Visit: Payer: Self-pay | Admitting: Pharmacy Technician

## 2016-11-08 ENCOUNTER — Inpatient Hospital Stay: Payer: MEDICAID | Admitting: Oncology

## 2016-11-08 NOTE — Progress Notes (Signed)
Patient scheduled for eligibility appointment at [facility].  Patient did not show for the appointment on 11/08/16 at 2:00pm.  Patient did not reschedule eligibility appointment.  [facility] will be unable to provide ongoing medication assistance until eligibility is determined.  [facility]

## 2016-11-09 NOTE — Progress Notes (Deleted)
Patricia Maldonado  [phone] [phone]  ID: Lin Givens OB: Dec 16, 1980  MR#: [id number]  EVO#:[id number]  Patient Care Team: No Pcp Per Patient as PCP - General (General Practice)  CHIEF COMPLAINT: Lupus nephritis.  INTERVAL HISTORY: ***  REVIEW OF SYSTEMS:   ROS  As per HPI. Otherwise, a complete review of systems is negative.  PAST MEDICAL HISTORY: Past Medical History:  Diagnosis Date  . Acute renal failure (ARF) ([employer])   . Rash   . Staghorn calculus     PAST SURGICAL HISTORY: Past Surgical History:  Procedure Laterality Date  . TUBAL LIGATION      FAMILY HISTORY: Family History  Problem Relation Age of Onset  . Thyroid disease Mother   . Thyroid disease Father     ADVANCED DIRECTIVES (Y/N):  N  HEALTH MAINTENANCE: Social History  Substance Use Topics  . Smoking status: Current Every Day Smoker    Packs/day: 0.50    Years: 15.00    Types: Cigarettes  . Smokeless tobacco: Never Used  . Alcohol use No     Colonoscopy:  PAP:  Bone density:  Lipid panel:  Allergies  Allergen Reactions  . Penicillins     Yeast infection    Current Outpatient Prescriptions  Medication Sig Dispense Refill  . acetaminophen (TYLENOL) 500 MG tablet Take 1-2 tablets (500-1,000 mg total) by mouth every 6 (six) hours as needed for mild pain. (Patient not taking: Reported on 10/12/2016) 30 tablet 0  . cefUROXime (CEFTIN) 500 MG tablet Take 1 tablet (500 mg total) by mouth daily. 7 tablet 0  . hydroxychloroquine (PLAQUENIL) 200 MG tablet Take 1 tablet (200 mg total) by mouth daily. 30 tablet 0  . metroNIDAZOLE (METROCREAM) 0.75 % cream Apply topically 2 (two) times daily. (Patient not taking: Reported on 10/12/2016) 45 g 0  . predniSONE (DELTASONE) 20 MG tablet Take 3 tablets (60 mg total) by mouth daily with breakfast. 10 tablet 0  . triamcinolone cream (KENALOG) 0.1 % Apply topically 2 (two) times daily. (Patient not taking: Reported on  10/12/2016) 30 g 0   No current facility-administered medications for this visit.     OBJECTIVE: There were no vitals filed for this visit.   There is no height or weight on file to calculate BMI.    ECOG FS:{CHL ONC [id number]  General: Well-developed, well-nourished, no acute distress. Eyes: Pink conjunctiva, anicteric sclera. HEENT: Normocephalic, moist mucous membranes, clear oropharnyx. Lungs: Clear to auscultation bilaterally. Heart: Regular rate and rhythm. No rubs, murmurs, or gallops. Abdomen: Soft, nontender, nondistended. No organomegaly noted, normoactive bowel sounds. Musculoskeletal: No edema, cyanosis, or clubbing. Neuro: Alert, answering all questions appropriately. Cranial nerves grossly intact. Skin: No rashes or petechiae noted. Psych: Normal affect. Lymphatics: No cervical, calvicular, axillary or inguinal LAD.   LAB RESULTS:  Lab Results  Component Value Date   NA 141 11/02/2016   K 4.0 11/02/2016   CL 114 (H) 11/02/2016   CO2 22 11/02/2016   GLUCOSE 88 11/02/2016   BUN 19 11/02/2016   CREATININE 2.43 (H) 11/02/2016   CALCIUM 7.7 (L) 11/02/2016   PROT 5.9 (L) 11/02/2016   ALBUMIN 2.2 (L) 11/02/2016   AST 25 11/02/2016   ALT 19 11/02/2016   ALKPHOS 63 11/02/2016   BILITOT 0.2 (L) 11/02/2016   GFRNONAA 25 (L) 11/02/2016   GFRAA 29 (L) 11/02/2016    Lab Results  Component Value Date   WBC 4.4 11/02/2016   NEUTROABS 2.3 10/07/2016   HGB  7.5 (L) 11/02/2016   HCT 23.0 (L) 11/02/2016   MCV 92.3 11/02/2016   PLT 290 11/02/2016     STUDIES: No results found.  ASSESSMENT: Lupus nephritis  PLAN:    1. Lupus nephritis:  Patient expressed understanding and was in agreement with this plan. She also understands that She can call clinic at any time with any questions, concerns, or complaints.   Cancer Staging No matching staging information was found for the patient.  Lloyd Huger, MD   11/09/2016 10:35 PM

## 2016-11-10 ENCOUNTER — Inpatient Hospital Stay: Payer: MEDICAID | Admitting: Oncology

## 2016-11-10 ENCOUNTER — Inpatient Hospital Stay: Payer: MEDICAID

## 2016-11-11 ENCOUNTER — Other Ambulatory Visit: Payer: Self-pay

## 2016-11-18 ENCOUNTER — Ambulatory Visit: Payer: Self-pay | Admitting: Urology

## 2016-11-20 ENCOUNTER — Telehealth: Payer: Self-pay | Admitting: Urology

## 2016-11-20 NOTE — Telephone Encounter (Signed)
Patient was in no show for clinic on 11/18/2016 to discuss management of her staghorn renal calculus.  I attempted to contact her via telephone number listed and phone went straight to voice filled and unable to leave a message.  Upon review of her chart, it appears that she is missed several appointments with other providers which is concerning.  Could you please send her a letter stressing the importance of follow-up with [place] as well as all of her physicians including nephrology and rheumatology?  [name], MD

## 2016-11-21 ENCOUNTER — Encounter: Payer: Self-pay | Admitting: Urology

## 2016-11-21 ENCOUNTER — Telehealth: Payer: Self-pay | Admitting: Urology

## 2016-11-21 NOTE — Telephone Encounter (Signed)
Can you call this patient to follow up on her no showed app please if no response then send a certified letter please.  Thanks,  Sharyn Lull

## 2016-11-21 NOTE — Telephone Encounter (Signed)
We will send her a no show letter.  Sharyn Lull

## 2016-11-21 NOTE — Telephone Encounter (Signed)
Certified letter sent. Tracking #[id number]

## 2016-11-28 ENCOUNTER — Telehealth: Payer: Self-pay

## 2016-11-28 NOTE — Telephone Encounter (Signed)
Pt signed for certified letter on 5/37/94.

## 2016-12-01 DIAGNOSIS — M3214 Glomerular disease in systemic lupus erythematosus: Secondary | ICD-10-CM

## 2016-12-01 HISTORY — DX: Glomerular disease in systemic lupus erythematosus: M32.14

## 2016-12-04 NOTE — Progress Notes (Signed)
[facility]  [phone] Fax:(336) [phone]  ID: Lin Givens OB: 08/19/1981  MR#: [id number]  OZD#:[id number]  Patient Care Team: No Pcp Per Patient as PCP - General (General Practice)  CHIEF COMPLAINT: Lupus nephritis.  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 She received her initial dose of Cytoxan well in the hospital greater than one month ago. She has had difficulty with transportation and has missed multiple appointments recently. She currently feels well and is asymptomatic. She has no neurologic complaints. She denies any recent fevers or illnesses. She has a good appetite and denies weight loss. She has no chest pain or shortness of breath. She denies any nausea, vomiting, constipation, or diarrhea. She has no urinary complaints. Patient offers no specific complaints today.  REVIEW OF SYSTEMS:   Review of Systems  Constitutional: Negative.  Negative for fever, malaise/fatigue and weight loss.  Respiratory: Negative.  Negative for cough and shortness of breath.   Cardiovascular: Negative.  Negative for chest pain and leg swelling.  Gastrointestinal: Negative.  Negative for abdominal pain.  Genitourinary: Negative.   Neurological: Negative for weakness.  Psychiatric/Behavioral: Negative.  The patient is not nervous/anxious.     As per HPI. Otherwise, a complete review of systems is negative.  PAST MEDICAL HISTORY: Past Medical History:  Diagnosis Date  . Acute renal failure (ARF) ([place])   . Rash   . Staghorn calculus     PAST SURGICAL HISTORY: Past Surgical History:  Procedure Laterality Date  . TUBAL LIGATION      FAMILY HISTORY: Family History  Problem Relation Age of Onset  . Thyroid disease Mother   . Thyroid disease Father   . Diabetes Father     ADVANCED DIRECTIVES (Y/N):  N  HEALTH MAINTENANCE: Social History  Substance Use Topics  . Smoking status:  Current Every Day Smoker    Packs/day: 1.00    Years: 15.00    Types: Cigarettes  . Smokeless tobacco: Never Used  . Alcohol use No     Colonoscopy:  PAP:  Bone density:  Lipid panel:  Allergies  Allergen Reactions  . Penicillins     Yeast infection    Current Outpatient Prescriptions  Medication Sig Dispense Refill  . predniSONE (DELTASONE) 20 MG tablet Take 3 tablets (60 mg total) by mouth daily with breakfast. 10 tablet 0  . acetaminophen (TYLENOL) 500 MG tablet Take 1-2 tablets (500-1,000 mg total) by mouth every 6 (six) hours as needed for mild pain. (Patient not taking: Reported on 10/12/2016) 30 tablet 0  . amLODipine (NORVASC) 5 MG tablet Take 5 mg by mouth daily.  11  . cefUROXime (CEFTIN) 500 MG tablet Take 1 tablet (500 mg total) by mouth daily. (Patient not taking: Reported on 12/05/2016) 7 tablet 0  . hydroxychloroquine (PLAQUENIL) 200 MG tablet Take 1 tablet (200 mg total) by mouth daily. (Patient not taking: Reported on 12/05/2016) 30 tablet 0  . metroNIDAZOLE (METROCREAM) 0.75 % cream Apply topically 2 (two) times daily. (Patient not taking: Reported on 10/12/2016) 45 g 0  . triamcinolone cream (KENALOG) 0.1 % Apply topically 2 (two) times daily. (Patient not taking: Reported on 10/12/2016) 30 g 0   No current facility-administered medications for this visit.     OBJECTIVE: Vitals:   12/05/16 1032  BP: 136/88  Pulse: 86  Resp: 18  Temp: 98.2 F (36.8 C)     Body mass index is 18.67 kg/m.  ECOG FS:0 - Asymptomatic  General: Well-developed, well-nourished, no acute distress. Eyes: Pink conjunctiva, anicteric sclera. HEENT: Normocephalic, moist mucous membranes, clear oropharnyx. Lungs: Clear to auscultation bilaterally. Heart: Regular rate and rhythm. No rubs, murmurs, or gallops. Abdomen: Soft, nontender, nondistended. No organomegaly noted, normoactive bowel sounds. Musculoskeletal: No edema, cyanosis, or clubbing. Neuro: Alert, answering all questions  appropriately. Cranial nerves grossly intact. Skin: No rashes or petechiae noted. Psych: Normal affect. Lymphatics: No cervical, calvicular, axillary or inguinal LAD.   LAB RESULTS:  Lab Results  Component Value Date   NA 141 12/05/2016   K 3.4 (L) 12/05/2016   CL 108 12/05/2016   CO2 26 12/05/2016   GLUCOSE 96 12/05/2016   BUN 26 (H) 12/05/2016   CREATININE 1.62 (H) 12/05/2016   CALCIUM 9.1 12/05/2016   PROT 5.9 (L) 11/02/2016   ALBUMIN 2.2 (L) 11/02/2016   AST 25 11/02/2016   ALT 19 11/02/2016   ALKPHOS 63 11/02/2016   BILITOT 0.2 (L) 11/02/2016   GFRNONAA 41 (L) 12/05/2016   GFRAA 47 (L) 12/05/2016    Lab Results  Component Value Date   WBC 10.3 12/05/2016   NEUTROABS 8.3 (H) 12/05/2016   HGB 10.8 (L) 12/05/2016   HCT 32.5 (L) 12/05/2016   MCV 94.6 12/05/2016   PLT 288 12/05/2016     STUDIES: No results found.  ASSESSMENT: Lupus nephritis  PLAN:    1. Lupus nephritis: Proceed with 500 mg/m of IV Cytoxan today. Patient will also receive 10 mg IV Decadron and 0.25 mg Aloxi as premedications. Patient expressed understanding that all of her laboratory work will be monitored by nephrology. If she has any questions or concerns regarding her lupus nephritis or her treatments should be deferred to them as well. Plan is to give 5-6 additional treatments every 28 days. Return to clinic in 1 month for consideration of her next infusion of Cytoxan. 2. Anemia: Mild, likely secondary to chronic renal insufficiency. Monitor.  Approximately 45 minutes was spent in discussion of which greater than 50% was consultation.  Patient expressed understanding and was in agreement with this plan. She also understands that She can call clinic at any time with any questions, concerns, or complaints.   Lloyd Huger, MD   12/07/2016 2:30 PM

## 2016-12-05 ENCOUNTER — Inpatient Hospital Stay (HOSPITAL_BASED_OUTPATIENT_CLINIC_OR_DEPARTMENT_OTHER): Payer: Medicaid Other | Admitting: Oncology

## 2016-12-05 ENCOUNTER — Ambulatory Visit: Payer: Self-pay

## 2016-12-05 ENCOUNTER — Inpatient Hospital Stay: Payer: Medicaid Other

## 2016-12-05 ENCOUNTER — Inpatient Hospital Stay: Payer: Medicaid Other | Attending: Oncology

## 2016-12-05 ENCOUNTER — Encounter: Payer: Self-pay | Admitting: Oncology

## 2016-12-05 VITALS — BP 136/88 | HR 86 | Temp 98.2°F | Resp 18 | Wt 102.1 lb

## 2016-12-05 DIAGNOSIS — Z7952 Long term (current) use of systemic steroids: Secondary | ICD-10-CM

## 2016-12-05 DIAGNOSIS — Z79899 Other long term (current) drug therapy: Secondary | ICD-10-CM

## 2016-12-05 DIAGNOSIS — Z5111 Encounter for antineoplastic chemotherapy: Secondary | ICD-10-CM | POA: Diagnosis present

## 2016-12-05 DIAGNOSIS — D649 Anemia, unspecified: Secondary | ICD-10-CM | POA: Insufficient documentation

## 2016-12-05 DIAGNOSIS — N179 Acute kidney failure, unspecified: Secondary | ICD-10-CM

## 2016-12-05 DIAGNOSIS — M3214 Glomerular disease in systemic lupus erythematosus: Secondary | ICD-10-CM | POA: Diagnosis not present

## 2016-12-05 DIAGNOSIS — N189 Chronic kidney disease, unspecified: Secondary | ICD-10-CM | POA: Diagnosis not present

## 2016-12-05 DIAGNOSIS — Z880 Allergy status to penicillin: Secondary | ICD-10-CM | POA: Insufficient documentation

## 2016-12-05 DIAGNOSIS — Z87442 Personal history of urinary calculi: Secondary | ICD-10-CM | POA: Diagnosis not present

## 2016-12-05 DIAGNOSIS — F17210 Nicotine dependence, cigarettes, uncomplicated: Secondary | ICD-10-CM | POA: Diagnosis not present

## 2016-12-05 LAB — BASIC METABOLIC PANEL
Anion gap: 7 (ref 5–15)
BUN: 26 mg/dL — AB (ref 6–20)
CHLORIDE: 108 mmol/L (ref 101–111)
CO2: 26 mmol/L (ref 22–32)
CREATININE: 1.62 mg/dL — AB (ref 0.44–1.00)
Calcium: 9.1 mg/dL (ref 8.9–10.3)
GFR calc Af Amer: 47 mL/min — ABNORMAL LOW (ref >60)
GFR, EST NON AFRICAN AMERICAN: 41 mL/min — AB (ref >60)
GLUCOSE: 96 mg/dL (ref 65–99)
POTASSIUM: 3.4 mmol/L — AB (ref 3.5–5.1)
SODIUM: 141 mmol/L (ref 135–145)

## 2016-12-05 LAB — CBC WITH DIFFERENTIAL/PLATELET
BASOS ABS: 0 10*3/uL (ref 0–0.1)
BASOS PCT: 0 %
EOS ABS: 0 10*3/uL (ref 0–0.7)
EOS PCT: 0 %
HCT: 32.5 % — ABNORMAL LOW (ref 35.0–47.0)
Hemoglobin: 10.8 g/dL — ABNORMAL LOW (ref 12.0–16.0)
Lymphocytes Relative: 14 %
Lymphs Abs: 1.5 10*3/uL (ref 1.0–3.6)
MCH: 31.5 pg (ref 26.0–34.0)
MCHC: 33.3 g/dL (ref 32.0–36.0)
MCV: 94.6 fL (ref 80.0–100.0)
Monocytes Absolute: 0.5 10*3/uL (ref 0.2–0.9)
Monocytes Relative: 5 %
NEUTROS PCT: 81 %
Neutro Abs: 8.3 10*3/uL — ABNORMAL HIGH (ref 1.4–6.5)
PLATELETS: 288 10*3/uL (ref 150–440)
RBC: 3.44 MIL/uL — AB (ref 3.80–5.20)
RDW: 15.6 % — ABNORMAL HIGH (ref 11.5–14.5)
WBC: 10.3 10*3/uL (ref 3.6–11.0)

## 2016-12-05 LAB — PROTEIN / CREATININE RATIO, URINE
CREATININE, URINE: 114 mg/dL
PROTEIN CREATININE RATIO: 3.69 mg/mg{creat} — AB (ref 0.00–0.15)
TOTAL PROTEIN, URINE: 421 mg/dL

## 2016-12-05 MED ORDER — SODIUM CHLORIDE 0.9 % IV SOLN
700.0000 mg | Freq: Once | INTRAVENOUS | Status: AC
  Administered 2016-12-05: 700 mg via INTRAVENOUS
  Filled 2016-12-05: qty 35

## 2016-12-05 MED ORDER — PALONOSETRON HCL INJECTION 0.25 MG/5ML
0.2500 mg | Freq: Once | INTRAVENOUS | Status: AC
  Administered 2016-12-05: 0.25 mg via INTRAVENOUS
  Filled 2016-12-05: qty 5

## 2016-12-05 MED ORDER — SODIUM CHLORIDE 0.9 % IV SOLN
Freq: Once | INTRAVENOUS | Status: AC
  Administered 2016-12-05: 12:00:00 via INTRAVENOUS
  Filled 2016-12-05: qty 1000

## 2016-12-05 MED ORDER — SODIUM CHLORIDE 0.9 % IV SOLN: 10.0000 mg | Freq: Once | INTRAVENOUS | Status: DC

## 2016-12-05 MED ORDER — DEXAMETHASONE SODIUM PHOSPHATE 10 MG/ML IJ SOLN
10.0000 mg | Freq: Once | INTRAMUSCULAR | Status: AC
  Administered 2016-12-05: 10 mg via INTRAVENOUS
  Filled 2016-12-05: qty 1

## 2016-12-05 NOTE — Progress Notes (Signed)
Patient here today as hospital follow up regarding lupus nephritis.  Patient states she has a cough.  Gets SOB with exertion. Nauseated in the mornings.  States she has 2 kidney stones one in each side.  Also states her hands cramp and draw inward.  Has cramps in feet and legs.

## 2016-12-05 NOTE — Progress Notes (Signed)
Proceed with Cytoxan as planned per Dr. Grayland Ormond. LJ

## 2016-12-06 LAB — C3 COMPLEMENT: C3 Complement: 104 mg/dL (ref 82–167)

## 2016-12-06 LAB — C4 COMPLEMENT: Complement C4, Body Fluid: 14 mg/dL (ref 14–44)

## 2016-12-08 DIAGNOSIS — L930 Discoid lupus erythematosus: Secondary | ICD-10-CM | POA: Insufficient documentation

## 2016-12-08 DIAGNOSIS — M329 Systemic lupus erythematosus, unspecified: Secondary | ICD-10-CM | POA: Insufficient documentation

## 2016-12-08 DIAGNOSIS — Z79899 Other long term (current) drug therapy: Secondary | ICD-10-CM | POA: Insufficient documentation

## 2016-12-09 ENCOUNTER — Encounter: Payer: Self-pay | Admitting: Urology

## 2016-12-09 ENCOUNTER — Ambulatory Visit (INDEPENDENT_AMBULATORY_CARE_PROVIDER_SITE_OTHER): Payer: Medicaid Other | Admitting: Urology

## 2016-12-09 VITALS — BP 124/72 | HR 117 | Ht 62.0 in | Wt 100.0 lb

## 2016-12-09 DIAGNOSIS — M3214 Glomerular disease in systemic lupus erythematosus: Secondary | ICD-10-CM | POA: Diagnosis not present

## 2016-12-09 DIAGNOSIS — N200 Calculus of kidney: Secondary | ICD-10-CM

## 2016-12-09 NOTE — Progress Notes (Signed)
12/09/2016 3:21 PM   Patricia Maldonado Nov 19, 1980 [id number]  Referring provider: No referring provider defined for this encounter.  Chief Complaint  Patient presents with  . Follow-up    6wk    HPI: 36 year old female who returns to the office today to discuss management of her right staghorn calculus. 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  Her current medical regimen includes prednisone 40 mg, amlodipine 5 mg, Cytoxan.  She is unsure how long she will be on these medications.  She has been tolerating them fairly well. Initially, she had difficulty following up to her appointments but has since gotten Medicaid and transportation to all medical appointments.  She does have occasional intermittent right flank pain. This comes and goes. No gross hematuria. No fevers or chills.  No dysuria.    Her overall renal function is improving, most recent creatinine 1.6 to down from as high as 4.27 during her previous admission.  PMH: Past Medical History:  Diagnosis Date  . Acute renal failure (ARF) ([place])   . Rash   . Staghorn calculus     Surgical History: Past Surgical History:  Procedure Laterality Date  . TUBAL LIGATION      Home Medications:  Allergies as of 12/09/2016      Reactions   Penicillins    Yeast infection      Medication List       Accurate as of 12/09/16  3:21 PM. Always use your most recent med list.          acetaminophen 500 MG tablet Commonly known as:  TYLENOL Take 1-2 tablets (500-1,000 mg total) by mouth every 6 (six) hours as needed for mild pain.   amLODipine 5 MG tablet Commonly known as:  NORVASC Take 5 mg by mouth daily.   CYTOXAN LYOPHILIZED IV Inject into the vein.   hydroxychloroquine 200 MG tablet Commonly known as:  PLAQUENIL Take 1 tablet (200 mg total) by mouth daily.   metroNIDAZOLE 0.75 % cream Commonly known as:  METROCREAM Apply topically 2 (two) times  daily.   predniSONE 20 MG tablet Commonly known as:  DELTASONE Take 3 tablets (60 mg total) by mouth daily with breakfast.   triamcinolone cream 0.1 % Commonly known as:  KENALOG Apply topically 2 (two) times daily.       Allergies:  Allergies  Allergen Reactions  . Penicillins     Yeast infection    Family History: Family History  Problem Relation Age of Onset  . Thyroid disease Mother   . Thyroid disease Father   . Diabetes Father     Social History:  reports that she has been smoking Cigarettes.  She has a 15.00 pack-year smoking history. She has never used smokeless tobacco. She reports that she uses drugs, including Marijuana. She reports that she does not drink alcohol.  ROS: UROLOGY Frequent Urination?: No Hard to postpone urination?: No Burning/pain with urination?: No Get up at night to urinate?: No Leakage of urine?: No Urine stream starts and stops?: No Trouble starting stream?: No Do you have to strain to urinate?: No Blood in urine?: No Urinary tract infection?: No Sexually transmitted disease?: No Injury to kidneys or bladder?: No Painful intercourse?: No Weak stream?: No Currently pregnant?: No Vaginal bleeding?: No Last menstrual period?: n  Gastrointestinal Nausea?: No Vomiting?: No Indigestion/heartburn?: No Diarrhea?: No Constipation?: No  Constitutional Fever: No Night sweats?: No Weight loss?: No Fatigue?: No  Skin Skin rash/lesions?: No Itching?: No  Eyes Blurred vision?: No Double vision?: No  Ears/Nose/Throat Sore throat?: No Sinus problems?: No  Hematologic/Lymphatic Swollen glands?: No Easy bruising?: No  Cardiovascular Leg swelling?: No Chest pain?: No  Respiratory Cough?: No Shortness of breath?: No  Endocrine Excessive thirst?: No  Musculoskeletal Back pain?: No Joint pain?: No  Neurological Headaches?: Yes Dizziness?: Yes  Psychologic Depression?: No Anxiety?: No  Physical Exam: BP  124/72   Pulse (!) 117   Ht 5\' 2"  (1.575 m)   Wt 100 lb (45.4 kg)   BMI 18.29 kg/m   Constitutional:  Alert and oriented, No acute distress. HEENT: [place] AT, moist mucus membranes.  Trachea midline, no masses.  Facial fullness appreciated. Cardiovascular: No clubbing, cyanosis, or edema. Respiratory: Normal respiratory effort, no increased work of breathing. GI: Abdomen is soft, nontender, nondistended, no abdominal masses GU: No CVA tenderness.  Skin: Mild papillary facial rash with hypopigmentation. Neurologic: Grossly intact, no focal deficits, moving all 4 extremities. Psychiatric: Normal mood and affect.  Laboratory Data: Lab Results  Component Value Date   WBC 10.3 12/05/2016   HGB 10.8 (L) 12/05/2016   HCT 32.5 (L) 12/05/2016   MCV 94.6 12/05/2016   PLT 288 12/05/2016    Lab Results  Component Value Date   CREATININE 1.62 (H) 12/05/2016   Urinalysis N/a  Pertinent Imaging: CLINICAL DATA:  Intractable vomiting, recently seen in the ER and diagnosed with renal stones. Continues to have emesis and nausea.  EXAM: CT ABDOMEN AND PELVIS WITHOUT CONTRAST  TECHNIQUE: Multidetector CT imaging of the abdomen and pelvis was performed following the standard protocol without IV contrast.  COMPARISON:  08/31/2016  FINDINGS: Lower chest: Nonacute  Hepatobiliary: No focal liver abnormality is seen. No gallstones, gallbladder wall thickening, or biliary dilatation.  Pancreas: Unremarkable  Spleen: Normal  Adrenals/Urinary Tract: Unremarkable adrenal glands. Punctate nonobstructing calculus in the interpolar left kidney again noted measuring approximately 1 mm. No hydronephrosis on the left. Large staghorn calculus of the right kidney with smaller separate peripheral calcifications in the lower pole are again noted with mild lower pole caliectasis and mild dilatation of the right renal pelvis. Findings are relatively similar to previous study. No ureteral  dilatation. 2.1 cm cyst in the upper pole right kidney.  Stomach/Bowel: The stomach is mildly distended with fluid. No small bowel dilatation is noted. Lack of oral contrast limits assessment. No bowel obstruction is seen. Moderate fecal residue within large bowel without obstruction. What is believed to be the appendix is normal.  Vascular/Lymphatic: Normal caliber of the abdominal aorta. Nonpathologic sized retroperitoneal lymph nodes as well small bilateral inguinal nodes.  Reproductive: Uterus and bilateral adnexa are unremarkable.  Other: No abdominal wall hernia or abnormality. No abdominopelvic ascites.  Musculoskeletal: No acute or significant osseous findings.  IMPRESSION: Bilateral renal calculi without obstructive uropathy, staghorn on the right and punctate interpolar on the left. Lack of IV contrast limits assessment for pyelonephritis should be correlated clinically. Stable 2.1 cm upper pole right renal cyst.  Lack of oral contrast and intra-abdominal fat limits assessment of small and large bowel. No bowel obstruction is identified.   Electronically Signed   By:  Royalty M.D.   On: 10/04/2016 19:17  CT scan reviewed today again personally today with the patient.  Assessment & Plan:    1. Staghorn calculus Partial right staghorn calculus involving lower and mid pole with some caliectasis but no obstruction. Asymptomatic. I have recommended proceeding PCNL as per previous discussions.  Risks of surgery were reviewed today in detail along with the preoperative, intraoperative, and postoperative courses. All of her questions were answered.  Specifically today, risk of bleeding, infection, damage is running structures, renal loss, need for blood transfusion, most others were discussed.   2. Lupus nephritis ([place]) Her case was discussed with Dr. Holley Raring as well as her rheumatologist Dr. Meda Coffee. Based on her discussion, she will need to continue Cytoxan for  at least 6 months As such, we will plan to proceed with PCNL during the 3rd  or4th week of her cycle Prefer to avoid waiting until entire course was completely given partially obstructive nature of the stone and infectious risk  Schedule surgery (right PCNL)  [name], MD  [facility] [address], [place], [place] [address] [phone]  I spent 25 min with this patient of which greater than 50% was spent in counseling and coordination of care with the patient.

## 2016-12-27 ENCOUNTER — Other Ambulatory Visit: Payer: Self-pay

## 2016-12-27 DIAGNOSIS — N200 Calculus of kidney: Secondary | ICD-10-CM

## 2017-01-02 ENCOUNTER — Other Ambulatory Visit: Payer: Self-pay | Admitting: Radiology

## 2017-01-04 NOTE — Progress Notes (Deleted)
[place]  [phone] [phone]  ID: JACQUALYN SEDGWICK OB: 31-Aug-1981  MR#: [id number]  BSW#:[id number]  Patient Care Team: No Pcp Per Patient as PCP - General ([place]) [place] (General Practice)  CHIEF COMPLAINT: Lupus nephritis.  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 She received her initial dose of Cytoxan well in the hospital greater than one month ago. She has had difficulty with transportation and has missed multiple appointments recently. She currently feels well and is asymptomatic. She has no neurologic complaints. She denies any recent fevers or illnesses. She has a good appetite and denies weight loss. She has no chest pain or shortness of breath. She denies any nausea, vomiting, constipation, or diarrhea. She has no urinary complaints. Patient offers no specific complaints today.  REVIEW OF SYSTEMS:   Review of Systems  Constitutional: Negative.  Negative for fever, malaise/fatigue and weight loss.  Respiratory: Negative.  Negative for cough and shortness of breath.   Cardiovascular: Negative.  Negative for chest pain and leg swelling.  Gastrointestinal: Negative.  Negative for abdominal pain.  Genitourinary: Negative.   Neurological: Negative for weakness.  Psychiatric/Behavioral: Negative.  The patient is not nervous/anxious.     As per HPI. Otherwise, a complete review of systems is negative.  PAST MEDICAL HISTORY: Past Medical History:  Diagnosis Date  . Acute renal failure (ARF) ([place])   . Rash   . Staghorn calculus     PAST SURGICAL HISTORY: Past Surgical History:  Procedure Laterality Date  . TUBAL LIGATION      FAMILY HISTORY: Family History  Problem Relation Age of Onset  . Thyroid disease Mother   . Thyroid disease Father   . Diabetes Father     ADVANCED DIRECTIVES (Y/N):  N  HEALTH  MAINTENANCE: Social History  Substance Use Topics  . Smoking status: Current Every Day Smoker    Packs/day: 1.00    Years: 15.00    Types: Cigarettes  . Smokeless tobacco: Never Used  . Alcohol use No     Colonoscopy:  PAP:  Bone density:  Lipid panel:  Allergies  Allergen Reactions  . Penicillins     Yeast infection    Current Outpatient Prescriptions  Medication Sig Dispense Refill  . acetaminophen (TYLENOL) 500 MG tablet Take 1-2 tablets (500-1,000 mg total) by mouth every 6 (six) hours as needed for mild pain. 30 tablet 0  . amLODipine (NORVASC) 5 MG tablet Take 5 mg by mouth daily.  11  . Cyclophosphamide (CYTOXAN LYOPHILIZED IV) Inject into the vein.    . hydroxychloroquine (PLAQUENIL) 200 MG tablet Take 1 tablet (200 mg total) by mouth daily. 30 tablet 0  . metroNIDAZOLE (METROCREAM) 0.75 % cream Apply topically 2 (two) times daily. 45 g 0  . predniSONE (DELTASONE) 20 MG tablet Take 3 tablets (60 mg total) by mouth daily with breakfast. 10 tablet 0  . triamcinolone cream (KENALOG) 0.1 % Apply topically 2 (two) times daily. 30 g 0   No current facility-administered medications for this visit.     OBJECTIVE: There were no vitals filed for this visit.   There is no height or weight on file to calculate BMI.    ECOG FS:0 - Asymptomatic  General: Well-developed, well-nourished, no acute distress. Eyes: Pink conjunctiva, anicteric sclera. HEENT: Normocephalic, moist mucous membranes, clear oropharnyx. Lungs: Clear to auscultation bilaterally. Heart: Regular rate and rhythm. No rubs, murmurs, or gallops. Abdomen:  Soft, nontender, nondistended. No organomegaly noted, normoactive bowel sounds. Musculoskeletal: No edema, cyanosis, or clubbing. Neuro: Alert, answering all questions appropriately. Cranial nerves grossly intact. Skin: No rashes or petechiae noted. Psych: Normal affect. Lymphatics: No cervical, calvicular, axillary or inguinal LAD.   LAB RESULTS:  Lab  Results  Component Value Date   NA 141 12/05/2016   K 3.4 (L) 12/05/2016   CL 108 12/05/2016   CO2 26 12/05/2016   GLUCOSE 96 12/05/2016   BUN 26 (H) 12/05/2016   CREATININE 1.62 (H) 12/05/2016   CALCIUM 9.1 12/05/2016   PROT 5.9 (L) 11/02/2016   ALBUMIN 2.2 (L) 11/02/2016   AST 25 11/02/2016   ALT 19 11/02/2016   ALKPHOS 63 11/02/2016   BILITOT 0.2 (L) 11/02/2016   GFRNONAA 41 (L) 12/05/2016   GFRAA 47 (L) 12/05/2016    Lab Results  Component Value Date   WBC 10.3 12/05/2016   NEUTROABS 8.3 (H) 12/05/2016   HGB 10.8 (L) 12/05/2016   HCT 32.5 (L) 12/05/2016   MCV 94.6 12/05/2016   PLT 288 12/05/2016     STUDIES: No results found.  ASSESSMENT: Lupus nephritis  PLAN:    1. Lupus nephritis: Proceed with 500 mg/m of IV Cytoxan today. Patient will also receive 10 mg IV Decadron and 0.25 mg Aloxi as premedications. Patient expressed understanding that all of her laboratory work will be monitored by nephrology. If she has any questions or concerns regarding her lupus nephritis or her treatments should be deferred to them as well. Plan is to give 5-6 additional treatments every 28 days. Return to clinic in 1 month for consideration of her next infusion of Cytoxan. 2. Anemia: Mild, likely secondary to chronic renal insufficiency. Monitor.  Approximately 45 minutes was spent in discussion of which greater than 50% was consultation.  Patient expressed understanding and was in agreement with this plan. She also understands that She can call clinic at any time with any questions, concerns, or complaints.   Lloyd Huger, MD   01/04/2017 11:41 PM

## 2017-01-05 ENCOUNTER — Inpatient Hospital Stay: Payer: Medicaid Other | Admitting: Oncology

## 2017-01-05 ENCOUNTER — Inpatient Hospital Stay: Payer: Medicaid Other

## 2017-01-10 ENCOUNTER — Telehealth: Payer: Self-pay | Admitting: Radiology

## 2017-01-10 ENCOUNTER — Other Ambulatory Visit: Payer: Self-pay | Admitting: Radiology

## 2017-01-10 DIAGNOSIS — N200 Calculus of kidney: Secondary | ICD-10-CM

## 2017-01-10 NOTE — Telephone Encounter (Signed)
Notified pt of surgery scheduled with Dr Erlene Quan on 01/23/17, pre-admission phone interview on 4/13 & to arrive to [facility] at 6:00 on 01/23/17. Questions were answered to pt's satisfaction & pt voices understanding.

## 2017-01-13 ENCOUNTER — Inpatient Hospital Stay: Admission: RE | Admit: 2017-01-13 | Payer: Medicaid Other | Source: Ambulatory Visit

## 2017-01-16 ENCOUNTER — Other Ambulatory Visit: Payer: Medicaid Other

## 2017-01-17 ENCOUNTER — Encounter
Admission: RE | Admit: 2017-01-17 | Discharge: 2017-01-17 | Disposition: A | Discharge status: Home / Self Care | Payer: Medicaid Other | Source: Ambulatory Visit | Attending: Urology | Admitting: Urology

## 2017-01-17 HISTORY — DX: Glomerular disease in systemic lupus erythematosus: M32.14

## 2017-01-17 HISTORY — DX: Personal history of urinary calculi: Z87.442

## 2017-01-17 HISTORY — DX: Essential (primary) hypertension: I10

## 2017-01-17 HISTORY — DX: Systemic lupus erythematosus, unspecified: M32.9

## 2017-01-17 HISTORY — DX: Anemia, unspecified: D64.9

## 2017-01-17 NOTE — Pre-Procedure Instructions (Signed)
Progress Notes Date of Service: 10/11/2016 10:54 AM Munsoor Holley Raring, MD  Nephrology  Expand All Collapse All   [] Hide copied text  Subjective:  Patient confirmed to have lupus nephritis. [facility] nephro pathology. We spent considerable time today talking about therapy. After discussion we decided upon cyclophosphamide as treatment. We discussed risks, benefits, and alternatives to cyclophosphamide. Of note patient has history of bilateral tubal ligation and isn't planning to have any further children.   Objective:  Vital signs in last 24 hours:  Temp:  [97.6 F (36.4 C)-98.4 F (36.9 C)] 98.1 F (36.7 C) (01/09 0900) Pulse Rate:  [80-95] 81 (01/09 0900) Resp:  [16-20] 20 (01/09 0900) BP: (113-122)/(75-86) 121/77 (01/09 0900) SpO2:  [97 %-100 %] 98 % (01/09 0900)  Weight change:     Filed Weights   10/04/16 1727  Weight: 45.4 kg (100 lb)    Intake/Output:    Intake/Output Summary (Last 24 hours) at 10/11/16 1054 Last data filed at 10/11/16 0539  Gross per 24 hour  Intake              300 ml  Output              900 ml  Net             -600 ml     Physical Exam: General: No acute distress, laying in the bed,   HEENT alopecia, moist oral mucous membranes   Neck Supple   Pulm/lungs Normal breathing effort, clear to auscultation   CVS/Heart Regular rate and rhythm, no rub or gallop   Abdomen:  Soft, nontender , bowel sounds present  Extremities: trace peripheral edema   Neurologic: Alert, oriented   Skin: Facial rash and hyperpigmentation noted           Basic Metabolic Panel:   LastLabs   Recent Labs Lab 10/05/16 0547 10/06/16 0430 10/08/16 0553 10/09/16 0838 10/11/16 0442  NA 139 138 135 135 135  K 4.8 4.6 5.3* 4.9 4.7  CL 116* 118* 115* 114* 113*  CO2 20* 18* 16* 17* 17*  GLUCOSE 89 97 124* 100* 88  BUN 26* 24* 44* 56* 63*  CREATININE 3.60* 3.82* 4.27* 4.21* 3.54*  CALCIUM 7.4* 7.2* 7.4* 7.5* 7.8*        CBC:  LastLabs   Recent Labs Lab 10/06/16 0430 10/07/16 0419 10/07/16 1704 10/08/16 0553 10/09/16 0838 10/11/16 0442  WBC 3.5* 3.1* 6.7 8.1  --  9.6  NEUTROABS  --  2.3  --   --   --   --   HGB 8.3* 9.4* 9.3* 8.7* 9.1* 8.6*  HCT 25.1* 27.8* 27.9* 25.9*  --  25.8*  MCV 92.3 91.7 90.7 90.8  --  90.1  PLT 130* 145* 160 149*  --  167        Microbiology:         Recent Results (from the past 720 hour(s))  Urine culture     Status: Abnormal   Collection Time: 10/05/16 12:19 PM  Result Value Ref Range Status   Specimen Description URINE, CATHETERIZED  Final   Special Requests NONE  Final   Culture (A)  Final    <10,000 COLONIES/mL INSIGNIFICANT GROWTH Performed at [facility]    Report Status 10/06/2016 FINAL  Final    Coagulation Studies: RecentLabs(last2labs)  No results for input(s): LABPROT, INR in the last 72 hours.    Urinalysis:  RecentLabs(last2labs)  No results for input(s): COLORURINE, LABSPEC, [place], [place], [place], [place],  KETONESUR, PROTEINUR, UROBILINOGEN, NITRITE, LEUKOCYTESUR in the last 72 hours.  Invalid input(s): APPERANCEUR      Imaging: ImagingResults(Last48hours)  No results found.     Medications:   . sodium chloride     . cefUROXime  500 mg Oral Daily  . cyclophosphamide  500 mg/m2 Intravenous Once  . docusate sodium  100 mg Oral BID  . feeding supplement (ENSURE ENLIVE)  237 mL Oral BID BM  . hydroxychloroquine  200 mg Oral Daily  . pantoprazole (PROTONIX) IV  40 mg Intravenous Q12H  . predniSONE  60 mg Oral Q breakfast  . sucralfate  1 g Oral TID WC & HS   [DISCONTINUED] acetaminophen **OR** acetaminophen, acetaminophen, morphine injection, ondansetron **OR** ondansetron (ZOFRAN) IV, oxyCODONE-acetaminophen  Assessment/ Plan:  36 y.o. female with recent acute renal failure, left staghorn calculus, urinary tract infection, presents for nausea vomiting  and decreased oral intake for 3 days  1. Recurrent acute renal failure 2. 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 Recent UTI- urine culture neg  5. SLE with glomerular involvement (lupus nephritis)   Previous workup from November 2017 shows positive ENA, positive double-stranded DNA, chromatin antibodies, low C3 and C4, positive ribonucleoprotein antibodies Myeloperoxidase , anti-GBM antibodies are negative. ASO titer was elevated Urine protein to creatinine ratio was 5.6 g; 11 gm this admission Urinalysis shows too numerous to count RBCs and WBCs Urine culture is negative to date  Immunology profile suggests that patient has active lupus nephritis. Given iv Solumedrol 750 mg daily iv x 3 (1/4-1/6), started on prednisone 60 mg daily 1/7 Kidney biopsy done on 10/08/15. About 3 cm post procedure hematoma Lupus nephritis confirmed on renal biopsy, awaiting final result.   Plan:  [facility] nephro pathology did call us with the preliminary result yesterday. The patient does appear to have lupus nephritis. There is significant activity on the biopsy specimen. Unfortunately the patient does not have health care coverage therefore it would be difficult to obtain mycophenolate for the patient. Therefore we discussed treatment with cyclophosphamide at the cancer center. We discussed risks, benefits, and alternatives to cyclophosphamide. Of note the patient has history of bilateral tubal ligation and is not planning on having any further children. Therefore we will proceed with cyclophosphamide at this time. We will give cyclophosphamide 500 mg/m as she has significant renal insufficiency. We may consider increasing the dosage if her renal function improves. In addition we will maintain the patient on prednisone 60 mg by mouth daily.  I would also like to start her on an ARB but we will hold off for  now.        LOS: 7 LATEEF, MUNSOOR 1/9/201810:54 AM      Electronically signed by Anthonette Legato, MD at 10/11/2016 11:12 AM      ED to Hosp-Admission (Discharged) on 10/04/2016        Detailed Report

## 2017-01-17 NOTE — Patient Instructions (Addendum)
  Your procedure is scheduled on: 01-23-17 [facility]) Report to Same Day Surgery 2nd floor medical mall [facility] Entrance-take elevator on left to 2nd floor.  Check in with surgery information desk.) To find out your arrival time please call [phone] between 1PM - 3PM on 01-20-17 (FRIDAY)  Remember: Instructions that are not followed completely may result in serious medical risk, up to and including death, or upon the discretion of your surgeon and anesthesiologist your surgery may need to be rescheduled.    _x___ 1. Do not eat food or drink liquids after midnight. No gum chewing or hard candies.     __x__ 2. No Alcohol for 24 hours before or after surgery.   __x__3. No Smoking for 24 prior to surgery.   ____  4. Bring all medications with you on the day of surgery if instructed.    __x__ 5. Notify your doctor if there is any change in your medical condition     (cold, fever, infections).     Do not wear jewelry, make-up, hairpins, clips or nail polish.  Do not wear lotions, powders, or perfumes. You may wear deodorant.  Do not shave 48 hours prior to surgery. Men may shave face and neck.  Do not bring valuables to the hospital.    [facility] is not responsible for any belongings or valuables.               Contacts, dentures or bridgework may not be worn into surgery.  Leave your suitcase in the car. After surgery it may be brought to your room.  For patients admitted to the hospital, discharge time is determined by your  treatment team.   Patients discharged the day of surgery will not be allowed to drive home.  You will need someone to drive you home and stay with you the night of your procedure.    Please read over the following fact sheets that you were given:   [facility] Preparing for Surgery and or MRSA Information   _x___ Take anti-hypertensive (unless it includes a diuretic), cardiac, seizure, asthma, anti-reflux and psychiatric medicines WITH A SMALL SIP OF WATER.  These include:  1. PREDNISONE  2.  3.  4.  5.  6.  ____Fleets enema or Magnesium Citrate as directed.   _x___ Use CHG Soap or sage wipes as directed on instruction sheet   ____ Use inhalers on the day of surgery and bring to hospital day of surgery  ____ Stop Metformin and Janumet 2 days prior to surgery.    ____ Take 1/2 of usual insulin dose the night before surgery and none on the morning surgery.   ____ Follow recommendations from Cardiologist, Pulmonologist or PCP regarding stopping Aspirin, Coumadin, Pllavix ,Eliquis, Effient, or Pradaxa, and Pletal.  X____Stop Anti-inflammatories such as Advil, Aleve, Ibuprofen, Motrin, Naproxen, Naprosyn, Goodies powders or aspirin products NOW- OK to take Tylenol    ____ Stop supplements until after surgery.     ____ Bring C-Pap to the hospital.

## 2017-01-18 ENCOUNTER — Inpatient Hospital Stay: Admission: RE | Admit: 2017-01-18 | Payer: Medicaid Other | Source: Ambulatory Visit

## 2017-01-19 ENCOUNTER — Encounter
Admission: RE | Admit: 2017-01-19 | Discharge: 2017-01-19 | Disposition: A | Discharge status: Home / Self Care | Payer: Medicaid Other | Source: Ambulatory Visit | Attending: Anesthesiology | Admitting: Anesthesiology

## 2017-01-19 ENCOUNTER — Encounter: Admission: RE | Admit: 2017-01-19 | Payer: Medicaid Other | Source: Ambulatory Visit | Admitting: *Deleted

## 2017-01-19 DIAGNOSIS — Z01812 Encounter for preprocedural laboratory examination: Secondary | ICD-10-CM | POA: Diagnosis present

## 2017-01-19 LAB — URINALYSIS, ROUTINE W REFLEX MICROSCOPIC
Bilirubin Urine: NEGATIVE
GLUCOSE, UA: NEGATIVE mg/dL
KETONES UR: NEGATIVE mg/dL
NITRITE: POSITIVE — AB
PH: 7 (ref 5.0–8.0)
PROTEIN: 100 mg/dL — AB
Specific Gravity, Urine: 1.02 (ref 1.005–1.030)

## 2017-01-19 LAB — BASIC METABOLIC PANEL
Anion gap: 8 (ref 5–15)
BUN: 13 mg/dL (ref 6–20)
CALCIUM: 8.6 mg/dL — AB (ref 8.9–10.3)
CO2: 22 mmol/L (ref 22–32)
Chloride: 108 mmol/L (ref 101–111)
Creatinine, Ser: 1.14 mg/dL — ABNORMAL HIGH (ref 0.44–1.00)
GFR calc Af Amer: >60 mL/min (ref >60)
GLUCOSE: 105 mg/dL — AB (ref 65–99)
Potassium: 3 mmol/L — ABNORMAL LOW (ref 3.5–5.1)
Sodium: 138 mmol/L (ref 135–145)

## 2017-01-19 LAB — CBC
HCT: 33.9 % — ABNORMAL LOW (ref 35.0–47.0)
HEMOGLOBIN: 11.1 g/dL — AB (ref 12.0–16.0)
MCH: 30.4 pg (ref 26.0–34.0)
MCHC: 32.9 g/dL (ref 32.0–36.0)
MCV: 92.4 fL (ref 80.0–100.0)
PLATELETS: 232 10*3/uL (ref 150–440)
RBC: 3.67 MIL/uL — ABNORMAL LOW (ref 3.80–5.20)
RDW: 15.2 % — ABNORMAL HIGH (ref 11.5–14.5)
WBC: 7.5 10*3/uL (ref 3.6–11.0)

## 2017-01-19 LAB — PROTIME-INR
INR: 0.95
Prothrombin Time: 12.7 seconds (ref 11.4–15.2)

## 2017-01-19 LAB — APTT: APTT: 28 s (ref 24–36)

## 2017-01-20 ENCOUNTER — Other Ambulatory Visit: Payer: Self-pay | Admitting: Radiology

## 2017-01-20 ENCOUNTER — Telehealth: Payer: Self-pay | Admitting: Radiology

## 2017-01-20 DIAGNOSIS — R319 Hematuria, unspecified: Principal | ICD-10-CM

## 2017-01-20 DIAGNOSIS — N390 Urinary tract infection, site not specified: Secondary | ICD-10-CM

## 2017-01-20 LAB — URINE CULTURE

## 2017-01-20 MED ORDER — SULFAMETHOXAZOLE-TRIMETHOPRIM 800-160 MG PO TABS: 1.0000 | ORAL_TABLET | Freq: Two times a day (BID) | ORAL | 0 refills | Status: DC

## 2017-01-20 NOTE — Pre-Procedure Instructions (Signed)
CALLED AMY AT DR Audree Bane TO MAKE SURE THAT SHE HAD SEEN URINE CULTURE RESULTS THAT SHOWED CONTAMINATION AND MAY NEED TO RECOLLECT- AMY SAID DR Erlene Quan DID SEE RESULTS AND SHE STARTED PT ON ANTIBIOTIC

## 2017-01-20 NOTE — Telephone Encounter (Signed)
-----   Message from [name], MD sent at 01/20/2017  7:50 AM EDT ----- Chrystal needs to be started on Bactrim DS bid x 7 days starting TODAY in order to have surgery next week.  Please let me know by the end of the day if you are unable to reach her.    [name], MD

## 2017-01-20 NOTE — Telephone Encounter (Signed)
Notified pt of ua suspicious for infection & script sent to pharmacy. Advised pt to start medication immediately in order for surgery to proceed as planned. Also advised pt per Dr Erlene Quan to eat 3 bananas the evening prior to surgery due to low potassium level. Pt voices understanding.

## 2017-01-20 NOTE — Pre-Procedure Instructions (Signed)
Urine culture results sent to Dr. Erlene Quan for review.

## 2017-01-20 NOTE — Pre-Procedure Instructions (Addendum)
SPOKE WITH AMY YESTERDAY AT DR Cherrie Gauze OFFICE ABOUT PT WITH K+ 3.0- AMY SAID THAT DR Erlene Quan DOES NOT SUPPLEMENT PT WITH K+ BUT THAT SHE INSTRUCTS PT TO EAT 3 BANANNAS THE NIGHT BEFORE SURGERY-FAXED THESE RESULTS ALONG WITH ALL OTHER LABWORK TO DR Audree Bane OFFICE WITH FAX CONFIRMATION RECEIVED

## 2017-01-22 MED ORDER — CIPROFLOXACIN IN D5W 400 MG/200ML IV SOLN: 400.0000 mg | INTRAVENOUS | Status: DC

## 2017-01-23 ENCOUNTER — Encounter: Payer: Self-pay | Admitting: Anesthesiology

## 2017-01-23 ENCOUNTER — Ambulatory Visit
Admission: RE | Admit: 2017-01-23 | Discharge: 2017-01-23 | Disposition: A | Discharge status: Home / Self Care | Payer: Medicaid Other | Source: Ambulatory Visit | Attending: Urology | Admitting: Urology

## 2017-01-23 ENCOUNTER — Encounter: Payer: Self-pay | Admitting: *Deleted

## 2017-01-23 ENCOUNTER — Encounter
Admission: RE | Disposition: A | Discharge status: Home / Self Care | Payer: Self-pay | Source: Ambulatory Visit | Attending: Urology

## 2017-01-23 ENCOUNTER — Observation Stay
Admission: RE | Admit: 2017-01-23 | Discharge: 2017-01-25 | Disposition: A | Discharge status: Home / Self Care | Payer: Medicaid Other | Source: Ambulatory Visit | Attending: Urology | Admitting: Urology

## 2017-01-23 DIAGNOSIS — F17210 Nicotine dependence, cigarettes, uncomplicated: Secondary | ICD-10-CM | POA: Insufficient documentation

## 2017-01-23 DIAGNOSIS — Z880 Allergy status to penicillin: Secondary | ICD-10-CM | POA: Insufficient documentation

## 2017-01-23 DIAGNOSIS — M3214 Glomerular disease in systemic lupus erythematosus: Secondary | ICD-10-CM | POA: Insufficient documentation

## 2017-01-23 DIAGNOSIS — I10 Essential (primary) hypertension: Secondary | ICD-10-CM | POA: Insufficient documentation

## 2017-01-23 DIAGNOSIS — N200 Calculus of kidney: Principal | ICD-10-CM

## 2017-01-23 DIAGNOSIS — Z79899 Other long term (current) drug therapy: Secondary | ICD-10-CM | POA: Insufficient documentation

## 2017-01-23 DIAGNOSIS — Z7952 Long term (current) use of systemic steroids: Secondary | ICD-10-CM | POA: Insufficient documentation

## 2017-01-23 DIAGNOSIS — N390 Urinary tract infection, site not specified: Secondary | ICD-10-CM | POA: Insufficient documentation

## 2017-01-23 DIAGNOSIS — Z419 Encounter for procedure for purposes other than remedying health state, unspecified: Secondary | ICD-10-CM

## 2017-01-23 DIAGNOSIS — N2889 Other specified disorders of kidney and ureter: Secondary | ICD-10-CM | POA: Insufficient documentation

## 2017-01-23 HISTORY — PX: IR NEPHROSTOMY PLACEMENT RIGHT: IMG6064

## 2017-01-23 LAB — URINE DRUG SCREEN, QUALITATIVE (ARMC ONLY)
Amphetamines, Ur Screen: NOT DETECTED
Barbiturates, Ur Screen: NOT DETECTED
Benzodiazepine, Ur Scrn: NOT DETECTED
CANNABINOID 50 NG, UR ~~LOC~~: POSITIVE — AB
Cocaine Metabolite,Ur ~~LOC~~: NOT DETECTED
MDMA (ECSTASY) UR SCREEN: NOT DETECTED
Methadone Scn, Ur: NOT DETECTED
Opiate, Ur Screen: NOT DETECTED
PHENCYCLIDINE (PCP) UR S: NOT DETECTED
Tricyclic, Ur Screen: NOT DETECTED

## 2017-01-23 LAB — BASIC METABOLIC PANEL
ANION GAP: 6 (ref 5–15)
BUN: 26 mg/dL — AB (ref 6–20)
CALCIUM: 9 mg/dL (ref 8.9–10.3)
CO2: 25 mmol/L (ref 22–32)
Chloride: 107 mmol/L (ref 101–111)
Creatinine, Ser: 1.07 mg/dL — ABNORMAL HIGH (ref 0.44–1.00)
GFR calc Af Amer: >60 mL/min (ref >60)
GLUCOSE: 87 mg/dL (ref 65–99)
Potassium: 4.2 mmol/L (ref 3.5–5.1)
SODIUM: 138 mmol/L (ref 135–145)

## 2017-01-23 LAB — CBC
HCT: 34.6 % — ABNORMAL LOW (ref 35.0–47.0)
HEMOGLOBIN: 11.3 g/dL — AB (ref 12.0–16.0)
MCH: 30.1 pg (ref 26.0–34.0)
MCHC: 32.8 g/dL (ref 32.0–36.0)
MCV: 91.8 fL (ref 80.0–100.0)
Platelets: 225 10*3/uL (ref 150–440)
RBC: 3.77 MIL/uL — ABNORMAL LOW (ref 3.80–5.20)
RDW: 15.3 % — AB (ref 11.5–14.5)
WBC: 9 10*3/uL (ref 3.6–11.0)

## 2017-01-23 LAB — PROTIME-INR
INR: 0.85
Prothrombin Time: 11.6 seconds (ref 11.4–15.2)

## 2017-01-23 LAB — APTT: APTT: 27 s (ref 24–36)

## 2017-01-23 LAB — PREGNANCY, URINE: Preg Test, Ur: NEGATIVE

## 2017-01-23 LAB — POCT PREGNANCY, URINE: PREG TEST UR: NEGATIVE

## 2017-01-23 SURGERY — NEPHROLITHOTOMY PERCUTANEOUS
Anesthesia: Choice | Laterality: Right

## 2017-01-23 MED ORDER — CIPROFLOXACIN IN D5W 400 MG/200ML IV SOLN
INTRAVENOUS | Status: AC
  Filled 2017-01-23: qty 200

## 2017-01-23 MED ORDER — MORPHINE SULFATE (PF) 4 MG/ML IV SOLN
2.0000 mg | Freq: Once | INTRAVENOUS | Status: AC
  Administered 2017-01-23: 2 mg via INTRAVENOUS

## 2017-01-23 MED ORDER — ONDANSETRON HCL 4 MG/2ML IJ SOLN
INTRAMUSCULAR | Status: AC
  Administered 2017-01-23: 4 mg via INTRAVENOUS
  Filled 2017-01-23: qty 2

## 2017-01-23 MED ORDER — ACETAMINOPHEN 325 MG PO TABS
650.0000 mg | ORAL_TABLET | ORAL | Status: DC | PRN
  Administered 2017-01-23: 650 mg via ORAL
  Filled 2017-01-23: qty 2

## 2017-01-23 MED ORDER — DEXTROSE 5 % IV SOLN
1.0000 g | Freq: Every day | INTRAVENOUS | Status: DC
  Administered 2017-01-23 – 2017-01-25 (×3): 1 g via INTRAVENOUS
  Filled 2017-01-23 (×4): qty 10

## 2017-01-23 MED ORDER — ONDANSETRON HCL 4 MG/2ML IJ SOLN
4.0000 mg | Freq: Four times a day (QID) | INTRAMUSCULAR | Status: DC | PRN
  Administered 2017-01-23 – 2017-01-24 (×2): 4 mg via INTRAVENOUS

## 2017-01-23 MED ORDER — SODIUM CHLORIDE 0.9 % IV SOLN
INTRAVENOUS | Status: DC
  Administered 2017-01-23: 07:00:00 via INTRAVENOUS

## 2017-01-23 MED ORDER — SODIUM CHLORIDE 0.9 % IV SOLN
INTRAVENOUS | Status: DC
  Administered 2017-01-23 – 2017-01-24 (×3): via INTRAVENOUS

## 2017-01-23 MED ORDER — ONDANSETRON HCL 4 MG/2ML IJ SOLN: 4.0000 mg | INTRAMUSCULAR | Status: DC | PRN

## 2017-01-23 MED ORDER — MORPHINE SULFATE (PF) 2 MG/ML IV SOLN
2.0000 mg | INTRAVENOUS | Status: DC | PRN
  Administered 2017-01-23 – 2017-01-24 (×4): 2 mg via INTRAVENOUS
  Administered 2017-01-25: 4 mg via INTRAVENOUS
  Filled 2017-01-23 (×3): qty 1
  Filled 2017-01-23: qty 2
  Filled 2017-01-23: qty 1

## 2017-01-23 MED ORDER — FAMOTIDINE 20 MG PO TABS
ORAL_TABLET | ORAL | Status: AC
  Administered 2017-01-23: 20 mg via ORAL
  Filled 2017-01-23: qty 1

## 2017-01-23 MED ORDER — IOPAMIDOL (ISOVUE-300) INJECTION 61%
10.0000 mL | Freq: Once | INTRAVENOUS | Status: AC | PRN
  Administered 2017-01-23: 10:00:00 10 mL via INTRAVENOUS

## 2017-01-23 MED ORDER — DOCUSATE SODIUM 100 MG PO CAPS
100.0000 mg | ORAL_CAPSULE | Freq: Two times a day (BID) | ORAL | Status: DC
  Administered 2017-01-23 – 2017-01-25 (×2): 100 mg via ORAL
  Filled 2017-01-23 (×3): qty 1

## 2017-01-23 MED ORDER — SODIUM CHLORIDE 0.9 % IV SOLN: INTRAVENOUS | Status: DC

## 2017-01-23 MED ORDER — OXYBUTYNIN CHLORIDE 5 MG PO TABS: 5.0000 mg | ORAL_TABLET | Freq: Three times a day (TID) | ORAL | Status: DC | PRN

## 2017-01-23 MED ORDER — MIDAZOLAM HCL 2 MG/2ML IJ SOLN
INTRAMUSCULAR | Status: AC | PRN
  Administered 2017-01-23 (×5): 1 mg via INTRAVENOUS

## 2017-01-23 MED ORDER — FENTANYL CITRATE (PF) 100 MCG/2ML IJ SOLN
INTRAMUSCULAR | Status: AC | PRN
  Administered 2017-01-23 (×3): 25 ug via INTRAVENOUS
  Administered 2017-01-23 (×2): 50 ug via INTRAVENOUS

## 2017-01-23 MED ORDER — DIPHENHYDRAMINE HCL 50 MG/ML IJ SOLN: 12.5000 mg | Freq: Four times a day (QID) | INTRAMUSCULAR | Status: DC | PRN

## 2017-01-23 MED ORDER — PREDNISONE 10 MG PO TABS
10.0000 mg | ORAL_TABLET | Freq: Every day | ORAL | Status: DC
  Administered 2017-01-23 – 2017-01-25 (×3): 10 mg via ORAL
  Filled 2017-01-23 (×3): qty 1

## 2017-01-23 MED ORDER — BELLADONNA ALKALOIDS-OPIUM 16.2-60 MG RE SUPP
1.0000 | Freq: Four times a day (QID) | RECTAL | Status: DC | PRN
  Filled 2017-01-23: qty 1

## 2017-01-23 MED ORDER — FAMOTIDINE 20 MG PO TABS
20.0000 mg | ORAL_TABLET | Freq: Once | ORAL | Status: AC
  Administered 2017-01-23: 20 mg via ORAL

## 2017-01-23 MED ORDER — HYDROXYCHLOROQUINE SULFATE 200 MG PO TABS
200.0000 mg | ORAL_TABLET | Freq: Every evening | ORAL | Status: DC
  Administered 2017-01-23 – 2017-01-24 (×2): 200 mg via ORAL
  Filled 2017-01-23 (×2): qty 1

## 2017-01-23 MED ORDER — MORPHINE SULFATE (PF) 2 MG/ML IV SOLN
INTRAVENOUS | Status: AC
  Filled 2017-01-23: qty 1

## 2017-01-23 MED ORDER — CIPROFLOXACIN IN D5W 400 MG/200ML IV SOLN: 400.0000 mg | Freq: Once | INTRAVENOUS | Status: DC

## 2017-01-23 MED ORDER — OXYCODONE-ACETAMINOPHEN 5-325 MG PO TABS
1.0000 | ORAL_TABLET | ORAL | Status: DC | PRN
  Administered 2017-01-23 – 2017-01-25 (×4): 1 via ORAL
  Filled 2017-01-23 (×4): qty 1

## 2017-01-23 MED ORDER — SODIUM CHLORIDE FLUSH 0.9 % IV SOLN
INTRAVENOUS | Status: AC
  Filled 2017-01-23: qty 10

## 2017-01-23 MED ORDER — LIDOCAINE HCL (PF) 1 % IJ SOLN
INTRAMUSCULAR | Status: AC
  Filled 2017-01-23: qty 30

## 2017-01-23 MED ORDER — DIPHENHYDRAMINE HCL 12.5 MG/5ML PO ELIX: 12.5000 mg | ORAL_SOLUTION | Freq: Four times a day (QID) | ORAL | Status: DC | PRN

## 2017-01-23 MED ORDER — ZOLPIDEM TARTRATE 5 MG PO TABS: 5.0000 mg | ORAL_TABLET | Freq: Every evening | ORAL | Status: DC | PRN

## 2017-01-23 SURGICAL SUPPLY — 66 items
ADAPTER IRRIG TUBE 2 SPIKE SOL (ADAPTER) ×8 IMPLANT
ADAPTER SCOPE UROLOK II (MISCELLANEOUS) ×4 IMPLANT
BAG URO DRAIN 2000ML W/SPOUT (MISCELLANEOUS) ×4 IMPLANT
BALLN NEPHROMAX 30X10X12 (MISCELLANEOUS) ×4
BALLOON NEPHROMAX 30X10X12 (MISCELLANEOUS) ×2 IMPLANT
BASKET ZERO TIP 1.9FR (BASKET) IMPLANT
BLADE SURG 15 STRL LF DISP TIS (BLADE) ×2 IMPLANT
BLADE SURG 15 STRL SS (BLADE) ×2
CATH COUNCIL 22FR (CATHETERS) IMPLANT
CATH FOLEY 2W COUNCIL 20FR 5CC (CATHETERS) IMPLANT
CATH FOLEY 2W COUNCIL 5CC 18FR (CATHETERS) IMPLANT
CATH STENT KAYE NEPHR TAMP (CATHETERS) IMPLANT
CATH TRAY 16F METER LATEX (MISCELLANEOUS) ×4 IMPLANT
CATH URETL 5X70 OPEN END (CATHETERS) ×4 IMPLANT
CATH/STENT KAYE NEPHR TAMP (CATHETERS)
CHLORAPREP W/TINT 26ML (MISCELLANEOUS) ×4 IMPLANT
CNTNR SPEC 2.5X3XGRAD LEK (MISCELLANEOUS) ×4
CONRAY 43 FOR UROLOGY 50M (MISCELLANEOUS) ×8 IMPLANT
CONT SPEC 4OZ STER OR WHT (MISCELLANEOUS) ×4
CONTAINER SPEC 2.5X3XGRAD LEK (MISCELLANEOUS) ×4 IMPLANT
DRAPE C-ARM XRAY 36X54 (DRAPES) ×4 IMPLANT
DRAPE SHEET LG 3/4 BI-LAMINATE (DRAPES) ×4 IMPLANT
DRAPE SURG 17X11 SM STRL (DRAPES) ×16 IMPLANT
GAUZE SPONGE 4X4 12PLY STRL (GAUZE/BANDAGES/DRESSINGS) ×4 IMPLANT
GLIDEWIRE STIFF .35X180X3 HYDR (WIRE) ×4 IMPLANT
GLOVE BIO SURGEON STRL SZ 6.5 (GLOVE) ×3 IMPLANT
GLOVE BIO SURGEON STRL SZ7 (GLOVE) ×8 IMPLANT
GLOVE BIO SURGEONS STRL SZ 6.5 (GLOVE) ×1
GLOVE BIOGEL PI IND STRL 6.5 (GLOVE) ×4 IMPLANT
GLOVE BIOGEL PI INDICATOR 6.5 (GLOVE) ×4
GOWN STRL REUS W/ TWL LRG LVL3 (GOWN DISPOSABLE) ×4 IMPLANT
GOWN STRL REUS W/TWL LRG LVL3 (GOWN DISPOSABLE) ×4
GUIDEWIRE GREEN .038 145CM (MISCELLANEOUS) IMPLANT
GUIDEWIRE INTRO SET STRAIGHT (WIRE) ×4 IMPLANT
GUIDEWIRE STR ZIPWIRE 035X150 (MISCELLANEOUS) IMPLANT
GUIDEWIRE SUPER STIFF .035X180 (WIRE) IMPLANT
HOLDER FOLEY CATH W/STRAP (MISCELLANEOUS) ×4 IMPLANT
INTRODUCER DILATOR DOUBLE (INTRODUCER) IMPLANT
MANIFOLD NEPTUNE II (INSTRUMENTS) ×4 IMPLANT
MAT BLUE FLOOR 46X72 FLO (MISCELLANEOUS) ×8 IMPLANT
NDL FASCIA INCISION 18GA (NEEDLE) ×4 IMPLANT
PACK BASIN MINOR ARMC (MISCELLANEOUS) ×4 IMPLANT
PAD ABD DERMACEA PRESS 5X9 (GAUZE/BANDAGES/DRESSINGS) ×4 IMPLANT
PROBE CYBERWAND SET (MISCELLANEOUS) ×4 IMPLANT
SENSORWIRE 0.038 NOT ANGLED (WIRE) ×4
SET IRRIG Y TYPE TUR BLADDER L (SET/KITS/TRAYS/PACK) ×4 IMPLANT
SET IRRIGATING DISP (SET/KITS/TRAYS/PACK) ×4 IMPLANT
SHEET NEURO XL SOL CTL (MISCELLANEOUS) ×4 IMPLANT
SOL .9 NS 3000ML IRR  AL (IV SOLUTION) ×8
SOL .9 NS 3000ML IRR UROMATIC (IV SOLUTION) ×8 IMPLANT
SPONGE DRAIN TRACH 4X4 STRL 2S (GAUZE/BANDAGES/DRESSINGS) ×4 IMPLANT
STENT URET 6FRX24 CONTOUR (STENTS) IMPLANT
STENT URET 6FRX26 CONTOUR (STENTS) IMPLANT
STRAP SAFETY BODY (MISCELLANEOUS) ×8 IMPLANT
SUT SILK 0 SH 30 (SUTURE) ×4 IMPLANT
SYR 20CC LL (SYRINGE) ×4 IMPLANT
SYR 30ML LL (SYRINGE) ×4 IMPLANT
SYRINGE 10CC LL (SYRINGE) ×4 IMPLANT
SYRINGE IRR TOOMEY STRL 70CC (SYRINGE) ×4 IMPLANT
TAPE CLOTH 1X10 WHT NS LF (TAPE) ×4 IMPLANT
TAPE MICROFOAM 4IN (TAPE) ×4 IMPLANT
TRAP SPECIMEN MUCOUS 40CC (MISCELLANEOUS) IMPLANT
TUBING CONNECTING 10 (TUBING) ×3 IMPLANT
TUBING CONNECTING 10' (TUBING) ×1
WATER STERILE IRR 1000ML POUR (IV SOLUTION) ×4 IMPLANT
WIRE SENSOR 0.038 NOT ANGLED (WIRE) ×2 IMPLANT

## 2017-01-23 NOTE — Anesthesia Preprocedure Evaluation (Deleted)
Anesthesia Evaluation  Patient identified by MRN, date of birth, ID band Patient awake    Reviewed: Allergy & Precautions, H&P , NPO status , Patient's Chart, lab work & pertinent test results, reviewed documented beta blocker date and time   History of Anesthesia Complications Negative for: history of anesthetic complications  Airway Mallampati: III  TM Distance: >3 FB Neck ROM: full    Dental   Pulmonary shortness of breath and with exertion, neg sleep apnea, neg COPD, neg recent URI, Current Smoker,           Cardiovascular Exercise Tolerance: Good hypertension, (-) angina(-) CAD, (-) Past MI, (-) Cardiac Stents and (-) CABG (-) dysrhythmias (-) Valvular Problems/Murmurs     Neuro/Psych negative neurological ROS  negative psych ROS   GI/Hepatic negative GI ROS, Neg liver ROS,   Endo/Other  negative endocrine ROS  Renal/GU Renal disease (kidney stones)  negative genitourinary   Musculoskeletal   Abdominal   Peds  Hematology  (+) Blood dyscrasia, anemia ,   Anesthesia Other Findings Past Medical History: No date: Acute renal failure (ARF) ([facility]) No date: Anemia     Comment: H/O WITH PREGNANCY No date: History of kidney stones No date: Hypertension     Comment: OFF BP MEDS X 3 WEEKS DUE TO BP CONTROL 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[place])     Comment: DX DURING HOSPITAL ADMISSION No date: Rash No date: Staghorn calculus 10/2016: 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[facility])     Comment: DX DURING HOSPITAL ADMISSION   Reproductive/Obstetrics negative OB ROS                             Anesthesia Physical Anesthesia Plan  ASA: II  Anesthesia Plan: General   Post-op Pain Management:    Induction:   Airway Management Planned:   Additional Equipment:   Intra-op Plan:   Post-operative Plan:   Informed Consent: I have reviewed the patients History and Physical, chart, labs and discussed the procedure  including the risks, benefits and alternatives for the proposed anesthesia with the patient or authorized representative who has indicated his/her understanding and acceptance.   Dental Advisory Given  Plan Discussed with: Anesthesiologist, CRNA and Surgeon  Anesthesia Plan Comments:         Anesthesia Quick Evaluation

## 2017-01-23 NOTE — H&P (Signed)
12/09/2016  --> patient seen and examined on 01/23/17.  Preop urine frankly positive with mixed flora (chronic colonization).  Ordered bactrim for 3 days but failed to pick up script.  As such, will precede today with nephrostomy tube placement and admit for IV abx x 24 hours.  If remains afebrile, will consider PCNL tomorrow.     Patricia Maldonado [birth year] [id number]  Referring provider: No referring provider defined for this encounter.      Chief Complaint  Patient presents with  . Follow-up    6wk    HPI: 36 year old female who returns to the office today to discuss management of her right staghorn calculus. 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  Her current medical regimen includes prednisone 40 mg, amlodipine 5 mg, Cytoxan.  She is unsure how long she will be on these medications.  She has been tolerating them fairly well. Initially, she had difficulty following up to her appointments but has since gotten Medicaid and transportation to all medical appointments.  She does have occasional intermittent right flank pain. This comes and goes. No gross hematuria. No fevers or chills.  No dysuria.    Her overall renal function is improving, most recent creatinine 1.6 to down from as high as 4.27 during her previous admission.  PMH:     Past Medical History:  Diagnosis Date  . Acute renal failure (ARF) ([place])   . Rash   . Staghorn calculus     Surgical History:      Past Surgical History:  Procedure Laterality Date  . TUBAL LIGATION      Home Medications:      Allergies as of 12/09/2016      Reactions   Penicillins    Yeast infection               Medication List           Accurate as of 12/09/16  3:21 PM. Always use your most recent med list.           acetaminophen 500 MG tablet Commonly known as:  TYLENOL Take 1-2 tablets (500-1,000 mg total) by mouth every 6  (six) hours as needed for mild pain.   amLODipine 5 MG tablet Commonly known as:  NORVASC Take 5 mg by mouth daily.   CYTOXAN LYOPHILIZED IV Inject into the vein.   hydroxychloroquine 200 MG tablet Commonly known as:  PLAQUENIL Take 1 tablet (200 mg total) by mouth daily.   metroNIDAZOLE 0.75 % cream Commonly known as:  METROCREAM Apply topically 2 (two) times daily.   predniSONE 20 MG tablet Commonly known as:  DELTASONE Take 3 tablets (60 mg total) by mouth daily with breakfast.   triamcinolone cream 0.1 % Commonly known as:  KENALOG Apply topically 2 (two) times daily.       Allergies:       Allergies  Allergen Reactions  . Penicillins     Yeast infection    Family History:      Family History  Problem Relation Age of Onset  . Thyroid disease Mother   . Thyroid disease Father   . Diabetes Father     Social History:  reports that she has been smoking Cigarettes.  She has a 15.00 pack-year smoking history. She has never used smokeless tobacco. She reports that she uses drugs, including Marijuana. She reports that she does not drink alcohol.  ROS: UROLOGY Frequent Urination?:  No Hard to postpone urination?: No Burning/pain with urination?: No Get up at night to urinate?: No Leakage of urine?: No Urine stream starts and stops?: No Trouble starting stream?: No Do you have to strain to urinate?: No Blood in urine?: No Urinary tract infection?: No Sexually transmitted disease?: No Injury to kidneys or bladder?: No Painful intercourse?: No Weak stream?: No Currently pregnant?: No Vaginal bleeding?: No Last menstrual period?: n  Gastrointestinal Nausea?: No Vomiting?: No Indigestion/heartburn?: No Diarrhea?: No Constipation?: No  Constitutional Fever: No Night sweats?: No Weight loss?: No Fatigue?: No  Skin Skin rash/lesions?: No Itching?: No  Eyes Blurred vision?: No Double vision?: No  Ears/Nose/Throat Sore  throat?: No Sinus problems?: No  Hematologic/Lymphatic Swollen glands?: No Easy bruising?: No  Cardiovascular Leg swelling?: No Chest pain?: No  Respiratory Cough?: No Shortness of breath?: No  Endocrine Excessive thirst?: No  Musculoskeletal Back pain?: No Joint pain?: No  Neurological Headaches?: Yes Dizziness?: Yes  Psychologic Depression?: No Anxiety?: No  Physical Exam: BP 124/72   Pulse (!) 117   Ht 5\' 2"  (1.575 m)   Wt 100 lb (45.4 kg)   BMI 18.29 kg/m   Constitutional:  Alert and oriented, No acute distress. HEENT: [place] AT, moist mucus membranes.  Trachea midline, no masses.  Facial fullness appreciated. Cardiovascular: No clubbing, cyanosis, or edema. Respiratory: Normal respiratory effort, no increased work of breathing. GI: Abdomen is soft, nontender, nondistended, no abdominal masses GU: No CVA tenderness.  Skin: Mild papillary facial rash with hypopigmentation. Neurologic: Grossly intact, no focal deficits, moving all 4 extremities. Psychiatric: Normal mood and affect.  Laboratory Data: RecentLabs       Lab Results  Component Value Date   WBC 10.3 12/05/2016   HGB 10.8 (L) 12/05/2016   HCT 32.5 (L) 12/05/2016   MCV 94.6 12/05/2016   PLT 288 12/05/2016      RecentLabs       Lab Results  Component Value Date   CREATININE 1.62 (H) 12/05/2016     Urinalysis N/a  Pertinent Imaging: CLINICAL DATA: Intractable vomiting, recently seen in the ER and diagnosed with renal stones. Continues to have emesis and nausea.  EXAM: CT ABDOMEN AND PELVIS WITHOUT CONTRAST  TECHNIQUE: Multidetector CT imaging of the abdomen and pelvis was performed following the standard protocol without IV contrast.  COMPARISON: 08/31/2016  FINDINGS: Lower chest: Nonacute  Hepatobiliary: No focal liver abnormality is seen. No gallstones, gallbladder wall thickening, or biliary dilatation.  Pancreas: Unremarkable  Spleen:  Normal  Adrenals/Urinary Tract: Unremarkable adrenal glands. Punctate nonobstructing calculus in the interpolar left kidney again noted measuring approximately 1 mm. No hydronephrosis on the left. Large staghorn calculus of the right kidney with smaller separate peripheral calcifications in the lower pole are again noted with mild lower pole caliectasis and mild dilatation of the right renal pelvis. Findings are relatively similar to previous study. No ureteral dilatation. 2.1 cm cyst in the upper pole right kidney.  Stomach/Bowel: The stomach is mildly distended with fluid. No small bowel dilatation is noted. Lack of oral contrast limits assessment. No bowel obstruction is seen. Moderate fecal residue within large bowel without obstruction. What is believed to be the appendix is normal.  Vascular/Lymphatic: Normal caliber of the abdominal aorta. Nonpathologic sized retroperitoneal lymph nodes as well small bilateral inguinal nodes.  Reproductive: Uterus and bilateral adnexa are unremarkable.  Other: No abdominal wall hernia or abnormality. No abdominopelvic ascites.  Musculoskeletal: No acute or significant osseous findings.  IMPRESSION: Bilateral renal calculi without  obstructive uropathy, staghorn on the right and punctate interpolar on the left. Lack of IV contrast limits assessment for pyelonephritis should be correlated clinically. Stable 2.1 cm upper pole right renal cyst.  Lack of oral contrast and intra-abdominal fat limits assessment of small and large bowel. No bowel obstruction is identified.   Electronically Signed By:  Royalty M.D. On: 10/04/2016 19:17  CT scan reviewed today again personally today with the patient.  Assessment & Plan:    1. Staghorn calculus Partial right staghorn calculus involving lower and mid pole with some caliectasis but no obstruction. Asymptomatic. I have recommended proceeding PCNL as per previous  discussions. Risks of surgery were reviewed today in detail along with the preoperative, intraoperative, and postoperative courses. All of her questions were answered.  Specifically today, risk of bleeding, infection, damage is running structures, renal loss, need for blood transfusion, most others were discussed.   2. Lupus nephritis ([place]) Her case was discussed with Dr. Holley Raring as well as her rheumatologist Dr. Meda Coffee. Based on her discussion, she will need to continue Cytoxan for at least 6 months As such, we will plan to proceed with PCNL during the 3rd  or4th week of her cycle Prefer to avoid waiting until entire course was completely given partially obstructive nature of the stone and infectious risk  Schedule surgery (right PCNL)

## 2017-01-23 NOTE — Procedures (Signed)
RIGHT antegrade nephroureteral catheter placement x2   No complication No blood loss. See complete dictation in [facility].

## 2017-01-23 NOTE — OR Nursing (Signed)
Patient never picked up her antibiotic pre-op. Dr. Erlene Quan notified. Dr.Brandon has requested patient taken down to specials for tube placement then admit to floor for IV antibiotics.

## 2017-01-24 ENCOUNTER — Observation Stay: Payer: Medicaid Other | Admitting: Anesthesiology

## 2017-01-24 ENCOUNTER — Observation Stay: Payer: Medicaid Other

## 2017-01-24 ENCOUNTER — Ambulatory Visit: Admit: 2017-01-24 | Payer: Medicaid Other | Admitting: Urology

## 2017-01-24 ENCOUNTER — Encounter: Payer: Self-pay | Admitting: Anesthesiology

## 2017-01-24 ENCOUNTER — Encounter
Admission: RE | Disposition: A | Discharge status: Home / Self Care | Payer: Self-pay | Source: Ambulatory Visit | Attending: Urology

## 2017-01-24 DIAGNOSIS — N200 Calculus of kidney: Secondary | ICD-10-CM | POA: Diagnosis not present

## 2017-01-24 HISTORY — PX: NEPHROLITHOTOMY: SHX5134

## 2017-01-24 LAB — CBC
HCT: 30.9 % — ABNORMAL LOW (ref 35.0–47.0)
HEMATOCRIT: 31.2 % — AB (ref 35.0–47.0)
Hemoglobin: 10.3 g/dL — ABNORMAL LOW (ref 12.0–16.0)
Hemoglobin: 9.7 g/dL — ABNORMAL LOW (ref 12.0–16.0)
MCH: 30.8 pg (ref 26.0–34.0)
MCH: 29.8 pg (ref 26.0–34.0)
MCHC: 32.9 g/dL (ref 32.0–36.0)
MCHC: 31.5 g/dL — AB (ref 32.0–36.0)
MCV: 93.5 fL (ref 80.0–100.0)
MCV: 94.7 fL (ref 80.0–100.0)
PLATELETS: 188 10*3/uL (ref 150–440)
Platelets: 193 10*3/uL (ref 150–440)
RBC: 3.34 MIL/uL — ABNORMAL LOW (ref 3.80–5.20)
RBC: 3.26 MIL/uL — ABNORMAL LOW (ref 3.80–5.20)
RDW: 15.1 % — AB (ref 11.5–14.5)
RDW: 15.5 % — ABNORMAL HIGH (ref 11.5–14.5)
WBC: 11.2 10*3/uL — ABNORMAL HIGH (ref 3.6–11.0)
WBC: 18.7 10*3/uL — ABNORMAL HIGH (ref 3.6–11.0)

## 2017-01-24 LAB — SURGICAL PCR SCREEN
MRSA, PCR: NEGATIVE
Staphylococcus aureus: POSITIVE — AB

## 2017-01-24 LAB — BASIC METABOLIC PANEL
ANION GAP: 5 (ref 5–15)
BUN: 13 mg/dL (ref 6–20)
CALCIUM: 8 mg/dL — AB (ref 8.9–10.3)
CO2: 24 mmol/L (ref 22–32)
CREATININE: 1.23 mg/dL — AB (ref 0.44–1.00)
Chloride: 109 mmol/L (ref 101–111)
GFR calc Af Amer: >60 mL/min (ref >60)
GFR calc non Af Amer: 57 mL/min — ABNORMAL LOW (ref >60)
GLUCOSE: 91 mg/dL (ref 65–99)
Potassium: 3.9 mmol/L (ref 3.5–5.1)
Sodium: 138 mmol/L (ref 135–145)

## 2017-01-24 SURGERY — NEPHROLITHOTOMY PERCUTANEOUS
Anesthesia: General | Site: Back | Laterality: Right | Wound class: Clean

## 2017-01-24 MED ORDER — FENTANYL CITRATE (PF) 100 MCG/2ML IJ SOLN
INTRAMUSCULAR | Status: AC
  Filled 2017-01-24: qty 2

## 2017-01-24 MED ORDER — GLYCOPYRROLATE 0.2 MG/ML IJ SOLN
INTRAMUSCULAR | Status: AC
  Filled 2017-01-24: qty 1

## 2017-01-24 MED ORDER — LIDOCAINE HCL (CARDIAC) 20 MG/ML IV SOLN
INTRAVENOUS | Status: DC | PRN
  Administered 2017-01-24: 80 mg via INTRAVENOUS

## 2017-01-24 MED ORDER — SUCCINYLCHOLINE CHLORIDE 20 MG/ML IJ SOLN
INTRAMUSCULAR | Status: DC | PRN
  Administered 2017-01-24: 100 mg via INTRAVENOUS

## 2017-01-24 MED ORDER — DEXAMETHASONE SODIUM PHOSPHATE 10 MG/ML IJ SOLN
INTRAMUSCULAR | Status: AC
  Filled 2017-01-24: qty 1

## 2017-01-24 MED ORDER — SUCCINYLCHOLINE CHLORIDE 20 MG/ML IJ SOLN
INTRAMUSCULAR | Status: AC
  Filled 2017-01-24: qty 1

## 2017-01-24 MED ORDER — SEVOFLURANE IN SOLN
RESPIRATORY_TRACT | Status: AC
  Filled 2017-01-24: qty 250

## 2017-01-24 MED ORDER — NEOSTIGMINE METHYLSULFATE 10 MG/10ML IV SOLN
INTRAVENOUS | Status: DC | PRN
  Administered 2017-01-24: 3 mg via INTRAVENOUS

## 2017-01-24 MED ORDER — FENTANYL CITRATE (PF) 100 MCG/2ML IJ SOLN
INTRAMUSCULAR | Status: DC | PRN
  Administered 2017-01-24: 100 ug via INTRAVENOUS

## 2017-01-24 MED ORDER — FENTANYL CITRATE (PF) 100 MCG/2ML IJ SOLN
INTRAMUSCULAR | Status: AC
  Administered 2017-01-24: 50 ug via INTRAVENOUS
  Filled 2017-01-24: qty 2

## 2017-01-24 MED ORDER — PROPOFOL 500 MG/50ML IV EMUL
INTRAVENOUS | Status: AC
  Filled 2017-01-24: qty 50

## 2017-01-24 MED ORDER — MIDAZOLAM HCL 2 MG/2ML IJ SOLN
INTRAMUSCULAR | Status: AC
  Filled 2017-01-24: qty 2

## 2017-01-24 MED ORDER — MIDAZOLAM HCL 2 MG/2ML IJ SOLN
INTRAMUSCULAR | Status: DC | PRN
  Administered 2017-01-24: 2 mg via INTRAVENOUS

## 2017-01-24 MED ORDER — ONDANSETRON HCL 4 MG/2ML IJ SOLN
INTRAMUSCULAR | Status: AC
  Filled 2017-01-24: qty 2

## 2017-01-24 MED ORDER — OXYCODONE HCL 5 MG/5ML PO SOLN: 5.0000 mg | Freq: Once | ORAL | Status: DC | PRN

## 2017-01-24 MED ORDER — GLYCOPYRROLATE 0.2 MG/ML IJ SOLN
INTRAMUSCULAR | Status: AC
  Filled 2017-01-24: qty 2

## 2017-01-24 MED ORDER — PROPOFOL 10 MG/ML IV BOLUS
INTRAVENOUS | Status: AC
  Filled 2017-01-24: qty 20

## 2017-01-24 MED ORDER — PHENYLEPHRINE HCL 10 MG/ML IJ SOLN
INTRAMUSCULAR | Status: AC
  Filled 2017-01-24: qty 1

## 2017-01-24 MED ORDER — GLYCOPYRROLATE 0.2 MG/ML IJ SOLN
INTRAMUSCULAR | Status: DC | PRN
  Administered 2017-01-24: 0.4 mg via INTRAVENOUS

## 2017-01-24 MED ORDER — NEOSTIGMINE METHYLSULFATE 10 MG/10ML IV SOLN
INTRAVENOUS | Status: AC
  Filled 2017-01-24: qty 1

## 2017-01-24 MED ORDER — MUPIROCIN 2 % EX OINT
1.0000 "application " | TOPICAL_OINTMENT | Freq: Two times a day (BID) | CUTANEOUS | Status: DC
  Administered 2017-01-25: 1 via NASAL
  Filled 2017-01-24: qty 22

## 2017-01-24 MED ORDER — CHLORHEXIDINE GLUCONATE CLOTH 2 % EX PADS: 6.0000 | MEDICATED_PAD | Freq: Every day | CUTANEOUS | Status: DC

## 2017-01-24 MED ORDER — IOTHALAMATE MEGLUMINE 43 % IV SOLN
INTRAVENOUS | Status: DC | PRN
  Administered 2017-01-24: 120 mL

## 2017-01-24 MED ORDER — EPHEDRINE SULFATE 50 MG/ML IJ SOLN
INTRAMUSCULAR | Status: AC
  Filled 2017-01-24: qty 1

## 2017-01-24 MED ORDER — ROCURONIUM BROMIDE 50 MG/5ML IV SOLN
INTRAVENOUS | Status: AC
  Filled 2017-01-24: qty 1

## 2017-01-24 MED ORDER — FENTANYL CITRATE (PF) 100 MCG/2ML IJ SOLN
25.0000 ug | INTRAMUSCULAR | Status: DC | PRN
  Administered 2017-01-24 (×2): 50 ug via INTRAVENOUS

## 2017-01-24 MED ORDER — HYDROCORTISONE NA SUCCINATE PF 100 MG IJ SOLR
INTRAMUSCULAR | Status: DC | PRN
  Administered 2017-01-24: 100 mg via INTRAVENOUS

## 2017-01-24 MED ORDER — ROCURONIUM BROMIDE 100 MG/10ML IV SOLN
INTRAVENOUS | Status: DC | PRN
Start: 2017-01-24 — End: 2017-01-24
  Administered 2017-01-24 (×2): 10 mg via INTRAVENOUS

## 2017-01-24 MED ORDER — PROPOFOL 10 MG/ML IV BOLUS
INTRAVENOUS | Status: DC | PRN
  Administered 2017-01-24: 10 mg via INTRAVENOUS

## 2017-01-24 MED ORDER — LACTATED RINGERS IV SOLN
INTRAVENOUS | Status: DC | PRN
  Administered 2017-01-24: 11:00:00 via INTRAVENOUS

## 2017-01-24 MED ORDER — OXYCODONE HCL 5 MG PO TABS: 5.0000 mg | ORAL_TABLET | Freq: Once | ORAL | Status: DC | PRN

## 2017-01-24 SURGICAL SUPPLY — 67 items
ADAPTER IRRIG TUBE 2 SPIKE SOL (ADAPTER) ×8 IMPLANT
ADAPTER SCOPE UROLOK II (MISCELLANEOUS) ×4 IMPLANT
BAG URO DRAIN 2000ML W/SPOUT (MISCELLANEOUS) ×4 IMPLANT
BALLN NEPHROMAX 30X10X12 (MISCELLANEOUS) ×8
BALLOON NEPHROMAX 30X10X12 (MISCELLANEOUS) ×4 IMPLANT
BASKET ZERO TIP 1.9FR (BASKET) ×4 IMPLANT
BLADE SURG 15 STRL LF DISP TIS (BLADE) ×2 IMPLANT
BLADE SURG 15 STRL SS (BLADE) ×2
CATH COUNCIL 22FR (CATHETERS) IMPLANT
CATH FOLEY 2W COUNCIL 20FR 5CC (CATHETERS) IMPLANT
CATH FOLEY 2W COUNCIL 5CC 18FR (CATHETERS) ×8 IMPLANT
CATH STENT KAYE NEPHR TAMP (CATHETERS) IMPLANT
CATH TRAY 16F METER LATEX (MISCELLANEOUS) ×4 IMPLANT
CATH URETL 5X70 OPEN END (CATHETERS) ×4 IMPLANT
CATH/STENT KAYE NEPHR TAMP (CATHETERS)
CHLORAPREP W/TINT 26ML (MISCELLANEOUS) ×4 IMPLANT
CNTNR SPEC 2.5X3XGRAD LEK (MISCELLANEOUS) ×4
CONRAY 43 FOR UROLOGY 50M (MISCELLANEOUS) ×8 IMPLANT
CONT SPEC 4OZ STER OR WHT (MISCELLANEOUS) ×4
CONTAINER SPEC 2.5X3XGRAD LEK (MISCELLANEOUS) ×4 IMPLANT
DRAPE C-ARM XRAY 36X54 (DRAPES) ×4 IMPLANT
DRAPE SHEET LG 3/4 BI-LAMINATE (DRAPES) ×4 IMPLANT
DRAPE SURG 17X11 SM STRL (DRAPES) ×16 IMPLANT
GAUZE SPONGE 4X4 12PLY STRL (GAUZE/BANDAGES/DRESSINGS) ×4 IMPLANT
GLIDEWIRE STIFF .35X180X3 HYDR (WIRE) ×4 IMPLANT
GLOVE BIO SURGEON STRL SZ 6.5 (GLOVE) ×3 IMPLANT
GLOVE BIO SURGEON STRL SZ7 (GLOVE) ×8 IMPLANT
GLOVE BIO SURGEONS STRL SZ 6.5 (GLOVE) ×1
GLOVE BIOGEL PI IND STRL 6.5 (GLOVE) ×4 IMPLANT
GLOVE BIOGEL PI INDICATOR 6.5 (GLOVE) ×4
GOWN STRL REUS W/ TWL LRG LVL3 (GOWN DISPOSABLE) ×4 IMPLANT
GOWN STRL REUS W/TWL LRG LVL3 (GOWN DISPOSABLE) ×4
GUIDEWIRE GREEN .038 145CM (MISCELLANEOUS) ×8 IMPLANT
GUIDEWIRE INTRO SET STRAIGHT (WIRE) IMPLANT
GUIDEWIRE STR ZIPWIRE 035X150 (MISCELLANEOUS) IMPLANT
GUIDEWIRE SUPER STIFF .035X180 (WIRE) ×4 IMPLANT
HOLDER FOLEY CATH W/STRAP (MISCELLANEOUS) ×4 IMPLANT
INTRODUCER DILATOR DOUBLE (INTRODUCER) IMPLANT
MANIFOLD NEPTUNE II (INSTRUMENTS) ×4 IMPLANT
MAT BLUE FLOOR 46X72 FLO (MISCELLANEOUS) ×8 IMPLANT
NDL FASCIA INCISION 18GA (NEEDLE) ×4 IMPLANT
PACK BASIN MINOR ARMC (MISCELLANEOUS) ×4 IMPLANT
PAD ABD DERMACEA PRESS 5X9 (GAUZE/BANDAGES/DRESSINGS) ×4 IMPLANT
PLUG CATH AND CAP STER (CATHETERS) ×4 IMPLANT
PROBE CYBERWAND SET (MISCELLANEOUS) ×4 IMPLANT
SENSORWIRE 0.038 NOT ANGLED (WIRE) ×8
SET IRRIG Y TYPE TUR BLADDER L (SET/KITS/TRAYS/PACK) ×4 IMPLANT
SET IRRIGATING DISP (SET/KITS/TRAYS/PACK) ×4 IMPLANT
SHEET NEURO XL SOL CTL (MISCELLANEOUS) ×4 IMPLANT
SOL .9 NS 3000ML IRR  AL (IV SOLUTION) ×32
SOL .9 NS 3000ML IRR UROMATIC (IV SOLUTION) ×32 IMPLANT
SPONGE DRAIN TRACH 4X4 STRL 2S (GAUZE/BANDAGES/DRESSINGS) ×4 IMPLANT
STENT URET 6FRX24 CONTOUR (STENTS) ×4 IMPLANT
STENT URET 6FRX26 CONTOUR (STENTS) IMPLANT
STRAP SAFETY BODY (MISCELLANEOUS) ×8 IMPLANT
SUT SILK 0 SH 30 (SUTURE) ×4 IMPLANT
SYR 20CC LL (SYRINGE) ×4 IMPLANT
SYR 30ML LL (SYRINGE) ×4 IMPLANT
SYRINGE 10CC LL (SYRINGE) ×4 IMPLANT
SYRINGE IRR TOOMEY STRL 70CC (SYRINGE) ×4 IMPLANT
TAPE CLOTH 1X10 WHT NS LF (TAPE) ×4 IMPLANT
TAPE MICROFOAM 4IN (TAPE) ×4 IMPLANT
TRAP SPECIMEN MUCOUS 40CC (MISCELLANEOUS) IMPLANT
TUBING CONNECTING 10 (TUBING) ×3 IMPLANT
TUBING CONNECTING 10' (TUBING) ×1
WATER STERILE IRR 1000ML POUR (IV SOLUTION) ×4 IMPLANT
WIRE SENSOR 0.038 NOT ANGLED (WIRE) ×4 IMPLANT

## 2017-01-24 NOTE — Anesthesia Postprocedure Evaluation (Signed)
Anesthesia Post Note  Patient: Patricia Maldonado  Procedure(Maldonado) Performed: Procedure(Maldonado) (LRB): NEPHROLITHOTOMY PERCUTANEOUS (Right)  Patient location during evaluation: PACU Anesthesia Type: General Level of consciousness: awake and alert Pain management: pain level controlled Vital Signs Assessment: post-procedure vital signs reviewed and stable Respiratory status: spontaneous breathing, nonlabored ventilation, respiratory function stable and patient connected to nasal cannula oxygen Cardiovascular status: blood pressure returned to baseline and stable Postop Assessment: no signs of nausea or vomiting Anesthetic complications: no     Last Vitals:  Vitals:   01/24/17 1617 01/24/17 1702  BP: 132/89 113/72  Pulse: 99 98  Resp: 19 16  Temp: 36.7 C 37.1 C    Last Pain:  Vitals:   01/24/17 1702  TempSrc: Oral  PainSc:                  Patricia Maldonado

## 2017-01-24 NOTE — Interval H&P Note (Signed)
History and Physical Interval Note:  01/24/2017 10:43 AM  Patricia Maldonado  has presented today for surgery, with the diagnosis of RIGHT STAGEHORN  The various methods of treatment have been discussed with the patient and family. After consideration of risks, benefits and other options for treatment, the patient has consented to  Procedure(s): NEPHROLITHOTOMY PERCUTANEOUS (N/A) HOLMIUM LASER APPLICATION (N/A) as a surgical intervention .  The patient's history has been reviewed, patient examined, no change in status, stable for surgery.  I have reviewed the patient's chart and labs.  Questions were answered to the patient's satisfaction.    RRR CTAB   [name]

## 2017-01-24 NOTE — Anesthesia Post-op Follow-up Note (Cosign Needed)
Anesthesia QCDR form completed.        

## 2017-01-24 NOTE — Anesthesia Preprocedure Evaluation (Signed)
Anesthesia Evaluation  Patient identified by MRN, date of birth, ID band Patient awake    Reviewed: Allergy & Precautions, H&P , NPO status , Patient's Chart, lab work & pertinent test results  History of Anesthesia Complications Negative for: history of anesthetic complications  Airway Mallampati: III  TM Distance: >3 FB Neck ROM: full    Dental  (+) Poor Dentition, Chipped   Pulmonary neg shortness of breath, Current Smoker,    Pulmonary exam normal breath sounds clear to auscultation       Cardiovascular Exercise Tolerance: Good hypertension, Normal cardiovascular exam Rhythm:regular Rate:Normal     Neuro/Psych negative neurological ROS  negative psych ROS   GI/Hepatic negative GI ROS, Neg liver ROS,   Endo/Other  negative endocrine ROS  Renal/GU ESRFRenal disease     Musculoskeletal   Abdominal   Peds  Hematology negative hematology ROS (+)   Anesthesia Other Findings Past Medical History: No date: Acute renal failure (ARF) ([facility]) No date: Anemia     Comment: H/O WITH PREGNANCY No date: History of kidney stones No date: Hypertension     Comment: OFF BP MEDS X 3 WEEKS DUE TO BP CONTROL 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[place])     Comment: DX DURING HOSPITAL ADMISSION No date: Rash No date: Staghorn calculus 10/2016: 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[place])     Comment: DX DURING HOSPITAL ADMISSION  Past Surgical History: 01/23/2017: IR NEPHROSTOMY PLACEMENT RIGHT No date: TUBAL LIGATION  BMI    Body Mass Index:  29.26 kg/m      Reproductive/Obstetrics negative OB ROS                             Anesthesia Physical Anesthesia Plan  ASA: III  Anesthesia Plan: General ETT   Post-op Pain Management:    Induction: Intravenous  Airway Management Planned: Oral ETT  Additional Equipment:   Intra-op Plan:   Post-operative Plan: Extubation in OR  Informed Consent: I have reviewed the patients  History and Physical, chart, labs and discussed the procedure including the risks, benefits and alternatives for the proposed anesthesia with the patient or authorized representative who has indicated his/her understanding and acceptance.   Dental Advisory Given  Plan Discussed with: Anesthesiologist, CRNA and Surgeon  Anesthesia Plan Comments: (Patient consented for risks of anesthesia including but not limited to:  - adverse reactions to medications - damage to teeth, lips or other oral mucosa - sore throat or hoarseness - Damage to heart, brain, lungs or loss of life  Patient voiced understanding.)        Anesthesia Quick Evaluation

## 2017-01-24 NOTE — Transfer of Care (Signed)
Immediate Anesthesia Transfer of Care Note  Patient: Patricia Maldonado  Procedure(s) Performed: Procedure(s): NEPHROLITHOTOMY PERCUTANEOUS (Right)  Patient Location: PACU  Anesthesia Type:General  Level of Consciousness: awake and alert   Airway & Oxygen Therapy: Patient Spontanous Breathing and Patient connected to face mask oxygen  Post-op Assessment: Report given to RN and Post -op Vital signs reviewed and stable  Post vital signs: Reviewed and stable  Last Vitals:  Vitals:   01/24/17 1026 01/24/17 1028  BP: (!) 132/92   Pulse: 95   Resp: 16   Temp: (!) 38 C 37.9 C    Last Pain:  Vitals:   01/24/17 1028  TempSrc: Temporal  PainSc:       Patients Stated Pain Goal: 0 (24/23/53 6144)  Complications: No apparent anesthesia complications

## 2017-01-24 NOTE — Anesthesia Procedure Notes (Signed)
Procedure Name: Intubation Date/Time: 01/24/2017 11:15 AM Performed by: [facility] Pre-anesthesia Checklist: Patient identified, Patient being monitored, Timeout performed, Emergency Drugs available and Suction available Patient Re-evaluated:Patient Re-evaluated prior to inductionOxygen Delivery Method: Circle system utilized Preoxygenation: Pre-oxygenation with 100% oxygen Intubation Type: IV induction Ventilation: Mask ventilation without difficulty Laryngoscope Size: Mac and 3 Grade View: Grade I Tube type: Oral Tube size: 7.0 mm Number of attempts: 1 Airway Equipment and Method: Stylet Placement Confirmation: ETT inserted through vocal cords under direct vision,  positive ETCO2 and breath sounds checked- equal and bilateral Secured at: 21 cm Tube secured with: Tape Dental Injury: Teeth and Oropharynx as per pre-operative assessment

## 2017-01-24 NOTE — Brief Op Note (Signed)
01/23/2017 - 01/24/2017  3:35 PM  PATIENT:  Patricia Maldonado  36 y.o. female  PRE-OPERATIVE DIAGNOSIS:  RIGHT STAGHORN  POST-OPERATIVE DIAGNOSIS:  RIGHT STAGHORN  PROCEDURE:  Procedure(s): NEPHROLITHOTOMY PERCUTANEOUS (Right)  SURGEON:  Surgeon(s) and Role:    * [name], MD - Primary  ASSISTANTS: none   ANESTHESIA:   general  EBL:  Total I/O In: 1050 [I.V.:1050] Out: 500 [Blood:500]  Drains: 16 Fr Foley, 18 Fr nephrostomy x 2, JJ 6 x 24 Fr JJ ureteral stent  Specimen: stone fragment  COUNTS CORRECT: YES  PLAN OF CARE: Admit for overnight observation  PATIENT DISPOSITION:  PACU - hemodynamically stable.

## 2017-01-24 NOTE — Progress Notes (Signed)
Nephrostomy tube with large output; has not voided, bladder scanned with 85ml noted in bladder at the time; IVF intake: 1252ml; Nephrostomy tube output: 1045ml; Positive for Staph aureus only, in nares; NPO for possible OR this am; Barbaraann Faster, RN 6:36 AM; 01/24/2017

## 2017-01-24 NOTE — Interval H&P Note (Signed)
History and Physical Interval Note:  01/24/2017 10:33 AM  Patricia Maldonado  has presented today for surgery, with the diagnosis of RIGHT STAGEHORN  The various methods of treatment have been discussed with the patient and family. After consideration of risks, benefits and other options for treatment, the patient has consented to  Procedure(s): NEPHROLITHOTOMY PERCUTANEOUS (N/A) HOLMIUM LASER APPLICATION (N/A) as a surgical intervention .  The patient's history has been reviewed, patient examined, no change in status, stable for surgery.  I have reviewed the patient's chart and labs.  Questions were answered to the patient's satisfaction.    Patient seen and examined today on the day of the procedure. She was admitted yesterday following nephrostomy tube placement which was allowed to drain overnight for maximal decompression. She is been on Cipro IV given at the time of nephrostomy tube placement as well as ceftriaxone. She does have a low-grade temp this morning to 100.4 but otherwise no signs or symptoms of bacteremia or sepsis. As such, we'll proceed today with the procedure is discussed and continue IV antibiotics.   [name]

## 2017-01-25 ENCOUNTER — Encounter: Payer: Self-pay | Admitting: Urology

## 2017-01-25 LAB — BASIC METABOLIC PANEL
Anion gap: 3 — ABNORMAL LOW (ref 5–15)
BUN: 9 mg/dL (ref 6–20)
CALCIUM: 7.2 mg/dL — AB (ref 8.9–10.3)
CO2: 22 mmol/L (ref 22–32)
Chloride: 114 mmol/L — ABNORMAL HIGH (ref 101–111)
Creatinine, Ser: 1.25 mg/dL — ABNORMAL HIGH (ref 0.44–1.00)
GFR calc Af Amer: >60 mL/min (ref >60)
GFR, EST NON AFRICAN AMERICAN: 56 mL/min — AB (ref >60)
GLUCOSE: 127 mg/dL — AB (ref 65–99)
Potassium: 4 mmol/L (ref 3.5–5.1)
Sodium: 139 mmol/L (ref 135–145)

## 2017-01-25 LAB — CBC
HEMATOCRIT: 23.8 % — AB (ref 35.0–47.0)
Hemoglobin: 7.7 g/dL — ABNORMAL LOW (ref 12.0–16.0)
MCH: 30.3 pg (ref 26.0–34.0)
MCHC: 32.5 g/dL (ref 32.0–36.0)
MCV: 93.3 fL (ref 80.0–100.0)
Platelets: 168 10*3/uL (ref 150–440)
RBC: 2.55 MIL/uL — ABNORMAL LOW (ref 3.80–5.20)
RDW: 14.8 % — AB (ref 11.5–14.5)
WBC: 13.9 10*3/uL — ABNORMAL HIGH (ref 3.6–11.0)

## 2017-01-25 MED ORDER — OXYBUTYNIN CHLORIDE 5 MG PO TABS: 5.0000 mg | ORAL_TABLET | Freq: Three times a day (TID) | ORAL | 0 refills | Status: DC | PRN

## 2017-01-25 MED ORDER — TAMSULOSIN HCL 0.4 MG PO CAPS: 0.4000 mg | ORAL_CAPSULE | Freq: Every day | ORAL | 0 refills | Status: DC

## 2017-01-25 MED ORDER — SULFAMETHOXAZOLE-TRIMETHOPRIM 800-160 MG PO TABS: 1.0000 | ORAL_TABLET | Freq: Two times a day (BID) | ORAL | 0 refills | Status: DC

## 2017-01-25 MED ORDER — OXYCODONE-ACETAMINOPHEN 5-325 MG PO TABS: 1.0000 | ORAL_TABLET | ORAL | 0 refills | Status: DC | PRN

## 2017-01-25 NOTE — Op Note (Signed)
Date of procedure: 01/24/17  Preoperative diagnosis:  1. Right staghorn calculus   Postoperative diagnosis:  1. Same as above   Procedure: 1. Right percutaneous nephrolithotomy, greater than 4 cm 2. Right antegrade nephrostogram 3. Right nephrostomy tube placement 4. Antegrade placement of right double-J ureteral stent 5. Interpretation of fluoroscopy less than 30 minutes  Surgeon: [name], MD  Anesthesia: General  Complications: None  Intraoperative findings: Large partially obstructing staghorn calculus  EBL: 500 cc  Specimens: stone fragements  Drains: 16 French Foley catheter, 18 [place] council tip catheter as nephrostomy tube 2, 6 x 24 French double-J ureteral stent  Indication: Patricia Maldonado is a 36 y.o. patient with with a history of lupus nephritis and a large at least 4 cm partial staghorn involving the mid and lower as well as UPJ the right kidney.  After reviewing the management options for treatment, she elected to proceed with the above surgical procedure(s). We have discussed the potential benefits and risks of the procedure, side effects of the proposed treatment, the likelihood of the patient achieving the goals of the procedure, and any potential problems that might occur during the procedure or recuperation. Informed consent has been obtained.  Description of procedure:  The patient was taken to the operating room and general anesthesia was induced.  Foley catheter was placed sterilely on the stretcher. She was then repositioned in the prone position with care taken to pad all pressure points. She was carefully strapped to the table.  Perioperative antibiotics were received and she is prepped and draped in the standard surgical fashion. A preoperative time-out was performed.   Patient was noted to have 2 nephroureteral catheters, a mid pole access in the form of a nephroureteral catheter open previously to drainage all the way down to the bladder with a  coil in the bladder on scout imaging. She also had a lower pole access which also traversed down the ureter. Contrast was injected to the nephroureteral catheter to confirm its position within the collecting system at which time the collecting system was outlined beautifully. Prior to this, the stone could be seen occupying the majority of the mid and lower pole of the kidney extending down the renal pelvis into the proximal ureter. The nephroureteral catheter was then cannulated using a sensor wire down to the level of the bladder. The wire was left in place and the nephroureteral catheter was removed. A dual lumen access sheath was used to the level of the proximal ureter to introduce a second Super Stiff wire down to level of the bladder. The sensor wire was snapped in place as a safety wire and the superstiff wire was used as a working wire. Fascial incisor needle was then used to incise the fascia carefully. A NephroMax balloon was then used to dilate the tract to the level of the stone under fluoroscopic guidance. This is not remain in place for several minutes to temp not any bleeding. The sheath was then advanced over the balloon into the appropriate location within the kidney. The balloon was deflated and no active bleeding was noted. The nephroscope was then brought in and the stone was immediately encountered. A cyber wand was used to fragment the majority of the stone in the midpole calyces down into the renal pelvis until these were adequately cleared. There was one residual calyx were stone could be identified but not accessed with the current access. Is not able to reach into the lower pole with the nephroscope either. Once  the majority of the mid pole calyces were cleared of all stone burden, a flexible cystoscope was used down the UPJ into the proximal ureter and it 0.8 [place] to plus nitinol basket was used to extract any residual stone fragments. An antegrade nephrostogram was then performed  through the scope and no significant extravasation was appreciated with contrast seen traversing down the ureter briskly.  Next, as was unable to access the lower pole, I dilated a second tract using the aforementioned described technique this time without a safety wire as 2 wires remained in place in the mid pole access down the ureter. I used the Super Stiff wire as the working wire. Upon placing the sheath, the stones in the lower pole or immediately encountered and obliterated using a cyber wand. There was one residual calyx in the extreme lower pole which was difficult to access. Attempted to use a flexible cystoscope but had some difficulty extracting this stone with the basket or reaching it with a cyber wand. Ultimately, I was able to clear her proximally 95% of all stone burden with 2 calyces with of stones which were not able to be accessed today. Finally, a series of antegrade nephrostograms were performed 3 to the tracts.  A 6 x 24 French double-J ureteral stent was placed through the lower pole access down to the level of the bladder and the wire was withdrawn until full coil was noted within the bladder and a full coil within the renal pelvis. 2 nephrostomy tubes replaced into the collecting system through each of the tracts using an 18 French Foley catheter. Each and balloons were inflated with 1.5 cc of half saline and half contrast material. The sheaths were removed and the nephrostomy tubes were left in place. A final nephrostogram confirmed again ureteral stent in good position, no significant extravasation of urine, and a significantly reduced over all stone burden. At this point time, the procedure was deemed complete. She was cleaned and dried and a dressing using 4 x 4's, AVD pads, and foam tape was applied. One of the nephrostomy tubes were capped and the other was left open to drainage. She was then repositioned in the supine position on the stretcher, extubated, and taken the PACU in  stable condition.  Plan: Patient will remain in-house overnight. Will remove her Foley catheter in the morning and clamp her nephrostomy tubes. If this is well tolerated, we will proceed with removal of her nephrostomy tubes prior to discharge. She will return to the office in 2 weeks to discuss returning to the operating room for staged ureteroscopy to clear her residual stone burden which was not able to be accessed today.  [name], M.D.

## 2017-01-25 NOTE — Progress Notes (Signed)
Pt d/c to home today.  IV removed intact.  Pt refused B&O suppository for bladder spasm/pain management.  Nephrostomy tube drain site CDI and dressing reinforced.  Education and teaching given on infection control/prevention.  Rx's given to pt w/all questions and concerns addressed.  D/C paperwork reviewed and education provided with all questions and concerns addressed.  Pt friend at bedside for home transport.

## 2017-01-25 NOTE — Discharge Summary (Signed)
Date of admission: 01/23/2017  Date of discharge: 01/25/2017  Admission diagnosis: Right staghorn calculus, lupus nephritis, UTI  Discharge diagnosis: Same as above  Secondary diagnoses:  Patient Active Problem List   Diagnosis Date Noted  . Kidney stone on left side 01/23/2017  . Systemic lupus erythematosus ([employer]) 12/08/2016  . Encounter for long-term (current) use of high-risk medication 12/08/2016  . 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 Acute renal failure (ARF) ([place]) 10/04/2016  . Nephrolithiasis   . Staghorn calculus   . Acute renal failure with tubular necrosis (HCC) 08/30/2016    History and Physical: For full details, please see admission history and physical. Briefly, Patricia Maldonado is a 36 y.o. year old patient with With fairly newly diagnosed lupus nephritis found to have a large staghorn calculus within the right kidney which was partially obstructing.  This presumably was chronically infected as all of her UAs have been frankly positive growing mixed flora. On the day of the scheduled procedure, she admitted to not having taken her preoperative antibiotics as counseled.Marland Kitchen   Hospital Course: Patient was taken to interventional radiology on 01/23/2017 for placement of a right percutaneous nephroureteral catheter 2. Given the concern for possible infection, she was admitted overnight with maximal renal drainage and started on IV antibiotics in the form of ceftriaxone in addition to her periprocedural Cipro. On post procedure day 1, she was taken to the operating room for right PCNL. Patient tolerated the procedure well.  She was then transferred to the floor after an uneventful PACU stay.  Her hospital course was uncomplicated.  On POD#1 she had met discharge criteria: was eating a regular diet, was up and ambulating independently,  pain was well controlled, was voiding without a catheter, and was ready to for discharge.  Her nephrostomies were removed at  bedside.  Physical Exam  Constitutional: She is oriented to person, place, and time. She appears well-developed and well-nourished.  HENT:  Head: Normocephalic and atraumatic.  Cardiovascular: Normal rate.   Genitourinary:  Genitourinary Comments: Nephrostomy tube site clean dry and intact. Nephrostomy tube was clamped this morning at which time light pink urine was draining. She tolerated clamping is removed later in the day.  Foley catheter draining clear yellow urine, removed on postop day 1.  Neurological: She is alert and oriented to person, place, and time.  Skin: Skin is warm and dry.  Vitals reviewed.    Laboratory values:   Recent Labs  01/24/17 0532 01/24/17 1552 01/25/17 0455  WBC 11.2* 18.7* 13.9*  HGB 10.3* 9.7* 7.7*  HCT 31.2* 30.9* 23.8*    Recent Labs  01/23/17 0633 01/24/17 0532 01/25/17 0455  NA 138 138 139  K 4.2 3.9 4.0  CL 107 109 114*  CO2 '25 24 22  ' GLUCOSE 87 91 127*  BUN 26* 13 9  CREATININE 1.07* 1.23* 1.25*  CALCIUM 9.0 8.0* 7.2*    Recent Labs  01/23/17 0633  INR 0.85   No results for input(s): LABURIN in the last 72 hours. Results for orders placed or performed during the hospital encounter of 01/23/17  Surgical PCR screen     Status: Abnormal   Collection Time: 01/24/17  1:44 AM  Result Value Ref Range Status   MRSA, PCR NEGATIVE NEGATIVE Final   Staphylococcus aureus POSITIVE (A) NEGATIVE Final    Comment:        The Xpert SA Assay (FDA approved for NASAL specimens in patients over 52 years of age),  is one component of a comprehensive surveillance program.  Test performance has been validated by [facility] for patients greater than or equal to 68 year old. It is not intended to diagnose infection nor to guide or monitor treatment.     Disposition: Home  Discharge instruction: The patient was instructed to be ambulatory but told to refrain from heavy lifting, strenuous activity, or driving while taking narcotics.   She is advised to dress wound as needed until drainage stops.    You have a ureteral stent in place.  This is a tube that extends from your kidney to your bladder.  This may cause urinary bleeding, burning with urination, and urinary frequency.  Please call our office or present to the ED if you develop fevers >101 or pain which is not able to be controlled with oral pain medications.  You may be given either Flomax and/ or ditropan to help with bladder spasms and stent pain in addition to pain medications.    [facility] [address], [place], [place] [address] [phone]   Discharge medications:  Allergies as of 01/25/2017      Reactions   Penicillins    Yeast infection Has patient had a PCN reaction causing immediate rash, facial/tongue/throat swelling, SOB or lightheadedness with hypotension:No Has patient had a PCN reaction causing severe rash involving mucus membranes or skin necrosis: Yes Has patient had a PCN reaction that required hospitalization No Has patient had a PCN reaction occurring within the last 10 years: No If all of the above answers are "NO", then may proceed with Cephalosporin use.      Medication List    TAKE these medications   acetaminophen 325 MG tablet Commonly known as:  TYLENOL Take 325 mg by mouth every 6 (six) hours as needed for mild pain.   acetaminophen 500 MG tablet Commonly known as:  TYLENOL Take 1-2 tablets (500-1,000 mg total) by mouth every 6 (six) hours as needed for mild pain.   CYTOXAN LYOPHILIZED IV Inject 1 Dose into the vein every 30 (thirty) days.   hydroxychloroquine 200 MG tablet Commonly known as:  PLAQUENIL Take 1 tablet (200 mg total) by mouth daily. What changed:  when to take this   metroNIDAZOLE 0.75 % cream Commonly known as:  METROCREAM Apply topically 2 (two) times daily.   oxybutynin 5 MG tablet Commonly known as:  DITROPAN Take 1 tablet (5 mg total) by mouth every 8  (eight) hours as needed for bladder spasms.   oxyCODONE-acetaminophen 5-325 MG tablet Commonly known as:  PERCOCET/ROXICET Take 1-2 tablets by mouth every 4 (four) hours as needed for moderate pain.   predniSONE 10 MG tablet Commonly known as:  DELTASONE TAKE 1 TABLET BY MOUTH ONCE A DAY IN THE MORNING   sulfamethoxazole-trimethoprim 800-160 MG tablet Commonly known as:  BACTRIM DS,SEPTRA DS Take 1 tablet by mouth every 12 (twelve) hours. What changed:  Another medication with the same name was added. Make sure you understand how and when to take each.   sulfamethoxazole-trimethoprim 800-160 MG tablet Commonly known as:  BACTRIM DS,SEPTRA DS Take 1 tablet by mouth 2 (two) times daily. What changed:  You were already taking a medication with the same name, and this prescription was added. Make sure you understand how and when to take each.   tamsulosin 0.4 MG Caps capsule Commonly known as:  FLOMAX Take 1 capsule (0.4 mg total) by mouth daily.   triamcinolone cream 0.1 % Commonly  known as:  KENALOG Apply topically 2 (two) times daily.       Followup:  Follow-up Information    [name], MD In 2 weeks.   Specialty:  Urology Why:  discuss next procedure Contact information: [address] [place] [id number] [phone]

## 2017-01-26 LAB — TYPE AND SCREEN
ABO/RH(D): B POS
Antibody Screen: NEGATIVE
UNIT DIVISION: 0
Unit division: 0

## 2017-01-26 LAB — BPAM RBC
Blood Product Expiration Date: 201805032359
Blood Product Expiration Date: 201805142359
UNIT TYPE AND RH: 7300
Unit Type and Rh: 1700

## 2017-01-26 LAB — PREPARE RBC (CROSSMATCH)

## 2017-02-01 ENCOUNTER — Other Ambulatory Visit: Payer: Self-pay | Admitting: Radiology

## 2017-02-01 ENCOUNTER — Encounter: Payer: Self-pay | Admitting: *Deleted

## 2017-02-01 ENCOUNTER — Telehealth: Payer: Self-pay | Admitting: Radiology

## 2017-02-01 DIAGNOSIS — N200 Calculus of kidney: Secondary | ICD-10-CM

## 2017-02-01 NOTE — Telephone Encounter (Signed)
Notified pt of surgery scheduled with Dr Erlene Quan on 02/21/17, pre-admit testing appt & to call day prior to surgery for arrival time to SDS. Encouraged pt to keep appt with Dr Erlene Quan in clinic on 02/13/17. Questions answered. Pt voices understanding.

## 2017-02-01 NOTE — Telephone Encounter (Signed)
LMOM. Need to discuss surgery information. 

## 2017-02-07 ENCOUNTER — Ambulatory Visit: Payer: Medicaid Other

## 2017-02-08 LAB — STONE ANALYSIS
Ammonium Acid Urate: 5 %
Ca phos cry stone ql IR: 50 %
Magnesium Ammon Phos: 45 %
Stone Weight KSTONE: 642.7 mg

## 2017-02-13 ENCOUNTER — Ambulatory Visit (INDEPENDENT_AMBULATORY_CARE_PROVIDER_SITE_OTHER): Payer: Medicaid Other | Admitting: Urology

## 2017-02-13 ENCOUNTER — Encounter: Payer: Self-pay | Admitting: Urology

## 2017-02-13 ENCOUNTER — Other Ambulatory Visit: Payer: Self-pay | Admitting: Radiology

## 2017-02-13 VITALS — BP 119/77 | HR 96 | Ht 62.0 in | Wt 106.0 lb

## 2017-02-13 DIAGNOSIS — D62 Acute posthemorrhagic anemia: Secondary | ICD-10-CM

## 2017-02-13 DIAGNOSIS — N200 Calculus of kidney: Secondary | ICD-10-CM

## 2017-02-13 DIAGNOSIS — M3214 Glomerular disease in systemic lupus erythematosus: Secondary | ICD-10-CM

## 2017-02-13 NOTE — Progress Notes (Signed)
02/13/2017 5:16 PM   Patricia Maldonado 1980/12/31 [id number]  Referring provider: Letta Median, MD [address], [place] [address]  Chief Complaint  Patient presents with  . Nephrolithiasis    discuss next procedure    HPI: 36 year old female with newly diagnosed lupus nephritis and a large right staghorn calculus measuring greater than 4 cm 2 underwent multiple access right percutaneous nephrolithotomy on 01/25/2017.  The procedure was uncomplicated other than for some acute blood loss anemia not requiring transfusion. An antegrade double-J ureteral stent was placed. Postoperatively, she's had no issues. She does note that she occasionally feels short of breath when she walks up stairs, otherwise has no complaints. The incision site is clean and intact and no longer draining. She has minimal discomfort from the stent. No fevers or chills.  Approximately 95% of the stone burden was treated. She does have 2 calyces which were not able to be accessed at the time of PCNL. She returns today discussed planned staged ureteroscopy to treat her residual stone burden.  Stone composition consistent with struvite stone, 50% calcium phosphate, 45% magnesium ammonium phosphate, 5% ammonium urate.   PMH: Past Medical History:  Diagnosis Date  . Acute renal failure (ARF) ([place])   . Anemia   . History of kidney stones   . Hypertension    OFF BP MEDS X 3 WEEKS DUE TO BP CONTROL  . Lupus nephritis ([place]) 12/2016   DX DURING HOSPITAL ADMISSION  . Rash   . Staghorn calculus   . 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 IR NEPHROSTOMY PLACEMENT RIGHT  01/23/2017  . NEPHROLITHOTOMY Right 01/24/2017   Procedure: NEPHROLITHOTOMY PERCUTANEOUS;  Surgeon: [name], MD;  Location: [facility] ORS;  Service: Urology;  Laterality: Right;  . TUBAL LIGATION      Home Medications:    Allergies as of 02/13/2017      Reactions   Penicillins    Yeast infection Has patient had a PCN reaction causing immediate rash, facial/tongue/throat swelling, SOB or lightheadedness with hypotension:No Has patient had a PCN reaction causing severe rash involving mucus membranes or skin necrosis: Yes Has patient had a PCN reaction that required hospitalization No Has patient had a PCN reaction occurring within the last 10 years: No If all of the above answers are "NO", then may proceed with Cephalosporin use.      Medication List       Accurate as of 02/13/17  5:16 PM. Always use your most recent med list.          acetaminophen 500 MG tablet Commonly known as:  TYLENOL Take 1-2 tablets (500-1,000 mg total) by mouth every 6 (six) hours as needed for mild pain.   CYTOXAN LYOPHILIZED IV Inject 1 Dose into the vein every 30 (thirty) days.   hydroxychloroquine 200 MG tablet Commonly known as:  PLAQUENIL Take 1 tablet (200 mg total) by mouth daily.   metroNIDAZOLE 0.75 % cream Commonly known as:  METROCREAM Apply topically 2 (two) times daily.   predniSONE 10 MG tablet Commonly known as:  DELTASONE TAKE 1 TABLET BY MOUTH ONCE A DAY IN THE MORNING   sulfamethoxazole-trimethoprim 800-160 MG tablet Commonly known as:  BACTRIM DS,SEPTRA DS Take 1 tablet by mouth every 12 (twelve) hours.   tamsulosin 0.4 MG Caps capsule Commonly known as:  FLOMAX Take 1 capsule (0.4 mg total) by mouth daily.  Allergies:  Allergies  Allergen Reactions  . Penicillins     Yeast infection Has patient had a PCN reaction causing immediate rash, facial/tongue/throat swelling, SOB or lightheadedness with hypotension:No Has patient had a PCN reaction causing severe rash involving mucus membranes or skin necrosis: Yes Has patient had a PCN reaction that required hospitalization No Has patient had a PCN reaction occurring within the last 10 years: No If all of the above answers are "NO",  then may proceed with Cephalosporin use.     Family History: Family History  Problem Relation Age of Onset  . Thyroid disease Mother   . Thyroid disease Father   . Diabetes Father     Social History:  reports that she has been smoking Cigarettes.  She has a 15.00 pack-year smoking history. She has never used smokeless tobacco. She reports that she uses drugs, including Marijuana. She reports that she does not drink alcohol.  ROS: UROLOGY Frequent Urination?: No Hard to postpone urination?: No Burning/pain with urination?: No Get up at night to urinate?: No Leakage of urine?: No Urine stream starts and stops?: No Trouble starting stream?: No Do you have to strain to urinate?: No Blood in urine?: No Urinary tract infection?: No Sexually transmitted disease?: No Injury to kidneys or bladder?: No Painful intercourse?: No Weak stream?: No Currently pregnant?: No Vaginal bleeding?: No Last menstrual period?: n  Gastrointestinal Nausea?: No Vomiting?: No Indigestion/heartburn?: No Diarrhea?: No Constipation?: No  Constitutional Fever: No Night sweats?: Yes Weight loss?: No Fatigue?: No  Skin Skin rash/lesions?: No Itching?: No  Eyes Blurred vision?: No Double vision?: No  Ears/Nose/Throat Sore throat?: No Sinus problems?: No  Hematologic/Lymphatic Swollen glands?: No Easy bruising?: No  Cardiovascular Leg swelling?: No Chest pain?: No  Respiratory Cough?: No Shortness of breath?: Yes  Endocrine Excessive thirst?: Yes  Musculoskeletal Back pain?: No Joint pain?: No  Neurological Headaches?: Yes Dizziness?: Yes  Psychologic Depression?: No Anxiety?: No  Physical Exam: BP 119/77   Pulse 96   Ht 5\' 2"  (1.575 m)   Wt 106 lb (48.1 kg)   LMP 01/20/2017 (Approximate)   BMI 19.39 kg/m   Constitutional:  Alert and oriented, No acute distress. HEENT: [place] AT, moist mucus membranes.  Trachea midline, no masses.  Facial fullness  appreciated. Cardiovascular: No clubbing, cyanosis, or edema. Respiratory: Normal respiratory effort, no increased work of breathing. GI: Abdomen is soft, nontender, nondistended, no abdominal masses GU: Right flank incision well healed 2. Skin: No rashes, bruises or suspicious lesions.. Neurologic: Grossly intact, no focal deficits, moving all 4 extremities. Psychiatric: Normal mood and affect.  Laboratory Data: Lab Results  Component Value Date   WBC 13.9 (H) 01/25/2017   HGB 7.7 (L) 01/25/2017   HCT 23.8 (L) 01/25/2017   MCV 93.3 01/25/2017   PLT 168 01/25/2017    Lab Results  Component Value Date   CREATININE 1.25 (H) 01/25/2017    Urinalysis Preop urine culture obtained today  Pertinent Imaging: No new imaging  Assessment & Plan:    1. Staghorn calculus Status post multiaccess right PCNL addressing approximately 95% of all stone burden Stone analysis reviewed with the patient-consistent with struvite stone  I recommended returning to the operating room for staged procedure to treat all residual stone burden. Risks and benefits of ureteroscopy were reviewed including but not limited to infection, bleeding, pain, ureteral injury which could require open surgery versus prolonged indwelling if ureteralperforation occurs, persistent stone disease, requirement for staged procedure, possible stent, and global anesthesia  risks. Patient expressed understanding and desires to proceed with ureteroscopy.  - CULTURE, URINE COMPREHENSIVE - Hemoglobin and Hematocrit, Blood  2. Lupus nephritis ([place]) Managed by nephrology, creatinine improving  3. Acute blood loss anemia Acute blood loss anemia following PCNL, we'll recheck today preop given her shortness of breath   [name], MD  [facility] [address], [place], [place] [address] [phone]

## 2017-02-14 ENCOUNTER — Inpatient Hospital Stay: Admission: RE | Admit: 2017-02-14 | Payer: Medicaid Other | Source: Ambulatory Visit

## 2017-02-14 LAB — HEMOGLOBIN AND HEMATOCRIT, BLOOD
Hematocrit: 25.6 % — ABNORMAL LOW (ref 34.0–46.6)
Hemoglobin: 8.1 g/dL — ABNORMAL LOW (ref 11.1–15.9)

## 2017-02-16 LAB — CULTURE, URINE COMPREHENSIVE

## 2017-02-17 NOTE — Telephone Encounter (Signed)
Pt was a no show for pre-admit testing appt on 02/14/17. LMOM on pt's cell [phone] to go to pre-admit testing for labwork on Monday 02/20/17 @12 :00. Also spoke with pt's mother at [phone] & gave same information & requested pt return call.

## 2017-02-19 NOTE — Progress Notes (Signed)
[place]  [phone] [phone]  ID: Lin Givens OB: 04-21-1981  MR#: [id number]  RWE#:[id number]  Patient Care Team: Letta Median, MD as PCP - General (Family Medicine) Center, [place] (General Practice)  CHIEF COMPLAINT: Lupus nephritis.  INTERVAL HISTORY: Patient returns to clinic today for further evaluation and continuation of Cytoxan. She has only received one dose of treatment on December 05, 2016. She has worsening shortness of breath and dyspnea on exertion, but otherwise feels well. She has no neurologic complaints. She denies any recent fevers or illnesses. She has a good appetite and denies weight loss. She has no chest pain. She denies any nausea, vomiting, constipation, or diarrhea. She has no urinary complaints. Patient offers no further specific complaints today.  REVIEW OF SYSTEMS:   Review of Systems  Constitutional: Negative.  Negative for fever, malaise/fatigue and weight loss.  Respiratory: Positive for shortness of breath. Negative for cough.   Cardiovascular: Negative.  Negative for chest pain and leg swelling.  Gastrointestinal: Negative.  Negative for abdominal pain.  Genitourinary: Negative.   Musculoskeletal: Negative.   Skin: Negative.  Negative for rash.  Neurological: Negative for weakness.  Psychiatric/Behavioral: Negative.  The patient is not nervous/anxious.     As per HPI. Otherwise, a complete review of systems is negative.  PAST MEDICAL HISTORY: Past Medical History:  Diagnosis Date  . Acute renal failure (ARF) ([place])   . Anemia   . History of kidney stones   . Hypertension    OFF BP MEDS X 3 WEEKS DUE TO BP CONTROL  . Lupus nephritis ([place]) 12/2016   DX DURING HOSPITAL ADMISSION  . Rash   . Staghorn calculus   . 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 IR NEPHROSTOMY  PLACEMENT RIGHT  01/23/2017  . NEPHROLITHOTOMY Right 01/24/2017   Procedure: NEPHROLITHOTOMY PERCUTANEOUS;  Surgeon: [name], MD;  Location: [facility] ORS;  Service: Urology;  Laterality: Right;  . TUBAL LIGATION      FAMILY HISTORY: Family History  Problem Relation Age of Onset  . Thyroid disease Mother   . Thyroid disease Father   . Diabetes Father     ADVANCED DIRECTIVES (Y/N):  N  HEALTH MAINTENANCE: Social History  Substance Use Topics  . Smoking status: Current Every Day Smoker    Packs/day: 1.00    Years: 15.00    Types: Cigarettes  . Smokeless tobacco: Never Used  . Alcohol use No     Colonoscopy:  PAP:  Bone density:  Lipid panel:  Allergies  Allergen Reactions  . Penicillins     Yeast infection Has patient had a PCN reaction causing immediate rash, facial/tongue/throat swelling, SOB or lightheadedness with hypotension:No Has patient had a PCN reaction causing severe rash involving mucus membranes or skin necrosis: Yes Has patient had a PCN reaction that required hospitalization No Has patient had a PCN reaction occurring within the last 10 years: No If all of the above answers are "NO", then may proceed with Cephalosporin use.     Current Outpatient Prescriptions  Medication Sig Dispense Refill  . acetaminophen (TYLENOL) 500 MG tablet Take 1-2 tablets (500-1,000 mg total) by mouth every 6 (six) hours as needed for mild pain. 30 tablet 0  . Cyclophosphamide (CYTOXAN LYOPHILIZED IV) Inject 1 Dose into the vein every 30 (thirty) days.    . hydroxychloroquine (PLAQUENIL) 200 MG tablet  Take 1 tablet (200 mg total) by mouth daily. (Patient taking differently: Take 200 mg by mouth every evening. ) 30 tablet 0  . metroNIDAZOLE (METROCREAM) 0.75 % cream Apply topically 2 (two) times daily. 45 g 0  . predniSONE (DELTASONE) 10 MG tablet TAKE 1 TABLET BY MOUTH ONCE A DAY IN THE MORNING  1  . sulfamethoxazole-trimethoprim (BACTRIM DS,SEPTRA DS) 800-160 MG tablet Take 1  tablet by mouth every 12 (twelve) hours. 14 tablet 0  . tamsulosin (FLOMAX) 0.4 MG CAPS capsule Take 1 capsule (0.4 mg total) by mouth daily. 30 capsule 0   No current facility-administered medications for this visit.    Facility-Administered Medications Ordered in Other Visits  Medication Dose Route Frequency Provider Last Rate Last Dose  . 0.9 %  sodium chloride infusion   Intravenous Once Lloyd Huger, MD      . cyclophosphamide (CYTOXAN) 740 mg in sodium chloride 0.9 % 250 mL chemo infusion  500 mg/m2 (Treatment Plan Recorded) Intravenous Once Lloyd Huger, MD      . dexamethasone (DECADRON) 10 mg in sodium chloride 0.9 % 50 mL IVPB  10 mg Intravenous Once Lloyd Huger, MD      . palonosetron (ALOXI) injection 0.25 mg  0.25 mg Intravenous Once Lloyd Huger, MD        OBJECTIVE: Vitals:   02/20/17 0923  BP: (!) 136/94  Pulse: 99  Resp: 20  Temp: 98.4 F (36.9 C)     Body mass index is 20.08 kg/m.    ECOG FS:0 - Asymptomatic  General: Well-developed, well-nourished, no acute distress. Eyes: Pink conjunctiva, anicteric sclera. Lungs: Clear to auscultation bilaterally. Heart: Regular rate and rhythm. No rubs, murmurs, or gallops. Abdomen: Soft, nontender, nondistended. No organomegaly noted, normoactive bowel sounds. Musculoskeletal: No edema, cyanosis, or clubbing. Neuro: Alert, answering all questions appropriately. Cranial nerves grossly intact. Skin: No rashes or petechiae noted. Psych: Normal affect.   LAB RESULTS:  Lab Results  Component Value Date   NA 138 02/20/2017   K 3.6 02/20/2017   CL 107 02/20/2017   CO2 26 02/20/2017   GLUCOSE 90 02/20/2017   BUN 17 02/20/2017   CREATININE 1.31 (H) 02/20/2017   CALCIUM 9.2 02/20/2017   PROT 7.1 02/20/2017   ALBUMIN 3.3 (L) 02/20/2017   AST 18 02/20/2017   ALT 13 (L) 02/20/2017   ALKPHOS 50 02/20/2017   BILITOT 0.4 02/20/2017   GFRNONAA 53 (L) 02/20/2017   GFRAA >60 02/20/2017    Lab  Results  Component Value Date   WBC 8.9 02/20/2017   NEUTROABS 6.5 02/20/2017   HGB 8.8 (L) 02/20/2017   HCT 26.5 (L) 02/20/2017   MCV 87.7 02/20/2017   PLT 292 02/20/2017     STUDIES: Dg Abd 1 View  Result Date: 01/24/2017 CLINICAL DATA:  Nephrostomy. EXAM: ABDOMEN - 1 VIEW; DG C-ARM 61-120 MIN COMPARISON:  Abdominal CT 10/04/2016 FINDINGS: Fluoroscopic images over the right flank show sequential stages of percutaneous nephrostomy lithotomy. On the final 2 images a nephroureteral stent has been removed and internal double-J ureteral stent has been placed. IMPRESSION: Fluoroscopy for right percutaneous nephrostolithotomy. Electronically Signed   By: Monte Fantasia M.D.   On: 01/24/2017 15:21   Dg C-arm 61-120 Min  Result Date: 01/24/2017 CLINICAL DATA:  Nephrostomy. EXAM: ABDOMEN - 1 VIEW; DG C-ARM 61-120 MIN COMPARISON:  Abdominal CT 10/04/2016 FINDINGS: Fluoroscopic images over the right flank show sequential stages of percutaneous nephrostomy lithotomy. On the final 2 images a  nephroureteral stent has been removed and internal double-J ureteral stent has been placed. IMPRESSION: Fluoroscopy for right percutaneous nephrostolithotomy. Electronically Signed   By: Monte Fantasia M.D.   On: 01/24/2017 15:21   Ir Nephrostomy Placement Right  Result Date: 01/23/2017 CLINICAL DATA:  Symptomatic right nephrolithiasis, planned percutaneous nephrolithotomy EXAM: RIGHT PERCUTANEOUS NEPHROURETERAL CATHETER PLACEMENT x2 UNDER FLUOROSCOPIC GUIDANCE FLUOROSCOPY TIME:  11.4 minutes, 75 mGy TECHNIQUE: The procedure, risks (including but not limited to bleeding, infection, organ damage ), benefits, and alternatives were explained to the patient and family. Questions regarding the procedure were encouraged and answered. The patient understands and consents to the procedure. Right flank region prepped , draped in usual sterile fashion, infiltrated locally with 1% lidocaine. Intravenous Fentanyl and Versed were  administered as conscious sedation during continuous cardiorespiratory monitoring by the radiology RN, with a total moderate sedation time of 60 minutes. Under real-time fluoroscopic guidance, a 19 gauge percutaneous entry needle was advanced into a midpole calyx using the peripheral radiodense calculus as a guide. Urine spontaneously returned through the needle. Contrast injection confirmed appropriate positioning. A 035 guidewire advanced into the renal collecting system. The needle was exchanged for a short 5 [place] Kumpe catheter, advanced with the aid of an angled Glidewire easily down the ureter. Tract was dilated to allow placement of a 10 French antegrade nephroureteral catheter. The catheter was placed to external gravity drain bag. With similar technique, a 19 gauge percutaneous entry needle was advanced into a right lower pole calyx using the peripheral radiodense calculus as a guide. Urine spontaneously returned through the needle. A 035 guidewire advanced centrally. A 5 French Kumpe catheter placed. A 035 glidewire advanced down the ureter. There is difficulty advancing the catheter beyond the distal ureter into the urinary bladder despite the use of Amplatz stiff guidewire and coaxial 9 French peel-away sheath, suggesting distal ureteral calculus. A 4 French glide catheter was advanced into the urinary bladder and capped externally. Radiograph confirms appropriate positioning of both nephroureteral catheters . The superior nephroureteral catheter was secured with 0 Prolene suture. The inferior 4 French nephroureteral catheter was secured with Tegaderm. Findings were telephoned to Dr. Erlene Quan. The patient tolerated the procedure well. COMPLICATIONS: COMPLICATIONS None. IMPRESSION: 1. Technically successful RIGHT percutaneous nephroureteral catheter placement x2 as above. Electronically Signed   By: Lucrezia Europe M.D.   On: 01/23/2017 11:58    ASSESSMENT: Lupus nephritis  PLAN:    1. Lupus nephritis:  Patient has only received 1 dose of Cytoxan on December 05, 2016. Proceed with cycle 2 of 500 mg/m of IV Cytoxan today. Patient will also receive 10 mg IV Decadron and 0.25 mg Aloxi as premedications. Patient expressed understanding that all of her laboratory work will be monitored by nephrology. If she has any questions or concerns regarding her lupus nephritis or her treatments should be deferred to them as well. Plan is to give 5-6 additional treatments every 28 days. Return to clinic in 1 month for consideration of her next infusion of Cytoxan. 2. Anemia: Mild, likely secondary to chronic renal insufficiency. Monitor. 3. Chronic renal insufficiency: Treatment per nephrology. Cytoxan as above. 4. Shortness of breath: Unclear etiology. Monitor.  Approximately 30 minutes was spent in discussion of which greater than 50% was consultation.  Patient expressed understanding and was in agreement with this plan. She also understands that She can call clinic at any time with any questions, concerns, or complaints.   Lloyd Huger, MD   02/20/2017 10:01 AM

## 2017-02-20 ENCOUNTER — Inpatient Hospital Stay: Payer: Medicaid Other | Attending: Oncology | Admitting: *Deleted

## 2017-02-20 ENCOUNTER — Encounter
Admission: RE | Admit: 2017-02-20 | Discharge: 2017-02-20 | Disposition: A | Discharge status: Home / Self Care | Payer: Medicaid Other | Source: Ambulatory Visit | Attending: Urology | Admitting: Urology

## 2017-02-20 ENCOUNTER — Inpatient Hospital Stay: Payer: Medicaid Other

## 2017-02-20 ENCOUNTER — Inpatient Hospital Stay (HOSPITAL_BASED_OUTPATIENT_CLINIC_OR_DEPARTMENT_OTHER): Payer: Medicaid Other | Admitting: Oncology

## 2017-02-20 VITALS — BP 136/94 | HR 99 | Temp 98.4°F | Resp 20 | Wt 109.8 lb

## 2017-02-20 DIAGNOSIS — Z87442 Personal history of urinary calculi: Secondary | ICD-10-CM

## 2017-02-20 DIAGNOSIS — Z7952 Long term (current) use of systemic steroids: Secondary | ICD-10-CM | POA: Insufficient documentation

## 2017-02-20 DIAGNOSIS — R0602 Shortness of breath: Secondary | ICD-10-CM | POA: Insufficient documentation

## 2017-02-20 DIAGNOSIS — D649 Anemia, unspecified: Secondary | ICD-10-CM | POA: Insufficient documentation

## 2017-02-20 DIAGNOSIS — I129 Hypertensive chronic kidney disease with stage 1 through stage 4 chronic kidney disease, or unspecified chronic kidney disease: Secondary | ICD-10-CM

## 2017-02-20 DIAGNOSIS — N179 Acute kidney failure, unspecified: Secondary | ICD-10-CM

## 2017-02-20 DIAGNOSIS — Z79899 Other long term (current) drug therapy: Secondary | ICD-10-CM | POA: Insufficient documentation

## 2017-02-20 DIAGNOSIS — Z5111 Encounter for antineoplastic chemotherapy: Secondary | ICD-10-CM | POA: Insufficient documentation

## 2017-02-20 DIAGNOSIS — M3214 Glomerular disease in systemic lupus erythematosus: Secondary | ICD-10-CM | POA: Insufficient documentation

## 2017-02-20 DIAGNOSIS — Z880 Allergy status to penicillin: Secondary | ICD-10-CM | POA: Insufficient documentation

## 2017-02-20 DIAGNOSIS — N189 Chronic kidney disease, unspecified: Secondary | ICD-10-CM | POA: Diagnosis not present

## 2017-02-20 DIAGNOSIS — F17210 Nicotine dependence, cigarettes, uncomplicated: Secondary | ICD-10-CM | POA: Insufficient documentation

## 2017-02-20 DIAGNOSIS — M329 Systemic lupus erythematosus, unspecified: Secondary | ICD-10-CM

## 2017-02-20 LAB — COMPREHENSIVE METABOLIC PANEL
ALK PHOS: 50 U/L (ref 38–126)
ALT: 13 U/L — ABNORMAL LOW (ref 14–54)
AST: 18 U/L (ref 15–41)
Albumin: 3.3 g/dL — ABNORMAL LOW (ref 3.5–5.0)
Anion gap: 5 (ref 5–15)
BUN: 17 mg/dL (ref 6–20)
CALCIUM: 9.2 mg/dL (ref 8.9–10.3)
CO2: 26 mmol/L (ref 22–32)
Chloride: 107 mmol/L (ref 101–111)
Creatinine, Ser: 1.31 mg/dL — ABNORMAL HIGH (ref 0.44–1.00)
GFR calc Af Amer: >60 mL/min (ref >60)
GFR calc non Af Amer: 53 mL/min — ABNORMAL LOW (ref >60)
GLUCOSE: 90 mg/dL (ref 65–99)
Potassium: 3.6 mmol/L (ref 3.5–5.1)
SODIUM: 138 mmol/L (ref 135–145)
Total Bilirubin: 0.4 mg/dL (ref 0.3–1.2)
Total Protein: 7.1 g/dL (ref 6.5–8.1)

## 2017-02-20 LAB — CBC WITH DIFFERENTIAL/PLATELET
BASOS PCT: 0 %
Basophils Absolute: 0 10*3/uL (ref 0–0.1)
EOS ABS: 0 10*3/uL (ref 0–0.7)
Eosinophils Relative: 1 %
HEMATOCRIT: 26.5 % — AB (ref 35.0–47.0)
HEMOGLOBIN: 8.8 g/dL — AB (ref 12.0–16.0)
LYMPHS ABS: 1.8 10*3/uL (ref 1.0–3.6)
Lymphocytes Relative: 20 %
MCH: 29.1 pg (ref 26.0–34.0)
MCHC: 33.2 g/dL (ref 32.0–36.0)
MCV: 87.7 fL (ref 80.0–100.0)
Monocytes Absolute: 0.6 10*3/uL (ref 0.2–0.9)
Monocytes Relative: 6 %
NEUTROS ABS: 6.5 10*3/uL (ref 1.4–6.5)
NEUTROS PCT: 73 %
Platelets: 292 10*3/uL (ref 150–440)
RBC: 3.03 MIL/uL — AB (ref 3.80–5.20)
RDW: 15.4 % — ABNORMAL HIGH (ref 11.5–14.5)
WBC: 8.9 10*3/uL (ref 3.6–11.0)

## 2017-02-20 MED ORDER — CIPROFLOXACIN IN D5W 400 MG/200ML IV SOLN
400.0000 mg | INTRAVENOUS | Status: AC
  Administered 2017-02-21: 400 mg via INTRAVENOUS

## 2017-02-20 MED ORDER — SODIUM CHLORIDE 0.9 % IV SOLN: 10.0000 mg | Freq: Once | INTRAVENOUS | Status: DC

## 2017-02-20 MED ORDER — SODIUM CHLORIDE 0.9 % IV SOLN
Freq: Once | INTRAVENOUS | Status: AC
  Administered 2017-02-20: 10:00:00 via INTRAVENOUS
  Filled 2017-02-20: qty 1000

## 2017-02-20 MED ORDER — PALONOSETRON HCL INJECTION 0.25 MG/5ML
0.2500 mg | Freq: Once | INTRAVENOUS | Status: AC
  Administered 2017-02-20: 0.25 mg via INTRAVENOUS
  Filled 2017-02-20: qty 5

## 2017-02-20 MED ORDER — SODIUM CHLORIDE 0.9 % IV SOLN
500.0000 mg/m2 | Freq: Once | INTRAVENOUS | Status: AC
  Administered 2017-02-20: 740 mg via INTRAVENOUS
  Filled 2017-02-20: qty 37

## 2017-02-20 MED ORDER — DEXAMETHASONE SODIUM PHOSPHATE 10 MG/ML IJ SOLN
10.0000 mg | Freq: Once | INTRAMUSCULAR | Status: AC
  Administered 2017-02-20: 10 mg via INTRAVENOUS
  Filled 2017-02-20: qty 1

## 2017-02-20 NOTE — Patient Instructions (Signed)
  Your procedure is scheduled on: 02-21-17 TUESDAY Report to Same Day Surgery 2nd floor medical mall [facility] Entrance-take elevator on left to 2nd floor.  Check in with surgery information desk.) @ 9 AM   Remember: Instructions that are not followed completely may result in serious medical risk, up to and including death, or upon the discretion of your surgeon and anesthesiologist your surgery may need to be rescheduled.    _x___ 1. Do not eat food or drink liquids after midnight. No gum chewing or hard candies.     __x__ 2. No Alcohol for 24 hours before or after surgery.   __x__3. No Smoking for 24 prior to surgery.   ____  4. Bring all medications with you on the day of surgery if instructed.    __x__ 5. Notify your doctor if there is any change in your medical condition     (cold, fever, infections).     Do not wear jewelry, make-up, hairpins, clips or nail polish.  Do not wear lotions, powders, or perfumes. You may wear deodorant.  Do not shave 48 hours prior to surgery. Men may shave face and neck.  Do not bring valuables to the hospital.    [facility] is not responsible for any belongings or valuables.               Contacts, dentures or bridgework may not be worn into surgery.  Leave your suitcase in the car. After surgery it may be brought to your room.  For patients admitted to the hospital, discharge time is determined by your treatment team.   Patients discharged the day of surgery will not be allowed to drive home.  You will need someone to drive you home and stay with you the night of your procedure.    Please read over the following fact sheets that you were given:    _x___ [place] WITH A SMALL SIP OF WATER. These include:  1. PREDNISONE  2.  3.  4.  5.  6.  ____Fleets enema or Magnesium Citrate as directed.   ____ Use CHG Soap or sage wipes as directed on instruction sheet   ____ Use inhalers on the day of  surgery and bring to hospital day of surgery  ____ Stop Metformin and Janumet 2 days prior to surgery.    ____ Take 1/2 of usual insulin dose the night before surgery and none on the morning surgery.   ____ Follow recommendations from Cardiologist, Pulmonologist or PCP regarding stopping Aspirin, Coumadin, Pllavix ,Eliquis, Effient, or Pradaxa, and Pletal.  X____Stop Anti-inflammatories such as Advil, Aleve, Ibuprofen, Motrin, Naproxen, Naprosyn, Goodies powders or aspirin products NOW-OK to take Tylenol    ____ Stop supplements until after surgery.     ____ Bring C-Pap to the hospital.

## 2017-02-20 NOTE — Telephone Encounter (Signed)
Requested [place] relay message to pt while at Dr Gary Fleet office that she needs to go to pre-admit testing following appt at cancer center today for pre-op prior to surgery scheduled 02/21/17 with arrival time to SDS at 9:00. [place] voices understanding.

## 2017-02-20 NOTE — Progress Notes (Signed)
Patient reports worsening shortness of breath today.

## 2017-02-20 NOTE — Pre-Procedure Instructions (Signed)
Progress Notes Unsigned  Encounter Date: 02/20/2017 Lloyd Huger, MD  Oncology  Expand All Collapse All   [] [facility] copied text  [facility]  Telephone:(336) [phone] [phone]) [phone]  ID: Lin Givens OB: 09-27-1981  MR#: [id number]  HGD#:[id number]  Patient Care Team: Letta Median, MD as PCP - General (Family Medicine) Center, [place] (General Practice)  CHIEF COMPLAINT: Lupus nephritis.  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 She received her initial dose of Cytoxan well in the hospital greater than one month ago. She has had difficulty with transportation and has missed multiple appointments recently. She currently feels well and is asymptomatic. She has no neurologic complaints. She denies any recent fevers or illnesses. She has a good appetite and denies weight loss. She has no chest pain or shortness of breath. She denies any nausea, vomiting, constipation, or diarrhea. She has no urinary complaints. Patient offers no specific complaints today.  REVIEW OF SYSTEMS:   Review of Systems  Constitutional: Negative.  Negative for fever, malaise/fatigue and weight loss.  Respiratory: Negative.  Negative for cough and shortness of breath.   Cardiovascular: Negative.  Negative for chest pain and leg swelling.  Gastrointestinal: Negative.  Negative for abdominal pain.  Genitourinary: Negative.   Neurological: Negative for weakness.  Psychiatric/Behavioral: Negative.  The patient is not nervous/anxious.     As per HPI. Otherwise, a complete review of systems is negative.  PAST MEDICAL HISTORY:     Past Medical History:  Diagnosis Date  . Acute renal failure (ARF) ([place])   . Anemia   . History of kidney stones   . Hypertension    OFF BP MEDS X 3 WEEKS DUE TO BP CONTROL  . 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 Rash    . Staghorn calculus   . 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 IR NEPHROSTOMY PLACEMENT RIGHT  01/23/2017  . NEPHROLITHOTOMY Right 01/24/2017   Procedure: NEPHROLITHOTOMY PERCUTANEOUS;  Surgeon: [name], MD;  Location: [facility] ORS;  Service: Urology;  Laterality: Right;  . TUBAL LIGATION      FAMILY HISTORY:      Family History  Problem Relation Age of Onset  . Thyroid disease Mother   . Thyroid disease Father   . Diabetes Father     ADVANCED DIRECTIVES (Y/N):  N  HEALTH MAINTENANCE:      Social History  Substance Use Topics  . Smoking status: Current Every Day Smoker    Packs/day: 1.00    Years: 15.00    Types: Cigarettes  . Smokeless tobacco: Never Used  . Alcohol use No                Colonoscopy:             PAP:             Bone density:             Lipid panel:       Allergies  Allergen Reactions  . Penicillins     Yeast infection Has patient had a PCN reaction causing immediate rash, facial/tongue/throat swelling, SOB or lightheadedness with hypotension:No Has patient had a PCN reaction causing severe rash involving mucus membranes or skin necrosis: Yes Has patient had a PCN reaction  that required hospitalization No Has patient had a PCN reaction occurring within the last 10 years: No If all of the above answers are "NO", then may proceed with Cephalosporin use.           Current Outpatient Prescriptions  Medication Sig Dispense Refill  . acetaminophen (TYLENOL) 500 MG tablet Take 1-2 tablets (500-1,000 mg total) by mouth every 6 (six) hours as needed for mild pain. 30 tablet 0  . Cyclophosphamide (CYTOXAN LYOPHILIZED IV) Inject 1 Dose into the vein every 30 (thirty) days.    . hydroxychloroquine (PLAQUENIL) 200 MG tablet Take 1 tablet (200 mg total) by mouth daily. (Patient taking differently: Take 200 mg by  mouth every evening. ) 30 tablet 0  . metroNIDAZOLE (METROCREAM) 0.75 % cream Apply topically 2 (two) times daily. 45 g 0  . predniSONE (DELTASONE) 10 MG tablet TAKE 1 TABLET BY MOUTH ONCE A DAY IN THE MORNING  1  . sulfamethoxazole-trimethoprim (BACTRIM DS,SEPTRA DS) 800-160 MG tablet Take 1 tablet by mouth every 12 (twelve) hours. 14 tablet 0  . tamsulosin (FLOMAX) 0.4 MG CAPS capsule Take 1 capsule (0.4 mg total) by mouth daily. 30 capsule 0   No current facility-administered medications for this visit.     OBJECTIVE: There were no vitals filed for this visit.   There is no height or weight on file to calculate BMI.    ECOG FS:0 - Asymptomatic  General: Well-developed, well-nourished, no acute distress. Eyes: Pink conjunctiva, anicteric sclera. HEENT: Normocephalic, moist mucous membranes, clear oropharnyx. Lungs: Clear to auscultation bilaterally. Heart: Regular rate and rhythm. No rubs, murmurs, or gallops. Abdomen: Soft, nontender, nondistended. No organomegaly noted, normoactive bowel sounds. Musculoskeletal: No edema, cyanosis, or clubbing. Neuro: Alert, answering all questions appropriately. Cranial nerves grossly intact. Skin: No rashes or petechiae noted. Psych: Normal affect. Lymphatics: No cervical, calvicular, axillary or inguinal LAD.   LAB RESULTS:  RecentLabs       Lab Results  Component Value Date   NA 139 01/25/2017   K 4.0 01/25/2017   CL 114 (H) 01/25/2017   CO2 22 01/25/2017   GLUCOSE 127 (H) 01/25/2017   BUN 9 01/25/2017   CREATININE 1.25 (H) 01/25/2017   CALCIUM 7.2 (L) 01/25/2017   PROT 5.9 (L) 11/02/2016   ALBUMIN 2.2 (L) 11/02/2016   AST 25 11/02/2016   ALT 19 11/02/2016   ALKPHOS 63 11/02/2016   BILITOT 0.2 (L) 11/02/2016   GFRNONAA 56 (L) 01/25/2017   GFRAA >60 01/25/2017      RecentLabs       Lab Results  Component Value Date   WBC 13.9 (H) 01/25/2017   NEUTROABS 8.3 (H) 12/05/2016   HGB 7.7 (L)  01/25/2017   HCT 25.6 (L) 02/13/2017   MCV 93.3 01/25/2017   PLT 168 01/25/2017       STUDIES:  ImagingResults  Dg Abd 1 View  Result Date: 01/24/2017 CLINICAL DATA:  Nephrostomy. EXAM: ABDOMEN - 1 VIEW; DG C-ARM 61-120 MIN COMPARISON:  Abdominal CT 10/04/2016 FINDINGS: Fluoroscopic images over the right flank show sequential stages of percutaneous nephrostomy lithotomy. On the final 2 images a nephroureteral stent has been removed and internal double-J ureteral stent has been placed. IMPRESSION: Fluoroscopy for right percutaneous nephrostolithotomy. Electronically Signed   By: Monte Fantasia M.D.   On: 01/24/2017 15:21   Dg C-arm 61-120 Min  Result Date: 01/24/2017 CLINICAL DATA:  Nephrostomy. EXAM: ABDOMEN - 1 VIEW; DG C-ARM 61-120 MIN COMPARISON:  Abdominal CT 10/04/2016 FINDINGS: Fluoroscopic images  over the right flank show sequential stages of percutaneous nephrostomy lithotomy. On the final 2 images a nephroureteral stent has been removed and internal double-J ureteral stent has been placed. IMPRESSION: Fluoroscopy for right percutaneous nephrostolithotomy. Electronically Signed   By: Monte Fantasia M.D.   On: 01/24/2017 15:21   Ir Nephrostomy Placement Right  Result Date: 01/23/2017 CLINICAL DATA:  Symptomatic right nephrolithiasis, planned percutaneous nephrolithotomy EXAM: RIGHT PERCUTANEOUS NEPHROURETERAL CATHETER PLACEMENT x2 UNDER FLUOROSCOPIC GUIDANCE FLUOROSCOPY TIME:  11.4 minutes, 75 mGy TECHNIQUE: The procedure, risks (including but not limited to bleeding, infection, organ damage ), benefits, and alternatives were explained to the patient and family. Questions regarding the procedure were encouraged and answered. The patient understands and consents to the procedure. Right flank region prepped , draped in usual sterile fashion, infiltrated locally with 1% lidocaine. Intravenous Fentanyl and Versed were administered as conscious sedation during continuous  cardiorespiratory monitoring by the radiology RN, with a total moderate sedation time of 60 minutes. Under real-time fluoroscopic guidance, a 19 gauge percutaneous entry needle was advanced into a midpole calyx using the peripheral radiodense calculus as a guide. Urine spontaneously returned through the needle. Contrast injection confirmed appropriate positioning. A 035 guidewire advanced into the renal collecting system. The needle was exchanged for a short 5 [place] Kumpe catheter, advanced with the aid of an angled Glidewire easily down the ureter. Tract was dilated to allow placement of a 10 French antegrade nephroureteral catheter. The catheter was placed to external gravity drain bag. With similar technique, a 19 gauge percutaneous entry needle was advanced into a right lower pole calyx using the peripheral radiodense calculus as a guide. Urine spontaneously returned through the needle. A 035 guidewire advanced centrally. A 5 French Kumpe catheter placed. A 035 glidewire advanced down the ureter. There is difficulty advancing the catheter beyond the distal ureter into the urinary bladder despite the use of Amplatz stiff guidewire and coaxial 9 French peel-away sheath, suggesting distal ureteral calculus. A 4 French glide catheter was advanced into the urinary bladder and capped externally. Radiograph confirms appropriate positioning of both nephroureteral catheters . The superior nephroureteral catheter was secured with 0 Prolene suture. The inferior 4 French nephroureteral catheter was secured with Tegaderm. Findings were telephoned to Dr. Erlene Quan. The patient tolerated the procedure well. COMPLICATIONS: COMPLICATIONS None. IMPRESSION: 1. Technically successful RIGHT percutaneous nephroureteral catheter placement x2 as above. Electronically Signed   By: Lucrezia [place] M.D.   On: 01/23/2017 11:58     ASSESSMENT: Lupus nephritis  PLAN:    1. Lupus nephritis: Proceed with 500 mg/m of IV Cytoxan today.  Patient will also receive 10 mg IV Decadron and 0.25 mg Aloxi as premedications. Patient expressed understanding that all of her laboratory work will be monitored by nephrology. If she has any questions or concerns regarding her lupus nephritis or her treatments should be deferred to them as well. Plan is to give 5-6 additional treatments every 28 days. Return to clinic in 1 month for consideration of her next infusion of Cytoxan. 2. Anemia: Mild, likely secondary to chronic renal insufficiency. Monitor.  Approximately 45 minutes was spent in discussion of which greater than 50% was consultation.  Patient expressed understanding and was in agreement with this plan. She also understands that She can call clinic at any time with any questions, concerns, or complaints.   Lloyd Huger, MD   02/19/2017 10:28 PM           Office Visit on 02/20/2017  Detailed Report

## 2017-02-21 ENCOUNTER — Ambulatory Visit
Admission: RE | Admit: 2017-02-21 | Discharge: 2017-02-21 | Disposition: A | Discharge status: Home / Self Care | Payer: Medicaid Other | Source: Ambulatory Visit | Attending: Urology | Admitting: Urology

## 2017-02-21 ENCOUNTER — Ambulatory Visit: Payer: Medicaid Other | Admitting: Anesthesiology

## 2017-02-21 ENCOUNTER — Encounter
Admission: RE | Disposition: A | Discharge status: Home / Self Care | Payer: Self-pay | Source: Ambulatory Visit | Attending: Urology

## 2017-02-21 ENCOUNTER — Encounter: Payer: Self-pay | Admitting: *Deleted

## 2017-02-21 DIAGNOSIS — F17210 Nicotine dependence, cigarettes, uncomplicated: Secondary | ICD-10-CM | POA: Diagnosis not present

## 2017-02-21 DIAGNOSIS — Z79899 Other long term (current) drug therapy: Secondary | ICD-10-CM | POA: Diagnosis not present

## 2017-02-21 DIAGNOSIS — Z87442 Personal history of urinary calculi: Secondary | ICD-10-CM | POA: Diagnosis not present

## 2017-02-21 DIAGNOSIS — M3214 Glomerular disease in systemic lupus erythematosus: Secondary | ICD-10-CM | POA: Diagnosis not present

## 2017-02-21 DIAGNOSIS — N200 Calculus of kidney: Secondary | ICD-10-CM | POA: Insufficient documentation

## 2017-02-21 DIAGNOSIS — N179 Acute kidney failure, unspecified: Secondary | ICD-10-CM | POA: Insufficient documentation

## 2017-02-21 DIAGNOSIS — Z880 Allergy status to penicillin: Secondary | ICD-10-CM | POA: Diagnosis not present

## 2017-02-21 DIAGNOSIS — I10 Essential (primary) hypertension: Secondary | ICD-10-CM | POA: Diagnosis not present

## 2017-02-21 DIAGNOSIS — D62 Acute posthemorrhagic anemia: Secondary | ICD-10-CM | POA: Diagnosis not present

## 2017-02-21 HISTORY — PX: CYSTOSCOPY/URETEROSCOPY/HOLMIUM LASER/STENT PLACEMENT: SHX6546

## 2017-02-21 LAB — URINE DRUG SCREEN, QUALITATIVE (ARMC ONLY)
AMPHETAMINES, UR SCREEN: NOT DETECTED
Barbiturates, Ur Screen: NOT DETECTED
Benzodiazepine, Ur Scrn: NOT DETECTED
COCAINE METABOLITE, UR ~~LOC~~: NOT DETECTED
Cannabinoid 50 Ng, Ur ~~LOC~~: POSITIVE — AB
MDMA (ECSTASY) UR SCREEN: NOT DETECTED
METHADONE SCREEN, URINE: NOT DETECTED
Opiate, Ur Screen: NOT DETECTED
Phencyclidine (PCP) Ur S: NOT DETECTED
TRICYCLIC, UR SCREEN: NOT DETECTED

## 2017-02-21 LAB — POCT PREGNANCY, URINE: PREG TEST UR: NEGATIVE

## 2017-02-21 SURGERY — CYSTOSCOPY/URETEROSCOPY/HOLMIUM LASER/STENT PLACEMENT
Anesthesia: General | Site: Ureter | Laterality: Right | Wound class: Clean Contaminated

## 2017-02-21 MED ORDER — MIDAZOLAM HCL 2 MG/2ML IJ SOLN
INTRAMUSCULAR | Status: AC
  Filled 2017-02-21: qty 2

## 2017-02-21 MED ORDER — PHENYLEPHRINE HCL 10 MG/ML IJ SOLN
INTRAMUSCULAR | Status: DC | PRN
  Administered 2017-02-21 (×4): 100 ug via INTRAVENOUS

## 2017-02-21 MED ORDER — HYDROCODONE-ACETAMINOPHEN 5-325 MG PO TABS: 1.0000 | ORAL_TABLET | Freq: Four times a day (QID) | ORAL | 0 refills | Status: DC | PRN

## 2017-02-21 MED ORDER — PROMETHAZINE HCL 25 MG/ML IJ SOLN: 6.2500 mg | INTRAMUSCULAR | 0 refills | Status: DC | PRN

## 2017-02-21 MED ORDER — ONDANSETRON HCL 4 MG/2ML IJ SOLN
INTRAMUSCULAR | Status: DC | PRN
  Administered 2017-02-21: 4 mg via INTRAVENOUS

## 2017-02-21 MED ORDER — FAMOTIDINE 20 MG PO TABS
20.0000 mg | ORAL_TABLET | Freq: Once | ORAL | Status: AC
  Administered 2017-02-21: 20 mg via ORAL

## 2017-02-21 MED ORDER — SEVOFLURANE IN SOLN
RESPIRATORY_TRACT | Status: AC
  Filled 2017-02-21: qty 250

## 2017-02-21 MED ORDER — PROMETHAZINE HCL 25 MG/ML IJ SOLN
INTRAMUSCULAR | Status: AC
  Administered 2017-02-21: 6.25 mg via INTRAVENOUS
  Filled 2017-02-21: qty 1

## 2017-02-21 MED ORDER — SUCCINYLCHOLINE CHLORIDE 20 MG/ML IJ SOLN
INTRAMUSCULAR | Status: AC
  Filled 2017-02-21: qty 1

## 2017-02-21 MED ORDER — FAMOTIDINE 20 MG PO TABS
ORAL_TABLET | ORAL | Status: AC
  Administered 2017-02-21: 20 mg via ORAL
  Filled 2017-02-21: qty 1

## 2017-02-21 MED ORDER — ROCURONIUM BROMIDE 50 MG/5ML IV SOLN
INTRAVENOUS | Status: AC
  Filled 2017-02-21: qty 1

## 2017-02-21 MED ORDER — HYDROMORPHONE HCL 1 MG/ML IJ SOLN
INTRAMUSCULAR | Status: AC
  Filled 2017-02-21: qty 1

## 2017-02-21 MED ORDER — LACTATED RINGERS IV SOLN
INTRAVENOUS | Status: DC
  Administered 2017-02-21 (×2): via INTRAVENOUS

## 2017-02-21 MED ORDER — ROCURONIUM BROMIDE 100 MG/10ML IV SOLN
INTRAVENOUS | Status: DC | PRN
  Administered 2017-02-21: 10 mg via INTRAVENOUS
  Administered 2017-02-21: 40 mg via INTRAVENOUS
  Administered 2017-02-21: 10 mg via INTRAVENOUS

## 2017-02-21 MED ORDER — SODIUM CHLORIDE 0.9 % IJ SOLN
INTRAMUSCULAR | Status: AC
  Filled 2017-02-21: qty 20

## 2017-02-21 MED ORDER — FENTANYL CITRATE (PF) 100 MCG/2ML IJ SOLN
INTRAMUSCULAR | Status: AC
  Filled 2017-02-21: qty 2

## 2017-02-21 MED ORDER — TAMSULOSIN HCL 0.4 MG PO CAPS: 0.4000 mg | ORAL_CAPSULE | Freq: Every day | ORAL | 0 refills | Status: DC

## 2017-02-21 MED ORDER — SUGAMMADEX SODIUM 200 MG/2ML IV SOLN
INTRAVENOUS | Status: DC | PRN
  Administered 2017-02-21 (×2): 200 mg via INTRAVENOUS

## 2017-02-21 MED ORDER — DOCUSATE SODIUM 100 MG PO CAPS: 100.0000 mg | ORAL_CAPSULE | Freq: Two times a day (BID) | ORAL | 0 refills | Status: DC

## 2017-02-21 MED ORDER — OXYBUTYNIN CHLORIDE 5 MG PO TABS: 5.0000 mg | ORAL_TABLET | Freq: Three times a day (TID) | ORAL | 0 refills | Status: DC | PRN

## 2017-02-21 MED ORDER — PROPOFOL 10 MG/ML IV BOLUS
INTRAVENOUS | Status: DC | PRN
  Administered 2017-02-21: 100 mg via INTRAVENOUS

## 2017-02-21 MED ORDER — LIDOCAINE HCL (CARDIAC) 20 MG/ML IV SOLN
INTRAVENOUS | Status: DC | PRN
  Administered 2017-02-21: 50 mg via INTRAVENOUS

## 2017-02-21 MED ORDER — FENTANYL CITRATE (PF) 100 MCG/2ML IJ SOLN: 25.0000 ug | INTRAMUSCULAR | Status: DC | PRN

## 2017-02-21 MED ORDER — PROPOFOL 10 MG/ML IV BOLUS
INTRAVENOUS | Status: AC
  Filled 2017-02-21: qty 20

## 2017-02-21 MED ORDER — DEXAMETHASONE SODIUM PHOSPHATE 10 MG/ML IJ SOLN
INTRAMUSCULAR | Status: DC | PRN
  Administered 2017-02-21: 10 mg via INTRAVENOUS

## 2017-02-21 MED ORDER — HYDROMORPHONE HCL 1 MG/ML IJ SOLN
INTRAMUSCULAR | Status: DC | PRN
  Administered 2017-02-21 (×2): 0.5 mg via INTRAVENOUS

## 2017-02-21 MED ORDER — HYDROMORPHONE HCL 1 MG/ML IJ SOLN: INTRAMUSCULAR | Status: DC | PRN

## 2017-02-21 MED ORDER — PHENYLEPHRINE HCL 10 MG/ML IJ SOLN: INTRAMUSCULAR | Status: DC | PRN

## 2017-02-21 MED ORDER — IOTHALAMATE MEGLUMINE 43 % IV SOLN
INTRAVENOUS | Status: DC | PRN
  Administered 2017-02-21: 20 mL via URETHRAL

## 2017-02-21 MED ORDER — PROMETHAZINE HCL 25 MG/ML IJ SOLN
6.2500 mg | INTRAMUSCULAR | Status: DC | PRN
  Administered 2017-02-21: 6.25 mg via INTRAVENOUS

## 2017-02-21 MED ORDER — FENTANYL CITRATE (PF) 100 MCG/2ML IJ SOLN
INTRAMUSCULAR | Status: DC | PRN
  Administered 2017-02-21 (×2): 50 ug via INTRAVENOUS

## 2017-02-21 MED ORDER — MIDAZOLAM HCL 2 MG/2ML IJ SOLN
INTRAMUSCULAR | Status: DC | PRN
  Administered 2017-02-21: 2 mg via INTRAVENOUS

## 2017-02-21 MED ORDER — CIPROFLOXACIN IN D5W 400 MG/200ML IV SOLN
INTRAVENOUS | Status: AC
  Filled 2017-02-21: qty 200

## 2017-02-21 SURGICAL SUPPLY — 30 items
BAG DRAIN CYSTO-URO LG1000N (MISCELLANEOUS) ×3 IMPLANT
BASKET ZERO TIP 1.9FR (BASKET) ×3 IMPLANT
BRUSH SCRUB EZ 1% IODOPHOR (MISCELLANEOUS) ×3 IMPLANT
CATH URETL 5X70 OPEN END (CATHETERS) ×3 IMPLANT
CNTNR SPEC 2.5X3XGRAD LEK (MISCELLANEOUS)
CONRAY 43 FOR UROLOGY 50M (MISCELLANEOUS) ×3 IMPLANT
CONT SPEC 4OZ STER OR WHT (MISCELLANEOUS)
CONTAINER SPEC 2.5X3XGRAD LEK (MISCELLANEOUS) IMPLANT
DRAPE UTILITY 15X26 TOWEL STRL (DRAPES) ×3 IMPLANT
FIBER LASER LITHO 273 (Laser) ×3 IMPLANT
GLOVE BIO SURGEON STRL SZ 6.5 (GLOVE) ×2 IMPLANT
GLOVE BIO SURGEONS STRL SZ 6.5 (GLOVE) ×1
GOWN STRL REUS W/ TWL LRG LVL3 (GOWN DISPOSABLE) ×2 IMPLANT
GOWN STRL REUS W/TWL LRG LVL3 (GOWN DISPOSABLE) ×4
GUIDEWIRE SUPER STIFF (WIRE) ×3 IMPLANT
INFUSOR MANOMETER BAG 3000ML (MISCELLANEOUS) ×3 IMPLANT
INTRODUCER DILATOR DOUBLE (INTRODUCER) ×3 IMPLANT
KIT RM TURNOVER CYSTO AR (KITS) ×3 IMPLANT
PACK CYSTO AR (MISCELLANEOUS) ×3 IMPLANT
SENSORWIRE 0.038 NOT ANGLED (WIRE) ×6
SET CYSTO W/LG BORE CLAMP LF (SET/KITS/TRAYS/PACK) ×3 IMPLANT
SHEATH URETERAL 12FRX35CM (MISCELLANEOUS) ×3 IMPLANT
SOL .9 NS 3000ML IRR  AL (IV SOLUTION) ×2
SOL .9 NS 3000ML IRR UROMATIC (IV SOLUTION) ×1 IMPLANT
STENT URET 6FRX22 CONTOUR (STENTS) ×3 IMPLANT
STENT URET 6FRX24 CONTOUR (STENTS) IMPLANT
STENT URET 6FRX26 CONTOUR (STENTS) IMPLANT
SURGILUBE 2OZ TUBE FLIPTOP (MISCELLANEOUS) ×3 IMPLANT
WATER STERILE IRR 1000ML POUR (IV SOLUTION) ×3 IMPLANT
WIRE SENSOR 0.038 NOT ANGLED (WIRE) ×2 IMPLANT

## 2017-02-21 NOTE — H&P (View-Only) (Signed)
02/13/2017 5:16 PM   Patricia Maldonado 1981-02-18 [id number]  Referring provider: Letta Median, MD [name], [place] [address]  Chief Complaint  Patient presents with  . Nephrolithiasis    discuss next procedure    HPI: 36 year old female with newly diagnosed lupus nephritis and a large right staghorn calculus measuring greater than 4 cm 2 underwent multiple access right percutaneous nephrolithotomy on 01/25/2017.  The procedure was uncomplicated other than for some acute blood loss anemia not requiring transfusion. An antegrade double-J ureteral stent was placed. Postoperatively, she's had no issues. She does note that she occasionally feels short of breath when she walks up stairs, otherwise has no complaints. The incision site is clean and intact and no longer draining. She has minimal discomfort from the stent. No fevers or chills.  Approximately 95% of the stone burden was treated. She does have 2 calyces which were not able to be accessed at the time of PCNL. She returns today discussed planned staged ureteroscopy to treat her residual stone burden.  Stone composition consistent with struvite stone, 50% calcium phosphate, 45% magnesium ammonium phosphate, 5% ammonium urate.   PMH: Past Medical History:  Diagnosis Date  . Acute renal failure (ARF) ([place])   . Anemia   . History of kidney stones   . Hypertension    OFF BP MEDS X 3 WEEKS DUE TO BP CONTROL  . 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 Rash   . Staghorn calculus   . 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 IR NEPHROSTOMY PLACEMENT RIGHT  01/23/2017  . NEPHROLITHOTOMY Right 01/24/2017   Procedure: NEPHROLITHOTOMY PERCUTANEOUS;  Surgeon: [name], MD;  Location: [facility] ORS;  Service: Urology;  Laterality: Right;  . TUBAL LIGATION      Home Medications:    Allergies as of 02/13/2017      Reactions   Penicillins    Yeast infection Has patient had a PCN reaction causing immediate rash, facial/tongue/throat swelling, SOB or lightheadedness with hypotension:No Has patient had a PCN reaction causing severe rash involving mucus membranes or skin necrosis: Yes Has patient had a PCN reaction that required hospitalization No Has patient had a PCN reaction occurring within the last 10 years: No If all of the above answers are "NO", then may proceed with Cephalosporin use.      Medication List       Accurate as of 02/13/17  5:16 PM. Always use your most recent med list.          acetaminophen 500 MG tablet Commonly known as:  TYLENOL Take 1-2 tablets (500-1,000 mg total) by mouth every 6 (six) hours as needed for mild pain.   CYTOXAN LYOPHILIZED IV Inject 1 Dose into the vein every 30 (thirty) days.   hydroxychloroquine 200 MG tablet Commonly known as:  PLAQUENIL Take 1 tablet (200 mg total) by mouth daily.   metroNIDAZOLE 0.75 % cream Commonly known as:  METROCREAM Apply topically 2 (two) times daily.   predniSONE 10 MG tablet Commonly known as:  DELTASONE TAKE 1 TABLET BY MOUTH ONCE A DAY IN THE MORNING   sulfamethoxazole-trimethoprim 800-160 MG tablet Commonly known as:  BACTRIM DS,SEPTRA DS Take 1 tablet by mouth every 12 (twelve) hours.   tamsulosin 0.4 MG Caps capsule Commonly known as:  FLOMAX Take 1 capsule (0.4 mg total) by mouth daily.  Allergies:  Allergies  Allergen Reactions  . Penicillins     Yeast infection Has patient had a PCN reaction causing immediate rash, facial/tongue/throat swelling, SOB or lightheadedness with hypotension:No Has patient had a PCN reaction causing severe rash involving mucus membranes or skin necrosis: Yes Has patient had a PCN reaction that required hospitalization No Has patient had a PCN reaction occurring within the last 10 years: No If all of the above answers are "NO",  then may proceed with Cephalosporin use.     Family History: Family History  Problem Relation Age of Onset  . Thyroid disease Mother   . Thyroid disease Father   . Diabetes Father     Social History:  reports that she has been smoking Cigarettes.  She has a 15.00 pack-year smoking history. She has never used smokeless tobacco. She reports that she uses drugs, including Marijuana. She reports that she does not drink alcohol.  ROS: UROLOGY Frequent Urination?: No Hard to postpone urination?: No Burning/pain with urination?: No Get up at night to urinate?: No Leakage of urine?: No Urine stream starts and stops?: No Trouble starting stream?: No Do you have to strain to urinate?: No Blood in urine?: No Urinary tract infection?: No Sexually transmitted disease?: No Injury to kidneys or bladder?: No Painful intercourse?: No Weak stream?: No Currently pregnant?: No Vaginal bleeding?: No Last menstrual period?: n  Gastrointestinal Nausea?: No Vomiting?: No Indigestion/heartburn?: No Diarrhea?: No Constipation?: No  Constitutional Fever: No Night sweats?: Yes Weight loss?: No Fatigue?: No  Skin Skin rash/lesions?: No Itching?: No  Eyes Blurred vision?: No Double vision?: No  Ears/Nose/Throat Sore throat?: No Sinus problems?: No  Hematologic/Lymphatic Swollen glands?: No Easy bruising?: No  Cardiovascular Leg swelling?: No Chest pain?: No  Respiratory Cough?: No Shortness of breath?: Yes  Endocrine Excessive thirst?: Yes  Musculoskeletal Back pain?: No Joint pain?: No  Neurological Headaches?: Yes Dizziness?: Yes  Psychologic Depression?: No Anxiety?: No  Physical Exam: BP 119/77   Pulse 96   Ht 5\' 2"  (1.575 m)   Wt 106 lb (48.1 kg)   LMP 01/20/2017 (Approximate)   BMI 19.39 kg/m   Constitutional:  Alert and oriented, No acute distress. HEENT: [place] AT, moist mucus membranes.  Trachea midline, no masses.  Facial fullness  appreciated. Cardiovascular: No clubbing, cyanosis, or edema. Respiratory: Normal respiratory effort, no increased work of breathing. GI: Abdomen is soft, nontender, nondistended, no abdominal masses GU: Right flank incision well healed 2. Skin: No rashes, bruises or suspicious lesions.. Neurologic: Grossly intact, no focal deficits, moving all 4 extremities. Psychiatric: Normal mood and affect.  Laboratory Data: Lab Results  Component Value Date   WBC 13.9 (H) 01/25/2017   HGB 7.7 (L) 01/25/2017   HCT 23.8 (L) 01/25/2017   MCV 93.3 01/25/2017   PLT 168 01/25/2017    Lab Results  Component Value Date   CREATININE 1.25 (H) 01/25/2017    Urinalysis Preop urine culture obtained today  Pertinent Imaging: No new imaging  Assessment & Plan:    1. Staghorn calculus Status post multiaccess right PCNL addressing approximately 95% of all stone burden Stone analysis reviewed with the patient-consistent with struvite stone  I recommended returning to the operating room for staged procedure to treat all residual stone burden. Risks and benefits of ureteroscopy were reviewed including but not limited to infection, bleeding, pain, ureteral injury which could require open surgery versus prolonged indwelling if ureteralperforation occurs, persistent stone disease, requirement for staged procedure, possible stent, and global anesthesia  risks. Patient expressed understanding and desires to proceed with ureteroscopy.  - CULTURE, URINE COMPREHENSIVE - Hemoglobin and Hematocrit, Blood  2. Lupus nephritis ([place]) Managed by nephrology, creatinine improving  3. Acute blood loss anemia Acute blood loss anemia following PCNL, we'll recheck today preop given her shortness of breath   [name], MD  [facility] [address], [place],  [address] [phone]

## 2017-02-21 NOTE — Discharge Instructions (Signed)
You have a ureteral stent in place.  This is a tube that extends from your kidney to your bladder.  This may cause urinary bleeding, burning with urination, and urinary frequency.  Please call our office or present to the ED if you develop fevers >101 or pain which is not able to be controlled with oral pain medications.  You may be given either Flomax and/ or ditropan to help with bladder spasms and stent pain in addition to pain medications.    Your stent is attached to a string taped to the inside of her left leg. On Friday, untape the stent and pul gently until its removed in entirety. If you have any difficulty, please call our office.  [facility] [address], [place], [place] [address] [phone]      AMBULATORY SURGERY  DISCHARGE INSTRUCTIONS   1) The drugs that you were given will stay in your system until tomorrow so for the next 24 hours you should not:  A) Drive an automobile B) Make any legal decisions C) Drink any alcoholic beverage   2) You may resume regular meals tomorrow.  Today it is better to start with liquids and gradually work up to solid foods.  You may eat anything you prefer, but it is better to start with liquids, then soup and crackers, and gradually work up to solid foods.   3) Please notify your doctor immediately if you have any unusual bleeding, trouble breathing, redness and pain at the surgery site, drainage, fever, or pain not relieved by medication.    4) Additional Instructions:        Please contact your physician with any problems or Same Day Surgery at [phone], Monday through Friday 6 am to 4 pm, or [place] [facility] number at [phone].

## 2017-02-21 NOTE — Op Note (Signed)
Date of procedure: 02/21/17  Preoperative diagnosis:  1. Right nephrolithiasis   Postoperative diagnosis:  1. Right nephrolithiasis   Procedure: 1. Right ureteroscopy  2. Laser lithotripsy 3. Basket extraction of right stone material 4. Right retrograde pyelogram 5. Right ureteral stent exchange  Surgeon: [name], MD  Anesthesia: General  Complications: None  Intraoperative findings: 2 large calyceal stones, one in the mid pole and one in the lower pole treated.  EBL: Minimal   Specimens: None   Drains: 6 x 22 French double-J ureteral stent on right, string left in place   Indication: Gwenn S Litke is a 36 y.o. patient with large right staghorn calculus status post right PCNL at which the majority of the stone burden was treated. She has to calyces full of stones which were unable to be reached at the time of PCNL. She returns today for staged ureteroscopy to treat her residual stone burden..  After reviewing the management options for treatment, she elected to proceed with the above surgical procedure(s). We have discussed the potential benefits and risks of the procedure, side effects of the proposed treatment, the likelihood of the patient achieving the goals of the procedure, and any potential problems that might occur during the procedure or recuperation. Informed consent has been obtained.  Description of procedure:  The patient was taken to the operating room and general anesthesia was induced.  The patient was placed in the dorsal lithotomy position, prepped and draped in the usual sterile fashion, and preoperative antibiotics were administered. A preoperative time-out was performed.   A 21 French scope was advanced per urethra into the bladder. Attention was turned to the right ureteral orifice from which a ureteral stent was seen emanating. The distal coil of the stent was grasped, brought to the level of the urethral meatus. This is then cannulated using a sensor  wire up to level of the kidney. On scout imaging, 2 stones could be seen, one in the mid pole and one in the lower pole measuring approximately 1 cm each. A dual lumen catheter was then used to advance a superstiff wire to the level of the kidney.  The Super Stiff wire was used as a working wire and the sensor wire as the safety wire which was snapped in place. A Cook access sheath, 10/14 was advanced up to the level of the proximal ureter under fluoroscopic guidance was advanced quite easily. The inner cannula was then removed along with the Super Stiff wire. A dual lumen ureteroscope was then advanced into the kidney without difficulty. The midpole stone was then encountered. A 273  laser fiber was then used using very settings including 0.2 J and 40 Hz first dust the stone and later on 1.2 J and 15 Hz to fragment larger pieces. Each of these pieces were then carefully extracted using a 1.9 [place] to plus nitinol basket. Once the stone was cleared, the scope was advanced into the lower pole calyx where the additional stone was seen. A porcine, due to angulation, the stone was somewhat difficult to reach. I exchanged scopes for a smaller more flexible 7 [place] single channel ureteroscope. Attempted to basket the stone into more favorable position but was unsuccessful. Ultimately, with some patient positioning, I was ultimately able to reach the stone and fragment it. The pieces were then extracted using the same nitinol basket. A gentle retrograde pyelogram was performed to the level of the UPJ. This revealed no extravasation. Each and every calyx was then directly visualized.  The scope was then backed down the length of the ureter removing the access sheath along the way. A small fragment was encountered in the distal ureter which was expected via the basket. The scope was then reintroduced back up to level of the mid ureter and withdrawing slowly to ensure that there were no residual stone fragments which were  not appreciated. There was no ureteral injury. The ureter was widely patent. The scope was then removed. A 6 x 22 French double-J ureteral stent was then advanced up to level of the kidney and under fluoroscopic guidance. The wire was partially withdrawn until full coil was noted within the renal pelvis. The wire was then fully withdrawn and a full coil was noted within the bladder. The bladder was then drained using a cystoscope sheath. The string was left attached to the distal coil of the stent. This was affixed to the patient's left inner thigh using Mastisol and Tegaderm. She was then cleaned and dried, repositioned the supine position, reversed from anesthesia, taken to the PACU in stable condition.  Plan: Patient will remove her own stent on Friday. She'll follow up in 4 weeks with a renal or some prior.   [name], M.D.

## 2017-02-21 NOTE — Interval H&P Note (Signed)
History and Physical Interval Note:  02/21/2017 9:48 AM  Patricia Maldonado  has presented today for surgery, with the diagnosis of RIGHT KIDNEY STONE  The various methods of treatment have been discussed with the patient and family. After consideration of risks, benefits and other options for treatment, the patient has consented to  Procedure(s): CYSTOSCOPY/URETEROSCOPY/HOLMIUM LASER/STENT EXCHANGE (Right) as a surgical intervention .  The patient's history has been reviewed, patient examined, no change in status, stable for surgery.  I have reviewed the patient's chart and labs.  Questions were answered to the patient's satisfaction.    RRR CTAB  [name]

## 2017-02-21 NOTE — Transfer of Care (Signed)
2Immediate Anesthesia Transfer of Care Note  Patient: Patricia Maldonado  Procedure(s) Performed: Procedure(s): CYSTOSCOPY/URETEROSCOPY/HOLMIUM LASER/STENT EXCHANGE (Right)  Patient Location: PACU  Anesthesia Type:General  Level of Consciousness: oriented  Airway & Oxygen Therapy: Patient Spontanous Breathing  Post-op Assessment: Report given to RN and Post -op Vital signs reviewed and stable  Post vital signs: Reviewed and stable  Last Vitals:  Vitals:   02/21/17 0858 02/21/17 1311  BP: (!) 142/95 (!) 142/97  Pulse: 81 79  Resp: 18 18  Temp: 36.8 C 36.6 C    Last Pain:  Vitals:   02/21/17 1311  TempSrc:   PainSc: 0-No pain         Complications: No apparent anesthesia complications

## 2017-02-21 NOTE — Anesthesia Procedure Notes (Signed)
Procedure Name: Intubation Date/Time: 02/21/2017 10:13 AM Performed by: [facility] Pre-anesthesia Checklist: Patient identified, Patient being monitored, Timeout performed, Emergency Drugs available and Suction available Patient Re-evaluated:Patient Re-evaluated prior to inductionOxygen Delivery Method: Circle system utilized Preoxygenation: Pre-oxygenation with 100% oxygen Intubation Type: IV induction Ventilation: Mask ventilation without difficulty Laryngoscope Size: Mac and 3 Grade View: Grade I Tube type: Oral Tube size: 7.0 mm Number of attempts: 1 Airway Equipment and Method: Stylet Placement Confirmation: ETT inserted through vocal cords under direct vision,  positive ETCO2 and breath sounds checked- equal and bilateral Secured at: 21 cm Tube secured with: Tape Dental Injury: Teeth and Oropharynx as per pre-operative assessment

## 2017-02-21 NOTE — Anesthesia Post-op Follow-up Note (Cosign Needed)
Anesthesia QCDR form completed.        

## 2017-02-21 NOTE — Anesthesia Postprocedure Evaluation (Signed)
Anesthesia Post Note  Patient: Patricia Maldonado  Procedure(s) Performed: Procedure(s) (LRB): CYSTOSCOPY/URETEROSCOPY/HOLMIUM LASER/STENT EXCHANGE (Right)  Anesthesia Type: General Level of consciousness: awake and alert and oriented Pain management: pain level controlled Vital Signs Assessment: post-procedure vital signs reviewed and stable Respiratory status: spontaneous breathing Cardiovascular status: blood pressure returned to baseline Anesthetic complications: no     Last Vitals:  Vitals:   02/21/17 1415 02/21/17 1428  BP: 125/84 120/78  Pulse: 67 78  Resp: 16 16  Temp: 36.6 C     Last Pain:  Vitals:   02/21/17 1415  TempSrc:   PainSc: 0-No pain                 Threasa Kinch

## 2017-02-21 NOTE — Anesthesia Preprocedure Evaluation (Signed)
Anesthesia Evaluation  Patient identified by MRN, date of birth, ID band Patient awake    Reviewed: Allergy & Precautions, H&P , NPO status , Patient's Chart, lab work & pertinent test results, reviewed documented beta blocker date and time   History of Anesthesia Complications Negative for: history of anesthetic complications  Airway Mallampati: III  TM Distance: >3 FB Neck ROM: full    Dental   Pulmonary shortness of breath and with exertion, neg sleep apnea, neg COPD, neg recent URI, Current Smoker,           Cardiovascular Exercise Tolerance: Good hypertension, (-) angina(-) CAD, (-) Past MI, (-) Cardiac Stents and (-) CABG (-) dysrhythmias (-) Valvular Problems/Murmurs     Neuro/Psych negative neurological ROS  negative psych ROS   GI/Hepatic negative GI ROS, Neg liver ROS,   Endo/Other  negative endocrine ROS  Renal/GU Renal disease (kidney stones)  negative genitourinary   Musculoskeletal   Abdominal   Peds  Hematology  (+) Blood dyscrasia, anemia ,   Anesthesia Other Findings Past Medical History: No date: Acute renal failure (ARF) ([facility]) No date: Anemia     Comment: H/O WITH PREGNANCY No date: History of kidney stones No date: Hypertension     Comment: OFF BP MEDS X 3 WEEKS DUE TO BP CONTROL 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[place])     Comment: DX DURING HOSPITAL ADMISSION No date: Rash No date: Staghorn calculus 10/2016: 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[facility])     Comment: DX DURING HOSPITAL ADMISSION   Reproductive/Obstetrics negative OB ROS                             Anesthesia Physical  Anesthesia Plan  ASA: II  Anesthesia Plan: General   Post-op Pain Management:    Induction: Intravenous  Airway Management Planned: Oral ETT  Additional Equipment:   Intra-op Plan:   Post-operative Plan: Extubation in OR  Informed Consent: I have reviewed the patients History and Physical, chart, labs  and discussed the procedure including the risks, benefits and alternatives for the proposed anesthesia with the patient or authorized representative who has indicated his/her understanding and acceptance.   Dental Advisory Given  Plan Discussed with: Anesthesiologist, CRNA and Surgeon  Anesthesia Plan Comments:         Anesthesia Quick Evaluation

## 2017-02-24 ENCOUNTER — Telehealth: Payer: Self-pay | Admitting: Radiology

## 2017-02-24 NOTE — Telephone Encounter (Signed)
Pt called stating she is nervous to remove ureteral stent. Offered office appt but pt refused. Reassured pt & advised that she remove the stent steadily without jerking to prevent irritation. Pt voices understanding.

## 2017-03-01 ENCOUNTER — Emergency Department: Payer: Medicaid Other

## 2017-03-01 ENCOUNTER — Other Ambulatory Visit: Payer: Self-pay

## 2017-03-01 ENCOUNTER — Inpatient Hospital Stay: Payer: Medicaid Other

## 2017-03-01 ENCOUNTER — Encounter: Payer: Self-pay | Admitting: Emergency Medicine

## 2017-03-01 ENCOUNTER — Inpatient Hospital Stay
Admission: EM | Admit: 2017-03-01 | Discharge: 2017-03-03 | DRG: 176 | Disposition: A | Discharge status: Home / Self Care | Payer: Medicaid Other | Attending: Internal Medicine | Admitting: Internal Medicine

## 2017-03-01 ENCOUNTER — Inpatient Hospital Stay (HOSPITAL_COMMUNITY)
Admit: 2017-03-01 | Discharge: 2017-03-01 | Disposition: A | Discharge status: Home / Self Care | Payer: Medicaid Other | Attending: Internal Medicine | Admitting: Internal Medicine

## 2017-03-01 DIAGNOSIS — I10 Essential (primary) hypertension: Secondary | ICD-10-CM | POA: Diagnosis present

## 2017-03-01 DIAGNOSIS — M3214 Glomerular disease in systemic lupus erythematosus: Secondary | ICD-10-CM | POA: Diagnosis present

## 2017-03-01 DIAGNOSIS — N200 Calculus of kidney: Secondary | ICD-10-CM

## 2017-03-01 DIAGNOSIS — R609 Edema, unspecified: Secondary | ICD-10-CM

## 2017-03-01 DIAGNOSIS — N189 Chronic kidney disease, unspecified: Secondary | ICD-10-CM | POA: Diagnosis present

## 2017-03-01 DIAGNOSIS — D649 Anemia, unspecified: Secondary | ICD-10-CM | POA: Diagnosis present

## 2017-03-01 DIAGNOSIS — R0600 Dyspnea, unspecified: Secondary | ICD-10-CM | POA: Diagnosis not present

## 2017-03-01 DIAGNOSIS — E041 Nontoxic single thyroid nodule: Secondary | ICD-10-CM

## 2017-03-01 DIAGNOSIS — R079 Chest pain, unspecified: Secondary | ICD-10-CM

## 2017-03-01 DIAGNOSIS — I2699 Other pulmonary embolism without acute cor pulmonale: Principal | ICD-10-CM

## 2017-03-01 DIAGNOSIS — F17210 Nicotine dependence, cigarettes, uncomplicated: Secondary | ICD-10-CM | POA: Diagnosis not present

## 2017-03-01 DIAGNOSIS — Z79899 Other long term (current) drug therapy: Secondary | ICD-10-CM | POA: Diagnosis not present

## 2017-03-01 LAB — URINALYSIS, ROUTINE W REFLEX MICROSCOPIC
BILIRUBIN URINE: NEGATIVE
Glucose, UA: NEGATIVE mg/dL
Ketones, ur: NEGATIVE mg/dL
NITRITE: NEGATIVE
PROTEIN: 100 mg/dL — AB
Specific Gravity, Urine: 1.014 (ref 1.005–1.030)
pH: 6 (ref 5.0–8.0)

## 2017-03-01 LAB — COMPREHENSIVE METABOLIC PANEL
ALBUMIN: 3.3 g/dL — AB (ref 3.5–5.0)
ALK PHOS: 53 U/L (ref 38–126)
ALT: 9 U/L — AB (ref 14–54)
AST: 14 U/L — ABNORMAL LOW (ref 15–41)
Anion gap: 6 (ref 5–15)
BUN: 12 mg/dL (ref 6–20)
CHLORIDE: 107 mmol/L (ref 101–111)
CO2: 23 mmol/L (ref 22–32)
CREATININE: 1.2 mg/dL — AB (ref 0.44–1.00)
Calcium: 8.8 mg/dL — ABNORMAL LOW (ref 8.9–10.3)
GFR calc non Af Amer: 59 mL/min — ABNORMAL LOW (ref >60)
GLUCOSE: 91 mg/dL (ref 65–99)
Potassium: 3.6 mmol/L (ref 3.5–5.1)
SODIUM: 136 mmol/L (ref 135–145)
Total Bilirubin: 0.4 mg/dL (ref 0.3–1.2)
Total Protein: 7.3 g/dL (ref 6.5–8.1)

## 2017-03-01 LAB — FIBRIN DERIVATIVES D-DIMER (ARMC ONLY): FIBRIN DERIVATIVES D-DIMER (ARMC): 4683.41 — AB (ref 0.00–499.00)

## 2017-03-01 LAB — LIPASE, BLOOD: Lipase: 18 U/L (ref 11–51)

## 2017-03-01 LAB — CBC
HCT: 26.1 % — ABNORMAL LOW (ref 35.0–47.0)
Hemoglobin: 8.5 g/dL — ABNORMAL LOW (ref 12.0–16.0)
MCH: 28.6 pg (ref 26.0–34.0)
MCHC: 32.7 g/dL (ref 32.0–36.0)
MCV: 87.7 fL (ref 80.0–100.0)
PLATELETS: 266 10*3/uL (ref 150–440)
RBC: 2.98 MIL/uL — AB (ref 3.80–5.20)
RDW: 15.4 % — ABNORMAL HIGH (ref 11.5–14.5)
WBC: 6.4 10*3/uL (ref 3.6–11.0)

## 2017-03-01 LAB — PROTIME-INR
INR: 0.98
Prothrombin Time: 13 seconds (ref 11.4–15.2)

## 2017-03-01 LAB — TROPONIN I

## 2017-03-01 LAB — HEPARIN LEVEL (UNFRACTIONATED): HEPARIN UNFRACTIONATED: 0.26 [IU]/mL — AB (ref 0.30–0.70)

## 2017-03-01 LAB — APTT: APTT: 31 s (ref 24–36)

## 2017-03-01 MED ORDER — HYDROCODONE-ACETAMINOPHEN 5-325 MG PO TABS
1.0000 | ORAL_TABLET | Freq: Four times a day (QID) | ORAL | Status: DC | PRN
  Administered 2017-03-01 – 2017-03-02 (×5): 2 via ORAL
  Filled 2017-03-01 (×7): qty 2

## 2017-03-01 MED ORDER — HYDROXYCHLOROQUINE SULFATE 200 MG PO TABS
200.0000 mg | ORAL_TABLET | Freq: Every day | ORAL | Status: DC
  Administered 2017-03-01 – 2017-03-03 (×3): 200 mg via ORAL
  Filled 2017-03-01 (×3): qty 1

## 2017-03-01 MED ORDER — ONDANSETRON HCL 4 MG/2ML IJ SOLN: 4.0000 mg | Freq: Four times a day (QID) | INTRAMUSCULAR | Status: DC | PRN

## 2017-03-01 MED ORDER — TAMSULOSIN HCL 0.4 MG PO CAPS
0.4000 mg | ORAL_CAPSULE | Freq: Every day | ORAL | Status: DC
  Administered 2017-03-01 – 2017-03-03 (×3): 0.4 mg via ORAL
  Filled 2017-03-01 (×3): qty 1

## 2017-03-01 MED ORDER — MORPHINE SULFATE (PF) 4 MG/ML IV SOLN
4.0000 mg | Freq: Once | INTRAVENOUS | Status: DC
  Filled 2017-03-01: qty 1

## 2017-03-01 MED ORDER — AMLODIPINE BESYLATE 5 MG PO TABS
5.0000 mg | ORAL_TABLET | Freq: Every day | ORAL | Status: DC
  Administered 2017-03-01 – 2017-03-03 (×3): 5 mg via ORAL
  Filled 2017-03-01 (×3): qty 1

## 2017-03-01 MED ORDER — HEPARIN BOLUS VIA INFUSION
3000.0000 [IU] | Freq: Once | INTRAVENOUS | Status: AC
  Administered 2017-03-01: 3000 [IU] via INTRAVENOUS
  Filled 2017-03-01: qty 3000

## 2017-03-01 MED ORDER — ONDANSETRON HCL 4 MG/2ML IJ SOLN
4.0000 mg | Freq: Once | INTRAMUSCULAR | Status: AC
  Administered 2017-03-01: 4 mg via INTRAVENOUS
  Filled 2017-03-01: qty 2

## 2017-03-01 MED ORDER — LEVOFLOXACIN IN D5W 750 MG/150ML IV SOLN
750.0000 mg | Freq: Once | INTRAVENOUS | Status: AC
  Administered 2017-03-01: 750 mg via INTRAVENOUS
  Filled 2017-03-01: qty 150

## 2017-03-01 MED ORDER — HEPARIN (PORCINE) IN NACL 100-0.45 UNIT/ML-% IJ SOLN
800.0000 [IU]/h | INTRAMUSCULAR | Status: DC
  Administered 2017-03-01: 700 [IU]/h via INTRAVENOUS
  Filled 2017-03-01 (×2): qty 250

## 2017-03-01 MED ORDER — SODIUM CHLORIDE 0.9 % IV SOLN
INTRAVENOUS | Status: DC
  Administered 2017-03-01 – 2017-03-03 (×4): via INTRAVENOUS

## 2017-03-01 MED ORDER — HEPARIN BOLUS VIA INFUSION
1000.0000 [IU] | Freq: Once | INTRAVENOUS | Status: AC
  Administered 2017-03-01: 1000 [IU] via INTRAVENOUS
  Filled 2017-03-01: qty 1000

## 2017-03-01 MED ORDER — IOPAMIDOL (ISOVUE-370) INJECTION 76%
75.0000 mL | Freq: Once | INTRAVENOUS | Status: AC | PRN
  Administered 2017-03-01: 75 mL via INTRAVENOUS

## 2017-03-01 MED ORDER — MORPHINE SULFATE (PF) 2 MG/ML IV SOLN
2.0000 mg | Freq: Once | INTRAVENOUS | Status: AC
Start: 2017-03-01 — End: 2017-03-01
  Administered 2017-03-01: 2 mg via INTRAVENOUS
  Filled 2017-03-01: qty 1

## 2017-03-01 MED ORDER — ACETAMINOPHEN 500 MG PO TABS: 500.0000 mg | ORAL_TABLET | Freq: Four times a day (QID) | ORAL | Status: DC | PRN

## 2017-03-01 MED ORDER — ONDANSETRON HCL 4 MG PO TABS: 4.0000 mg | ORAL_TABLET | Freq: Four times a day (QID) | ORAL | Status: DC | PRN

## 2017-03-01 MED ORDER — DOCUSATE SODIUM 100 MG PO CAPS
100.0000 mg | ORAL_CAPSULE | Freq: Two times a day (BID) | ORAL | Status: DC
  Administered 2017-03-01 – 2017-03-03 (×5): 100 mg via ORAL
  Filled 2017-03-01 (×5): qty 1

## 2017-03-01 MED ORDER — OXYBUTYNIN CHLORIDE 5 MG PO TABS: 5.0000 mg | ORAL_TABLET | Freq: Three times a day (TID) | ORAL | Status: DC | PRN

## 2017-03-01 NOTE — Progress Notes (Addendum)
ANTICOAGULATION CONSULT NOTE - Follow up [facility] for Heparin Indication: pulmonary embolus  Allergies  Allergen Reactions  . Penicillins     Yeast infection Has patient had a PCN reaction causing immediate rash, facial/tongue/throat swelling, SOB or lightheadedness with hypotension:No Has patient had a PCN reaction causing severe rash involving mucus membranes or skin necrosis: Yes Has patient had a PCN reaction that required hospitalization No Has patient had a PCN reaction occurring within the last 10 years: No If all of the above answers are "NO", then may proceed with Cephalosporin use.     Patient Measurements: Height: 5\' 2"  (157.5 cm) Weight: 106 lb (48.1 kg) IBW/kg (Calculated) : 50.1 Heparin Dosing Weight: 48.1 kg  Vital Signs: Temp: 98.2 F (36.8 C) (05/30 1332) Temp Source: Oral (05/30 1332) BP: 138/90 (05/30 1332) Pulse Rate: 91 (05/30 1332)  Labs:  Recent Labs  03/01/17 0800 03/01/17 1238 03/01/17 1908  HGB 8.5*  --   --   HCT 26.1*  --   --   PLT 266  --   --   APTT  --  31  --   LABPROT  --  13.0  --   INR  --  0.98  --   HEPARINUNFRC  --   --  0.26*  CREATININE 1.20*  --   --   TROPONINI <0.03  --   --     Estimated Creatinine Clearance: 50.6 mL/min (A) (by C-G formula based on SCr of 1.2 mg/dL (H)).   Medical History: Past Medical History:  Diagnosis Date  . Acute renal failure (ARF) ([place])   . Anemia   . History of kidney stones   . Hypertension    OFF BP MEDS X 3 WEEKS DUE TO BP CONTROL  . 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 Rash   . Staghorn calculus   . 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[place]) 10/2016   DX DURING HOSPITAL ADMISSION    Medications:  Infusions:  . sodium chloride 75 mL/hr at 03/01/17 1433  . heparin 700 Units/hr (03/01/17 1255)    Assessment: 36 yo female admitted with CT confirmed pulmonary embolism.   Goal of Therapy:  Heparin level 0.3-0.7 units/ml Monitor platelets by  anticoagulation protocol: Yes   Plan:  Give 3000 units bolus x 1 Start heparin infusion at 700 units/hr Check anti-Xa level in 6 hours and daily while on heparin Continue to monitor H&H and platelets   5/30 1908 - heparin level 0.26 - subtherapeutic; will bolus heparin 1000 units x1 then increase heparin drip to 800 units/hr (=8 ml/hr). Heparin level 6h after rate change, CBC in AM.  Per RN no line issues or s/sx of bleeding noted given in report.   5/31 03:00 heparin level 0.35. Continue current regimen. Recheck in 6 hours to confirm.  Pharmacy will continue to follow.  Sim Boast, PharmD, BCPS  03/02/17 4:26 AM

## 2017-03-01 NOTE — ED Provider Notes (Signed)
[facility] Emergency Department Provider Note  Time seen: 7:50 AM  I have reviewed the triage vital signs and the nursing notes.   HISTORY  Chief Complaint Shoulder Pain; Chest Pain; and Abdominal Pain    HPI Patricia Maldonado is a 36 y.o. female with a past medical history of kidney stones, hypertension, lupus, presents to the emergency department for right-sided abdominal/chest pain. According to the patient for the past 2 days she has been experiencing significant right sided abdominal and chest pain. States the pain is worse if she coughs or lies down on her back. Denies any leg pain or swelling. Denies any nausea vomiting or diarrhea. Denies any dysuria or hematuria. Patient recently had a kidney stone removed last month. Denies any complications from the procedure, denies any dysuria or hematuria since. Denies any fever. Patient states the pain is moderate at all times but severe if she coughs takes a deep breath or lies on her back.Describes the pain as sharp.  Past Medical History:  Diagnosis Date  . Acute renal failure (ARF) ([place])   . Anemia   . History of kidney stones   . Hypertension    OFF BP MEDS X 3 WEEKS DUE TO BP CONTROL  . 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 Rash   . Staghorn calculus   . 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[place]) 10/2016   [place] ADMISSION    Patient Active Problem List   Diagnosis Date Noted  . Kidney stone on left side 01/23/2017  . Systemic lupus erythematosus ([place]) 12/08/2016  . Encounter for long-term (current) use of high-risk medication 12/08/2016  . 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 Acute renal failure (ARF) ([place]) 10/04/2016  . Nephrolithiasis   . Staghorn calculus   . Acute renal failure with tubular necrosis ([place]) 08/30/2016    Past Surgical History:  Procedure Laterality Date  . CYSTOSCOPY/URETEROSCOPY/HOLMIUM LASER/STENT PLACEMENT Right 02/21/2017   Procedure: CYSTOSCOPY/URETEROSCOPY/HOLMIUM LASER/STENT EXCHANGE;  Surgeon: [name], MD;  Location: [facility] ORS;  Service: Urology;  Laterality: Right;  . IR NEPHROSTOMY PLACEMENT RIGHT  01/23/2017  . NEPHROLITHOTOMY Right 01/24/2017   Procedure: NEPHROLITHOTOMY PERCUTANEOUS;  Surgeon: [name], MD;  Location: [facility] ORS;  Service: Urology;  Laterality: Right;  . TUBAL LIGATION      Prior to Admission medications   Medication Sig Start Date End Date Taking? Authorizing Provider  acetaminophen (TYLENOL) 500 MG tablet Take 1-2 tablets (500-1,000 mg total) by mouth every 6 (six) hours as needed for mild pain. 10/12/16   Max Sane, MD  amLODipine (NORVASC) 5 MG tablet Take 5 mg by mouth daily.    [provider]  Cyclophosphamide (CYTOXAN LYOPHILIZED IV) Inject 1 Dose into the vein every 30 (thirty) days.    [provider]  docusate sodium (COLACE) 100 MG capsule Take 1 capsule (100 mg total) by mouth 2 (two) times daily. 02/21/17   [name], MD  HYDROcodone-acetaminophen (NORCO/VICODIN) 5-325 MG tablet Take 1-2 tablets by mouth every 6 (six) hours as needed for moderate pain. 02/21/17   [name], MD  hydroxychloroquine (PLAQUENIL) 200 MG tablet Take 1 tablet (200 mg total) by mouth daily. Patient taking differently: Take 200 mg by mouth every evening.  10/12/16   Max Sane, MD  metroNIDAZOLE (METROCREAM) 0.75 % cream Apply topically 2 (two) times daily. Patient not taking: Reported on 02/21/2017 09/04/16   Dustin Flock, MD  oxybutynin (DITROPAN) 5 MG tablet Take 1  tablet (5 mg total) by mouth every 8 (eight) hours as needed for bladder spasms. 02/21/17   [name], MD  predniSONE (DELTASONE) 10 MG tablet TAKE 1 TABLET BY MOUTH ONCE A DAY IN THE MORNING 12/02/16   [provider]  promethazine (PHENERGAN) 25 MG/ML injection Inject 0.25-0.5 mLs (6.25-12.5 mg total) into the vein every 15 (fifteen) minutes as needed for nausea. 02/21/17   [name], MD  sulfamethoxazole-trimethoprim (BACTRIM DS,SEPTRA DS) 800-160 MG tablet Take 1 tablet by mouth every 12 (twelve) hours. 01/20/17   [name], MD  tamsulosin (FLOMAX) 0.4 MG CAPS capsule Take 1 capsule (0.4 mg total) by mouth daily. 02/21/17   [name], MD    Allergies  Allergen Reactions  . Penicillins     Yeast infection Has patient had a PCN reaction causing immediate rash, facial/tongue/throat swelling, SOB or lightheadedness with hypotension:No Has patient had a PCN reaction causing severe rash involving mucus membranes or skin necrosis: Yes Has patient had a PCN reaction that required hospitalization No Has patient had a PCN reaction occurring within the last 10 years: No If all of the above answers are "NO", then may proceed with Cephalosporin use.     Family History  Problem Relation Age of Onset  . Thyroid disease Mother   . Thyroid disease Father   . Diabetes Father     Social History Social History  Substance Use Topics  . Smoking status: Current Every Day Smoker    Packs/day: 1.00    Years: 15.00    Types: Cigarettes  . Smokeless tobacco: Never Used  . Alcohol use No    Review of Systems Constitutional: Negative for fever. Cardiovascular: Right chest pain Respiratory: Negative for shortness of breath. Occasional cough. Gastrointestinal: Right-sided abdominal pain. Negative for nausea vomiting or diarrhea. Genitourinary: Negative for dysuria. Negative for hematuria Musculoskeletal: Negative for back pain. Skin: Negative for rash. Neurological: Negative for headache All other ROS negative  ____________________________________________   PHYSICAL EXAM:  VITAL SIGNS: ED Triage Vitals  Enc Vitals Group     BP 03/01/17 0734 (!) 128/94     Pulse Rate 03/01/17 0734 (!) 101     Resp 03/01/17 0734 18     Temp 03/01/17 0734 98.5 F (36.9 C)     Temp Source 03/01/17 0734 Oral     SpO2 03/01/17 0734 100 %     Weight 03/01/17 0735 106 lb  (48.1 kg)     Height 03/01/17 0735 5\' 2"  (1.575 m)     Head Circumference --      Peak Flow --      Pain Score 03/01/17 0734 9     Pain Loc --      Pain Edu? --      Excl. in [place]? --     Constitutional: Alert and oriented. Tearful at times. Eyes: Normal exam ENT   Head: Normocephalic and atraumatic.   Mouth/Throat: Mucous membranes are moist. Cardiovascular: Normal rate, regular rhythm. No murmur Respiratory: Normal respiratory effort without tachypnea nor retractions. Breath sounds are clear. Nontender chest. Gastrointestinal: Soft and nontender. No distention. No tenderness to palpation. Musculoskeletal: Nontender with normal range of motion in all extremities. No lower extremity tenderness or edema. Neurologic:  Normal speech and language. No gross focal neurologic deficits  Skin:  Skin is warm, dry and intact.  Psychiatric: Mood and affect are normal.   ____________________________________________    EKG  EKG reviewed and interpreted by myself shows normal sinus rhythm at 93  bpm, narrow QRS, normal axis, normal intervals, nonspecific ST changes.  ____________________________________________    RADIOLOGY  CT angiography positive for PE.  ____________________________________________   INITIAL IMPRESSION / ASSESSMENT AND PLAN / ED COURSE  Pertinent labs & imaging results that were available during my care of the patient were reviewed by me and considered in my medical decision making (see chart for details).  Patient presents to the emergency department with 2 days of right-sided chest/abdominal pain. Patient states pain is worse when she lies flat or coughs. Patient has a nontender exam. Clear lung sounds. 100% room air saturation, no leg pain or swelling. Patient is slightly tachycardic around 100 bpm but she is tearful currently. We will check labs, chest x-ray and continue to closely monitor in the emergency department.  Chest x-ray shows likely pneumonia. We  will cover with Levaquin and send blood cultures. Patient's d-dimer significantly elevated we'll also proceed with a CT angiography to further evaluate.  Patient CT angiography is positive for right lower lobe pulmonary embolism. We will start the patient on heparin and admitted to the hospital for further treatment.  CRITICAL CARE Performed by: Harvest Dark   Total critical care time: 30 minutes  Critical care time was exclusive of separately billable procedures and treating other patients.  Critical care was necessary to treat or prevent imminent or life-threatening deterioration.  Critical care was time spent personally by me on the following activities: development of treatment plan with patient and/or surrogate as well as nursing, discussions with consultants, evaluation of patient's response to treatment, examination of patient, obtaining history from patient or surrogate, ordering and performing treatments and interventions, ordering and review of laboratory studies, ordering and review of radiographic studies, pulse oximetry and re-evaluation of patient's condition.   ____________________________________________   FINAL CLINICAL IMPRESSION(S) / ED DIAGNOSES  right abdominal pain right chest pain Pulmonary embolism   Harvest Dark, MD 03/01/17 1036

## 2017-03-01 NOTE — H&P (Signed)
[place] [facility] [place] NAME: Patricia Maldonado    MR#:  [id number]  DATE OF BIRTH:  11/16/1980  DATE OF ADMISSION:  03/01/2017  PRIMARY CARE PHYSICIAN: Letta Median, MD   REQUESTING/REFERRING PHYSICIAN: Harvest Dark, MD  CHIEF COMPLAINT:   Chief Complaint  Patient presents with  . Shoulder Pain  . Chest Pain  . Abdominal Pain   HISTORY OF PRESENT ILLNESS:  Patricia Maldonado  is a 36 y.o. female with a known history of kidney stones, hypertension, lupus, presents to the emergency department for right-sided abdominal/chest pain. According to the patient for the past 2 days she has been experiencing significant right sided abdominal and chest pain. States the pain is worse if she coughs or lies down on her back. Patient recently had a kidney stone removed last month. Denies any complications from the procedure, denies any dysuria or hematuria since. Denies any fever. Patient states the pain is moderate at all times but severe if she coughs takes a deep breath or lies on her back.Describes the pain as sharp. Underwent CT angio which showed acute PE for which she is started on heparin drip and is being admitted for further eval and mgmt. PAST MEDICAL HISTORY:   Past Medical History:  Diagnosis Date  . Acute renal failure (ARF) ([place])   . Anemia   . History of kidney stones   . Hypertension    OFF BP MEDS X 3 WEEKS DUE TO BP CONTROL  . 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 Rash   . Staghorn calculus   . 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 CYSTOSCOPY/URETEROSCOPY/HOLMIUM LASER/STENT PLACEMENT Right 02/21/2017   Procedure: CYSTOSCOPY/URETEROSCOPY/HOLMIUM LASER/STENT EXCHANGE;  Surgeon: [name], MD;  Location: [facility] ORS;  Service: Urology;  Laterality: Right;  . IR NEPHROSTOMY PLACEMENT RIGHT   01/23/2017  . NEPHROLITHOTOMY Right 01/24/2017   Procedure: NEPHROLITHOTOMY PERCUTANEOUS;  Surgeon: [name], MD;  Location: [facility] ORS;  Service: Urology;  Laterality: Right;  . TUBAL LIGATION      SOCIAL HISTORY:   Social History  Substance Use Topics  . Smoking status: Current Every Day Smoker    Packs/day: 1.00    Years: 15.00    Types: Cigarettes  . Smokeless tobacco: Never Used  . Alcohol use No    FAMILY HISTORY:   Family History  Problem Relation Age of Onset  . Thyroid disease Mother   . Thyroid disease Father   . Diabetes Father     DRUG ALLERGIES:   Allergies  Allergen Reactions  . Penicillins     Yeast infection Has patient had a PCN reaction causing immediate rash, facial/tongue/throat swelling, SOB or lightheadedness with hypotension:No Has patient had a PCN reaction causing severe rash involving mucus membranes or skin necrosis: Yes Has patient had a PCN reaction that required hospitalization No Has patient had a PCN reaction occurring within the last 10 years: No If all of the above answers are "NO", then may proceed with Cephalosporin use.     REVIEW OF SYSTEMS:   Review of Systems  Constitutional: Negative for chills, fever and weight loss.  HENT: Negative for nosebleeds and sore throat.   Eyes: Negative for blurred vision.  Respiratory: Negative for cough, shortness of breath and wheezing.   Cardiovascular: Positive for chest pain. Negative for orthopnea, leg  swelling and PND.  Gastrointestinal: Positive for abdominal pain. Negative for constipation, diarrhea, heartburn, nausea and vomiting.  Genitourinary: Negative for dysuria and urgency.  Musculoskeletal: Positive for joint pain. Negative for back pain.  Skin: Negative for rash.  Neurological: Negative for dizziness, speech change, focal weakness and headaches.  Endo/Heme/Allergies: Does not bruise/bleed easily.  Psychiatric/Behavioral: Negative for depression.    MEDICATIONS AT HOME:     Prior to Admission medications   Medication Sig Start Date End Date Taking? Authorizing Provider  amLODipine (NORVASC) 5 MG tablet Take 5 mg by mouth daily.   Yes [provider]  Cyclophosphamide (CYTOXAN LYOPHILIZED IV) Inject 1 Dose into the vein every 30 (thirty) days.   Yes [provider]  hydroxychloroquine (PLAQUENIL) 200 MG tablet Take 1 tablet (200 mg total) by mouth daily. Patient taking differently: Take 200 mg by mouth every evening.  10/12/16  Yes Max Sane, MD  predniSONE (DELTASONE) 10 MG tablet TAKE 1 TABLET BY MOUTH ONCE A DAY IN THE MORNING 12/02/16  Yes [provider]  sulfamethoxazole-trimethoprim (BACTRIM DS,SEPTRA DS) 800-160 MG tablet Take 1 tablet by mouth every 12 (twelve) hours. 01/20/17  Yes [name], MD  tamsulosin (FLOMAX) 0.4 MG CAPS capsule Take 1 capsule (0.4 mg total) by mouth daily. 02/21/17  Yes [name], MD  acetaminophen (TYLENOL) 500 MG tablet Take 1-2 tablets (500-1,000 mg total) by mouth every 6 (six) hours as needed for mild pain. 10/12/16   Max Sane, MD  docusate sodium (COLACE) 100 MG capsule Take 1 capsule (100 mg total) by mouth 2 (two) times daily. 02/21/17   [name], MD  HYDROcodone-acetaminophen (NORCO/VICODIN) 5-325 MG tablet Take 1-2 tablets by mouth every 6 (six) hours as needed for moderate pain. 02/21/17   [name], MD  oxybutynin (DITROPAN) 5 MG tablet Take 1 tablet (5 mg total) by mouth every 8 (eight) hours as needed for bladder spasms. 02/21/17   [name], MD      VITAL SIGNS:  Blood pressure 120/88, pulse 95, temperature 98.5 F (36.9 C), temperature source Oral, resp. rate (!) 27, height 5\' 2"  (1.575 m), weight 48.1 kg (106 lb), last menstrual period 02/18/2017, SpO2 100 %.  PHYSICAL EXAMINATION:  Physical Exam  GENERAL:  36 y.o.-year-old patient lying in the bed with no acute distress.  EYES: Pupils equal, round, reactive to light and accommodation. No scleral icterus.  Extraocular muscles intact.  HEENT: Head atraumatic, normocephalic. Oropharynx and nasopharynx clear.  NECK:  Supple, no jugular venous distention. No thyroid enlargement, no tenderness.  LUNGS: Normal breath sounds bilaterally, no wheezing, rales,rhonchi or crepitation. No use of accessory muscles of respiration.  CARDIOVASCULAR: S1, S2 normal. No murmurs, rubs, or gallops.  ABDOMEN: Soft, nontender, nondistended. Bowel sounds present. No organomegaly or mass.  EXTREMITIES: No pedal edema, cyanosis, or clubbing.  NEUROLOGIC: Cranial nerves II through XII are intact. Muscle strength 5/5 in all extremities. Sensation intact. Gait not checked.  PSYCHIATRIC: The patient is alert and oriented x 3.  SKIN: No obvious rash, lesion, or ulcer.  LABORATORY PANEL:   CBC  Recent Labs Lab 03/01/17 0800  WBC 6.4  HGB 8.5*  HCT 26.1*  PLT 266   ------------------------------------------------------------------------------------------------------------------  Chemistries   Recent Labs Lab 03/01/17 0800  NA 136  K 3.6  CL 107  CO2 23  GLUCOSE 91  BUN 12  CREATININE 1.20*  CALCIUM 8.8*  AST 14*  ALT 9*  ALKPHOS 53  BILITOT 0.4   ------------------------------------------------------------------------------------------------------------------  Cardiac Enzymes  Recent Labs Lab 03/01/17 0800  TROPONINI <0.03   ------------------------------------------------------------------------------------------------------------------  RADIOLOGY:  Dg Chest 2 View  Result Date: 03/01/2017 CLINICAL DATA:  Right chest and abdominal pain, recent right percutaneous nephrolithotomy for stone removal. History of hypertension, lupus, and smoking. EXAM: CHEST  2 VIEW COMPARISON:  None available FINDINGS: Ill-defined right basilar opacity partial obscures the right hemidiaphragm compatible with right basilar atelectasis/airspace disease. Basilar pneumonia not excluded. Left lung remains clear. Normal  heart size and vascularity. No significant effusion, edema, or pneumothorax. Calcified granuloma suspected in the left upper lobe. Trachea is midline. No osseous abnormality. Normal bowel gas pattern. IMPRESSION: Right basilar atelectasis versus pneumonia. Electronically Signed   By: Jerilynn Mages.  Shick M.D.   On: 03/01/2017 08:37   Ct Angio Chest Pe W Or Wo Contrast  Result Date: 03/01/2017 CLINICAL DATA:  Right-sided chest pain.  History of lupus. EXAM: CT ANGIOGRAPHY CHEST WITH CONTRAST TECHNIQUE: Multidetector CT imaging of the chest was performed using the standard protocol during bolus administration of intravenous contrast. Multiplanar CT image reconstructions and MIPs were obtained to evaluate the vascular anatomy. CONTRAST:  75 cc Isovue 370 intravenous COMPARISON:  None. FINDINGS: Cardiovascular: Central branching pulmonary artery filling defect in the lateral and posterior basal segments of the right lower lobe consistent with acute pulmonary embolism. There is cardiomegaly. Normal RV to LV ratio. No acute aortic finding. No pericardial effusion Mediastinum/Nodes: 15 mm right thyroid nodule. Lungs/Pleura: Ground-glass density in the right lower lobe with trace effusion. The left lung is clear. Upper Abdomen: No acute finding Musculoskeletal: No acute or aggressive finding. Critical Value/emergent results were called by telephone at the time of interpretation on 03/01/2017 at 10:31 am to Dr. Harvest Dark , who verbally acknowledged these results. Review of the MIP images confirms the above findings. IMPRESSION: 1. Positive for acute pulmonary embolism affecting 2 segmental branches in the right lower lobe. Right lower lobe infarct and atelectasis with trace effusion. 2. Cardiomegaly.  Normal RV to LV ratio. 3. 15 mm right thyroid nodule. Sonographic follow-up is recommended, especially given patient age. Electronically Signed   By: Monte Fantasia M.D.   On: 03/01/2017 10:33      IMPRESSION AND PLAN:    89 y f with k/h/o kidney stones, hypertension, lupus being admitted for acute PE  * Acute PE: New diagnosis - started on heparin drip - can consider switch over to NOAC - echo - LE dopplers  * Lupus nephritis: getting IV Cytoxan at [facility] managed by Dr Grayland Ormond. All of her laboratory work is being monitored by nephrology. Plan is to give 5-6 treatments every 28 days.   * Thyroid Nodule: seen on CT chest - get thyroid US for further eval  * Anemia: Mild and stable, likely secondary to chronic renal insufficiency. Monitor.  * Chronic renal insufficiency: Treatment per nephrology. Cytoxan as above.    All the records are reviewed and case discussed with ED provider. Management plans discussed with the patient, family and they are in agreement.  CODE STATUS: FULL CODE  TOTAL TIME TAKING CARE OF THIS PATIENT: 45 minutes.    Max Sane M.D on 03/01/2017 at 10:52 AM  Between 7am to 6pm - Pager - [phone]  After 6pm go to [email or web address] - Proofreader  [facility]  Office  [phone]  CC: Primary care physician; Letta Median, MD   Note: This dictation was prepared with Dragon dictation along with smaller phrase technology. Any transcriptional errors that result from this process  are unintentional.

## 2017-03-01 NOTE — ED Notes (Signed)
[place] called and informed them pt is still in ED and can go to [place].

## 2017-03-01 NOTE — ED Notes (Signed)
Heparin verified with [facility]

## 2017-03-01 NOTE — ED Triage Notes (Signed)
Patient presents to the ED with pain x 3 days that radiates from the right side of her abdomen up her right side into her chest and right shoulder.  Patient appears uncomfortable and is tearful in triage, rocking back and forth.  Patient denies that area is tender to touch.  Patient reports on May 15th she had a kidney stone removed through her bladder.  Patient also has history of lupus.

## 2017-03-01 NOTE — Progress Notes (Signed)
ANTICOAGULATION CONSULT NOTE - Initial Consult  Pharmacy Consult for Heparin Indication: pulmonary embolus  Allergies  Allergen Reactions  . Penicillins     Yeast infection Has patient had a PCN reaction causing immediate rash, facial/tongue/throat swelling, SOB or lightheadedness with hypotension:No Has patient had a PCN reaction causing severe rash involving mucus membranes or skin necrosis: Yes Has patient had a PCN reaction that required hospitalization No Has patient had a PCN reaction occurring within the last 10 years: No If all of the above answers are "NO", then may proceed with Cephalosporin use.     Patient Measurements: Height: 5\' 2"  (157.5 cm) Weight: 106 lb (48.1 kg) IBW/kg (Calculated) : 50.1 Heparin Dosing Weight: 48.1 kg  Vital Signs: Temp: 98.5 F (36.9 C) (05/30 0734) Temp Source: Oral (05/30 0734) BP: 120/88 (05/30 0845) Pulse Rate: 95 (05/30 0845)  Labs:  Recent Labs  03/01/17 0800  HGB 8.5*  HCT 26.1*  PLT 266  CREATININE 1.20*  TROPONINI <0.03    Estimated Creatinine Clearance: 50.6 mL/min (A) (by C-G formula based on SCr of 1.2 mg/dL (H)).   Medical History: Past Medical History:  Diagnosis Date  . Acute renal failure (ARF) ([place])   . Anemia   . History of kidney stones   . Hypertension    OFF BP MEDS X 3 WEEKS DUE TO BP CONTROL  . 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 Rash   . Staghorn calculus   . 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 heparin    . levofloxacin (LEVAQUIN) IV      Assessment: 36 yo female admitted with CT confirmed pulmonary embolism.   Goal of Therapy:  Heparin level 0.3-0.7 units/ml Monitor platelets by anticoagulation protocol: Yes   Plan:  Give 3000 units bolus x 1 Start heparin infusion at 700 units/hr Check anti-Xa level in 6 hours and daily while on heparin Continue to monitor H&H and platelets  Bethzy Hauck K,  RPh 03/01/2017,10:56 AM

## 2017-03-02 LAB — CBC
HCT: 23 % — ABNORMAL LOW (ref 35.0–47.0)
HEMOGLOBIN: 7.5 g/dL — AB (ref 12.0–16.0)
MCH: 28 pg (ref 26.0–34.0)
MCHC: 32.6 g/dL (ref 32.0–36.0)
MCV: 85.8 fL (ref 80.0–100.0)
Platelets: 263 10*3/uL (ref 150–440)
RBC: 2.68 MIL/uL — ABNORMAL LOW (ref 3.80–5.20)
RDW: 15.7 % — ABNORMAL HIGH (ref 11.5–14.5)
WBC: 4.7 10*3/uL (ref 3.6–11.0)

## 2017-03-02 LAB — GLUCOSE, CAPILLARY: Glucose-Capillary: 89 mg/dL (ref 65–99)

## 2017-03-02 LAB — BASIC METABOLIC PANEL
ANION GAP: 6 (ref 5–15)
BUN: 10 mg/dL (ref 6–20)
CALCIUM: 8.6 mg/dL — AB (ref 8.9–10.3)
CO2: 23 mmol/L (ref 22–32)
Chloride: 108 mmol/L (ref 101–111)
Creatinine, Ser: 1.27 mg/dL — ABNORMAL HIGH (ref 0.44–1.00)
GFR calc Af Amer: >60 mL/min (ref >60)
GFR calc non Af Amer: 55 mL/min — ABNORMAL LOW (ref >60)
GLUCOSE: 100 mg/dL — AB (ref 65–99)
Potassium: 3.9 mmol/L (ref 3.5–5.1)
Sodium: 137 mmol/L (ref 135–145)

## 2017-03-02 LAB — HEPARIN LEVEL (UNFRACTIONATED)
HEPARIN UNFRACTIONATED: 0.35 [IU]/mL (ref 0.30–0.70)
Heparin Unfractionated: 0.36 IU/mL (ref 0.30–0.70)

## 2017-03-02 LAB — ECHOCARDIOGRAM COMPLETE
Height: 62 in
WEIGHTICAEL: 1696 [oz_av]

## 2017-03-02 MED ORDER — MORPHINE SULFATE (PF) 2 MG/ML IV SOLN
2.0000 mg | Freq: Four times a day (QID) | INTRAVENOUS | Status: DC | PRN
Start: 2017-03-02 — End: 2017-03-02
  Administered 2017-03-02: 10:00:00 2 mg via INTRAVENOUS
  Filled 2017-03-02: qty 1

## 2017-03-02 MED ORDER — PREDNISONE 10 MG PO TABS
10.0000 mg | ORAL_TABLET | Freq: Every day | ORAL | Status: DC
  Administered 2017-03-02 – 2017-03-03 (×2): 10 mg via ORAL
  Filled 2017-03-02 (×2): qty 1

## 2017-03-02 MED ORDER — APIXABAN 5 MG PO TABS: 5.0000 mg | ORAL_TABLET | Freq: Two times a day (BID) | ORAL | Status: DC

## 2017-03-02 MED ORDER — APIXABAN 5 MG PO TABS
10.0000 mg | ORAL_TABLET | Freq: Two times a day (BID) | ORAL | Status: DC
  Administered 2017-03-02 – 2017-03-03 (×3): 10 mg via ORAL
  Filled 2017-03-02 (×3): qty 2

## 2017-03-02 NOTE — Consult Note (Signed)
Case discussed with Dr. Margaretmary Eddy.  Patient last seen in the [facility] to receive Cytoxan infusion for her lupus nephritis on Feb 20, 2017. She was recently admitted to the hospital for right-sided pleuritic chest pain and found to have an acute pulmonary embolism. Lower extremity Doppler did not reveal DVT. Okay to transition patient to Eliquis or Xarelto. Patient will require 6 months of treatment. Please insure patient keeps her previously scheduled follow-up appointment in the [facility] on March 20, 2017 for her next infusion of Cytoxan. Continue follow-up with nephrology as indicated.  Appreciate consult, call with questions.

## 2017-03-02 NOTE — Progress Notes (Signed)
ANTICOAGULATION CONSULT NOTE - Initial Consult  Pharmacy Consult for apixaban Indication: pulmonary embolus  Allergies  Allergen Reactions  . Penicillins     Yeast infection Has patient had a PCN reaction causing immediate rash, facial/tongue/throat swelling, SOB or lightheadedness with hypotension:No Has patient had a PCN reaction causing severe rash involving mucus membranes or skin necrosis: Yes Has patient had a PCN reaction that required hospitalization No Has patient had a PCN reaction occurring within the last 10 years: No If all of the above answers are "NO", then may proceed with Cephalosporin use.     Patient Measurements: Height: 5\' 2"  (157.5 cm) Weight: 108 lb 11.2 oz (49.3 kg) IBW/kg (Calculated) : 50.1  Vital Signs: Temp: 99.8 F (37.7 C) (05/31 1238) Temp Source: Oral (05/31 1238) BP: 113/78 (05/31 1238) Pulse Rate: 106 (05/31 1238)  Labs:  Recent Labs  03/01/17 0800 03/01/17 1238 03/01/17 1908 03/02/17 0255 03/02/17 1004  HGB 8.5*  --   --  7.5*  --   HCT 26.1*  --   --  23.0*  --   PLT 266  --   --  263  --   APTT  --  31  --   --   --   LABPROT  --  13.0  --   --   --   INR  --  0.98  --   --   --   HEPARINUNFRC  --   --  0.26* 0.35 0.36  CREATININE 1.20*  --   --  1.27*  --   TROPONINI <0.03  --   --   --   --     Estimated Creatinine Clearance: 49 mL/min (A) (by C-G formula based on SCr of 1.27 mg/dL (H)).   Medical History: Past Medical History:  Diagnosis Date  . Acute renal failure (ARF) ([place])   . Anemia   . History of kidney stones   . Hypertension    OFF BP MEDS X 3 WEEKS DUE TO BP CONTROL  . 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 Rash   . Staghorn calculus   . 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 Consult entered for pharmacy to dose apixaban for PE. Heparin drip was discontinued by MD.  Goal of  Therapy:  Monitor platelets by anticoagulation protocol: Yes   Plan:  Apixaban 10 mg PO BID x 7 days followed by apixaban 5 mg PO BID. CBC/SCr to be checked at least every 72 hours while inpatient.  Lenis Noon, PharmD Clinical Pharmacist 03/02/2017,3:44 PM

## 2017-03-02 NOTE — Progress Notes (Signed)
Pnt care assumed until 0000.  Pnt in bed and resting. Pnt appears to be comfortable and asleep with HOB in 90 degree position. Bed low and locked with call bell in reach. No issues or concerns at this time. Will continue to monitor and assess.

## 2017-03-02 NOTE — Progress Notes (Signed)
ANTICOAGULATION CONSULT NOTE - Follow up [place] for Heparin Indication: pulmonary embolus  Allergies  Allergen Reactions  . Penicillins     Yeast infection Has patient had a PCN reaction causing immediate rash, facial/tongue/throat swelling, SOB or lightheadedness with hypotension:No Has patient had a PCN reaction causing severe rash involving mucus membranes or skin necrosis: Yes Has patient had a PCN reaction that required hospitalization No Has patient had a PCN reaction occurring within the last 10 years: No If all of the above answers are "NO", then may proceed with Cephalosporin use.     Patient Measurements: Height: 5\' 2"  (157.5 cm) Weight: 108 lb 11.2 oz (49.3 kg) IBW/kg (Calculated) : 50.1 Heparin Dosing Weight: 48.1 kg  Vital Signs: Temp: 98.5 F (36.9 C) (05/31 0828) Temp Source: Oral (05/31 0828) BP: 106/68 (05/31 0828) Pulse Rate: 95 (05/31 0828)  Labs:  Recent Labs  03/01/17 0800 03/01/17 1238 03/01/17 1908 03/02/17 0255 03/02/17 1004  HGB 8.5*  --   --  7.5*  --   HCT 26.1*  --   --  23.0*  --   PLT 266  --   --  263  --   APTT  --  31  --   --   --   LABPROT  --  13.0  --   --   --   INR  --  0.98  --   --   --   HEPARINUNFRC  --   --  0.26* 0.35 0.36  CREATININE 1.20*  --   --  1.27*  --   TROPONINI <0.03  --   --   --   --     Estimated Creatinine Clearance: 49 mL/min (A) (by C-G formula based on SCr of 1.27 mg/dL (H)).   Medical History: Past Medical History:  Diagnosis Date  . Acute renal failure (ARF) ([place])   . Anemia   . History of kidney stones   . Hypertension    OFF BP MEDS X 3 WEEKS DUE TO BP CONTROL  . 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 Rash   . Staghorn calculus   . 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 sodium chloride 75 mL/hr at 03/02/17 0350  . heparin 800 Units/hr (03/01/17 2035)    Assessment: 36 yo female  admitted with CT confirmed pulmonary embolism.   Goal of Therapy:  Heparin level 0.3-0.7 units/ml Monitor platelets by anticoagulation protocol: Yes   Plan:  Give 3000 units bolus x 1 Start heparin infusion at 700 units/hr Check anti-Xa level in 6 hours and daily while on heparin Continue to monitor H&H and platelets   5/30 1908 - heparin level 0.26 - subtherapeutic; will bolus heparin 1000 units x1 then increase heparin drip to 800 units/hr (=8 ml/hr). Heparin level 6h after rate change, CBC in AM.  Per RN no line issues or s/sx of bleeding noted given in report.   5/31 03:00 heparin level 0.35. Continue current regimen. Recheck in 6 hours to confirm.  5/31 1004 HL therapeutic x 2. Continue current rate. Will recheck HL and CBC daily while on heparin.  Kiyoshi Schaab A. [place]ob[place], [place].D., BCPS Clinical Pharmacist 03/02/17 10:51 AM

## 2017-03-02 NOTE — Plan of Care (Signed)
Problem: Education: Goal: Knowledge of [facility] Education information/materials will improve Outcome: Progressing VSS, free of falls during shift.  Reports R abdominal/chest pain 8-9/10, improved w/ PRN PO Norco 10-650mg  x2.  Per pharmacy, received 1000u heparin IV bolus, continuous rate increased to 800u/hr d/t subtherapeutic heparin level.  No s/s bleeding.  No other needs overnight.  Bed in low position, call bell within reach.  WCTM.

## 2017-03-02 NOTE — Progress Notes (Addendum)
[facility] [place] NAME: Patricia Maldonado    MR#:  [id number]  DATE OF BIRTH:  05-Feb-1981  SUBJECTIVE:  CHIEF COMPLAINT:  Patient is reporting right-sided chest pain radiating to the right side of the abdomen In tears  REVIEW OF SYSTEMS:  CONSTITUTIONAL: No fever, fatigue or weakness.  EYES: No blurred or double vision.  EARS, NOSE, AND THROAT: No tinnitus or ear pain.  RESPIRATORY: No cough, shortness of breath, wheezing or hemoptysis.  CARDIOVASCULAR: Right-sided denies chest pain, orthopnea, edema.  GASTROINTESTINAL: No nausea, vomiting, diarrhea or abdominal pain.  GENITOURINARY: No dysuria, hematuria.  ENDOCRINE: No polyuria, nocturia,  HEMATOLOGY: No anemia, easy bruising or bleeding SKIN: No rash or lesion. MUSCULOSKELETAL: No joint pain or arthritis.   NEUROLOGIC: No tingling, numbness, weakness.  PSYCHIATRY: No anxiety or depression.   DRUG ALLERGIES:   Allergies  Allergen Reactions  . Penicillins     Yeast infection Has patient had a PCN reaction causing immediate rash, facial/tongue/throat swelling, SOB or lightheadedness with hypotension:No Has patient had a PCN reaction causing severe rash involving mucus membranes or skin necrosis: Yes Has patient had a PCN reaction that required hospitalization No Has patient had a PCN reaction occurring within the last 10 years: No If all of the above answers are "NO", then may proceed with Cephalosporin use.     VITALS:  Blood pressure 113/78, pulse (!) 106, temperature 99.8 F (37.7 C), temperature source Oral, resp. rate 20, height 5\' 2"  (1.575 m), weight 49.3 kg (108 lb 11.2 oz), last menstrual period 02/18/2017, SpO2 98 %.  PHYSICAL EXAMINATION:  GENERAL:  36 y.o.-year-old patient lying in the bed with no acute distress.  EYES: Pupils equal, round, reactive to light and accommodation. No scleral icterus. Extraocular muscles intact.  HEENT: Head atraumatic,  normocephalic. Oropharynx and nasopharynx clear.  NECK:  Supple, no jugular venous distention. No thyroid enlargement, no tenderness.  LUNGS: Normal breath sounds bilaterally, no wheezing, rales,rhonchi or crepitation. No use of accessory muscles of respiration.  CARDIOVASCULAR: S1, S2 normal. No murmurs, rubs, or gallops.  ABDOMEN: Soft, nontender, nondistended. Bowel sounds present. No organomegaly or mass.  EXTREMITIES: No pedal edema, cyanosis, or clubbing.  NEUROLOGIC: Cranial nerves II through XII are intact. Muscle strength 5/5 in all extremities. Sensation intact. Gait not checked.  PSYCHIATRIC: The patient is alert and oriented x 3.  SKIN: No obvious rash, lesion, or ulcer.    LABORATORY PANEL:   CBC  Recent Labs Lab 03/02/17 0255  WBC 4.7  HGB 7.5*  HCT 23.0*  PLT 263   ------------------------------------------------------------------------------------------------------------------  Chemistries   Recent Labs Lab 03/01/17 0800 03/02/17 0255  NA 136 137  K 3.6 3.9  CL 107 108  CO2 23 23  GLUCOSE 91 100*  BUN 12 10  CREATININE 1.20* 1.27*  CALCIUM 8.8* 8.6*  AST 14*  --   ALT 9*  --   ALKPHOS 53  --   BILITOT 0.4  --    ------------------------------------------------------------------------------------------------------------------  Cardiac Enzymes  Recent Labs Lab 03/01/17 0800  TROPONINI <0.03   ------------------------------------------------------------------------------------------------------------------  RADIOLOGY:  Dg Chest 2 View  Result Date: 03/01/2017 CLINICAL DATA:  Right chest and abdominal pain, recent right percutaneous nephrolithotomy for stone removal. History of hypertension, lupus, and smoking. EXAM: CHEST  2 VIEW COMPARISON:  None available FINDINGS: Ill-defined right basilar opacity partial obscures the right hemidiaphragm compatible with right basilar atelectasis/airspace disease. Basilar pneumonia not excluded. Left lung  remains clear. Normal heart  size and vascularity. No significant effusion, edema, or pneumothorax. Calcified granuloma suspected in the left upper lobe. Trachea is midline. No osseous abnormality. Normal bowel gas pattern. IMPRESSION: Right basilar atelectasis versus pneumonia. Electronically Signed   By: Jerilynn Mages.  Shick M.D.   On: 03/01/2017 08:37   Ct Angio Chest Pe W Or Wo Contrast  Result Date: 03/01/2017 CLINICAL DATA:  Right-sided chest pain.  History of lupus. EXAM: CT ANGIOGRAPHY CHEST WITH CONTRAST TECHNIQUE: Multidetector CT imaging of the chest was performed using the standard protocol during bolus administration of intravenous contrast. Multiplanar CT image reconstructions and MIPs were obtained to evaluate the vascular anatomy. CONTRAST:  75 cc Isovue 370 intravenous COMPARISON:  None. FINDINGS: Cardiovascular: Central branching pulmonary artery filling defect in the lateral and posterior basal segments of the right lower lobe consistent with acute pulmonary embolism. There is cardiomegaly. Normal RV to LV ratio. No acute aortic finding. No pericardial effusion Mediastinum/Nodes: 15 mm right thyroid nodule. Lungs/Pleura: Ground-glass density in the right lower lobe with trace effusion. The left lung is clear. Upper Abdomen: No acute finding Musculoskeletal: No acute or aggressive finding. Critical Value/emergent results were called by telephone at the time of interpretation on 03/01/2017 at 10:31 am to Dr. Harvest Dark , who verbally acknowledged these results. Review of the MIP images confirms the above findings. IMPRESSION: 1. Positive for acute pulmonary embolism affecting 2 segmental branches in the right lower lobe. Right lower lobe infarct and atelectasis with trace effusion. 2. Cardiomegaly.  Normal RV to LV ratio. 3. 15 mm right thyroid nodule. Sonographic follow-up is recommended, especially given patient age. Electronically Signed   By: Monte Fantasia M.D.   On: 03/01/2017 10:33   US  Venous Img Lower Bilateral  Result Date: 03/01/2017 CLINICAL DATA:  Bilateral leg swelling. Acute pulmonary embolism on a recent chest CTA. EXAM: BILATERAL LOWER EXTREMITY VENOUS DOPPLER ULTRASOUND TECHNIQUE: Gray-scale sonography with graded compression, as well as color Doppler and duplex ultrasound were performed to evaluate the lower extremity deep venous systems from the level of the common femoral vein and including the common femoral, femoral, profunda femoral, popliteal and calf veins including the posterior tibial, peroneal and gastrocnemius veins when visible. The superficial great saphenous vein was also interrogated. Spectral Doppler was utilized to evaluate flow at rest and with distal augmentation maneuvers in the common femoral, femoral and popliteal veins. COMPARISON:  None. FINDINGS: RIGHT LOWER EXTREMITY Common Femoral Vein: No evidence of thrombus. Normal compressibility, respiratory phasicity and response to augmentation. Saphenofemoral Junction: No evidence of thrombus. Normal compressibility and flow on color Doppler imaging. Profunda Femoral Vein: No evidence of thrombus. Normal compressibility and flow on color Doppler imaging. Femoral Vein: No evidence of thrombus. Normal compressibility, respiratory phasicity and response to augmentation. Popliteal Vein: No evidence of thrombus. Normal compressibility, respiratory phasicity and response to augmentation. Calf Veins: No evidence of thrombus. Normal compressibility and flow on color Doppler imaging. Superficial Great Saphenous Vein: No evidence of thrombus. Normal compressibility and flow on color Doppler imaging. Venous Reflux:  None. Other Findings:  None. LEFT LOWER EXTREMITY Common Femoral Vein: No evidence of thrombus. Normal compressibility, respiratory phasicity and response to augmentation. Saphenofemoral Junction: No evidence of thrombus. Normal compressibility and flow on color Doppler imaging. Profunda Femoral Vein: No evidence of  thrombus. Normal compressibility and flow on color Doppler imaging. Femoral Vein: No evidence of thrombus. Normal compressibility, respiratory phasicity and response to augmentation. Popliteal Vein: No evidence of thrombus. Normal compressibility, respiratory phasicity and response to augmentation. Calf  Veins: No evidence of thrombus. Normal compressibility and flow on color Doppler imaging. Superficial Great Saphenous Vein: No evidence of thrombus. Normal compressibility and flow on color Doppler imaging. Venous Reflux:  None. Other Findings:  None. IMPRESSION: Normal examination. No evidence of DVT within either lower extremity. Electronically Signed   By: Claudie Revering M.D.   On: 03/01/2017 12:36   US Thyroid  Result Date: 03/01/2017 CLINICAL DATA:  Incidental on CT. Right thyroid nodule seen on chest CT. EXAM: THYROID ULTRASOUND TECHNIQUE: Ultrasound examination of the thyroid gland and adjacent soft tissues was performed. COMPARISON:  Chest CT 03/01/2017 FINDINGS: Parenchymal Echotexture: Normal Isthmus: 0.3 cm in the AP dimension. Right lobe: 5.4 x 2.0 x 2.2 cm. Left lobe: 4.9 x 1.7 x 1.8 cm _________________________________________________________ Estimated total number of nodules >/= 1 cm: 1 Number of spongiform nodules >/=  2 cm not described below (TR1): 0 Number of mixed cystic and solid nodules >/= 1.5 cm not described below ([place]): 0 _________________________________________________________ Nodule # 1: Location: Right; Inferior Maximum size: 1.7 cm; Other 2 dimensions: 1.3 x 1.3 cm Composition: solid/almost completely solid (2) Echogenicity: hypoechoic (2) Shape: not taller-than-wide (0) Margins: ill-defined (0) Echogenic foci: none (0) ACR TI-RADS total points: 4. ACR TI-RADS risk category: TR4 (4-6 points). ACR TI-RADS recommendations: **Given size (>/= 1.5 cm) and appearance, fine needle aspiration of this moderately suspicious nodule should be considered based on TI-RADS criteria.  _________________________________________________________ No discrete nodules in left thyroid lobe. IMPRESSION: 1.7 cm nodule in the inferior right thyroid lobe. This corresponds with the recent CT findings. This nodule does meet criteria for ultrasound-guided biopsy. The above is in keeping with the ACR TI-RADS recommendations - J Am Coll Radiol 2017;14:587-595. Electronically Signed   By: Markus Daft M.D.   On: 03/01/2017 12:36    EKG:   Orders placed or performed in visit on 03/01/17  . EKG 12-Lead  . EKG 12-Lead    ASSESSMENT AND PLAN:   75 y f with k/h/o kidney stones, hypertension, lupus being admitted for acute PE  * Acute PE: New diagnosis - started on heparin drip - switch over to NOAC Eliquis - echo- nml - LE dopplers- no DVT -Pain management as needed  * Lupus nephritis: getting IV Cytoxan at [facility] managed by Dr Grayland Ormond. All of her laboratory work is being monitored by nephrology. Plan is to give 5-6 treatments every 28 days.   * Thyroid Nodule: seen on CT chest -Recommending ultrasound-guided biopsy of the thyroid nodule, consult IR  * Anemia: Mild and stable, likely secondary to chronic renal insufficiency. Monitor. Hemoglobin 8.5-7.5  * Chronic renal insufficiency: Treatment per nephrology. Cytoxan as above.       All the records are reviewed and case discussed with Care Management/Social Workerr. Management plans discussed with the patient, family and they are in agreement.  CODE STATUS: fc   TOTAL TIME TAKING CARE OF THIS PATIENT: 88minutes.   POSSIBLE D/C IN 1-2  DAYS, DEPENDING ON CLINICAL CONDITION.  Note: This dictation was prepared with Dragon dictation along with smaller phrase technology. Any transcriptional errors that result from this process are unintentional.   Nicholes Mango M.D on 03/02/2017 at 3:31 PM  Between 7am to 6pm - Pager - [phone] After 6pm go to [email or web address] - password [email or web address]  Office   [phone]  CC: Primary care physician; Letta Median, MD

## 2017-03-03 LAB — CBC
HEMATOCRIT: 21.5 % — AB (ref 35.0–47.0)
HEMOGLOBIN: 7 g/dL — AB (ref 12.0–16.0)
MCH: 28.3 pg (ref 26.0–34.0)
MCHC: 32.6 g/dL (ref 32.0–36.0)
MCV: 86.9 fL (ref 80.0–100.0)
Platelets: 256 10*3/uL (ref 150–440)
RBC: 2.47 MIL/uL — ABNORMAL LOW (ref 3.80–5.20)
RDW: 15.8 % — ABNORMAL HIGH (ref 11.5–14.5)
WBC: 5.1 10*3/uL (ref 3.6–11.0)

## 2017-03-03 LAB — GLUCOSE, CAPILLARY: Glucose-Capillary: 115 mg/dL — ABNORMAL HIGH (ref 65–99)

## 2017-03-03 LAB — HIV ANTIBODY (ROUTINE TESTING W REFLEX): HIV Screen 4th Generation wRfx: NONREACTIVE

## 2017-03-03 MED ORDER — APIXABAN 5 MG PO TABS: 10.0000 mg | ORAL_TABLET | Freq: Two times a day (BID) | ORAL | 0 refills | Status: DC

## 2017-03-03 MED ORDER — HYDROCODONE-ACETAMINOPHEN 5-325 MG PO TABS: 1.0000 | ORAL_TABLET | Freq: Four times a day (QID) | ORAL | 0 refills | Status: DC | PRN

## 2017-03-03 NOTE — Discharge Summary (Signed)
[facility] NAME: Patricia Maldonado    MR#:  [id number]  DATE OF BIRTH:  October 16, 1980  DATE OF ADMISSION:  03/01/2017 ADMITTING PHYSICIAN: Max Sane, MD  DATE OF DISCHARGE: 03/03/17 PRIMARY CARE PHYSICIAN: Letta Median, MD    ADMISSION DIAGNOSIS:  Swelling [R60.9] Right thyroid nodule [E04.1] Right kidney stone [N20.0] Right-sided chest pain [R07.9] Other acute pulmonary embolism without acute cor pulmonale (HCC) [I26.99]  DISCHARGE DIAGNOSIS:  Active Problems:   PE (pulmonary thromboembolism) ([place])   SECONDARY DIAGNOSIS:   Past Medical History:  Diagnosis Date  . Acute renal failure (ARF) ([place])   . Anemia   . History of kidney stones   . Hypertension    OFF BP MEDS X 3 WEEKS DUE TO BP CONTROL  . 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 Rash   . Staghorn calculus   . 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 female with a known history of kidney stones, hypertension, lupus, presents to the emergency department for right-sided abdominal/chest pain. According to the patient for the past 2 days she has been experiencing significant right sided abdominal and chest pain. States the pain is worse if she coughs or lies down on her back. Patient recently had a kidney stone removed last month. Denies any complications from the procedure, denies any dysuria or hematuria since. Denies any fever. Patient states the pain is moderate at all times but severe if she coughs takes a deep breath or lies on her back.Describes the pain as sharp. Underwent CT angio which showed acute PE for which she is started on heparin drip and is being admitted for further eval and mgmt.   * Acute PE: New diagnosis - started on heparin drip - switch over to NOAC Eliquis,Will discharge patient with a question milligrams by mouth twice a day for 7 days  (patient will receive 3 doses prior to discharge) continue 5 mg by mouth twice a day after 1 week - echo- nml - LE dopplers- no DVT -Pain management provided during the hospital course will discharge with oxycodone  * Lupus nephritis: gettingIV Cytoxan at [facility] managed by Dr Grayland Ormond. All of her laboratory work is beingmonitored by nephrology. Plan is to give 5-6 treatments every 28 days.  Outpatient follow-up with Dr. Holley Raring  * Thyroid Nodule: seen on CT chest -Recommending ultrasound-guided biopsy of the thyroid nodule op, but as per my discussion with radiology Dr.Yamagado  repeat ultrasound is recommended in 3-6 months  *Anemia: Mild and stable, likely secondary to chronic renal insufficiency. Monitor. Hemoglobin 8.5-7.5, patient is asymptomatic, will discharge with iron supplements discussed with Dr. Candiss Norse not recommending blood transfusion at this time  *Chronic renal insufficiency: Treatment per nephrology. Cytoxan as above.  DISCHARGE CONDITIONS:   STABLE CONSULTS OBTAINED:  Treatment Team:  Lloyd Huger, MD   PROCEDURES  NONE   DRUG ALLERGIES:   Allergies  Allergen Reactions  . Penicillins     Yeast infection Has patient had a PCN reaction causing immediate rash, facial/tongue/throat swelling, SOB or lightheadedness with hypotension:No Has patient had a PCN reaction causing severe rash involving mucus membranes or skin necrosis: Yes Has patient had a PCN reaction that required hospitalization No Has patient had a PCN reaction occurring within the last 10 years: No If all of the above answers are "NO",  then may proceed with Cephalosporin use.     DISCHARGE MEDICATIONS:   Current Discharge Medication List    START taking these medications   Details  apixaban (ELIQUIS) 5 MG TABS tablet Take 2 tablets (10 mg total) by mouth 2 (two) times daily. 10 mg by mouth twice a day 12 doses followed by 5 mg twice a day Qty: 60 tablet, Refills: 0      CONTINUE  these medications which have CHANGED   Details  HYDROcodone-acetaminophen (NORCO/VICODIN) 5-325 MG tablet Take 1-2 tablets by mouth every 6 (six) hours as needed for moderate pain. Qty: 15 tablet, Refills: 0      CONTINUE these medications which have NOT CHANGED   Details  amLODipine (NORVASC) 5 MG tablet Take 5 mg by mouth daily.    Cyclophosphamide (CYTOXAN LYOPHILIZED IV) Inject 1 Dose into the vein every 30 (thirty) days.    hydroxychloroquine (PLAQUENIL) 200 MG tablet Take 1 tablet (200 mg total) by mouth daily. Qty: 30 tablet, Refills: 0    predniSONE (DELTASONE) 10 MG tablet TAKE 1 TABLET BY MOUTH ONCE A DAY IN THE MORNING Refills: 1    sulfamethoxazole-trimethoprim (BACTRIM DS,SEPTRA DS) 800-160 MG tablet Take 1 tablet by mouth every 12 (twelve) hours. Qty: 14 tablet, Refills: 0   Associated Diagnoses: Urinary tract infection with hematuria, site unspecified    tamsulosin (FLOMAX) 0.4 MG CAPS capsule Take 1 capsule (0.4 mg total) by mouth daily. Qty: 30 capsule, Refills: 0    acetaminophen (TYLENOL) 500 MG tablet Take 1-2 tablets (500-1,000 mg total) by mouth every 6 (six) hours as needed for mild pain. Qty: 30 tablet, Refills: 0    docusate sodium (COLACE) 100 MG capsule Take 1 capsule (100 mg total) by mouth 2 (two) times daily. Qty: 60 capsule, Refills: 0    oxybutynin (DITROPAN) 5 MG tablet Take 1 tablet (5 mg total) by mouth every 8 (eight) hours as needed for bladder spasms. Qty: 30 tablet, Refills: 0         DISCHARGE INSTRUCTIONS:   Follow-up with primary care physician in a week Follow-up with oncology Dr. Grayland Ormond in a week Follow-up with nephrology Dr. Holley Raring in a week    DIET:  Renal   DISCHARGE CONDITION:  Stable  ACTIVITY:  Activity as tolerated  OXYGEN:  Home Oxygen: No.   Oxygen Delivery: room air  DISCHARGE LOCATION:  home   If you experience worsening of your admission symptoms, develop shortness of breath, life threatening  emergency, suicidal or homicidal thoughts you must seek medical attention immediately by calling 911 or calling your MD immediately  if symptoms less severe.  You Must read complete instructions/literature along with all the possible adverse reactions/side effects for all the Medicines you take and that have been prescribed to you. Take any new Medicines after you have completely understood and accpet all the possible adverse reactions/side effects.   Please note  You were cared for by a hospitalist during your hospital stay. If you have any questions about your discharge medications or the care you received while you were in the hospital after you are discharged, you can call the unit and asked to speak with the hospitalist on call if the hospitalist that took care of you is not available. Once you are discharged, your primary care physician will handle any further medical issues. Please note that NO REFILLS for any discharge medications will be authorized once you are discharged, as it is imperative that you return to your primary  care physician (or establish a relationship with a primary care physician if you do not have one) for your aftercare needs so that they can reassess your need for medications and monitor your lab values.     Today  Chief Complaint  Patient presents with  . Shoulder Pain  . Chest Pain  . Abdominal Pain   Patient is doing much better today denies any chest pain wants to go home. Denies any shortness of breath either.  ROS:  CONSTITUTIONAL: Denies fevers, chills. Denies any fatigue, weakness.  EYES: Denies blurry vision, double vision, eye pain. EARS, NOSE, THROAT: Denies tinnitus, ear pain, hearing loss. RESPIRATORY: Denies cough, wheeze, shortness of breath.  CARDIOVASCULAR: Denies chest pain, palpitations, edema.  GASTROINTESTINAL: Denies nausea, vomiting, diarrhea, abdominal pain. Denies bright red blood per rectum. GENITOURINARY: Denies dysuria,  hematuria. ENDOCRINE: Denies nocturia or thyroid problems. HEMATOLOGIC AND LYMPHATIC: Denies easy bruising or bleeding. SKIN: Denies rash or lesion. MUSCULOSKELETAL: Denies pain in neck, back, shoulder, knees, hips or arthritic symptoms.  NEUROLOGIC: Denies paralysis, paresthesias.  PSYCHIATRIC: Denies anxiety or depressive symptoms.   VITAL SIGNS:  Blood pressure 106/67, pulse (!) 106, temperature 98.3 F (36.8 C), temperature source Oral, resp. rate 20, height 5\' 2"  (1.575 m), weight 49.3 kg (108 lb 11.2 oz), last menstrual period 02/18/2017, SpO2 97 %.  I/O:    Intake/Output Summary (Last 24 hours) at 03/03/17 1357 Last data filed at 03/03/17 1349  Gross per 24 hour  Intake          1274.38 ml  Output                0 ml  Net          1274.38 ml    PHYSICAL EXAMINATION:  GENERAL:  36 y.o.-year-old patient lying in the bed with no acute distress.  EYES: Pupils equal, round, reactive to light and accommodation. No scleral icterus. Extraocular muscles intact.  HEENT: Head atraumatic, normocephalic. Oropharynx and nasopharynx clear.  NECK:  Supple, no jugular venous distention. No thyroid enlargement, no tenderness.  LUNGS: Normal breath sounds bilaterally, no wheezing, rales,rhonchi or crepitation. No use of accessory muscles of respiration.  CARDIOVASCULAR: S1, S2 normal. No murmurs, rubs, or gallops.  ABDOMEN: Soft, non-tender, non-distended. Bowel sounds present. No organomegaly or mass.  EXTREMITIES: No pedal edema, cyanosis, or clubbing.  NEUROLOGIC: Cranial nerves II through XII are intact. Muscle strength 5/5 in all extremities. Sensation intact. Gait not checked.  PSYCHIATRIC: The patient is alert and oriented x 3.  SKIN: No obvious rash, lesion, or ulcer.   DATA REVIEW:   CBC  Recent Labs Lab 03/03/17 0537  WBC 5.1  HGB 7.0*  HCT 21.5*  PLT 256    Chemistries   Recent Labs Lab 03/01/17 0800 03/02/17 0255  NA 136 137  K 3.6 3.9  CL 107 108  CO2 23 23   GLUCOSE 91 100*  BUN 12 10  CREATININE 1.20* 1.27*  CALCIUM 8.8* 8.6*  AST 14*  --   ALT 9*  --   ALKPHOS 53  --   BILITOT 0.4  --     Cardiac Enzymes  Recent Labs Lab 03/01/17 0800  TROPONINI <0.03    Microbiology Results  Results for orders placed or performed during the hospital encounter of 03/01/17  Blood culture (routine x 2)     Status: None (Preliminary result)   Collection Time: 03/01/17 12:38 PM  Result Value Ref Range Status   Specimen Description BLOOD  LEFT  FOREARM   Final   Special Requests   Final    BOTTLES DRAWN AEROBIC AND ANAEROBIC Blood Culture results may not be optimal due to an excessive volume of blood received in culture bottles   Culture NO GROWTH 2 DAYS  Final   Report Status PENDING  Incomplete  Blood culture (routine x 2)     Status: None (Preliminary result)   Collection Time: 03/01/17 12:38 PM  Result Value Ref Range Status   Specimen Description BLOOD  RIGHT AC   Final   Special Requests   Final    BOTTLES DRAWN AEROBIC AND ANAEROBIC Blood Culture adequate volume   Culture NO GROWTH 2 DAYS  Final   Report Status PENDING  Incomplete    RADIOLOGY:  Dg Chest 2 View  Result Date: 03/01/2017 CLINICAL DATA:  Right chest and abdominal pain, recent right percutaneous nephrolithotomy for stone removal. History of hypertension, lupus, and smoking. EXAM: CHEST  2 VIEW COMPARISON:  None available FINDINGS: Ill-defined right basilar opacity partial obscures the right hemidiaphragm compatible with right basilar atelectasis/airspace disease. Basilar pneumonia not excluded. Left lung remains clear. Normal heart size and vascularity. No significant effusion, edema, or pneumothorax. Calcified granuloma suspected in the left upper lobe. Trachea is midline. No osseous abnormality. Normal bowel gas pattern. IMPRESSION: Right basilar atelectasis versus pneumonia. Electronically Signed   By: Jerilynn Mages.  Shick M.D.   On: 03/01/2017 08:37   Ct Angio Chest Pe W Or Wo  Contrast  Result Date: 03/01/2017 CLINICAL DATA:  Right-sided chest pain.  History of lupus. EXAM: CT ANGIOGRAPHY CHEST WITH CONTRAST TECHNIQUE: Multidetector CT imaging of the chest was performed using the standard protocol during bolus administration of intravenous contrast. Multiplanar CT image reconstructions and MIPs were obtained to evaluate the vascular anatomy. CONTRAST:  75 cc Isovue 370 intravenous COMPARISON:  None. FINDINGS: Cardiovascular: Central branching pulmonary artery filling defect in the lateral and posterior basal segments of the right lower lobe consistent with acute pulmonary embolism. There is cardiomegaly. Normal RV to LV ratio. No acute aortic finding. No pericardial effusion Mediastinum/Nodes: 15 mm right thyroid nodule. Lungs/Pleura: Ground-glass density in the right lower lobe with trace effusion. The left lung is clear. Upper Abdomen: No acute finding Musculoskeletal: No acute or aggressive finding. Critical Value/emergent results were called by telephone at the time of interpretation on 03/01/2017 at 10:31 am to Dr. Harvest Dark , who verbally acknowledged these results. Review of the MIP images confirms the above findings. IMPRESSION: 1. Positive for acute pulmonary embolism affecting 2 segmental branches in the right lower lobe. Right lower lobe infarct and atelectasis with trace effusion. 2. Cardiomegaly.  Normal RV to LV ratio. 3. 15 mm right thyroid nodule. Sonographic follow-up is recommended, especially given patient age. Electronically Signed   By: Monte Fantasia M.D.   On: 03/01/2017 10:33   US Venous Img Lower Bilateral  Result Date: 03/01/2017 CLINICAL DATA:  Bilateral leg swelling. Acute pulmonary embolism on a recent chest CTA. EXAM: BILATERAL LOWER EXTREMITY VENOUS DOPPLER ULTRASOUND TECHNIQUE: Gray-scale sonography with graded compression, as well as color Doppler and duplex ultrasound were performed to evaluate the lower extremity deep venous systems from  the level of the common femoral vein and including the common femoral, femoral, profunda femoral, popliteal and calf veins including the posterior tibial, peroneal and gastrocnemius veins when visible. The superficial great saphenous vein was also interrogated. Spectral Doppler was utilized to evaluate flow at rest and with distal augmentation maneuvers in the common femoral, femoral and  popliteal veins. COMPARISON:  None. FINDINGS: RIGHT LOWER EXTREMITY Common Femoral Vein: No evidence of thrombus. Normal compressibility, respiratory phasicity and response to augmentation. Saphenofemoral Junction: No evidence of thrombus. Normal compressibility and flow on color Doppler imaging. Profunda Femoral Vein: No evidence of thrombus. Normal compressibility and flow on color Doppler imaging. Femoral Vein: No evidence of thrombus. Normal compressibility, respiratory phasicity and response to augmentation. Popliteal Vein: No evidence of thrombus. Normal compressibility, respiratory phasicity and response to augmentation. Calf Veins: No evidence of thrombus. Normal compressibility and flow on color Doppler imaging. Superficial Great Saphenous Vein: No evidence of thrombus. Normal compressibility and flow on color Doppler imaging. Venous Reflux:  None. Other Findings:  None. LEFT LOWER EXTREMITY Common Femoral Vein: No evidence of thrombus. Normal compressibility, respiratory phasicity and response to augmentation. Saphenofemoral Junction: No evidence of thrombus. Normal compressibility and flow on color Doppler imaging. Profunda Femoral Vein: No evidence of thrombus. Normal compressibility and flow on color Doppler imaging. Femoral Vein: No evidence of thrombus. Normal compressibility, respiratory phasicity and response to augmentation. Popliteal Vein: No evidence of thrombus. Normal compressibility, respiratory phasicity and response to augmentation. Calf Veins: No evidence of thrombus. Normal compressibility and flow on  color Doppler imaging. Superficial Great Saphenous Vein: No evidence of thrombus. Normal compressibility and flow on color Doppler imaging. Venous Reflux:  None. Other Findings:  None. IMPRESSION: Normal examination. No evidence of DVT within either lower extremity. Electronically Signed   By: Claudie Revering M.D.   On: 03/01/2017 12:36   US Thyroid  Addendum Date: 03/02/2017   ADDENDUM REPORT: 03/02/2017 16:35 ADDENDUM: Characteristics of the thyroid nodule in the inferior right lobe were reviewed for potential biopsy. The patient is currently hospitalized with acute pulmonary embolism and is being anticoagulated. Echogenicity of the nodule is essentially nearly isoechoic and this would decrease the TI-RADS risk category to a TR3 lesion. At the lesion size, a TR3 lesion would warrant a 1 year follow-up ultrasound rather than immediate biopsy. Given that the patient is recovering from pulmonary embolism and will be anticoagulated for at least 6 months, it would be reasonable to perform a 1 year follow-up ultrasound on this nodule rather than proceeding with immediate fine-needle aspiration. Ultrasound characteristics of this nodule are not highly suspicious for malignancy. I discussed this by telephone with Dr. Margaretmary Eddy. Electronically Signed   By: Aletta Edouard M.D.   On: 03/02/2017 16:35   Result Date: 03/02/2017 CLINICAL DATA:  Incidental on CT. Right thyroid nodule seen on chest CT. EXAM: THYROID ULTRASOUND TECHNIQUE: Ultrasound examination of the thyroid gland and adjacent soft tissues was performed. COMPARISON:  Chest CT 03/01/2017 FINDINGS: Parenchymal Echotexture: Normal Isthmus: 0.3 cm in the AP dimension. Right lobe: 5.4 x 2.0 x 2.2 cm. Left lobe: 4.9 x 1.7 x 1.8 cm _________________________________________________________ Estimated total number of nodules >/= 1 cm: 1 Number of spongiform nodules >/=  2 cm not described below (TR1): 0 Number of mixed cystic and solid nodules >/= 1.5 cm not described  below ([place]): 0 _________________________________________________________ Nodule # 1: Location: Right; Inferior Maximum size: 1.7 cm; Other 2 dimensions: 1.3 x 1.3 cm Composition: solid/almost completely solid (2) Echogenicity: hypoechoic (2) Shape: not taller-than-wide (0) Margins: ill-defined (0) Echogenic foci: none (0) ACR TI-RADS total points: 4. ACR TI-RADS risk category: TR4 (4-6 points). ACR TI-RADS recommendations: **Given size (>/= 1.5 cm) and appearance, fine needle aspiration of this moderately suspicious nodule should be considered based on TI-RADS criteria. _________________________________________________________ No discrete nodules in left thyroid lobe. IMPRESSION: 1.7  cm nodule in the inferior right thyroid lobe. This corresponds with the recent CT findings. This nodule does meet criteria for ultrasound-guided biopsy. The above is in keeping with the ACR TI-RADS recommendations - J Am Coll Radiol 2017;14:587-595. Electronically Signed: By: Markus Daft M.D. On: 03/01/2017 12:36    EKG:   Orders placed or performed in visit on 03/01/17  . EKG 12-Lead  . EKG 12-Lead  . EKG 12-Lead      Management plans discussed with the patient, family and they are in agreement.  CODE STATUS:     Code Status Orders        Start     Ordered   03/01/17 1356  Full code  Continuous     03/01/17 1356    Code Status History    Date Active Date Inactive Code Status Order ID Comments User Context   01/23/2017 12:00 PM 01/25/2017  9:51 PM Full Code [id number]  [name], MD Inpatient   10/04/2016  9:50 PM 10/12/2016  3:04 PM Full Code [id number]  Henreitta Leber, MD Inpatient   08/30/2016  7:13 PM 09/04/2016  2:43 PM Full Code [id number]  Hower, Aaron Mose, MD ED      TOTAL TIME TAKING CARE OF THIS PATIENT: 45  minutes.   Note: This dictation was prepared with Dragon dictation along with smaller phrase technology. Any transcriptional errors that result from this process are  unintentional.   @[facility] @  on 03/03/2017 at 1:57 PM  Between 7am to 6pm - Pager - [phone]  After 6pm go to [email or web address] - password [email or web address] [facility]  Office  [phone]  CC: Primary care physician; Letta Median, MD

## 2017-03-03 NOTE — Discharge Instructions (Signed)
Follow-up with primary care physician in a week Follow-up with oncology Dr. Grayland Ormond in a week Follow-up with nephrology Dr. Holley Raring in a week

## 2017-03-03 NOTE — Progress Notes (Signed)
Clinical Education officer, museum (CSW) received consult for medication assistance. RN case manager aware of above. Please reconsult if future social work needs arise. CSW signing off.   [employer], LCSW [phone]

## 2017-03-06 LAB — CULTURE, BLOOD (ROUTINE X 2)
Culture: NO GROWTH
Culture: NO GROWTH
Special Requests: ADEQUATE

## 2017-03-08 DIAGNOSIS — L659 Nonscarring hair loss, unspecified: Secondary | ICD-10-CM | POA: Insufficient documentation

## 2017-03-08 DIAGNOSIS — E041 Nontoxic single thyroid nodule: Secondary | ICD-10-CM | POA: Insufficient documentation

## 2017-03-18 NOTE — Progress Notes (Signed)
[place]  Telephone:(336) [phone] Fax:(336) [phone]  ID: Lin Givens OB: 07-Nov-1980  MR#: [id number]  IRS#:[id number]  Patient Care Team: Letta Median, MD as PCP - General (Family Medicine) Center, [place] (General Practice)  CHIEF COMPLAINT: Lupus nephritis.  INTERVAL HISTORY: Patient returns to clinic today for further evaluation and cycle 3 of monthly Cytoxan. She currently feels well and is asymptomatic. She has no neurologic complaints. She denies any recent fevers or illnesses. She has a good appetite and denies weight loss. She has no chest pain. She denies any nausea, vomiting, constipation, or diarrhea. She has no urinary complaints. Patient offers no further specific complaints today.  REVIEW OF SYSTEMS:   Review of Systems  Constitutional: Negative.  Negative for fever, malaise/fatigue and weight loss.  Respiratory: Negative for cough and shortness of breath.   Cardiovascular: Negative.  Negative for chest pain and leg swelling.  Gastrointestinal: Negative.  Negative for abdominal pain.  Genitourinary: Negative.   Musculoskeletal: Negative.   Skin: Negative.  Negative for rash.  Neurological: Negative for weakness.  Psychiatric/Behavioral: Negative.  The patient is not nervous/anxious.     As per HPI. Otherwise, a complete review of systems is negative.  PAST MEDICAL HISTORY: Past Medical History:  Diagnosis Date  . Acute renal failure (ARF) ([place])   . Anemia   . History of kidney stones   . Hypertension    OFF BP MEDS X 3 WEEKS DUE TO BP CONTROL  . 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 Rash   . Staghorn calculus   . 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 CYSTOSCOPY/URETEROSCOPY/HOLMIUM LASER/STENT PLACEMENT Right 02/21/2017   Procedure: CYSTOSCOPY/URETEROSCOPY/HOLMIUM LASER/STENT  EXCHANGE;  Surgeon: [name], MD;  Location: [facility] ORS;  Service: Urology;  Laterality: Right;  . IR NEPHROSTOMY PLACEMENT RIGHT  01/23/2017  . NEPHROLITHOTOMY Right 01/24/2017   Procedure: NEPHROLITHOTOMY PERCUTANEOUS;  Surgeon: [name], MD;  Location: [facility] ORS;  Service: Urology;  Laterality: Right;  . TUBAL LIGATION      FAMILY HISTORY: Family History  Problem Relation Age of Onset  . Thyroid disease Mother   . Thyroid disease Father   . Diabetes Father     ADVANCED DIRECTIVES (Y/N):  N  HEALTH MAINTENANCE: Social History  Substance Use Topics  . Smoking status: Current Every Day Smoker    Packs/day: 1.00    Years: 15.00    Types: Cigarettes  . Smokeless tobacco: Never Used  . Alcohol use No     Colonoscopy:  PAP:  Bone density:  Lipid panel:  Allergies  Allergen Reactions  . Penicillins     Yeast infection Has patient had a PCN reaction causing immediate rash, facial/tongue/throat swelling, SOB or lightheadedness with hypotension:No Has patient had a PCN reaction causing severe rash involving mucus membranes or skin necrosis: Yes Has patient had a PCN reaction that required hospitalization No Has patient had a PCN reaction occurring within the last 10 years: No If all of the above answers are "NO", then may proceed with Cephalosporin use.     Current Outpatient Prescriptions  Medication Sig Dispense Refill  . acetaminophen (TYLENOL) 500 MG tablet Take 1-2 tablets (500-1,000 mg total) by mouth every 6 (six) hours as needed for mild pain. 30 tablet 0  . amLODipine (NORVASC) 5 MG tablet Take 5 mg by mouth daily.    Marland Kitchen  apixaban (ELIQUIS) 5 MG TABS tablet Take 2 tablets (10 mg total) by mouth 2 (two) times daily. 10 mg by mouth twice a day 12 doses followed by 5 mg twice a day 60 tablet 0  . Cyclophosphamide (CYTOXAN LYOPHILIZED IV) Inject 1 Dose into the vein every 30 (thirty) days.    . hydroxychloroquine (PLAQUENIL) 200 MG tablet Take 1 tablet (200 mg  total) by mouth daily. (Patient taking differently: Take 200 mg by mouth every evening. ) 30 tablet 0  . predniSONE (DELTASONE) 10 MG tablet TAKE 1 TABLET BY MOUTH ONCE A DAY IN THE MORNING  1  . sulfamethoxazole-trimethoprim (BACTRIM DS,SEPTRA DS) 800-160 MG tablet Take 1 tablet by mouth every 12 (twelve) hours. 14 tablet 0  . tamsulosin (FLOMAX) 0.4 MG CAPS capsule Take 1 capsule (0.4 mg total) by mouth daily. 30 capsule 0  . docusate sodium (COLACE) 100 MG capsule Take 1 capsule (100 mg total) by mouth 2 (two) times daily. (Patient not taking: Reported on 03/20/2017) 60 capsule 0  . HYDROcodone-acetaminophen (NORCO/VICODIN) 5-325 MG tablet Take 1-2 tablets by mouth every 6 (six) hours as needed for moderate pain. (Patient not taking: Reported on 03/20/2017) 15 tablet 0  . oxybutynin (DITROPAN) 5 MG tablet Take 1 tablet (5 mg total) by mouth every 8 (eight) hours as needed for bladder spasms. (Patient not taking: Reported on 03/20/2017) 30 tablet 0   No current facility-administered medications for this visit.     OBJECTIVE: Vitals:   03/20/17 0910  BP: 104/72  Pulse: 90  Resp: 18  Temp: 98.3 F (36.8 C)     Body mass index is 20.01 kg/m.    ECOG FS:0 - Asymptomatic  General: Well-developed, well-nourished, no acute distress. Eyes: Pink conjunctiva, anicteric sclera. Lungs: Clear to auscultation bilaterally. Heart: Regular rate and rhythm. No rubs, murmurs, or gallops. Abdomen: Soft, nontender, nondistended. No organomegaly noted, normoactive bowel sounds. Musculoskeletal: No edema, cyanosis, or clubbing. Neuro: Alert, answering all questions appropriately. Cranial nerves grossly intact. Skin: No rashes or petechiae noted. Psych: Normal affect.   LAB RESULTS:  Lab Results  Component Value Date   NA 138 03/20/2017   K 3.9 03/20/2017   CL 105 03/20/2017   CO2 24 03/20/2017   GLUCOSE 99 03/20/2017   BUN 19 03/20/2017   CREATININE 1.45 (H) 03/20/2017   CALCIUM 8.6 (L)  03/20/2017   PROT 6.7 03/20/2017   ALBUMIN 3.1 (L) 03/20/2017   AST 17 03/20/2017   ALT 10 (L) 03/20/2017   ALKPHOS 57 03/20/2017   BILITOT 0.2 (L) 03/20/2017   GFRNONAA 47 (L) 03/20/2017   GFRAA 54 (L) 03/20/2017    Lab Results  Component Value Date   WBC 11.5 (H) 03/20/2017   NEUTROABS 9.2 (H) 03/20/2017   HGB 8.3 (L) 03/20/2017   HCT 24.9 (L) 03/20/2017   MCV 83.3 03/20/2017   PLT 288 03/20/2017     STUDIES: Dg Chest 2 View  Result Date: 03/01/2017 CLINICAL DATA:  Right chest and abdominal pain, recent right percutaneous nephrolithotomy for stone removal. History of hypertension, lupus, and smoking. EXAM: CHEST  2 VIEW COMPARISON:  None available FINDINGS: Ill-defined right basilar opacity partial obscures the right hemidiaphragm compatible with right basilar atelectasis/airspace disease. Basilar pneumonia not excluded. Left lung remains clear. Normal heart size and vascularity. No significant effusion, edema, or pneumothorax. Calcified granuloma suspected in the left upper lobe. Trachea is midline. No osseous abnormality. Normal bowel gas pattern. IMPRESSION: Right basilar atelectasis versus pneumonia. Electronically Signed  By: Eugenie Filler M.D.   On: 03/01/2017 08:37   Ct Angio Chest Pe W Or Wo Contrast  Result Date: 03/01/2017 CLINICAL DATA:  Right-sided chest pain.  History of lupus. EXAM: CT ANGIOGRAPHY CHEST WITH CONTRAST TECHNIQUE: Multidetector CT imaging of the chest was performed using the standard protocol during bolus administration of intravenous contrast. Multiplanar CT image reconstructions and MIPs were obtained to evaluate the vascular anatomy. CONTRAST:  75 cc Isovue 370 intravenous COMPARISON:  None. FINDINGS: Cardiovascular: Central branching pulmonary artery filling defect in the lateral and posterior basal segments of the right lower lobe consistent with acute pulmonary embolism. There is cardiomegaly. Normal RV to LV ratio. No acute aortic finding. No pericardial  effusion Mediastinum/Nodes: 15 mm right thyroid nodule. Lungs/Pleura: Ground-glass density in the right lower lobe with trace effusion. The left lung is clear. Upper Abdomen: No acute finding Musculoskeletal: No acute or aggressive finding. Critical Value/emergent results were called by telephone at the time of interpretation on 03/01/2017 at 10:31 am to Dr. Harvest Dark , who verbally acknowledged these results. Review of the MIP images confirms the above findings. IMPRESSION: 1. Positive for acute pulmonary embolism affecting 2 segmental branches in the right lower lobe. Right lower lobe infarct and atelectasis with trace effusion. 2. Cardiomegaly.  Normal RV to LV ratio. 3. 15 mm right thyroid nodule. Sonographic follow-up is recommended, especially given patient age. Electronically Signed   By: Monte Fantasia M.D.   On: 03/01/2017 10:33   US Venous Img Lower Bilateral  Result Date: 03/01/2017 CLINICAL DATA:  Bilateral leg swelling. Acute pulmonary embolism on a recent chest CTA. EXAM: BILATERAL LOWER EXTREMITY VENOUS DOPPLER ULTRASOUND TECHNIQUE: Gray-scale sonography with graded compression, as well as color Doppler and duplex ultrasound were performed to evaluate the lower extremity deep venous systems from the level of the common femoral vein and including the common femoral, femoral, profunda femoral, popliteal and calf veins including the posterior tibial, peroneal and gastrocnemius veins when visible. The superficial great saphenous vein was also interrogated. Spectral Doppler was utilized to evaluate flow at rest and with distal augmentation maneuvers in the common femoral, femoral and popliteal veins. COMPARISON:  None. FINDINGS: RIGHT LOWER EXTREMITY Common Femoral Vein: No evidence of thrombus. Normal compressibility, respiratory phasicity and response to augmentation. Saphenofemoral Junction: No evidence of thrombus. Normal compressibility and flow on color Doppler imaging. Profunda Femoral  Vein: No evidence of thrombus. Normal compressibility and flow on color Doppler imaging. Femoral Vein: No evidence of thrombus. Normal compressibility, respiratory phasicity and response to augmentation. Popliteal Vein: No evidence of thrombus. Normal compressibility, respiratory phasicity and response to augmentation. Calf Veins: No evidence of thrombus. Normal compressibility and flow on color Doppler imaging. Superficial Great Saphenous Vein: No evidence of thrombus. Normal compressibility and flow on color Doppler imaging. Venous Reflux:  None. Other Findings:  None. LEFT LOWER EXTREMITY Common Femoral Vein: No evidence of thrombus. Normal compressibility, respiratory phasicity and response to augmentation. Saphenofemoral Junction: No evidence of thrombus. Normal compressibility and flow on color Doppler imaging. Profunda Femoral Vein: No evidence of thrombus. Normal compressibility and flow on color Doppler imaging. Femoral Vein: No evidence of thrombus. Normal compressibility, respiratory phasicity and response to augmentation. Popliteal Vein: No evidence of thrombus. Normal compressibility, respiratory phasicity and response to augmentation. Calf Veins: No evidence of thrombus. Normal compressibility and flow on color Doppler imaging. Superficial Great Saphenous Vein: No evidence of thrombus. Normal compressibility and flow on color Doppler imaging. Venous Reflux:  None. Other Findings:  None.  IMPRESSION: Normal examination. No evidence of DVT within either lower extremity. Electronically Signed   By: Claudie Revering M.D.   On: 03/01/2017 12:36   US Thyroid  Addendum Date: 03/02/2017   ADDENDUM REPORT: 03/02/2017 16:35 ADDENDUM: Characteristics of the thyroid nodule in the inferior right lobe were reviewed for potential biopsy. The patient is currently hospitalized with acute pulmonary embolism and is being anticoagulated. Echogenicity of the nodule is essentially nearly isoechoic and this would decrease the  TI-RADS risk category to a TR3 lesion. At the lesion size, a TR3 lesion would warrant a 1 year follow-up ultrasound rather than immediate biopsy. Given that the patient is recovering from pulmonary embolism and will be anticoagulated for at least 6 months, it would be reasonable to perform a 1 year follow-up ultrasound on this nodule rather than proceeding with immediate fine-needle aspiration. Ultrasound characteristics of this nodule are not highly suspicious for malignancy. I discussed this by telephone with Dr. Margaretmary Eddy. Electronically Signed   By: Aletta Edouard M.D.   On: 03/02/2017 16:35   Result Date: 03/02/2017 CLINICAL DATA:  Incidental on CT. Right thyroid nodule seen on chest CT. EXAM: THYROID ULTRASOUND TECHNIQUE: Ultrasound examination of the thyroid gland and adjacent soft tissues was performed. COMPARISON:  Chest CT 03/01/2017 FINDINGS: Parenchymal Echotexture: Normal Isthmus: 0.3 cm in the AP dimension. Right lobe: 5.4 x 2.0 x 2.2 cm. Left lobe: 4.9 x 1.7 x 1.8 cm _________________________________________________________ Estimated total number of nodules >/= 1 cm: 1 Number of spongiform nodules >/=  2 cm not described below (TR1): 0 Number of mixed cystic and solid nodules >/= 1.5 cm not described below ([place]): 0 _________________________________________________________ Nodule # 1: Location: Right; Inferior Maximum size: 1.7 cm; Other 2 dimensions: 1.3 x 1.3 cm Composition: solid/almost completely solid (2) Echogenicity: hypoechoic (2) Shape: not taller-than-wide (0) Margins: ill-defined (0) Echogenic foci: none (0) ACR TI-RADS total points: 4. ACR TI-RADS risk category: TR4 (4-6 points). ACR TI-RADS recommendations: **Given size (>/= 1.5 cm) and appearance, fine needle aspiration of this moderately suspicious nodule should be considered based on TI-RADS criteria. _________________________________________________________ No discrete nodules in left thyroid lobe. IMPRESSION: 1.7 cm nodule in the  inferior right thyroid lobe. This corresponds with the recent CT findings. This nodule does meet criteria for ultrasound-guided biopsy. The above is in keeping with the ACR TI-RADS recommendations - J Am Coll Radiol 2017;14:587-595. Electronically Signed: By: Markus Daft M.D. On: 03/01/2017 12:36    ASSESSMENT: Lupus nephritis  PLAN:    1. Lupus nephritis: Proceed with cycle 3 of 500 mg/m of IV Cytoxan today. Patient will also receive 10 mg IV Decadron and 0.25 mg Aloxi as premedications. Patient expressed understanding that all of her laboratory work will be monitored by nephrology. If she has any questions or concerns regarding her lupus nephritis or her treatments should be deferred to them as well. Plan is to give 5-6 additional treatments every 28 days. Return to clinic in 1 month for consideration of her next infusion of Cytoxan. 2. Anemia: Mild, likely secondary to chronic renal insufficiency. Monitor. 3. Chronic renal insufficiency: Treatment per nephrology. Cytoxan as above. Patient states she has an appointment later today.   Patient expressed understanding and was in agreement with this plan. She also understands that She can call clinic at any time with any questions, concerns, or complaints.   Lloyd Huger, MD   03/21/2017 8:23 AM

## 2017-03-20 ENCOUNTER — Inpatient Hospital Stay: Payer: Medicaid Other | Attending: Oncology | Admitting: Oncology

## 2017-03-20 ENCOUNTER — Inpatient Hospital Stay: Payer: Medicaid Other

## 2017-03-20 VITALS — BP 104/72 | HR 90 | Temp 98.3°F | Resp 18 | Wt 109.4 lb

## 2017-03-20 DIAGNOSIS — Z5111 Encounter for antineoplastic chemotherapy: Secondary | ICD-10-CM | POA: Diagnosis not present

## 2017-03-20 DIAGNOSIS — I10 Essential (primary) hypertension: Secondary | ICD-10-CM | POA: Diagnosis not present

## 2017-03-20 DIAGNOSIS — Z880 Allergy status to penicillin: Secondary | ICD-10-CM | POA: Diagnosis not present

## 2017-03-20 DIAGNOSIS — N189 Chronic kidney disease, unspecified: Secondary | ICD-10-CM | POA: Diagnosis not present

## 2017-03-20 DIAGNOSIS — Z7952 Long term (current) use of systemic steroids: Secondary | ICD-10-CM

## 2017-03-20 DIAGNOSIS — Z79899 Other long term (current) drug therapy: Secondary | ICD-10-CM | POA: Diagnosis not present

## 2017-03-20 DIAGNOSIS — M3214 Glomerular disease in systemic lupus erythematosus: Secondary | ICD-10-CM | POA: Diagnosis not present

## 2017-03-20 DIAGNOSIS — D649 Anemia, unspecified: Secondary | ICD-10-CM | POA: Diagnosis not present

## 2017-03-20 DIAGNOSIS — Z87442 Personal history of urinary calculi: Secondary | ICD-10-CM

## 2017-03-20 DIAGNOSIS — F17210 Nicotine dependence, cigarettes, uncomplicated: Secondary | ICD-10-CM

## 2017-03-20 DIAGNOSIS — N179 Acute kidney failure, unspecified: Secondary | ICD-10-CM

## 2017-03-20 LAB — COMPREHENSIVE METABOLIC PANEL
ALK PHOS: 57 U/L (ref 38–126)
ALT: 10 U/L — ABNORMAL LOW (ref 14–54)
ANION GAP: 9 (ref 5–15)
AST: 17 U/L (ref 15–41)
Albumin: 3.1 g/dL — ABNORMAL LOW (ref 3.5–5.0)
BUN: 19 mg/dL (ref 6–20)
CALCIUM: 8.6 mg/dL — AB (ref 8.9–10.3)
CO2: 24 mmol/L (ref 22–32)
Chloride: 105 mmol/L (ref 101–111)
Creatinine, Ser: 1.45 mg/dL — ABNORMAL HIGH (ref 0.44–1.00)
GFR calc Af Amer: 54 mL/min — ABNORMAL LOW (ref >60)
GFR calc non Af Amer: 47 mL/min — ABNORMAL LOW (ref >60)
Glucose, Bld: 99 mg/dL (ref 65–99)
Potassium: 3.9 mmol/L (ref 3.5–5.1)
SODIUM: 138 mmol/L (ref 135–145)
Total Bilirubin: 0.2 mg/dL — ABNORMAL LOW (ref 0.3–1.2)
Total Protein: 6.7 g/dL (ref 6.5–8.1)

## 2017-03-20 LAB — CBC WITH DIFFERENTIAL/PLATELET
Basophils Absolute: 0.1 10*3/uL (ref 0–0.1)
Basophils Relative: 1 %
EOS ABS: 0 10*3/uL (ref 0–0.7)
EOS PCT: 0 %
HCT: 24.9 % — ABNORMAL LOW (ref 35.0–47.0)
Hemoglobin: 8.3 g/dL — ABNORMAL LOW (ref 12.0–16.0)
LYMPHS ABS: 1.6 10*3/uL (ref 1.0–3.6)
Lymphocytes Relative: 14 %
MCH: 27.9 pg (ref 26.0–34.0)
MCHC: 33.4 g/dL (ref 32.0–36.0)
MCV: 83.3 fL (ref 80.0–100.0)
Monocytes Absolute: 0.6 10*3/uL (ref 0.2–0.9)
Monocytes Relative: 6 %
Neutro Abs: 9.2 10*3/uL — ABNORMAL HIGH (ref 1.4–6.5)
Neutrophils Relative %: 79 %
Platelets: 288 10*3/uL (ref 150–440)
RBC: 2.99 MIL/uL — ABNORMAL LOW (ref 3.80–5.20)
RDW: 16.5 % — ABNORMAL HIGH (ref 11.5–14.5)
WBC: 11.5 10*3/uL — ABNORMAL HIGH (ref 3.6–11.0)

## 2017-03-20 MED ORDER — DEXAMETHASONE SODIUM PHOSPHATE 10 MG/ML IJ SOLN
10.0000 mg | Freq: Once | INTRAMUSCULAR | Status: AC
  Administered 2017-03-20: 10 mg via INTRAVENOUS
  Filled 2017-03-20: qty 1

## 2017-03-20 MED ORDER — PALONOSETRON HCL INJECTION 0.25 MG/5ML
0.2500 mg | Freq: Once | INTRAVENOUS | Status: AC
  Administered 2017-03-20: 0.25 mg via INTRAVENOUS
  Filled 2017-03-20: qty 5

## 2017-03-20 MED ORDER — SODIUM CHLORIDE 0.9 % IV SOLN
Freq: Once | INTRAVENOUS | Status: AC
  Administered 2017-03-20: 10:00:00 via INTRAVENOUS
  Filled 2017-03-20: qty 1000

## 2017-03-20 MED ORDER — SODIUM CHLORIDE 0.9 % IV SOLN
500.0000 mg/m2 | Freq: Once | INTRAVENOUS | Status: AC
  Administered 2017-03-20: 740 mg via INTRAVENOUS
  Filled 2017-03-20: qty 37

## 2017-03-20 NOTE — Progress Notes (Signed)
Patient is here for follow up, she is doing well her appetite is 50% but she is doing ok.

## 2017-03-21 ENCOUNTER — Other Ambulatory Visit: Payer: Self-pay

## 2017-03-21 MED ORDER — TAMSULOSIN HCL 0.4 MG PO CAPS: 0.4000 mg | ORAL_CAPSULE | Freq: Every day | ORAL | 2 refills | Status: DC

## 2017-03-23 ENCOUNTER — Ambulatory Visit: Payer: Medicaid Other | Admitting: Urology

## 2017-04-16 NOTE — Progress Notes (Deleted)
[place]  Telephone:(336) [phone] Fax:(336) [phone]  ID: Lin Givens OB: [birth year]  MR#: [id number]  BDZ#:[id number]  Patient Care Team: Letta Median, MD as PCP - General (Family Medicine) Center, [place] (General Practice)  CHIEF COMPLAINT: Lupus nephritis.  INTERVAL HISTORY: Patient returns to clinic today for further evaluation and cycle 3 of monthly Cytoxan. She currently feels well and is asymptomatic. She has no neurologic complaints. She denies any recent fevers or illnesses. She has a good appetite and denies weight loss. She has no chest pain. She denies any nausea, vomiting, constipation, or diarrhea. She has no urinary complaints. Patient offers no further specific complaints today.  REVIEW OF SYSTEMS:   Review of Systems  Constitutional: Negative.  Negative for fever, malaise/fatigue and weight loss.  Respiratory: Negative for cough and shortness of breath.   Cardiovascular: Negative.  Negative for chest pain and leg swelling.  Gastrointestinal: Negative.  Negative for abdominal pain.  Genitourinary: Negative.   Musculoskeletal: Negative.   Skin: Negative.  Negative for rash.  Neurological: Negative for weakness.  Psychiatric/Behavioral: Negative.  The patient is not nervous/anxious.     As per HPI. Otherwise, a complete review of systems is negative.  PAST MEDICAL HISTORY: Past Medical History:  Diagnosis Date  . Acute renal failure (ARF) ([place])   . Anemia   . History of kidney stones   . Hypertension    OFF BP MEDS X 3 WEEKS DUE TO BP CONTROL  . 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 Rash   . Staghorn calculus   . 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[place]) 10/2016   DX DURING HOSPITAL ADMISSION    PAST SURGICAL HISTORY: Past Surgical History:  Procedure Laterality Date  . CYSTOSCOPY/URETEROSCOPY/HOLMIUM LASER/STENT PLACEMENT Right 02/21/2017   Procedure: CYSTOSCOPY/URETEROSCOPY/HOLMIUM LASER/STENT  EXCHANGE;  Surgeon: [name], MD;  Location: [facility] ORS;  Service: Urology;  Laterality: Right;  . IR NEPHROSTOMY PLACEMENT RIGHT  01/23/2017  . NEPHROLITHOTOMY Right 01/24/2017   Procedure: NEPHROLITHOTOMY PERCUTANEOUS;  Surgeon: [name], MD;  Location: [facility] ORS;  Service: Urology;  Laterality: Right;  . TUBAL LIGATION      FAMILY HISTORY: Family History  Problem Relation Age of Onset  . Thyroid disease Mother   . Thyroid disease Father   . Diabetes Father     ADVANCED DIRECTIVES (Y/N):  N  HEALTH MAINTENANCE: Social History  Substance Use Topics  . Smoking status: Current Every Day Smoker    Packs/day: 1.00    Years: 15.00    Types: Cigarettes  . Smokeless tobacco: Never Used  . Alcohol use No     Colonoscopy:  PAP:  Bone density:  Lipid panel:  Allergies  Allergen Reactions  . Penicillins     Yeast infection Has patient had a PCN reaction causing immediate rash, facial/tongue/throat swelling, SOB or lightheadedness with hypotension:No Has patient had a PCN reaction causing severe rash involving mucus membranes or skin necrosis: Yes Has patient had a PCN reaction that required hospitalization No Has patient had a PCN reaction occurring within the last 10 years: No If all of the above answers are "NO", then may proceed with Cephalosporin use.     Current Outpatient Prescriptions  Medication Sig Dispense Refill  . acetaminophen (TYLENOL) 500 MG tablet Take 1-2 tablets (500-1,000 mg total) by mouth every 6 (six) hours as needed for mild pain. 30 tablet 0  . amLODipine (NORVASC) 5 MG tablet Take 5 mg by mouth daily.    Marland Kitchen  apixaban (ELIQUIS) 5 MG TABS tablet Take 2 tablets (10 mg total) by mouth 2 (two) times daily. 10 mg by mouth twice a day 12 doses followed by 5 mg twice a day 60 tablet 0  . Cyclophosphamide (CYTOXAN LYOPHILIZED IV) Inject 1 Dose into the vein every 30 (thirty) days.    Marland Kitchen docusate sodium (COLACE) 100 MG capsule Take 1 capsule (100 mg total)  by mouth 2 (two) times daily. (Patient not taking: Reported on 03/20/2017) 60 capsule 0  . HYDROcodone-acetaminophen (NORCO/VICODIN) 5-325 MG tablet Take 1-2 tablets by mouth every 6 (six) hours as needed for moderate pain. (Patient not taking: Reported on 03/20/2017) 15 tablet 0  . hydroxychloroquine (PLAQUENIL) 200 MG tablet Take 1 tablet (200 mg total) by mouth daily. (Patient taking differently: Take 200 mg by mouth every evening. ) 30 tablet 0  . oxybutynin (DITROPAN) 5 MG tablet Take 1 tablet (5 mg total) by mouth every 8 (eight) hours as needed for bladder spasms. (Patient not taking: Reported on 03/20/2017) 30 tablet 0  . predniSONE (DELTASONE) 10 MG tablet TAKE 1 TABLET BY MOUTH ONCE A DAY IN THE MORNING  1  . sulfamethoxazole-trimethoprim (BACTRIM DS,SEPTRA DS) 800-160 MG tablet Take 1 tablet by mouth every 12 (twelve) hours. 14 tablet 0  . tamsulosin (FLOMAX) 0.4 MG CAPS capsule Take 1 capsule (0.4 mg total) by mouth daily. 30 capsule 2   No current facility-administered medications for this visit.     OBJECTIVE: There were no vitals filed for this visit.   There is no height or weight on file to calculate BMI.    ECOG FS:0 - Asymptomatic  General: Well-developed, well-nourished, no acute distress. Eyes: Pink conjunctiva, anicteric sclera. Lungs: Clear to auscultation bilaterally. Heart: Regular rate and rhythm. No rubs, murmurs, or gallops. Abdomen: Soft, nontender, nondistended. No organomegaly noted, normoactive bowel sounds. Musculoskeletal: No edema, cyanosis, or clubbing. Neuro: Alert, answering all questions appropriately. Cranial nerves grossly intact. Skin: No rashes or petechiae noted. Psych: Normal affect.   LAB RESULTS:  Lab Results  Component Value Date   NA 138 03/20/2017   K 3.9 03/20/2017   CL 105 03/20/2017   CO2 24 03/20/2017   GLUCOSE 99 03/20/2017   BUN 19 03/20/2017   CREATININE 1.45 (H) 03/20/2017   CALCIUM 8.6 (L) 03/20/2017   PROT 6.7 03/20/2017    ALBUMIN 3.1 (L) 03/20/2017   AST 17 03/20/2017   ALT 10 (L) 03/20/2017   ALKPHOS 57 03/20/2017   BILITOT 0.2 (L) 03/20/2017   GFRNONAA 47 (L) 03/20/2017   GFRAA 54 (L) 03/20/2017    Lab Results  Component Value Date   WBC 11.5 (H) 03/20/2017   NEUTROABS 9.2 (H) 03/20/2017   HGB 8.3 (L) 03/20/2017   HCT 24.9 (L) 03/20/2017   MCV 83.3 03/20/2017   PLT 288 03/20/2017     STUDIES: No results found.  ASSESSMENT: Lupus nephritis  PLAN:    1. Lupus nephritis: Proceed with cycle 3 of 500 mg/m of IV Cytoxan today. Patient will also receive 10 mg IV Decadron and 0.25 mg Aloxi as premedications. Patient expressed understanding that all of her laboratory work will be monitored by nephrology. If she has any questions or concerns regarding her lupus nephritis or her treatments should be deferred to them as well. Plan is to give 5-6 additional treatments every 28 days. Return to clinic in 1 month for consideration of her next infusion of Cytoxan. 2. Anemia: Mild, likely secondary to chronic renal insufficiency. Monitor. 3.  Chronic renal insufficiency: Treatment per nephrology. Cytoxan as above. Patient states she has an appointment later today.   Patient expressed understanding and was in agreement with this plan. She also understands that She can call clinic at any time with any questions, concerns, or complaints.   Lloyd Huger, MD   04/16/2017 10:41 PM

## 2017-04-17 ENCOUNTER — Inpatient Hospital Stay: Payer: Medicaid Other

## 2017-04-17 ENCOUNTER — Inpatient Hospital Stay: Payer: Medicaid Other | Admitting: Oncology

## 2017-04-19 NOTE — Progress Notes (Signed)
[place]  Telephone:(336) [phone] Fax:(336) [phone]  ID: Patricia Maldonado OB: [birth year]  MR#: [id number]  IHK#:[id number]  Patient Care Team: Letta Median, MD as PCP - General (Family Medicine) Center, [place] (General Practice)  CHIEF COMPLAINT: Lupus nephritis.  INTERVAL HISTORY: Patient returns to clinic today for further evaluation and cycle 4 of monthly Cytoxan. She currently feels well and is asymptomatic. She was evaluated by nephrology earlier this week. She has no neurologic complaints. She denies any recent fevers or illnesses. She has a good appetite and denies weight loss. She has no chest pain. She denies any nausea, vomiting, constipation, or diarrhea. She has no urinary complaints. Patient offers no further specific complaints today.  REVIEW OF SYSTEMS:   Review of Systems  Constitutional: Negative.  Negative for fever, malaise/fatigue and weight loss.  Respiratory: Negative for cough and shortness of breath.   Cardiovascular: Negative.  Negative for chest pain and leg swelling.  Gastrointestinal: Negative.  Negative for abdominal pain.  Genitourinary: Negative.   Musculoskeletal: Negative.   Skin: Negative.  Negative for rash.  Neurological: Negative for weakness.  Psychiatric/Behavioral: Negative.  The patient is not nervous/anxious.     As per HPI. Otherwise, a complete review of systems is negative.  PAST MEDICAL HISTORY: Past Medical History:  Diagnosis Date  . Acute renal failure (ARF) ([place])   . Anemia   . History of kidney stones   . Hypertension    OFF BP MEDS X 3 WEEKS DUE TO BP CONTROL  . 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 Rash   . Staghorn calculus   . 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 CYSTOSCOPY/URETEROSCOPY/HOLMIUM LASER/STENT PLACEMENT Right 02/21/2017   Procedure: CYSTOSCOPY/URETEROSCOPY/HOLMIUM LASER/STENT EXCHANGE;  Surgeon: [name], MD;  Location: [facility] ORS;  Service: Urology;  Laterality: Right;  . IR NEPHROSTOMY PLACEMENT RIGHT  01/23/2017  . NEPHROLITHOTOMY Right 01/24/2017   Procedure: NEPHROLITHOTOMY PERCUTANEOUS;  Surgeon: [name], MD;  Location: [facility] ORS;  Service: Urology;  Laterality: Right;  . TUBAL LIGATION      FAMILY HISTORY: Family History  Problem Relation Age of Onset  . Thyroid disease Mother   . Thyroid disease Father   . Diabetes Father     ADVANCED DIRECTIVES (Y/N):  N  HEALTH MAINTENANCE: Social History  Substance Use Topics  . Smoking status: Current Every Day Smoker    Packs/day: 1.00    Years: 15.00    Types: Cigarettes  . Smokeless tobacco: Never Used  . Alcohol use No     Colonoscopy:  PAP:  Bone density:  Lipid panel:  Allergies  Allergen Reactions  . Penicillins     Yeast infection Has patient had a PCN reaction causing immediate rash, facial/tongue/throat swelling, SOB or lightheadedness with hypotension:No Has patient had a PCN reaction causing severe rash involving mucus membranes or skin necrosis: Yes Has patient had a PCN reaction that required hospitalization No Has patient had a PCN reaction occurring within the last 10 years: No If all of the above answers are "NO", then may proceed with Cephalosporin use.     Current Outpatient Prescriptions  Medication Sig Dispense Refill  . acetaminophen (TYLENOL) 500 MG tablet Take 1-2 tablets (500-1,000 mg total) by mouth every 6 (six) hours as needed for mild pain. 30 tablet 0  . amLODipine (NORVASC) 5 MG tablet Take  5 mg by mouth daily.    Marland Kitchen apixaban (ELIQUIS) 5 MG TABS tablet Take 2 tablets (10 mg total) by mouth 2 (two) times daily. 10 mg by mouth twice a day 12 doses followed by 5 mg twice a day 60 tablet 0  . Cyclophosphamide (CYTOXAN LYOPHILIZED IV) Inject 1 Dose into the vein every 30 (thirty) days.    Marland Kitchen docusate  sodium (COLACE) 100 MG capsule Take 1 capsule (100 mg total) by mouth 2 (two) times daily. 60 capsule 0  . hydroxychloroquine (PLAQUENIL) 200 MG tablet Take 1 tablet (200 mg total) by mouth daily. (Patient taking differently: Take 200 mg by mouth every evening. ) 30 tablet 0  . predniSONE (DELTASONE) 10 MG tablet TAKE 1 TABLET BY MOUTH ONCE A DAY IN THE MORNING  1  . tamsulosin (FLOMAX) 0.4 MG CAPS capsule Take 1 capsule (0.4 mg total) by mouth daily. 30 capsule 2  . HYDROcodone-acetaminophen (NORCO/VICODIN) 5-325 MG tablet Take 1-2 tablets by mouth every 6 (six) hours as needed for moderate pain. (Patient not taking: Reported on 03/20/2017) 15 tablet 0  . oxybutynin (DITROPAN) 5 MG tablet Take 1 tablet (5 mg total) by mouth every 8 (eight) hours as needed for bladder spasms. (Patient not taking: Reported on 03/20/2017) 30 tablet 0  . sulfamethoxazole-trimethoprim (BACTRIM DS,SEPTRA DS) 800-160 MG tablet Take 1 tablet by mouth every 12 (twelve) hours. (Patient not taking: Reported on 04/20/2017) 14 tablet 0   No current facility-administered medications for this visit.     OBJECTIVE: Vitals:   04/20/17 0921  BP: 99/71  Pulse: 85  Temp: (!) 97.5 F (36.4 C)     Body mass index is 19.84 kg/m.    ECOG FS:0 - Asymptomatic  General: Well-developed, well-nourished, no acute distress. Eyes: Pink conjunctiva, anicteric sclera. Lungs: Clear to auscultation bilaterally. Heart: Regular rate and rhythm. No rubs, murmurs, or gallops. Abdomen: Soft, nontender, nondistended. No organomegaly noted, normoactive bowel sounds. Musculoskeletal: No edema, cyanosis, or clubbing. Neuro: Alert, answering all questions appropriately. Cranial nerves grossly intact. Skin: No rashes or petechiae noted. Psych: Normal affect.   LAB RESULTS:  Lab Results  Component Value Date   NA 140 04/20/2017   K 3.6 04/20/2017   CL 114 (H) 04/20/2017   CO2 22 04/20/2017   GLUCOSE 87 04/20/2017   BUN 32 (H) 04/20/2017    CREATININE 1.63 (H) 04/20/2017   CALCIUM 8.8 (L) 04/20/2017   PROT 7.4 04/20/2017   ALBUMIN 3.5 04/20/2017   AST 17 04/20/2017   ALT 11 (L) 04/20/2017   ALKPHOS 44 04/20/2017   BILITOT 0.4 04/20/2017   GFRNONAA 40 (L) 04/20/2017   GFRAA 47 (L) 04/20/2017    Lab Results  Component Value Date   WBC 7.2 04/20/2017   NEUTROABS 5.7 04/20/2017   HGB 8.9 (L) 04/20/2017   HCT 27.2 (L) 04/20/2017   MCV 81.8 04/20/2017   PLT 251 04/20/2017     STUDIES: No results found.  ASSESSMENT: Lupus nephritis  PLAN:    1. Lupus nephritis: Proceed with cycle 4 of 500 mg/m of IV Cytoxan today. Patient will also receive 10 mg IV Decadron and 0.25 mg Aloxi as premedications. Patient expressed understanding that all of her laboratory work will be monitored by nephrology. If she has any questions or concerns regarding her lupus nephritis or her treatments should be deferred to them as well. Plan is to give 5-6 additional treatments every 28 days. Return to clinic in 4 weeks for consideration of her next  infusion of Cytoxan. 2. Anemia: Mild, likely secondary to chronic renal insufficiency. Monitor. 3. Chronic renal insufficiency: Treatment per nephrology. Cytoxan as above. Patient states she has an appointment later today.   Patient expressed understanding and was in agreement with this plan. She also understands that She can call clinic at any time with any questions, concerns, or complaints.   Lloyd Huger, MD   04/23/2017 2:58 PM

## 2017-04-20 ENCOUNTER — Inpatient Hospital Stay (HOSPITAL_BASED_OUTPATIENT_CLINIC_OR_DEPARTMENT_OTHER): Payer: Medicaid Other | Admitting: Oncology

## 2017-04-20 ENCOUNTER — Inpatient Hospital Stay: Payer: Medicaid Other | Attending: Oncology

## 2017-04-20 ENCOUNTER — Inpatient Hospital Stay: Payer: Medicaid Other

## 2017-04-20 VITALS — BP 99/71 | HR 85 | Temp 97.5°F | Wt 108.5 lb

## 2017-04-20 DIAGNOSIS — Z880 Allergy status to penicillin: Secondary | ICD-10-CM | POA: Insufficient documentation

## 2017-04-20 DIAGNOSIS — M3214 Glomerular disease in systemic lupus erythematosus: Secondary | ICD-10-CM | POA: Insufficient documentation

## 2017-04-20 DIAGNOSIS — Z7952 Long term (current) use of systemic steroids: Secondary | ICD-10-CM

## 2017-04-20 DIAGNOSIS — N170 Acute kidney failure with tubular necrosis: Secondary | ICD-10-CM

## 2017-04-20 DIAGNOSIS — Z5111 Encounter for antineoplastic chemotherapy: Secondary | ICD-10-CM | POA: Diagnosis not present

## 2017-04-20 DIAGNOSIS — D649 Anemia, unspecified: Secondary | ICD-10-CM | POA: Diagnosis not present

## 2017-04-20 DIAGNOSIS — Z79899 Other long term (current) drug therapy: Secondary | ICD-10-CM | POA: Insufficient documentation

## 2017-04-20 DIAGNOSIS — I129 Hypertensive chronic kidney disease with stage 1 through stage 4 chronic kidney disease, or unspecified chronic kidney disease: Secondary | ICD-10-CM | POA: Diagnosis not present

## 2017-04-20 DIAGNOSIS — F17210 Nicotine dependence, cigarettes, uncomplicated: Secondary | ICD-10-CM | POA: Diagnosis not present

## 2017-04-20 DIAGNOSIS — N189 Chronic kidney disease, unspecified: Secondary | ICD-10-CM

## 2017-04-20 DIAGNOSIS — Z87442 Personal history of urinary calculi: Secondary | ICD-10-CM

## 2017-04-20 DIAGNOSIS — N179 Acute kidney failure, unspecified: Secondary | ICD-10-CM

## 2017-04-20 LAB — COMPREHENSIVE METABOLIC PANEL
ALK PHOS: 44 U/L (ref 38–126)
ALT: 11 U/L — AB (ref 14–54)
ANION GAP: 4 — AB (ref 5–15)
AST: 17 U/L (ref 15–41)
Albumin: 3.5 g/dL (ref 3.5–5.0)
BILIRUBIN TOTAL: 0.4 mg/dL (ref 0.3–1.2)
BUN: 32 mg/dL — ABNORMAL HIGH (ref 6–20)
CALCIUM: 8.8 mg/dL — AB (ref 8.9–10.3)
CO2: 22 mmol/L (ref 22–32)
Chloride: 114 mmol/L — ABNORMAL HIGH (ref 101–111)
Creatinine, Ser: 1.63 mg/dL — ABNORMAL HIGH (ref 0.44–1.00)
GFR calc non Af Amer: 40 mL/min — ABNORMAL LOW (ref >60)
GFR, EST AFRICAN AMERICAN: 47 mL/min — AB (ref >60)
Glucose, Bld: 87 mg/dL (ref 65–99)
Potassium: 3.6 mmol/L (ref 3.5–5.1)
SODIUM: 140 mmol/L (ref 135–145)
TOTAL PROTEIN: 7.4 g/dL (ref 6.5–8.1)

## 2017-04-20 LAB — CBC WITH DIFFERENTIAL/PLATELET
Basophils Absolute: 0.1 10*3/uL (ref 0–0.1)
Basophils Relative: 1 %
EOS ABS: 0.1 10*3/uL (ref 0–0.7)
EOS PCT: 1 %
HCT: 27.2 % — ABNORMAL LOW (ref 35.0–47.0)
HEMOGLOBIN: 8.9 g/dL — AB (ref 12.0–16.0)
LYMPHS ABS: 1 10*3/uL (ref 1.0–3.6)
Lymphocytes Relative: 15 %
MCH: 26.8 pg (ref 26.0–34.0)
MCHC: 32.8 g/dL (ref 32.0–36.0)
MCV: 81.8 fL (ref 80.0–100.0)
MONOS PCT: 5 %
Monocytes Absolute: 0.4 10*3/uL (ref 0.2–0.9)
NEUTROS PCT: 78 %
Neutro Abs: 5.7 10*3/uL (ref 1.4–6.5)
Platelets: 251 10*3/uL (ref 150–440)
RBC: 3.33 MIL/uL — ABNORMAL LOW (ref 3.80–5.20)
RDW: 17.9 % — ABNORMAL HIGH (ref 11.5–14.5)
WBC: 7.2 10*3/uL (ref 3.6–11.0)

## 2017-04-20 MED ORDER — SODIUM CHLORIDE 0.9 % IV SOLN
Freq: Once | INTRAVENOUS | Status: AC
  Administered 2017-04-20: 11:00:00 via INTRAVENOUS
  Filled 2017-04-20: qty 1000

## 2017-04-20 MED ORDER — PALONOSETRON HCL INJECTION 0.25 MG/5ML
0.2500 mg | Freq: Once | INTRAVENOUS | Status: AC
  Administered 2017-04-20: 0.25 mg via INTRAVENOUS
  Filled 2017-04-20: qty 5

## 2017-04-20 MED ORDER — DEXAMETHASONE SODIUM PHOSPHATE 10 MG/ML IJ SOLN
10.0000 mg | Freq: Once | INTRAMUSCULAR | Status: AC
  Administered 2017-04-20: 10 mg via INTRAVENOUS
  Filled 2017-04-20: qty 1

## 2017-04-20 MED ORDER — SODIUM CHLORIDE 0.9 % IV SOLN
500.0000 mg/m2 | Freq: Once | INTRAVENOUS | Status: AC
  Administered 2017-04-20: 740 mg via INTRAVENOUS
  Filled 2017-04-20: qty 37

## 2017-04-20 NOTE — Progress Notes (Signed)
Patient does not offer any problems today.  

## 2017-05-17 NOTE — Progress Notes (Signed)
[place]  Telephone:(336) [phone] Fax:(336) [phone]  ID: Patricia Maldonado OB: 12-Feb-1981  MR#: [id number]  BHA#:[id number]  Patient Care Team: Letta Median, MD as PCP - General (Family Medicine) Center, [place] (General Practice)  CHIEF COMPLAINT: Lupus nephritis.  INTERVAL HISTORY: Patient returns to clinic today for further evaluation and cycle 5 of monthly Cytoxan. She is tearful today stating personal issues unrelated to her treatments, but would not further elaborate. She otherwise feels well and is asymptomatic. She missed her nephrology appointment yesterday secondary to transportation issues. She has no neurologic complaints. She denies any recent fevers or illnesses. She has a good appetite and denies weight loss. She has no chest pain. She denies any nausea, vomiting, constipation, or diarrhea. She has no urinary complaints. Patient offers no further specific complaints today.  REVIEW OF SYSTEMS:   Review of Systems  Constitutional: Negative.  Negative for fever, malaise/fatigue and weight loss.  Respiratory: Negative for cough and shortness of breath.   Cardiovascular: Negative.  Negative for chest pain and leg swelling.  Gastrointestinal: Negative.  Negative for abdominal pain.  Genitourinary: Negative.   Musculoskeletal: Negative.   Skin: Negative.  Negative for rash.  Neurological: Negative for weakness.  Psychiatric/Behavioral: Positive for depression. The patient is not nervous/anxious.     As per HPI. Otherwise, a complete review of systems is negative.  PAST MEDICAL HISTORY: Past Medical History:  Diagnosis Date  . Acute renal failure (ARF) ([place])   . Anemia   . History of kidney stones   . Hypertension    OFF BP MEDS X 3 WEEKS DUE TO BP CONTROL  . 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 Rash   . Staghorn calculus   . 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 CYSTOSCOPY/URETEROSCOPY/HOLMIUM LASER/STENT PLACEMENT Right 02/21/2017   Procedure: CYSTOSCOPY/URETEROSCOPY/HOLMIUM LASER/STENT EXCHANGE;  Surgeon: [name], MD;  Location: [facility] ORS;  Service: Urology;  Laterality: Right;  . IR NEPHROSTOMY PLACEMENT RIGHT  01/23/2017  . NEPHROLITHOTOMY Right 01/24/2017   Procedure: NEPHROLITHOTOMY PERCUTANEOUS;  Surgeon: [name], MD;  Location: [facility] ORS;  Service: Urology;  Laterality: Right;  . TUBAL LIGATION      FAMILY HISTORY: Family History  Problem Relation Age of Onset  . Thyroid disease Mother   . Thyroid disease Father   . Diabetes Father     ADVANCED DIRECTIVES (Y/N):  N  HEALTH MAINTENANCE: Social History  Substance Use Topics  . Smoking status: Current Every Day Smoker    Packs/day: 1.00    Years: 15.00    Types: Cigarettes  . Smokeless tobacco: Never Used  . Alcohol use No     Colonoscopy:  PAP:  Bone density:  Lipid panel:  Allergies  Allergen Reactions  . Penicillins     Yeast infection Has patient had a PCN reaction causing immediate rash, facial/tongue/throat swelling, SOB or lightheadedness with hypotension:No Has patient had a PCN reaction causing severe rash involving mucus membranes or skin necrosis: Yes Has patient had a PCN reaction that required hospitalization No Has patient had a PCN reaction occurring within the last 10 years: No If all of the above answers are "NO", then may proceed with Cephalosporin use.     Current Outpatient Prescriptions  Medication Sig Dispense Refill  . acetaminophen (TYLENOL) 500 MG tablet Take 1-2 tablets (500-1,000 mg total) by mouth  every 6 (six) hours as needed for mild pain. 30 tablet 0  . amLODipine (NORVASC) 5 MG tablet Take 5 mg by mouth daily.    . Cyclophosphamide (CYTOXAN LYOPHILIZED IV) Inject 1 Dose into the vein every 30 (thirty) days.    Marland Kitchen docusate sodium (COLACE) 100 MG  capsule Take 1 capsule (100 mg total) by mouth 2 (two) times daily. 60 capsule 0  . HYDROcodone-acetaminophen (NORCO/VICODIN) 5-325 MG tablet Take 1-2 tablets by mouth every 6 (six) hours as needed for moderate pain. 15 tablet 0  . hydroxychloroquine (PLAQUENIL) 200 MG tablet Take 1 tablet (200 mg total) by mouth daily. (Patient taking differently: Take 200 mg by mouth every evening. ) 30 tablet 0  . oxybutynin (DITROPAN) 5 MG tablet Take 1 tablet (5 mg total) by mouth every 8 (eight) hours as needed for bladder spasms. 30 tablet 0  . predniSONE (DELTASONE) 10 MG tablet TAKE 1 TABLET BY MOUTH ONCE A DAY IN THE MORNING  1  . sulfamethoxazole-trimethoprim (BACTRIM DS,SEPTRA DS) 800-160 MG tablet Take 1 tablet by mouth every 12 (twelve) hours. 14 tablet 0  . tamsulosin (FLOMAX) 0.4 MG CAPS capsule Take 1 capsule (0.4 mg total) by mouth daily. 30 capsule 2   No current facility-administered medications for this visit.    Facility-Administered Medications Ordered in Other Visits  Medication Dose Route Frequency Provider Last Rate Last Dose  . heparin lock flush 100 unit/mL  500 Units Intracatheter Once PRN Lloyd Huger, MD        OBJECTIVE: Vitals:   05/18/17 1034  BP: 125/82  Pulse: 83  Resp: 18  Temp: (!) 97.5 F (36.4 C)     Body mass index is 19.81 kg/m.    ECOG FS:0 - Asymptomatic  General: Well-developed, well-nourished, no acute distress. Eyes: Pink conjunctiva, anicteric sclera. Lungs: Clear to auscultation bilaterally. Heart: Regular rate and rhythm. No rubs, murmurs, or gallops. Abdomen: Soft, nontender, nondistended. No organomegaly noted, normoactive bowel sounds. Musculoskeletal: No edema, cyanosis, or clubbing. Neuro: Alert, answering all questions appropriately. Cranial nerves grossly intact. Skin: No rashes or petechiae noted. Psych: Normal affect.   LAB RESULTS:  Lab Results  Component Value Date   NA 136 05/18/2017   K 3.6 05/18/2017   CL 110 05/18/2017     CO2 22 05/18/2017   GLUCOSE 87 05/18/2017   BUN 25 (H) 05/18/2017   CREATININE 1.55 (H) 05/18/2017   CALCIUM 9.1 05/18/2017   PROT 7.6 05/18/2017   ALBUMIN 3.8 05/18/2017   AST 21 05/18/2017   ALT 13 (L) 05/18/2017   ALKPHOS 47 05/18/2017   BILITOT 0.6 05/18/2017   GFRNONAA 43 (L) 05/18/2017   GFRAA 50 (L) 05/18/2017    Lab Results  Component Value Date   WBC 8.4 05/18/2017   NEUTROABS 6.9 (H) 05/18/2017   HGB 9.5 (L) 05/18/2017   HCT 29.1 (L) 05/18/2017   MCV 80.0 05/18/2017   PLT 215 05/18/2017     STUDIES: No results found.  ASSESSMENT: Lupus nephritis  PLAN:    1. Lupus nephritis: Proceed with cycle 5 of 500 mg/m of IV Cytoxan today. Patient will also receive 10 mg IV Decadron and 0.25 mg Aloxi as premedications. Patient expressed understanding that all of her laboratory work will be monitored by nephrology. If she has any questions or concerns regarding her lupus nephritis or her treatments should be deferred to them as well. Plan is to give 6 treatments every 28 days. Return to clinic in 4 weeks for  consideration of her next and final infusion of Cytoxan. 2. Anemia: Mild, likely secondary to chronic renal insufficiency. Monitor. 3. Chronic renal insufficiency: Treatment per nephrology. Cytoxan as above. Patient states she has an appointment later today.   Patient expressed understanding and was in agreement with this plan. She also understands that She can call clinic at any time with any questions, concerns, or complaints.   Lloyd Huger, MD   05/19/2017 1:09 PM

## 2017-05-18 ENCOUNTER — Inpatient Hospital Stay: Payer: Medicaid Other

## 2017-05-18 ENCOUNTER — Other Ambulatory Visit: Payer: Self-pay | Admitting: *Deleted

## 2017-05-18 ENCOUNTER — Inpatient Hospital Stay: Payer: Medicaid Other | Attending: Oncology

## 2017-05-18 ENCOUNTER — Inpatient Hospital Stay (HOSPITAL_BASED_OUTPATIENT_CLINIC_OR_DEPARTMENT_OTHER): Payer: Medicaid Other | Admitting: Oncology

## 2017-05-18 VITALS — BP 125/82 | HR 83 | Temp 97.5°F | Resp 18 | Wt 108.3 lb

## 2017-05-18 DIAGNOSIS — D649 Anemia, unspecified: Secondary | ICD-10-CM | POA: Insufficient documentation

## 2017-05-18 DIAGNOSIS — N189 Chronic kidney disease, unspecified: Secondary | ICD-10-CM | POA: Insufficient documentation

## 2017-05-18 DIAGNOSIS — N179 Acute kidney failure, unspecified: Secondary | ICD-10-CM

## 2017-05-18 DIAGNOSIS — I129 Hypertensive chronic kidney disease with stage 1 through stage 4 chronic kidney disease, or unspecified chronic kidney disease: Secondary | ICD-10-CM | POA: Diagnosis not present

## 2017-05-18 DIAGNOSIS — F17210 Nicotine dependence, cigarettes, uncomplicated: Secondary | ICD-10-CM | POA: Insufficient documentation

## 2017-05-18 DIAGNOSIS — Z79899 Other long term (current) drug therapy: Secondary | ICD-10-CM | POA: Insufficient documentation

## 2017-05-18 DIAGNOSIS — F329 Major depressive disorder, single episode, unspecified: Secondary | ICD-10-CM

## 2017-05-18 DIAGNOSIS — Z880 Allergy status to penicillin: Secondary | ICD-10-CM

## 2017-05-18 DIAGNOSIS — Z87442 Personal history of urinary calculi: Secondary | ICD-10-CM | POA: Insufficient documentation

## 2017-05-18 DIAGNOSIS — Z5111 Encounter for antineoplastic chemotherapy: Secondary | ICD-10-CM | POA: Diagnosis present

## 2017-05-18 DIAGNOSIS — M3214 Glomerular disease in systemic lupus erythematosus: Secondary | ICD-10-CM

## 2017-05-18 DIAGNOSIS — Z7952 Long term (current) use of systemic steroids: Secondary | ICD-10-CM | POA: Insufficient documentation

## 2017-05-18 LAB — CBC WITH DIFFERENTIAL/PLATELET
Basophils Absolute: 0.1 10*3/uL (ref 0–0.1)
Basophils Relative: 1 %
EOS PCT: 0 %
Eosinophils Absolute: 0 10*3/uL (ref 0–0.7)
HEMATOCRIT: 29.1 % — AB (ref 35.0–47.0)
Hemoglobin: 9.5 g/dL — ABNORMAL LOW (ref 12.0–16.0)
LYMPHS ABS: 0.9 10*3/uL — AB (ref 1.0–3.6)
LYMPHS PCT: 10 %
MCH: 26.2 pg (ref 26.0–34.0)
MCHC: 32.7 g/dL (ref 32.0–36.0)
MCV: 80 fL (ref 80.0–100.0)
MONO ABS: 0.5 10*3/uL (ref 0.2–0.9)
MONOS PCT: 6 %
NEUTROS ABS: 6.9 10*3/uL — AB (ref 1.4–6.5)
Neutrophils Relative %: 83 %
Platelets: 215 10*3/uL (ref 150–440)
RBC: 3.63 MIL/uL — ABNORMAL LOW (ref 3.80–5.20)
RDW: 18.4 % — AB (ref 11.5–14.5)
WBC: 8.4 10*3/uL (ref 3.6–11.0)

## 2017-05-18 LAB — COMPREHENSIVE METABOLIC PANEL
ALT: 13 U/L — ABNORMAL LOW (ref 14–54)
AST: 21 U/L (ref 15–41)
Albumin: 3.8 g/dL (ref 3.5–5.0)
Alkaline Phosphatase: 47 U/L (ref 38–126)
Anion gap: 4 — ABNORMAL LOW (ref 5–15)
BILIRUBIN TOTAL: 0.6 mg/dL (ref 0.3–1.2)
BUN: 25 mg/dL — AB (ref 6–20)
CO2: 22 mmol/L (ref 22–32)
CREATININE: 1.55 mg/dL — AB (ref 0.44–1.00)
Calcium: 9.1 mg/dL (ref 8.9–10.3)
Chloride: 110 mmol/L (ref 101–111)
GFR, EST AFRICAN AMERICAN: 50 mL/min — AB (ref >60)
GFR, EST NON AFRICAN AMERICAN: 43 mL/min — AB (ref >60)
Glucose, Bld: 87 mg/dL (ref 65–99)
POTASSIUM: 3.6 mmol/L (ref 3.5–5.1)
Sodium: 136 mmol/L (ref 135–145)
TOTAL PROTEIN: 7.6 g/dL (ref 6.5–8.1)

## 2017-05-18 MED ORDER — SODIUM CHLORIDE 0.9 % IV SOLN: 10.0000 mg | Freq: Once | INTRAVENOUS | Status: DC

## 2017-05-18 MED ORDER — DEXAMETHASONE SODIUM PHOSPHATE 10 MG/ML IJ SOLN
10.0000 mg | Freq: Once | INTRAMUSCULAR | Status: AC
  Administered 2017-05-18: 10 mg via INTRAVENOUS
  Filled 2017-05-18: qty 1

## 2017-05-18 MED ORDER — SODIUM CHLORIDE 0.9 % IV SOLN
Freq: Once | INTRAVENOUS | Status: AC
  Administered 2017-05-18: 12:00:00 via INTRAVENOUS
  Filled 2017-05-18: qty 1000

## 2017-05-18 MED ORDER — HEPARIN SOD (PORK) LOCK FLUSH 100 UNIT/ML IV SOLN: 500.0000 [IU] | Freq: Once | INTRAVENOUS | Status: DC | PRN

## 2017-05-18 MED ORDER — PALONOSETRON HCL INJECTION 0.25 MG/5ML
0.2500 mg | Freq: Once | INTRAVENOUS | Status: AC
  Administered 2017-05-18: 0.25 mg via INTRAVENOUS
  Filled 2017-05-18: qty 5

## 2017-05-18 MED ORDER — CYCLOPHOSPHAMIDE CHEMO INJECTION 1 GM
500.0000 mg/m2 | Freq: Once | INTRAMUSCULAR | Status: AC
  Administered 2017-05-18: 740 mg via INTRAVENOUS
  Filled 2017-05-18: qty 37

## 2017-05-18 NOTE — Progress Notes (Signed)
Patient is here for follow up, she is upset but not sure what is causing her little distress

## 2017-06-14 NOTE — Progress Notes (Deleted)
[place]  Telephone:(336) [phone] Fax:(336) [phone]  ID: Patricia Maldonado OB: 1981/02/17  MR#: [id number]  QBH#:[id number]  Patient Care Team: Letta Median, MD as PCP - General (Family Medicine) Center, [place] (General Practice)  CHIEF COMPLAINT: Lupus nephritis.  INTERVAL HISTORY: Patient returns to clinic today for further evaluation and cycle 5 of monthly Cytoxan. She is tearful today stating personal issues unrelated to her treatments, but would not further elaborate. She otherwise feels well and is asymptomatic. She missed her nephrology appointment yesterday secondary to transportation issues. She has no neurologic complaints. She denies any recent fevers or illnesses. She has a good appetite and denies weight loss. She has no chest pain. She denies any nausea, vomiting, constipation, or diarrhea. She has no urinary complaints. Patient offers no further specific complaints today.  REVIEW OF SYSTEMS:   Review of Systems  Constitutional: Negative.  Negative for fever, malaise/fatigue and weight loss.  Respiratory: Negative for cough and shortness of breath.   Cardiovascular: Negative.  Negative for chest pain and leg swelling.  Gastrointestinal: Negative.  Negative for abdominal pain.  Genitourinary: Negative.   Musculoskeletal: Negative.   Skin: Negative.  Negative for rash.  Neurological: Negative for weakness.  Psychiatric/Behavioral: Positive for depression. The patient is not nervous/anxious.     As per HPI. Otherwise, a complete review of systems is negative.  PAST MEDICAL HISTORY: Past Medical History:  Diagnosis Date  . Acute renal failure (ARF) ([place])   . Anemia   . History of kidney stones   . Hypertension    OFF BP MEDS X 3 WEEKS DUE TO BP CONTROL  . 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 Rash   . Staghorn calculus   . 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 CYSTOSCOPY/URETEROSCOPY/HOLMIUM LASER/STENT PLACEMENT Right 02/21/2017   Procedure: CYSTOSCOPY/URETEROSCOPY/HOLMIUM LASER/STENT EXCHANGE;  Surgeon: [name], MD;  Location: [facility] ORS;  Service: Urology;  Laterality: Right;  . IR NEPHROSTOMY PLACEMENT RIGHT  01/23/2017  . NEPHROLITHOTOMY Right 01/24/2017   Procedure: NEPHROLITHOTOMY PERCUTANEOUS;  Surgeon: [name], MD;  Location: [facility] ORS;  Service: Urology;  Laterality: Right;  . TUBAL LIGATION      FAMILY HISTORY: Family History  Problem Relation Age of Onset  . Thyroid disease Mother   . Thyroid disease Father   . Diabetes Father     ADVANCED DIRECTIVES (Y/N):  N  HEALTH MAINTENANCE: Social History  Substance Use Topics  . Smoking status: Current Every Day Smoker    Packs/day: 1.00    Years: 15.00    Types: Cigarettes  . Smokeless tobacco: Never Used  . Alcohol use No     Colonoscopy:  PAP:  Bone density:  Lipid panel:  Allergies  Allergen Reactions  . Penicillins     Yeast infection Has patient had a PCN reaction causing immediate rash, facial/tongue/throat swelling, SOB or lightheadedness with hypotension:No Has patient had a PCN reaction causing severe rash involving mucus membranes or skin necrosis: Yes Has patient had a PCN reaction that required hospitalization No Has patient had a PCN reaction occurring within the last 10 years: No If all of the above answers are "NO", then may proceed with Cephalosporin use.     Current Outpatient Prescriptions  Medication Sig Dispense Refill  . acetaminophen (TYLENOL) 500 MG tablet Take 1-2 tablets (500-1,000 mg total) by mouth  every 6 (six) hours as needed for mild pain. 30 tablet 0  . amLODipine (NORVASC) 5 MG tablet Take 5 mg by mouth daily.    . Cyclophosphamide (CYTOXAN LYOPHILIZED IV) Inject 1 Dose into the vein every 30 (thirty) days.    Marland Kitchen docusate sodium (COLACE) 100 MG  capsule Take 1 capsule (100 mg total) by mouth 2 (two) times daily. 60 capsule 0  . HYDROcodone-acetaminophen (NORCO/VICODIN) 5-325 MG tablet Take 1-2 tablets by mouth every 6 (six) hours as needed for moderate pain. 15 tablet 0  . hydroxychloroquine (PLAQUENIL) 200 MG tablet Take 1 tablet (200 mg total) by mouth daily. (Patient taking differently: Take 200 mg by mouth every evening. ) 30 tablet 0  . oxybutynin (DITROPAN) 5 MG tablet Take 1 tablet (5 mg total) by mouth every 8 (eight) hours as needed for bladder spasms. 30 tablet 0  . predniSONE (DELTASONE) 10 MG tablet TAKE 1 TABLET BY MOUTH ONCE A DAY IN THE MORNING  1  . sulfamethoxazole-trimethoprim (BACTRIM DS,SEPTRA DS) 800-160 MG tablet Take 1 tablet by mouth every 12 (twelve) hours. 14 tablet 0  . tamsulosin (FLOMAX) 0.4 MG CAPS capsule Take 1 capsule (0.4 mg total) by mouth daily. 30 capsule 2   No current facility-administered medications for this visit.     OBJECTIVE: There were no vitals filed for this visit.   There is no height or weight on file to calculate BMI.    ECOG FS:0 - Asymptomatic  General: Well-developed, well-nourished, no acute distress. Eyes: Pink conjunctiva, anicteric sclera. Lungs: Clear to auscultation bilaterally. Heart: Regular rate and rhythm. No rubs, murmurs, or gallops. Abdomen: Soft, nontender, nondistended. No organomegaly noted, normoactive bowel sounds. Musculoskeletal: No edema, cyanosis, or clubbing. Neuro: Alert, answering all questions appropriately. Cranial nerves grossly intact. Skin: No rashes or petechiae noted. Psych: Normal affect.   LAB RESULTS:  Lab Results  Component Value Date   NA 136 05/18/2017   K 3.6 05/18/2017   CL 110 05/18/2017   CO2 22 05/18/2017   GLUCOSE 87 05/18/2017   BUN 25 (H) 05/18/2017   CREATININE 1.55 (H) 05/18/2017   CALCIUM 9.1 05/18/2017   PROT 7.6 05/18/2017   ALBUMIN 3.8 05/18/2017   AST 21 05/18/2017   ALT 13 (L) 05/18/2017   ALKPHOS 47 05/18/2017    BILITOT 0.6 05/18/2017   GFRNONAA 43 (L) 05/18/2017   GFRAA 50 (L) 05/18/2017    Lab Results  Component Value Date   WBC 8.4 05/18/2017   NEUTROABS 6.9 (H) 05/18/2017   HGB 9.5 (L) 05/18/2017   HCT 29.1 (L) 05/18/2017   MCV 80.0 05/18/2017   PLT 215 05/18/2017     STUDIES: No results found.  ASSESSMENT: Lupus nephritis  PLAN:    1. Lupus nephritis: Proceed with cycle 5 of 500 mg/m of IV Cytoxan today. Patient will also receive 10 mg IV Decadron and 0.25 mg Aloxi as premedications. Patient expressed understanding that all of her laboratory work will be monitored by nephrology. If she has any questions or concerns regarding her lupus nephritis or her treatments should be deferred to them as well. Plan is to give 6 treatments every 28 days. Return to clinic in 4 weeks for consideration of her next and final infusion of Cytoxan. 2. Anemia: Mild, likely secondary to chronic renal insufficiency. Monitor. 3. Chronic renal insufficiency: Treatment per nephrology. Cytoxan as above. Patient states she has an appointment later today.   Patient expressed understanding and was in agreement with this plan. She  also understands that She can call clinic at any time with any questions, concerns, or complaints.   Lloyd Huger, MD   06/14/2017 8:51 PM

## 2017-06-15 ENCOUNTER — Inpatient Hospital Stay: Payer: Medicaid Other

## 2017-06-15 ENCOUNTER — Inpatient Hospital Stay: Payer: Medicaid Other | Admitting: Oncology

## 2017-06-20 NOTE — Progress Notes (Deleted)
[place]  Telephone:(336) [phone] [phone]) [phone]  ID: Lin Givens OB: [birth year]  MR#: [id number]  GQQ#:[id number]  Patient Care Team: Letta Median, MD as PCP - General (Family Medicine) Center, [place] (General Practice)  CHIEF COMPLAINT: Lupus nephritis.  INTERVAL HISTORY: Patient returns to clinic today for further evaluation and cycle 5 of monthly Cytoxan. She is tearful today stating personal issues unrelated to her treatments, but would not further elaborate. She otherwise feels well and is asymptomatic. She missed her nephrology appointment yesterday secondary to transportation issues. She has no neurologic complaints. She denies any recent fevers or illnesses. She has a good appetite and denies weight loss. She has no chest pain. She denies any nausea, vomiting, constipation, or diarrhea. She has no urinary complaints. Patient offers no further specific complaints today.  REVIEW OF SYSTEMS:   Review of Systems  Constitutional: Negative.  Negative for fever, malaise/fatigue and weight loss.  Respiratory: Negative for cough and shortness of breath.   Cardiovascular: Negative.  Negative for chest pain and leg swelling.  Gastrointestinal: Negative.  Negative for abdominal pain.  Genitourinary: Negative.   Musculoskeletal: Negative.   Skin: Negative.  Negative for rash.  Neurological: Negative for weakness.  Psychiatric/Behavioral: Positive for depression. The patient is not nervous/anxious.     As per HPI. Otherwise, a complete review of systems is negative.  PAST MEDICAL HISTORY: Past Medical History:  Diagnosis Date  . Acute renal failure (ARF) ([place])   . Anemia   . History of kidney stones   . Hypertension    OFF BP MEDS X 3 WEEKS DUE TO BP CONTROL  . 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 Rash   . Staghorn calculus   . 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[place]) 10/2016   DX DURING HOSPITAL ADMISSION     PAST SURGICAL HISTORY: Past Surgical History:  Procedure Laterality Date  . CYSTOSCOPY/URETEROSCOPY/HOLMIUM LASER/STENT PLACEMENT Right 02/21/2017   Procedure: CYSTOSCOPY/URETEROSCOPY/HOLMIUM LASER/STENT EXCHANGE;  Surgeon: [name], MD;  Location: [facility] ORS;  Service: Urology;  Laterality: Right;  . IR NEPHROSTOMY PLACEMENT RIGHT  01/23/2017  . NEPHROLITHOTOMY Right 01/24/2017   Procedure: NEPHROLITHOTOMY PERCUTANEOUS;  Surgeon: [name], MD;  Location: [facility] ORS;  Service: Urology;  Laterality: Right;  . TUBAL LIGATION      FAMILY HISTORY: Family History  Problem Relation Age of Onset  . Thyroid disease Mother   . Thyroid disease Father   . Diabetes Father     ADVANCED DIRECTIVES (Y/N):  N  HEALTH MAINTENANCE: Social History  Substance Use Topics  . Smoking status: Current Every Day Smoker    Packs/day: 1.00    Years: 15.00    Types: Cigarettes  . Smokeless tobacco: Never Used  . Alcohol use No     Colonoscopy:  PAP:  Bone density:  Lipid panel:  Allergies  Allergen Reactions  . Penicillins     Yeast infection Has patient had a PCN reaction causing immediate rash, facial/tongue/throat swelling, SOB or lightheadedness with hypotension:No Has patient had a PCN reaction causing severe rash involving mucus membranes or skin necrosis: Yes Has patient had a PCN reaction that required hospitalization No Has patient had a PCN reaction occurring within the last 10 years: No If all of the above answers are "NO", then may proceed with Cephalosporin use.     Current Outpatient Prescriptions  Medication Sig Dispense Refill  . acetaminophen (TYLENOL) 500 MG tablet Take 1-2 tablets (500-1,000 mg total) by mouth  every 6 (six) hours as needed for mild pain. 30 tablet 0  . amLODipine (NORVASC) 5 MG tablet Take 5 mg by mouth daily.    . Cyclophosphamide (CYTOXAN LYOPHILIZED IV) Inject 1 Dose into the vein every 30 (thirty) days.    Marland Kitchen docusate sodium (COLACE) 100 MG  capsule Take 1 capsule (100 mg total) by mouth 2 (two) times daily. 60 capsule 0  . HYDROcodone-acetaminophen (NORCO/VICODIN) 5-325 MG tablet Take 1-2 tablets by mouth every 6 (six) hours as needed for moderate pain. 15 tablet 0  . hydroxychloroquine (PLAQUENIL) 200 MG tablet Take 1 tablet (200 mg total) by mouth daily. (Patient taking differently: Take 200 mg by mouth every evening. ) 30 tablet 0  . oxybutynin (DITROPAN) 5 MG tablet Take 1 tablet (5 mg total) by mouth every 8 (eight) hours as needed for bladder spasms. 30 tablet 0  . predniSONE (DELTASONE) 10 MG tablet TAKE 1 TABLET BY MOUTH ONCE A DAY IN THE MORNING  1  . sulfamethoxazole-trimethoprim (BACTRIM DS,SEPTRA DS) 800-160 MG tablet Take 1 tablet by mouth every 12 (twelve) hours. 14 tablet 0  . tamsulosin (FLOMAX) 0.4 MG CAPS capsule Take 1 capsule (0.4 mg total) by mouth daily. 30 capsule 2   No current facility-administered medications for this visit.     OBJECTIVE: There were no vitals filed for this visit.   There is no height or weight on file to calculate BMI.    ECOG FS:0 - Asymptomatic  General: Well-developed, well-nourished, no acute distress. Eyes: Pink conjunctiva, anicteric sclera. Lungs: Clear to auscultation bilaterally. Heart: Regular rate and rhythm. No rubs, murmurs, or gallops. Abdomen: Soft, nontender, nondistended. No organomegaly noted, normoactive bowel sounds. Musculoskeletal: No edema, cyanosis, or clubbing. Neuro: Alert, answering all questions appropriately. Cranial nerves grossly intact. Skin: No rashes or petechiae noted. Psych: Normal affect.   LAB RESULTS:  Lab Results  Component Value Date   NA 136 05/18/2017   K 3.6 05/18/2017   CL 110 05/18/2017   CO2 22 05/18/2017   GLUCOSE 87 05/18/2017   BUN 25 (H) 05/18/2017   CREATININE 1.55 (H) 05/18/2017   CALCIUM 9.1 05/18/2017   PROT 7.6 05/18/2017   ALBUMIN 3.8 05/18/2017   AST 21 05/18/2017   ALT 13 (L) 05/18/2017   ALKPHOS 47 05/18/2017    BILITOT 0.6 05/18/2017   GFRNONAA 43 (L) 05/18/2017   GFRAA 50 (L) 05/18/2017    Lab Results  Component Value Date   WBC 8.4 05/18/2017   NEUTROABS 6.9 (H) 05/18/2017   HGB 9.5 (L) 05/18/2017   HCT 29.1 (L) 05/18/2017   MCV 80.0 05/18/2017   PLT 215 05/18/2017     STUDIES: No results found.  ASSESSMENT: Lupus nephritis  PLAN:    1. Lupus nephritis: Proceed with cycle 5 of 500 mg/m of IV Cytoxan today. Patient will also receive 10 mg IV Decadron and 0.25 mg Aloxi as premedications. Patient expressed understanding that all of her laboratory work will be monitored by nephrology. If she has any questions or concerns regarding her lupus nephritis or her treatments should be deferred to them as well. Plan is to give 6 treatments every 28 days. Return to clinic in 4 weeks for consideration of her next and final infusion of Cytoxan. 2. Anemia: Mild, likely secondary to chronic renal insufficiency. Monitor. 3. Chronic renal insufficiency: Treatment per nephrology. Cytoxan as above. Patient states she has an appointment later today.   Patient expressed understanding and was in agreement with this plan. She  also understands that She can call clinic at any time with any questions, concerns, or complaints.   Lloyd Huger, MD   06/20/2017 10:07 PM

## 2017-06-21 ENCOUNTER — Other Ambulatory Visit: Payer: Self-pay

## 2017-06-21 DIAGNOSIS — M3214 Glomerular disease in systemic lupus erythematosus: Secondary | ICD-10-CM

## 2017-06-22 ENCOUNTER — Inpatient Hospital Stay: Payer: Medicaid Other

## 2017-06-22 ENCOUNTER — Inpatient Hospital Stay: Payer: Medicaid Other | Admitting: Oncology

## 2017-06-25 NOTE — Progress Notes (Signed)
[place]  Telephone:(336) [phone] Fax:(336) [phone]  ID: Lin Givens OB: 1981-03-14  MR#: [id number]  TOI#:[id number]  Patient Care Team: Letta Median, MD as PCP - General (Family Medicine) Center, [place] (General Practice)  CHIEF COMPLAINT: Lupus nephritis.  INTERVAL HISTORY: Patient returns to clinic today for further evaluation and cycle 6 of monthly Cytoxan. She currnetly feels well and is asymptomatic. She has no neurologic complaints. She denies any recent fevers or illnesses. She has a good appetite and denies weight loss. She has no chest pain, shortness of breath, cough, or hemoptysis.. She denies any nausea, vomiting, constipation, or diarrhea. She has no urinary complaints. Patient offers no specific complaints today.  REVIEW OF SYSTEMS:   Review of Systems  Constitutional: Negative.  Negative for fever, malaise/fatigue and weight loss.  Respiratory: Negative for cough and shortness of breath.   Cardiovascular: Negative.  Negative for chest pain and leg swelling.  Gastrointestinal: Negative.  Negative for abdominal pain.  Genitourinary: Negative.   Musculoskeletal: Negative.   Skin: Negative.  Negative for rash.  Neurological: Negative for weakness.  Psychiatric/Behavioral: Positive for depression. The patient is not nervous/anxious.     As per HPI. Otherwise, a complete review of systems is negative.  PAST MEDICAL HISTORY: Past Medical History:  Diagnosis Date  . Acute renal failure (ARF) ([place])   . Anemia   . History of kidney stones   . Hypertension    OFF BP MEDS X 3 WEEKS DUE TO BP CONTROL  . 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 Rash   . Staghorn calculus   . 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[place]) 10/2016   DX DURING HOSPITAL ADMISSION    PAST SURGICAL HISTORY: Past Surgical History:  Procedure Laterality Date  . CYSTOSCOPY/URETEROSCOPY/HOLMIUM LASER/STENT PLACEMENT Right 02/21/2017   Procedure: CYSTOSCOPY/URETEROSCOPY/HOLMIUM LASER/STENT EXCHANGE;  Surgeon: [name], MD;  Location: [facility] ORS;  Service: Urology;  Laterality: Right;  . IR NEPHROSTOMY PLACEMENT RIGHT  01/23/2017  . NEPHROLITHOTOMY Right 01/24/2017   Procedure: NEPHROLITHOTOMY PERCUTANEOUS;  Surgeon: [name], MD;  Location: [facility] ORS;  Service: Urology;  Laterality: Right;  . TUBAL LIGATION      FAMILY HISTORY: Family History  Problem Relation Age of Onset  . Thyroid disease Mother   . Thyroid disease Father   . Diabetes Father     ADVANCED DIRECTIVES (Y/N):  N  HEALTH MAINTENANCE: Social History  Substance Use Topics  . Smoking status: Current Every Day Smoker    Packs/day: 1.00    Years: 15.00    Types: Cigarettes  . Smokeless tobacco: Never Used  . Alcohol use No     Colonoscopy:  PAP:  Bone density:  Lipid panel:  Allergies  Allergen Reactions  . Penicillins     Yeast infection Has patient had a PCN reaction causing immediate rash, facial/tongue/throat swelling, SOB or lightheadedness with hypotension:No Has patient had a PCN reaction causing severe rash involving mucus membranes or skin necrosis: Yes Has patient had a PCN reaction that required hospitalization No Has patient had a PCN reaction occurring within the last 10 years: No If all of the above answers are "NO", then may proceed with Cephalosporin use.     Current Outpatient Prescriptions  Medication Sig Dispense Refill  . amLODipine (NORVASC) 5 MG tablet Take 5 mg by mouth daily.    . Cyclophosphamide (CYTOXAN LYOPHILIZED IV) Inject 1 Dose into the vein every 30 (thirty) days.    . hydroxychloroquine (PLAQUENIL)  200 MG tablet Take 1 tablet (200 mg total) by mouth daily. (Patient taking differently: Take 200 mg by mouth every evening. ) 30 tablet 0  . oxybutynin (DITROPAN) 5 MG tablet Take 1 tablet (5 mg total) by mouth every 8 (eight) hours as needed for bladder spasms. 30 tablet 0  . predniSONE (DELTASONE) 10  MG tablet TAKE 1 TABLET BY MOUTH ONCE A DAY IN THE MORNING  1  . acetaminophen (TYLENOL) 500 MG tablet Take 1-2 tablets (500-1,000 mg total) by mouth every 6 (six) hours as needed for mild pain. (Patient not taking: Reported on 06/26/2017) 30 tablet 0  . docusate sodium (COLACE) 100 MG capsule Take 1 capsule (100 mg total) by mouth 2 (two) times daily. (Patient not taking: Reported on 06/26/2017) 60 capsule 0  . HYDROcodone-acetaminophen (NORCO/VICODIN) 5-325 MG tablet Take 1-2 tablets by mouth every 6 (six) hours as needed for moderate pain. (Patient not taking: Reported on 06/26/2017) 15 tablet 0  . sulfamethoxazole-trimethoprim (BACTRIM DS,SEPTRA DS) 800-160 MG tablet Take 1 tablet by mouth every 12 (twelve) hours. (Patient not taking: Reported on 06/26/2017) 14 tablet 0  . tamsulosin (FLOMAX) 0.4 MG CAPS capsule Take 1 capsule (0.4 mg total) by mouth daily. (Patient not taking: Reported on 06/26/2017) 30 capsule 2   No current facility-administered medications for this visit.     OBJECTIVE: Vitals:   06/26/17 1417  BP: 118/78  Pulse: 96  Resp: 20  Temp: 97.6 F (36.4 C)     Body mass index is 20.23 kg/m.    ECOG FS:0 - Asymptomatic  General: Well-developed, well-nourished, no acute distress. Eyes: Pink conjunctiva, anicteric sclera. Lungs: Clear to auscultation bilaterally. Heart: Regular rate and rhythm. No rubs, murmurs, or gallops. Abdomen: Soft, nontender, nondistended. No organomegaly noted, normoactive bowel sounds. Musculoskeletal: No edema, cyanosis, or clubbing. Neuro: Alert, answering all questions appropriately. Cranial nerves grossly intact. Skin: No rashes or petechiae noted. Psych: Normal affect.   LAB RESULTS:  Lab Results  Component Value Date   NA 140 06/26/2017   K 3.2 (L) 06/26/2017   CL 109 06/26/2017   CO2 25 06/26/2017   GLUCOSE 94 06/26/2017   BUN 19 06/26/2017   CREATININE 1.62 (H) 06/26/2017   CALCIUM 8.8 (L) 06/26/2017   PROT 6.6 06/26/2017    ALBUMIN 3.4 (L) 06/26/2017   AST 22 06/26/2017   ALT 11 (L) 06/26/2017   ALKPHOS 49 06/26/2017   BILITOT 0.3 06/26/2017   GFRNONAA 41 (L) 06/26/2017   GFRAA 47 (L) 06/26/2017    Lab Results  Component Value Date   WBC 5.1 06/26/2017   NEUTROABS 3.8 06/26/2017   HGB 9.3 (L) 06/26/2017   HCT 28.1 (L) 06/26/2017   MCV 83.2 06/26/2017   PLT 177 06/26/2017     STUDIES: No results found.  ASSESSMENT: Lupus nephritis  PLAN:    1. Lupus nephritis: Proceed with cycle 6 of 500 mg/m of IV Cytoxan today. Patient will also receive 10 mg IV Decadron and 0.25 mg Aloxi as premedications. Patient expressed understanding that all of her laboratory work will be monitored by nephrology. If she has any questions or concerns regarding her lupus nephritis or her treatments should be deferred to them as well. Today was patient's final planned treatment and no further follow-up has been scheduled. 2. Anemia: Mild, likely secondary to chronic renal insufficiency. Monitor. 3. Chronic renal insufficiency: Treatment per nephrology. Cytoxan as above.    Patient expressed understanding and was in agreement with this plan. She also  understands that She can call clinic at any time with any questions, concerns, or complaints.   Lloyd Huger, MD   06/30/2017 10:06 AM

## 2017-06-26 ENCOUNTER — Inpatient Hospital Stay: Payer: Medicaid Other | Attending: Oncology

## 2017-06-26 ENCOUNTER — Inpatient Hospital Stay: Payer: Medicaid Other

## 2017-06-26 ENCOUNTER — Inpatient Hospital Stay (HOSPITAL_BASED_OUTPATIENT_CLINIC_OR_DEPARTMENT_OTHER): Payer: Medicaid Other | Admitting: Oncology

## 2017-06-26 VITALS — BP 118/78 | HR 96 | Temp 97.6°F | Resp 20 | Wt 110.6 lb

## 2017-06-26 DIAGNOSIS — Z87442 Personal history of urinary calculi: Secondary | ICD-10-CM | POA: Insufficient documentation

## 2017-06-26 DIAGNOSIS — Z5111 Encounter for antineoplastic chemotherapy: Secondary | ICD-10-CM | POA: Insufficient documentation

## 2017-06-26 DIAGNOSIS — M3214 Glomerular disease in systemic lupus erythematosus: Secondary | ICD-10-CM | POA: Diagnosis not present

## 2017-06-26 DIAGNOSIS — D649 Anemia, unspecified: Secondary | ICD-10-CM | POA: Insufficient documentation

## 2017-06-26 DIAGNOSIS — F17210 Nicotine dependence, cigarettes, uncomplicated: Secondary | ICD-10-CM

## 2017-06-26 DIAGNOSIS — Z7952 Long term (current) use of systemic steroids: Secondary | ICD-10-CM

## 2017-06-26 DIAGNOSIS — F329 Major depressive disorder, single episode, unspecified: Secondary | ICD-10-CM

## 2017-06-26 DIAGNOSIS — N189 Chronic kidney disease, unspecified: Secondary | ICD-10-CM

## 2017-06-26 DIAGNOSIS — I129 Hypertensive chronic kidney disease with stage 1 through stage 4 chronic kidney disease, or unspecified chronic kidney disease: Secondary | ICD-10-CM

## 2017-06-26 DIAGNOSIS — Z79899 Other long term (current) drug therapy: Secondary | ICD-10-CM | POA: Insufficient documentation

## 2017-06-26 DIAGNOSIS — N179 Acute kidney failure, unspecified: Secondary | ICD-10-CM

## 2017-06-26 LAB — CBC WITH DIFFERENTIAL/PLATELET
BASOS ABS: 0 10*3/uL (ref 0–0.1)
BASOS PCT: 0 %
EOS ABS: 0.1 10*3/uL (ref 0–0.7)
Eosinophils Relative: 2 %
HCT: 28.1 % — ABNORMAL LOW (ref 35.0–47.0)
HEMOGLOBIN: 9.3 g/dL — AB (ref 12.0–16.0)
Lymphocytes Relative: 16 %
Lymphs Abs: 0.8 10*3/uL — ABNORMAL LOW (ref 1.0–3.6)
MCH: 27.7 pg (ref 26.0–34.0)
MCHC: 33.3 g/dL (ref 32.0–36.0)
MCV: 83.2 fL (ref 80.0–100.0)
Monocytes Absolute: 0.4 10*3/uL (ref 0.2–0.9)
Monocytes Relative: 8 %
NEUTROS ABS: 3.8 10*3/uL (ref 1.4–6.5)
NEUTROS PCT: 74 %
PLATELETS: 177 10*3/uL (ref 150–440)
RBC: 3.37 MIL/uL — AB (ref 3.80–5.20)
RDW: 20 % — ABNORMAL HIGH (ref 11.5–14.5)
WBC: 5.1 10*3/uL (ref 3.6–11.0)

## 2017-06-26 LAB — COMPREHENSIVE METABOLIC PANEL
ALBUMIN: 3.4 g/dL — AB (ref 3.5–5.0)
ALT: 11 U/L — AB (ref 14–54)
AST: 22 U/L (ref 15–41)
Alkaline Phosphatase: 49 U/L (ref 38–126)
Anion gap: 6 (ref 5–15)
BUN: 19 mg/dL (ref 6–20)
CHLORIDE: 109 mmol/L (ref 101–111)
CO2: 25 mmol/L (ref 22–32)
CREATININE: 1.62 mg/dL — AB (ref 0.44–1.00)
Calcium: 8.8 mg/dL — ABNORMAL LOW (ref 8.9–10.3)
GFR calc non Af Amer: 41 mL/min — ABNORMAL LOW (ref >60)
GFR, EST AFRICAN AMERICAN: 47 mL/min — AB (ref >60)
GLUCOSE: 94 mg/dL (ref 65–99)
Potassium: 3.2 mmol/L — ABNORMAL LOW (ref 3.5–5.1)
SODIUM: 140 mmol/L (ref 135–145)
Total Bilirubin: 0.3 mg/dL (ref 0.3–1.2)
Total Protein: 6.6 g/dL (ref 6.5–8.1)

## 2017-06-26 MED ORDER — SODIUM CHLORIDE 0.9 % IV SOLN
Freq: Once | INTRAVENOUS | Status: AC
  Administered 2017-06-26: 15:00:00 via INTRAVENOUS
  Filled 2017-06-26: qty 1000

## 2017-06-26 MED ORDER — SODIUM CHLORIDE 0.9 % IV SOLN
500.0000 mg/m2 | Freq: Once | INTRAVENOUS | Status: AC
  Administered 2017-06-26: 740 mg via INTRAVENOUS
  Filled 2017-06-26: qty 37

## 2017-06-26 MED ORDER — DEXAMETHASONE SODIUM PHOSPHATE 10 MG/ML IJ SOLN
10.0000 mg | Freq: Once | INTRAMUSCULAR | Status: AC
Start: 2017-06-26 — End: 2017-06-26
  Administered 2017-06-26: 10 mg via INTRAVENOUS
  Filled 2017-06-26: qty 1

## 2017-06-26 MED ORDER — PALONOSETRON HCL INJECTION 0.25 MG/5ML
0.2500 mg | Freq: Once | INTRAVENOUS | Status: AC
  Administered 2017-06-26: 0.25 mg via INTRAVENOUS
  Filled 2017-06-26: qty 5

## 2017-06-26 NOTE — Progress Notes (Signed)
Patient denies any concerns today.  

## 2017-06-26 NOTE — Progress Notes (Signed)
1431: Reviewed labs with Dr. Grayland Ormond, proceed with treatment per Dr. Grayland Ormond. LJ

## 2017-07-27 ENCOUNTER — Encounter: Payer: Self-pay | Admitting: Emergency Medicine

## 2017-07-27 ENCOUNTER — Inpatient Hospital Stay
Admission: EM | Admit: 2017-07-27 | Discharge: 2017-07-30 | DRG: 684 | Disposition: A | Discharge status: Home / Self Care | Payer: Medicaid Other | Attending: Internal Medicine | Admitting: Internal Medicine

## 2017-07-27 ENCOUNTER — Emergency Department: Payer: Medicaid Other

## 2017-07-27 DIAGNOSIS — E876 Hypokalemia: Secondary | ICD-10-CM | POA: Diagnosis present

## 2017-07-27 DIAGNOSIS — Z880 Allergy status to penicillin: Secondary | ICD-10-CM | POA: Diagnosis not present

## 2017-07-27 DIAGNOSIS — I129 Hypertensive chronic kidney disease with stage 1 through stage 4 chronic kidney disease, or unspecified chronic kidney disease: Secondary | ICD-10-CM | POA: Diagnosis present

## 2017-07-27 DIAGNOSIS — D631 Anemia in chronic kidney disease: Secondary | ICD-10-CM | POA: Diagnosis present

## 2017-07-27 DIAGNOSIS — N179 Acute kidney failure, unspecified: Secondary | ICD-10-CM | POA: Diagnosis present

## 2017-07-27 DIAGNOSIS — Z79899 Other long term (current) drug therapy: Secondary | ICD-10-CM | POA: Diagnosis not present

## 2017-07-27 DIAGNOSIS — Z87442 Personal history of urinary calculi: Secondary | ICD-10-CM | POA: Diagnosis not present

## 2017-07-27 DIAGNOSIS — F17210 Nicotine dependence, cigarettes, uncomplicated: Secondary | ICD-10-CM | POA: Diagnosis present

## 2017-07-27 DIAGNOSIS — M3214 Glomerular disease in systemic lupus erythematosus: Secondary | ICD-10-CM | POA: Diagnosis present

## 2017-07-27 DIAGNOSIS — A084 Viral intestinal infection, unspecified: Secondary | ICD-10-CM | POA: Diagnosis present

## 2017-07-27 DIAGNOSIS — R51 Headache: Secondary | ICD-10-CM | POA: Diagnosis present

## 2017-07-27 DIAGNOSIS — R112 Nausea with vomiting, unspecified: Secondary | ICD-10-CM | POA: Diagnosis not present

## 2017-07-27 DIAGNOSIS — N183 Chronic kidney disease, stage 3 (moderate): Secondary | ICD-10-CM | POA: Diagnosis present

## 2017-07-27 LAB — URINALYSIS, COMPLETE (UACMP) WITH MICROSCOPIC
BACTERIA UA: NONE SEEN
BILIRUBIN URINE: NEGATIVE
Glucose, UA: NEGATIVE mg/dL
KETONES UR: NEGATIVE mg/dL
Nitrite: NEGATIVE
Specific Gravity, Urine: 1.013 (ref 1.005–1.030)
pH: 6 (ref 5.0–8.0)

## 2017-07-27 LAB — COMPREHENSIVE METABOLIC PANEL
ALBUMIN: 3.6 g/dL (ref 3.5–5.0)
ALK PHOS: 51 U/L (ref 38–126)
ALT: 9 U/L — ABNORMAL LOW (ref 14–54)
AST: 20 U/L (ref 15–41)
Anion gap: 12 (ref 5–15)
BILIRUBIN TOTAL: 0.4 mg/dL (ref 0.3–1.2)
BUN: 12 mg/dL (ref 6–20)
CHLORIDE: 106 mmol/L (ref 101–111)
CO2: 22 mmol/L (ref 22–32)
Calcium: 9.2 mg/dL (ref 8.9–10.3)
Creatinine, Ser: 2.06 mg/dL — ABNORMAL HIGH (ref 0.44–1.00)
GFR calc Af Amer: 35 mL/min — ABNORMAL LOW (ref >60)
GFR, EST NON AFRICAN AMERICAN: 30 mL/min — AB (ref >60)
Glucose, Bld: 95 mg/dL (ref 65–99)
POTASSIUM: 3.1 mmol/L — AB (ref 3.5–5.1)
Sodium: 140 mmol/L (ref 135–145)
Total Protein: 7.6 g/dL (ref 6.5–8.1)

## 2017-07-27 LAB — URINE DRUG SCREEN, QUALITATIVE (ARMC ONLY)
Amphetamines, Ur Screen: NOT DETECTED
BARBITURATES, UR SCREEN: NOT DETECTED
Benzodiazepine, Ur Scrn: NOT DETECTED
CANNABINOID 50 NG, UR ~~LOC~~: NOT DETECTED
COCAINE METABOLITE, UR ~~LOC~~: NOT DETECTED
MDMA (Ecstasy)Ur Screen: NOT DETECTED
Methadone Scn, Ur: NOT DETECTED
OPIATE, UR SCREEN: NOT DETECTED
PHENCYCLIDINE (PCP) UR S: NOT DETECTED
Tricyclic, Ur Screen: NOT DETECTED

## 2017-07-27 LAB — CBC
HEMATOCRIT: 32.3 % — AB (ref 35.0–47.0)
Hemoglobin: 10.5 g/dL — ABNORMAL LOW (ref 12.0–16.0)
MCH: 28.2 pg (ref 26.0–34.0)
MCHC: 32.6 g/dL (ref 32.0–36.0)
MCV: 86.6 fL (ref 80.0–100.0)
Platelets: 193 10*3/uL (ref 150–440)
RBC: 3.73 MIL/uL — AB (ref 3.80–5.20)
RDW: 18.6 % — ABNORMAL HIGH (ref 11.5–14.5)
WBC: 5.6 10*3/uL (ref 3.6–11.0)

## 2017-07-27 LAB — PREGNANCY, URINE: PREG TEST UR: NEGATIVE

## 2017-07-27 LAB — APTT: aPTT: 28 seconds (ref 24–36)

## 2017-07-27 LAB — LIPASE, BLOOD: Lipase: 18 U/L (ref 11–51)

## 2017-07-27 LAB — PROTIME-INR
INR: 0.96
PROTHROMBIN TIME: 12.7 s (ref 11.4–15.2)

## 2017-07-27 MED ORDER — SODIUM CHLORIDE 0.9 % IV BOLUS (SEPSIS)
1000.0000 mL | Freq: Once | INTRAVENOUS | Status: AC
  Administered 2017-07-27: 1000 mL via INTRAVENOUS

## 2017-07-27 MED ORDER — ACETAMINOPHEN 650 MG RE SUPP
650.0000 mg | Freq: Four times a day (QID) | RECTAL | Status: DC | PRN
Start: 2017-07-27 — End: 2017-07-30

## 2017-07-27 MED ORDER — HYDROXYCHLOROQUINE SULFATE 200 MG PO TABS
200.0000 mg | ORAL_TABLET | Freq: Every day | ORAL | Status: DC
  Administered 2017-07-27 – 2017-07-30 (×4): 200 mg via ORAL
  Filled 2017-07-27 (×4): qty 1

## 2017-07-27 MED ORDER — ONDANSETRON HCL 4 MG/2ML IJ SOLN
4.0000 mg | Freq: Once | INTRAMUSCULAR | Status: AC
  Administered 2017-07-27: 4 mg via INTRAVENOUS
  Filled 2017-07-27: qty 2

## 2017-07-27 MED ORDER — HYDRALAZINE HCL 20 MG/ML IJ SOLN: 10.0000 mg | Freq: Four times a day (QID) | INTRAMUSCULAR | Status: DC | PRN

## 2017-07-27 MED ORDER — POTASSIUM CHLORIDE CRYS ER 20 MEQ PO TBCR
40.0000 meq | EXTENDED_RELEASE_TABLET | Freq: Once | ORAL | Status: AC
  Administered 2017-07-27: 40 meq via ORAL
  Filled 2017-07-27: qty 2

## 2017-07-27 MED ORDER — ONDANSETRON HCL 4 MG/2ML IJ SOLN
4.0000 mg | Freq: Four times a day (QID) | INTRAMUSCULAR | Status: DC | PRN
  Administered 2017-07-28: 4 mg via INTRAVENOUS
  Filled 2017-07-27: qty 2

## 2017-07-27 MED ORDER — HYDROCODONE-ACETAMINOPHEN 5-325 MG PO TABS
1.0000 | ORAL_TABLET | ORAL | Status: DC | PRN
  Administered 2017-07-28 – 2017-07-29 (×2): 1 via ORAL
  Filled 2017-07-27 (×2): qty 1

## 2017-07-27 MED ORDER — ENOXAPARIN SODIUM 40 MG/0.4ML ~~LOC~~ SOLN
40.0000 mg | SUBCUTANEOUS | Status: DC
  Filled 2017-07-27 (×2): qty 0.4

## 2017-07-27 MED ORDER — POTASSIUM CHLORIDE IN NACL 20-0.9 MEQ/L-% IV SOLN
INTRAVENOUS | Status: DC
  Administered 2017-07-27 – 2017-07-29 (×4): via INTRAVENOUS
  Filled 2017-07-27 (×6): qty 1000

## 2017-07-27 MED ORDER — ACETAMINOPHEN 325 MG PO TABS: 650.0000 mg | ORAL_TABLET | Freq: Four times a day (QID) | ORAL | Status: DC | PRN

## 2017-07-27 MED ORDER — PREDNISONE 10 MG PO TABS
10.0000 mg | ORAL_TABLET | Freq: Every day | ORAL | Status: DC
  Administered 2017-07-28 – 2017-07-30 (×3): 10 mg via ORAL
  Filled 2017-07-27 (×3): qty 1

## 2017-07-27 MED ORDER — ONDANSETRON HCL 4 MG PO TABS: 4.0000 mg | ORAL_TABLET | Freq: Four times a day (QID) | ORAL | Status: DC | PRN

## 2017-07-27 MED ORDER — SENNOSIDES-DOCUSATE SODIUM 8.6-50 MG PO TABS: 1.0000 | ORAL_TABLET | Freq: Every evening | ORAL | Status: DC | PRN

## 2017-07-27 MED ORDER — OXYCODONE-ACETAMINOPHEN 5-325 MG PO TABS
1.0000 | ORAL_TABLET | Freq: Once | ORAL | Status: AC
  Administered 2017-07-27: 1 via ORAL
  Filled 2017-07-27: qty 1

## 2017-07-27 NOTE — H&P (Addendum)
[place] [facility] [place] NAME: Patricia Maldonado    MR#:  [id number]  DATE OF BIRTH:  21-Aug-1981  DATE OF ADMISSION:  07/27/2017  PRIMARY CARE PHYSICIAN: Letta Median, MD   REQUESTING/REFERRING PHYSICIAN: dr Burlene Arnt  CHIEF COMPLAINT:   Nausea and  vomiting HISTORY OF PRESENT ILLNESS:  Patricia Maldonado  is a 36 y.o. female with a known history of lupus nephritis who presents to the ED complaining of 2 days of nausea, vomiting and diarrhea. She denies any sick contacts patient denies fever or chills. She denies melena or bright red blood per rectum. She denies hemoptysis. She denies abdominal pain. She denies any flank pain.   PAST MEDICAL HISTORY:   Past Medical History:  Diagnosis Date  . Acute renal failure (ARF) ([place])   . Anemia   . History of kidney stones   . Hypertension    OFF BP MEDS X 3 WEEKS DUE TO BP CONTROL  . 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 Rash   . Staghorn calculus   . 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[place]) 10/2016   DX DURING HOSPITAL ADMISSION    PAST SURGICAL HISTORY:   Past Surgical History:  Procedure Laterality Date  . CYSTOSCOPY/URETEROSCOPY/HOLMIUM LASER/STENT PLACEMENT Right 02/21/2017   Procedure: CYSTOSCOPY/URETEROSCOPY/HOLMIUM LASER/STENT EXCHANGE;  Surgeon: [name], MD;  Location: [facility] ORS;  Service: Urology;  Laterality: Right;  . IR NEPHROSTOMY PLACEMENT RIGHT  01/23/2017  . NEPHROLITHOTOMY Right 01/24/2017   Procedure: NEPHROLITHOTOMY PERCUTANEOUS;  Surgeon: [name], MD;  Location: [facility] ORS;  Service: Urology;  Laterality: Right;  . TUBAL LIGATION      SOCIAL HISTORY:   Social History  Substance Use Topics  . Smoking status: Current Every Day Smoker    Packs/day: 1.00    Years: 15.00    Types: Cigarettes  . Smokeless tobacco: Never Used  . Alcohol use No    FAMILY HISTORY:   Family History  Problem Relation Age of Onset  . Thyroid disease Mother   . Thyroid  disease Father   . Diabetes Father     DRUG ALLERGIES:   Allergies  Allergen Reactions  . Penicillins Swelling    Yeast infection Has patient had a PCN reaction causing immediate rash, facial/tongue/throat swelling, SOB or lightheadedness with hypotension:No Has patient had a PCN reaction causing severe rash involving mucus membranes or skin necrosis: Yes Has patient had a PCN reaction that required hospitalization No Has patient had a PCN reaction occurring within the last 10 years: No If all of the above answers are "NO", then may proceed with Cephalosporin use.     REVIEW OF SYSTEMS:   Review of Systems  Constitutional: Negative.  Negative for chills, fever and malaise/fatigue.  HENT: Negative.  Negative for ear discharge, ear pain, hearing loss, nosebleeds and sore throat.   Eyes: Negative.  Negative for blurred vision and pain.  Respiratory: Negative.  Negative for cough, hemoptysis, shortness of breath and wheezing.   Cardiovascular: Negative.  Negative for chest pain, palpitations and leg swelling.  Gastrointestinal: Positive for diarrhea, nausea and vomiting. Negative for abdominal pain and blood in stool.  Genitourinary: Negative.  Negative for dysuria.  Musculoskeletal: Negative.  Negative for back pain.  Skin: Negative.   Neurological: Negative for dizziness, tremors, speech change, focal weakness, seizures and headaches.  Endo/Heme/Allergies: Negative.  Does not bruise/bleed easily.  Psychiatric/Behavioral: Negative.  Negative for depression, hallucinations and suicidal ideas.    MEDICATIONS AT HOME:  Prior to Admission medications   Medication Sig Start Date End Date Taking? Authorizing Provider  amLODipine (NORVASC) 5 MG tablet Take 5 mg by mouth daily.   Yes [provider]  hydroxychloroquine (PLAQUENIL) 200 MG tablet Take 1 tablet (200 mg total) by mouth daily. 10/12/16  Yes Max Sane, MD  predniSONE (DELTASONE) 10 MG tablet TAKE 1 TABLET BY MOUTH  ONCE A DAY IN THE MORNING 12/02/16  Yes [provider]  acetaminophen (TYLENOL) 500 MG tablet Take 1-2 tablets (500-1,000 mg total) by mouth every 6 (six) hours as needed for mild pain. Patient not taking: Reported on 06/26/2017 10/12/16   Max Sane, MD  docusate sodium (COLACE) 100 MG capsule Take 1 capsule (100 mg total) by mouth 2 (two) times daily. Patient not taking: Reported on 06/26/2017 02/21/17   [name], MD  HYDROcodone-acetaminophen (NORCO/VICODIN) 5-325 MG tablet Take 1-2 tablets by mouth every 6 (six) hours as needed for moderate pain. Patient not taking: Reported on 06/26/2017 03/03/17   Nicholes Mango, MD  oxybutynin (DITROPAN) 5 MG tablet Take 1 tablet (5 mg total) by mouth every 8 (eight) hours as needed for bladder spasms. Patient not taking: Reported on 07/27/2017 02/21/17   [name], MD  sulfamethoxazole-trimethoprim (BACTRIM DS,SEPTRA DS) 800-160 MG tablet Take 1 tablet by mouth every 12 (twelve) hours. Patient not taking: Reported on 06/26/2017 01/20/17   [name], MD  tamsulosin (FLOMAX) 0.4 MG CAPS capsule Take 1 capsule (0.4 mg total) by mouth daily. Patient not taking: Reported on 06/26/2017 03/21/17   [name], MD      VITAL SIGNS:  Blood pressure (!) 136/94, pulse 70, temperature 98 F (36.7 C), temperature source Oral, resp. rate 17, weight 49.9 kg (110 lb), SpO2 100 %.  PHYSICAL EXAMINATION:   Physical Exam  Constitutional: She is oriented to person, place, and time and well-developed, well-nourished, and in no distress. No distress.  HENT:  Head: Normocephalic.  Eyes: No scleral icterus.  Neck: Normal range of motion. Neck supple. No JVD present. No tracheal deviation present.  Cardiovascular: Normal rate, regular rhythm and normal heart sounds.  Exam reveals no gallop and no friction rub.   No murmur heard. Pulmonary/Chest: Effort normal and breath sounds normal. No respiratory distress. She has no wheezes. She has no rales. She  exhibits no tenderness.  Abdominal: Soft. Bowel sounds are normal. She exhibits no distension and no mass. There is no tenderness. There is no rebound and no guarding.  Musculoskeletal: Normal range of motion. She exhibits no edema.  Neurological: She is alert and oriented to person, place, and time.  Skin: Skin is warm. No rash noted. No erythema.  Psychiatric: Affect and judgment normal.      LABORATORY PANEL:   CBC  Recent Labs Lab 07/27/17 1208  WBC 5.6  HGB 10.5*  HCT 32.3*  PLT 193   ------------------------------------------------------------------------------------------------------------------  Chemistries   Recent Labs Lab 07/27/17 1208  NA 140  K 3.1*  CL 106  CO2 22  GLUCOSE 95  BUN 12  CREATININE 2.06*  CALCIUM 9.2  AST 20  ALT 9*  ALKPHOS 51  BILITOT 0.4   ------------------------------------------------------------------------------------------------------------------  Cardiac Enzymes No results for input(s): TROPONINI in the last 168 hours. ------------------------------------------------------------------------------------------------------------------  RADIOLOGY:  Dg Chest 2 View  Result Date: 07/27/2017 CLINICAL DATA:  Chest pain. Hypertension. Systemic lupus erythematosus. EXAM: CHEST  2 VIEW COMPARISON:  Chest radiograph Mar 01, 2017; chest CT Mar 01, 2017 FINDINGS: Lungs are clear. Heart size and pulmonary  vascularity are normal. No adenopathy. No pneumothorax. No bone lesions. IMPRESSION: No edema or consolidation. Electronically Signed   By: Lowella Grip III M.D.   On: 07/27/2017 15:19    EKG:  Normal sinus rhythm no ST elevation or depression  IMPRESSION AND PLAN:   36 year old female with history of lupus nephritis who presents with intractable nausea, vomiting and diarrhea.  1. Nausea, vomiting and diarrhea: Symptoms are consistent with gastroenteritis Continue supportive management with IV fluids and anti-emetics  2.  Acute kidney injury in the setting of problem #1 Continue IV fluids Hold nephrotoxic medications BMP for a.m.  3. . Lupus nephritis: Currently being followed by Dr. Grayland Ormond Continue Plaquenil and prednisone  4. Hypokalemia from nausea and vomiting and diarrhea Repleted recheck in a.m.  5. Tobacco dependence: Patient is encouraged to quit smoking. Counseling was provided for 4 minutes.   All the records are reviewed and case discussed with ED provider. Management plans discussed with the patient and she is in agreement  CODE STATUS: full  TOTAL TIME TAKING CARE OF THIS PATIENT: 41 minutes.    Takara Sermons M.D on 07/27/2017 at 3:54 PM  Between 7am to 6pm - Pager - [phone]  After 6pm go to [email or web address] - password [email or web address] [facility]  Office  [phone]  CC: Primary care physician; Letta Median, MD

## 2017-07-27 NOTE — ED Provider Notes (Signed)
[facility] Emergency Department Provider Note  ____________________________________________   I have reviewed the triage vital signs and the nursing notes.   HISTORY  Chief Complaint Emesis and Nausea    HPI Patricia Maldonado is a 36 y.o. female with a history of lupus, hypertension, lupus nephritis, kidney stones, and a blood clot diagnosed in May for which she took Eliquis but only for a few weeks, history of compliance issues as well, presents today with nausea vomiting and diarrhea.  She states she has had this for 2 days and not been able to keep anything down.  Patient also states that she is not taking her blood thinner because the prescription ran out and she did not know what needed to be filled.  Nor is she following up regularly with her nephrologist.  She denies any melena or bright red blood per rectum or hematemesis.  She has been vomiting for 2 days.  She states it hurts to vomit sometimes but she has no abdominal pain at this time and she feels that it was just from vomiting.  Patient states that for "a long time" she has had pain in both of her breasts and she would like me to look at her breast.  She states that she was short of breath yesterday while vomiting but no longer feels short of breath.  Patient is a very poor historian, very imprecise designations of times and dates.  She does have chronic headaches but is not having a headache at this time.  Level 5 chart caveat; no further history available due to patient being vague historian   Past Medical History:  Diagnosis Date  . Acute renal failure (ARF) ([place])   . Anemia   . History of kidney stones   . Hypertension    OFF BP MEDS X 3 WEEKS DUE TO BP CONTROL  . 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 Rash   . Staghorn calculus   . 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[place]) 10/2016   [place] ADMISSION    Patient Active Problem List   Diagnosis Date Noted  . PE (pulmonary  thromboembolism) ([place]) 03/01/2017  . Kidney stone on left side 01/23/2017  . Systemic lupus erythematosus ([place]) 12/08/2016  . Encounter for long-term (current) use of high-risk medication 12/08/2016  . 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 Acute renal failure (ARF) ([place]) 10/04/2016  . Nephrolithiasis   . Staghorn calculus   . Acute renal failure with tubular necrosis ([place]) 08/30/2016    Past Surgical History:  Procedure Laterality Date  . CYSTOSCOPY/URETEROSCOPY/HOLMIUM LASER/STENT PLACEMENT Right 02/21/2017   Procedure: CYSTOSCOPY/URETEROSCOPY/HOLMIUM LASER/STENT EXCHANGE;  Surgeon: [name], MD;  Location: [facility] ORS;  Service: Urology;  Laterality: Right;  . IR NEPHROSTOMY PLACEMENT RIGHT  01/23/2017  . NEPHROLITHOTOMY Right 01/24/2017   Procedure: NEPHROLITHOTOMY PERCUTANEOUS;  Surgeon: [name], MD;  Location: [facility] ORS;  Service: Urology;  Laterality: Right;  . TUBAL LIGATION      Prior to Admission medications   Medication Sig Start Date End Date Taking? Authorizing Provider  acetaminophen (TYLENOL) 500 MG tablet Take 1-2 tablets (500-1,000 mg total) by mouth every 6 (six) hours as needed for mild pain. Patient not taking: Reported on 06/26/2017 10/12/16   Max Sane, MD  amLODipine (NORVASC) 5 MG tablet Take 5 mg by mouth daily.    [provider]  Cyclophosphamide (CYTOXAN LYOPHILIZED IV) Inject 1 Dose into the vein every 30 (thirty) days.  [provider]  docusate sodium (COLACE) 100 MG capsule Take 1 capsule (100 mg total) by mouth 2 (two) times daily. Patient not taking: Reported on 06/26/2017 02/21/17   [name], MD  HYDROcodone-acetaminophen (NORCO/VICODIN) 5-325 MG tablet Take 1-2 tablets by mouth every 6 (six) hours as needed for moderate pain. Patient not taking: Reported on 06/26/2017 03/03/17   Nicholes Mango, MD  hydroxychloroquine (PLAQUENIL) 200 MG tablet Take 1 tablet (200 mg total) by mouth daily. Patient  taking differently: Take 200 mg by mouth every evening.  10/12/16   Max Sane, MD  oxybutynin (DITROPAN) 5 MG tablet Take 1 tablet (5 mg total) by mouth every 8 (eight) hours as needed for bladder spasms. 02/21/17   [name], MD  predniSONE (DELTASONE) 10 MG tablet TAKE 1 TABLET BY MOUTH ONCE A DAY IN THE MORNING 12/02/16   [provider]  sulfamethoxazole-trimethoprim (BACTRIM DS,SEPTRA DS) 800-160 MG tablet Take 1 tablet by mouth every 12 (twelve) hours. Patient not taking: Reported on 06/26/2017 01/20/17   [name], MD  tamsulosin (FLOMAX) 0.4 MG CAPS capsule Take 1 capsule (0.4 mg total) by mouth daily. Patient not taking: Reported on 06/26/2017 03/21/17   [name], MD    Allergies Penicillins  Family History  Problem Relation Age of Onset  . Thyroid disease Mother   . Thyroid disease Father   . Diabetes Father     Social History Social History  Substance Use Topics  . Smoking status: Current Every Day Smoker    Packs/day: 1.00    Years: 15.00    Types: Cigarettes  . Smokeless tobacco: Never Used  . Alcohol use No    Review of Systems Constitutional: No fever/chills Eyes: No visual changes. ENT: No sore throat. No stiff neck no neck pain Cardiovascular: See HPI Respiratory: Denies shortness of breath. Gastrointestinal: See HPI genitourinary: Negative for dysuria. Musculoskeletal: Negative lower extremity swelling Skin: Negative for rash. Neurological: Negative for severe headaches, focal weakness or numbness.   ____________________________________________   PHYSICAL EXAM:  VITAL SIGNS: ED Triage Vitals  Enc Vitals Group     BP 07/27/17 1208 (!) 137/98     Pulse Rate 07/27/17 1208 98     Resp 07/27/17 1208 20     Temp 07/27/17 1208 98 F (36.7 C)     Temp Source 07/27/17 1208 Oral     SpO2 07/27/17 1208 100 %     Weight 07/27/17 1209 110 lb (49.9 kg)     Height --      Head Circumference --      Peak Flow --      Pain Score  07/27/17 1208 6     Pain Loc --      Pain Edu? --      Excl. in [place]? --     Constitutional: Alert and oriented. Well appearing and in no acute distress. Eyes: Conjunctivae are normal Head: Atraumatic HEENT: No congestion/rhinnorhea. Mucous membranes are dry.  Oropharynx non-erythematous Neck:   Nontender with no meningismus, no masses, no stridor Cardiovascular: Normal rate, regular rhythm. Grossly normal heart sounds.  Good peripheral circulation. Chest: Female nurse chaperone, Caryl Pina, present, patient is some minimal tenderness to bilateral breast but no evidence of erythema swelling abscess induration mass crepitus chest or other pathology noted.  This does reproduce her chest wall pain Respiratory: Normal respiratory effort.  No retractions. Lungs CTAB. Abdominal: Soft and nontender. No distention. No guarding no rebound Back:  There is no focal tenderness or step  off.  there is no midline tenderness there are no lesions noted. there is no CVA tenderness Musculoskeletal: No lower extremity tenderness, no upper extremity tenderness. No joint effusions, no DVT signs strong distal pulses no edema Neurologic:  Normal speech and language. No gross focal neurologic deficits are appreciated.  Skin:  Skin is warm, dry and intact. No rash noted. Psychiatric: Mood and affect are normal. Speech and behavior are normal.  ____________________________________________   LABS (all labs ordered are listed, but only abnormal results are displayed)  Labs Reviewed  COMPREHENSIVE METABOLIC PANEL - Abnormal; Notable for the following:       Result Value   Potassium 3.1 (*)    Creatinine, Ser 2.06 (*)    ALT 9 (*)    GFR calc non Af Amer 30 (*)    GFR calc Af Amer 35 (*)    All other components within normal limits  CBC - Abnormal; Notable for the following:    RBC 3.73 (*)    Hemoglobin 10.5 (*)    HCT 32.3 (*)    RDW 18.6 (*)    All other components within normal limits  URINALYSIS, COMPLETE  (UACMP) WITH MICROSCOPIC - Abnormal; Notable for the following:    Color, Urine YELLOW (*)    APPearance HAZY (*)    Hgb urine dipstick SMALL (*)    Protein, ur >=300 (*)    Leukocytes, UA TRACE (*)    Squamous Epithelial / LPF 6-30 (*)    All other components within normal limits  LIPASE, BLOOD  PREGNANCY, URINE  PROTIME-INR  APTT    Pertinent labs  results that were available during my care of the patient were reviewed by me and considered in my medical decision making (see chart for details). ____________________________________________  EKG  I personally interpreted any EKGs ordered by me or triage Normal sinus rhythm 86 bpm no acute ST elevation acute ST depression normal axis ____________________________________________  RADIOLOGY  Pertinent labs & imaging results that were available during my care of the patient were reviewed by me and considered in my medical decision making (see chart for details). If possible, patient and/or family made aware of any abnormal findings. ____________________________________________    PROCEDURES  Procedure(s) performed: None  Procedures  Critical Care performed: None  ____________________________________________   INITIAL IMPRESSION / ASSESSMENT AND PLAN / ED COURSE  Pertinent labs & imaging results that were available during my care of the patient were reviewed by me and considered in my medical decision making (see chart for details).  Patient here with nausea vomiting and diarrhea as a chief complaint this is been going on for 2 days.  She has a baseline renal insufficiency but 4 months ago, her creatinine was 1.27 and today is 3.35.  Certainly some of this is gradually worsening but certainly also some of it is likely to be acute given her vomiting and diarrhea.  We will give her IV fluids.  Her abdomen is completely benign very low suspicion for intra-abdominal pathology of any significance that will require surgery or  imaging.  Considering the patient's symptoms, medical history, and physical examination today, I have low suspicion for cholecystitis or biliary pathology, pancreatitis, perforation or bowel obstruction, hernia, intra-abdominal abscess, AAA or dissection, volvulus or intussusception, mesenteric ischemia, ischemic gut, pyelonephritis or appendicitis.  Patient also complains of very reproducible chest wall pain.  This is certainly troubling given her history of pulmonary embolism and the fact that she is not taking blood thinners however,  it is very reproducible chest wall pain specifically in her breast which is been going on for a appears several months.  Is worse when I touch it, there is no obvious lesion present nothing to suggest mass abscess or infection or mastitis etc.  Very low suspicion for this particular symptom being associated with a PE although patient certainly is at risk for PE.  We cannot safely obtain a CT scan given her creatinine.  I think the patient may require admission for further evaluation of all these issues especially given her history of noncompliance.    ____________________________________________   FINAL CLINICAL IMPRESSION(S) / ED DIAGNOSES  Final diagnoses:  None      This chart was dictated using voice recognition software.  Despite best efforts to proofread,  errors can occur which can change meaning.      Schuyler Amor, MD 07/27/17 [phone]

## 2017-07-27 NOTE — ED Notes (Signed)
Pt provided ice chips upon request.

## 2017-07-27 NOTE — ED Triage Notes (Signed)
Pt reports n/v/d for two days along with chest pain.

## 2017-07-27 NOTE — ED Notes (Signed)
Patient transported to X-ray 

## 2017-07-28 LAB — BASIC METABOLIC PANEL
Anion gap: 4 — ABNORMAL LOW (ref 5–15)
BUN: 12 mg/dL (ref 6–20)
CO2: 22 mmol/L (ref 22–32)
CREATININE: 1.92 mg/dL — AB (ref 0.44–1.00)
Calcium: 8.1 mg/dL — ABNORMAL LOW (ref 8.9–10.3)
Chloride: 114 mmol/L — ABNORMAL HIGH (ref 101–111)
GFR calc Af Amer: 38 mL/min — ABNORMAL LOW (ref >60)
GFR, EST NON AFRICAN AMERICAN: 33 mL/min — AB (ref >60)
GLUCOSE: 87 mg/dL (ref 65–99)
POTASSIUM: 4.1 mmol/L (ref 3.5–5.1)
SODIUM: 140 mmol/L (ref 135–145)

## 2017-07-28 NOTE — Care Management (Addendum)
Patient admitted for nausea and vomiting. RNCM consult placed for assistance with copays.  Patient states that her PCP is [facility] at [facility] and is on a sliding scale copay.  Patient confirms that her prescriptions are $3 each with her Medicaid. Patient states "sometimes I just don't have the money to get them".  Patient does not qualify for any additional resources.  RNCM signing off.

## 2017-07-28 NOTE — Progress Notes (Signed)
[place] [facility] [place] NAME: Patricia Maldonado    MR#:  [id number]  DATE OF BIRTH:  05-06-1981  SUBJECTIVE:  CHIEF COMPLAINT:  Feeling nauseous, vomiting and diarrhea is improving  REVIEW OF SYSTEMS:  CONSTITUTIONAL: No fever, fatigue or weakness.  EYES: No blurred or double vision.  EARS, NOSE, AND THROAT: No tinnitus or ear pain.  RESPIRATORY: No cough, shortness of breath, wheezing or hemoptysis.  CARDIOVASCULAR: No chest pain, orthopnea, edema.  GASTROINTESTINAL: Still has nausea, denies vomiting, diarrhea or abdominal pain.  GENITOURINARY: No dysuria, hematuria.  ENDOCRINE: No polyuria, nocturia,  HEMATOLOGY: No anemia, easy bruising or bleeding SKIN: No rash or lesion. MUSCULOSKELETAL: No joint pain or arthritis.   NEUROLOGIC: No tingling, numbness, weakness.  PSYCHIATRY: No anxiety or depression.   DRUG ALLERGIES:   Allergies  Allergen Reactions  . Penicillins Swelling    Yeast infection Has patient had a PCN reaction causing immediate rash, facial/tongue/throat swelling, SOB or lightheadedness with hypotension:No Has patient had a PCN reaction causing severe rash involving mucus membranes or skin necrosis: Yes Has patient had a PCN reaction that required hospitalization No Has patient had a PCN reaction occurring within the last 10 years: No If all of the above answers are "NO", then may proceed with Cephalosporin use.     VITALS:  Blood pressure 123/86, pulse 69, temperature 98.1 F (36.7 C), temperature source Oral, resp. rate 16, height 5\' 2"  (1.575 m), weight 52 kg (114 lb 10.2 oz), SpO2 100 %.  PHYSICAL EXAMINATION:  GENERAL:  36 y.o.-year-old patient lying in the bed with no acute distress.  EYES: Pupils equal, round, reactive to light and accommodation. No scleral icterus. Extraocular muscles intact.  HEENT: Head atraumatic, normocephalic. Oropharynx and nasopharynx clear.  NECK:  Supple, no jugular venous  distention. No thyroid enlargement, no tenderness.  LUNGS: Normal breath sounds bilaterally, no wheezing, rales,rhonchi or crepitation. No use of accessory muscles of respiration.  CARDIOVASCULAR: S1, S2 normal. No murmurs, rubs, or gallops.  ABDOMEN: Soft, nontender, nondistended. Bowel sounds present. No organomegaly or mass.  EXTREMITIES: No pedal edema, cyanosis, or clubbing.  NEUROLOGIC: Cranial nerves II through XII are intact. Muscle strength 5/5 in all extremities. Sensation intact. Gait not checked.  PSYCHIATRIC: The patient is alert and oriented x 3.  SKIN: No obvious rash, lesion, or ulcer.    LABORATORY PANEL:   CBC  Recent Labs Lab 07/27/17 1208  WBC 5.6  HGB 10.5*  HCT 32.3*  PLT 193   ------------------------------------------------------------------------------------------------------------------  Chemistries   Recent Labs Lab 07/27/17 1208 07/28/17 0351  NA 140 140  K 3.1* 4.1  CL 106 114*  CO2 22 22  GLUCOSE 95 87  BUN 12 12  CREATININE 2.06* 1.92*  CALCIUM 9.2 8.1*  AST 20  --   ALT 9*  --   ALKPHOS 51  --   BILITOT 0.4  --    ------------------------------------------------------------------------------------------------------------------  Cardiac Enzymes No results for input(s): TROPONINI in the last 168 hours. ------------------------------------------------------------------------------------------------------------------  RADIOLOGY:  Dg Chest 2 View  Result Date: 07/27/2017 CLINICAL DATA:  Chest pain. Hypertension. Systemic lupus erythematosus. EXAM: CHEST  2 VIEW COMPARISON:  Chest radiograph Mar 01, 2017; chest CT Mar 01, 2017 FINDINGS: Lungs are clear. Heart size and pulmonary vascularity are normal. No adenopathy. No pneumothorax. No bone lesions. IMPRESSION: No edema or consolidation. Electronically Signed   By: Lowella Grip III M.D.   On: 07/27/2017 15:19    EKG:   Orders  placed or performed during the hospital encounter of  07/27/17  . EKG 12-Lead  . EKG 12-Lead    ASSESSMENT AND PLAN:     36 year old female with history of lupus nephritis who presents with intractable nausea, vomiting and diarrhea.  1. Nausea, vomiting and diarrhea: Symptoms are consistent with gastroenteritis Continue supportive management with IV fluids and anti-emetics Will start clear liquids and advance as tolerated  2. Acute kidney injury in the setting of problem #1 Continue IV fluids Hold nephrotoxic medications Creatinine 2.06-1.9  BMP for a.m.  3. . Lupus nephritis: Currently being followed by Dr. Grayland Ormond Continue Plaquenil and prednisone  4. Hypokalemia from nausea and vomiting and diarrhea Repleted , potassium at 4.1 today  5. Tobacco dependence: Patient is encouraged to quit smoking. Counseling was provided for 4 minutes.   All the records are reviewed and case discussed with Care Management/Social Workerr. Management plans discussed with the patient, family and they are in agreement.  CODE STATUS: FC   TOTAL TIME TAKING CARE OF THIS PATIENT: 35  minutes.   POSSIBLE D/C IN 2  DAYS, DEPENDING ON CLINICAL CONDITION.  Note: This dictation was prepared with Dragon dictation along with smaller phrase technology. Any transcriptional errors that result from this process are unintentional.   Nicholes Mango M.D on 07/28/2017 at 4:13 PM  Between 7am to 6pm - Pager - [phone] After 6pm go to [email or web address] - password [email or web address] [facility]  Office  [phone]  CC: Primary care physician; Letta Median, MD

## 2017-07-29 LAB — BASIC METABOLIC PANEL
Anion gap: 3 — ABNORMAL LOW (ref 5–15)
BUN: 12 mg/dL (ref 6–20)
CALCIUM: 8.5 mg/dL — AB (ref 8.9–10.3)
CO2: 21 mmol/L — AB (ref 22–32)
CREATININE: 1.93 mg/dL — AB (ref 0.44–1.00)
Chloride: 115 mmol/L — ABNORMAL HIGH (ref 101–111)
GFR, EST AFRICAN AMERICAN: 38 mL/min — AB (ref >60)
GFR, EST NON AFRICAN AMERICAN: 33 mL/min — AB (ref >60)
GLUCOSE: 93 mg/dL (ref 65–99)
Potassium: 4.2 mmol/L (ref 3.5–5.1)
Sodium: 139 mmol/L (ref 135–145)

## 2017-07-29 LAB — CBC
HEMATOCRIT: 26.4 % — AB (ref 35.0–47.0)
Hemoglobin: 8.8 g/dL — ABNORMAL LOW (ref 12.0–16.0)
MCH: 28.9 pg (ref 26.0–34.0)
MCHC: 33.2 g/dL (ref 32.0–36.0)
MCV: 87 fL (ref 80.0–100.0)
PLATELETS: 157 10*3/uL (ref 150–440)
RBC: 3.04 MIL/uL — ABNORMAL LOW (ref 3.80–5.20)
RDW: 17.9 % — AB (ref 11.5–14.5)
WBC: 3.8 10*3/uL (ref 3.6–11.0)

## 2017-07-29 MED ORDER — BUTALBITAL-APAP-CAFFEINE 50-325-40 MG PO TABS
1.0000 | ORAL_TABLET | Freq: Four times a day (QID) | ORAL | Status: DC | PRN
  Filled 2017-07-29: qty 1

## 2017-07-29 MED ORDER — MAGNESIUM SULFATE 2 GM/50ML IV SOLN
2.0000 g | Freq: Once | INTRAVENOUS | Status: AC
  Administered 2017-07-29: 2 g via INTRAVENOUS
  Filled 2017-07-29: qty 50

## 2017-07-29 MED ORDER — SODIUM CHLORIDE 0.9 % IV SOLN
INTRAVENOUS | Status: DC
  Administered 2017-07-29 – 2017-07-30 (×2): via INTRAVENOUS

## 2017-07-29 NOTE — Progress Notes (Signed)
Patient ID: Patricia Maldonado, female   DOB: [birth year], 36 y.o.   MRN: [id number]  [facility] PROGRESS NOTE  Patricia Maldonado JSH:[id number] DOB: Dec 15, 1980 DOA: 07/27/2017 PCP: Letta Median, MD  HPI/Subjective: Patient complains of headache.  Had one episode of diarrhea last night but this has stopped.  Some nausea.  Vomiting has resolved.  Looking to advance diet.  Objective: Vitals:   07/29/17 0459 07/29/17 1323  BP: 112/74 103/70  Pulse: 85 (!) 103  Resp: 16   Temp: 98.2 F (36.8 C) 98.2 F (36.8 C)  SpO2: 99% 99%    Filed Weights   07/27/17 1209 07/27/17 1659 07/29/17 0500  Weight: 49.9 kg (110 lb) 52 kg (114 lb 10.2 oz) 49.9 kg (109 lb 14.4 oz)    ROS: Review of Systems  Constitutional: Negative for chills and fever.  Eyes: Negative for blurred vision.  Respiratory: Negative for cough and shortness of breath.   Cardiovascular: Negative for chest pain.  Gastrointestinal: Positive for nausea. Negative for abdominal pain, constipation, diarrhea and vomiting.  Genitourinary: Negative for dysuria.  Musculoskeletal: Negative for joint pain.  Neurological: Positive for headaches. Negative for dizziness.   Exam: Physical Exam  Constitutional: She is oriented to person, place, and time.  HENT:  Nose: No mucosal edema.  Mouth/Throat: No oropharyngeal exudate or posterior oropharyngeal edema.  Eyes: Pupils are equal, round, and reactive to light. Conjunctivae, EOM and lids are normal.  Neck: No JVD present. Carotid bruit is not present. No edema present. No thyroid mass and no thyromegaly present.  Cardiovascular: S1 normal and S2 normal.  Exam reveals no gallop.   No murmur heard. Pulses:      Dorsalis pedis pulses are 2+ on the right side, and 2+ on the left side.  Respiratory: No respiratory distress. She has no wheezes. She has no rhonchi. She has no rales.  GI: Soft. Bowel sounds are normal. There is no tenderness.  Musculoskeletal:       Right ankle: She  exhibits no swelling.       Left ankle: She exhibits no swelling.  Lymphadenopathy:    She has no cervical adenopathy.  Neurological: She is alert and oriented to person, place, and time. No cranial nerve deficit.  Skin: Skin is warm. No rash noted. Nails show no clubbing.  Psychiatric: She has a normal mood and affect.      Data Reviewed: Basic Metabolic Panel:  Recent Labs Lab 07/27/17 1208 07/28/17 0351 07/29/17 0411  NA 140 140 139  K 3.1* 4.1 4.2  CL 106 114* 115*  CO2 22 22 21*  GLUCOSE 95 87 93  BUN 12 12 12   CREATININE 2.06* 1.92* 1.93*  CALCIUM 9.2 8.1* 8.5*   Liver Function Tests:  Recent Labs Lab 07/27/17 1208  AST 20  ALT 9*  ALKPHOS 51  BILITOT 0.4  PROT 7.6  ALBUMIN 3.6    Recent Labs Lab 07/27/17 1208  LIPASE 18   CBC:  Recent Labs Lab 07/27/17 1208 07/29/17 0411  WBC 5.6 3.8  HGB 10.5* 8.8*  HCT 32.3* 26.4*  MCV 86.6 87.0  PLT 193 157     Studies: Dg Chest 2 View  Result Date: 07/27/2017 CLINICAL DATA:  Chest pain. Hypertension. Systemic lupus erythematosus. EXAM: CHEST  2 VIEW COMPARISON:  Chest radiograph Mar 01, 2017; chest CT Mar 01, 2017 FINDINGS: Lungs are clear. Heart size and pulmonary vascularity are normal. No adenopathy. No pneumothorax. No bone lesions. IMPRESSION: No edema or consolidation.  Electronically Signed   By: Lowella Grip III M.D.   On: 07/27/2017 15:19    Scheduled Meds: . enoxaparin (LOVENOX) injection  40 mg Subcutaneous Q24H  . hydroxychloroquine  200 mg Oral Daily  . predniSONE  10 mg Oral Q breakfast   Continuous Infusions: . sodium chloride 50 mL/hr at 07/29/17 0958    Assessment/Plan:  1. Acute kidney injury on chronic kidney disease stage III.  Decrease rate of IV fluids.  I would have like to see creatinine improved more today than it did. 2. Nausea vomiting and diarrhea.  Likely gastroenteritis.  Unfortunately no stool studies sent off on admission.  Advance diet to solid  food. 3. Systemic lupus.  On Plaquenil and prednisone 4. Headache.  Give IV magnesium 5. Previous renal calculi 6. Anemia of chronic disease.  Decrease rate of IV fluid hydration  Code Status:     Code Status Orders        Start     Ordered   07/27/17 1658  Full code  Continuous     07/27/17 1657    Code Status History    Date Active Date Inactive Code Status Order ID Comments User Context   03/01/2017  1:56 PM 03/03/2017  7:17 PM Full Code [id number]  Max Sane, MD Inpatient   01/23/2017 12:00 PM 01/25/2017  9:51 PM Full Code [id number]  [name], MD Inpatient   10/04/2016  9:50 PM 10/12/2016  3:04 PM Full Code [id number]  Henreitta Leber, MD Inpatient   08/30/2016  7:13 PM 09/04/2016  2:43 PM Full Code [id number]  Hower, Aaron Mose, MD ED      Disposition Plan: Hopefully home soon  Time spent: 26 minutes  [place], [facility]

## 2017-07-29 NOTE — Progress Notes (Signed)
Patient ate 100% of lunch tray including a cheeseburger. No complaints of nausea, vomiting or diarrhea. Patient stated she feels good.

## 2017-07-30 LAB — BASIC METABOLIC PANEL
Anion gap: 4 — ABNORMAL LOW (ref 5–15)
BUN: 21 mg/dL — ABNORMAL HIGH (ref 6–20)
CHLORIDE: 110 mmol/L (ref 101–111)
CO2: 21 mmol/L — ABNORMAL LOW (ref 22–32)
CREATININE: 2.2 mg/dL — AB (ref 0.44–1.00)
Calcium: 8.2 mg/dL — ABNORMAL LOW (ref 8.9–10.3)
GFR, EST AFRICAN AMERICAN: 32 mL/min — AB (ref >60)
GFR, EST NON AFRICAN AMERICAN: 28 mL/min — AB (ref >60)
Glucose, Bld: 96 mg/dL (ref 65–99)
POTASSIUM: 3.9 mmol/L (ref 3.5–5.1)
SODIUM: 135 mmol/L (ref 135–145)

## 2017-07-30 MED ORDER — HYDROXYCHLOROQUINE SULFATE 200 MG PO TABS: 200.0000 mg | ORAL_TABLET | Freq: Every day | ORAL | 0 refills | Status: DC

## 2017-07-30 MED ORDER — PREDNISONE 10 MG PO TABS: 10.0000 mg | ORAL_TABLET | Freq: Every day | ORAL | 0 refills | Status: DC

## 2017-07-30 MED ORDER — ASPIRIN 325 MG PO TABS: 325.0000 mg | ORAL_TABLET | Freq: Every day | ORAL | 0 refills | Status: DC | PRN

## 2017-07-30 MED ORDER — ENOXAPARIN SODIUM 30 MG/0.3ML ~~LOC~~ SOLN: 30.0000 mg | SUBCUTANEOUS | Status: DC

## 2017-07-30 MED ORDER — ASPIRIN 325 MG PO TABS
325.0000 mg | ORAL_TABLET | Freq: Once | ORAL | Status: DC
  Filled 2017-07-30: qty 1

## 2017-07-30 NOTE — Progress Notes (Signed)
Order for enoxaparin 40 mg subQ daily was changed to enoxaparin 30 mg daily per protocol for CrCl < 30 mL/min.  Lenis Noon, PharmD 07/30/17 1:16 PM

## 2017-07-30 NOTE — Progress Notes (Signed)
MD order to discharge. Discharge instructions, prescriptions and instructions for follow up appointment. Questions were encouraged. Patient voiced understanding. Will call for wheelchair when her transportation is present.

## 2017-07-30 NOTE — Discharge Summary (Signed)
Patricia Maldonado at [facility] NAME: Patricia Maldonado    MR#:  [id number]  DATE OF BIRTH:  September 06, 1981  DATE OF ADMISSION:  07/27/2017 ADMITTING PHYSICIAN: Bettey Costa, MD  DATE OF DISCHARGE: 07/30/2017  PRIMARY CARE PHYSICIAN: Letta Median, MD    ADMISSION DIAGNOSIS:  AKI (acute kidney injury) ([place]) [N17.9]  DISCHARGE DIAGNOSIS:  Active Problems:   Acute kidney injury ([place])   SECONDARY DIAGNOSIS:   Past Medical History:  Diagnosis Date  . Acute renal failure (ARF) ([place])   . Anemia   . History of kidney stones   . Hypertension    OFF BP MEDS X 3 WEEKS DUE TO BP CONTROL  . 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 Rash   . Staghorn calculus   . 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  Nausea, vomiting and diarrhea.  This is likely a gastroenteritis of viral cause.  This has resolved and she is tolerating solid food.  Unfortunately no stool studies sent off on admission. 2.  Acute kidney injury on chronic kidney disease stage III.  Creatinine actually stayed about the same during the entire hospital course ranging between 1.9 and 2.2.  Case discussed with Dr. Candiss Norse nephrology.  Her baseline creatinine as outpatient was 1.4.  Since the patient is tolerating diet and feeling okay this can be followed up as outpatient.  Dr. Candiss Norse recommended a follow-up blood test and blood pressure check this coming week and Dr. Elwyn Lade office.  The patient has a follow-up on 14 November with Dr. Holley Raring.  The patient was advised to stop taking ibuprofen or Advil.  The patient's blood pressure was on the lower side during the hospital course and her Norvasc was stopped. 3.  SLE.  The patient is on Plaquenil and prednisone.  I had to refill both of the prescription so I am wondering if she ran out as outpatient. 4.  History of staghorn calculus follows up with Dr. Erlene Quan as outpatient.  She did  have 2 procedures as outpatient. 5.  Essential hypertension.  Stop Norvasc at this time blood pressure is normal range.  Follow-up as outpatient. 6.  Headache.  Can take aspirin as needed for headache or Tylenol.  Do not take Advil Motrin Aleve or BC powder  DISCHARGE CONDITIONS:   Satisfactory  CONSULTS OBTAINED:  Phone consult with nephrology  DRUG ALLERGIES:   Allergies  Allergen Reactions  . Penicillins Swelling    Yeast infection Has patient had a PCN reaction causing immediate rash, facial/tongue/throat swelling, SOB or lightheadedness with hypotension:No Has patient had a PCN reaction causing severe rash involving mucus membranes or skin necrosis: Yes Has patient had a PCN reaction that required hospitalization No Has patient had a PCN reaction occurring within the last 10 years: No If all of the above answers are "NO", then may proceed with Cephalosporin use.     DISCHARGE MEDICATIONS:   Current Discharge Medication List    START taking these medications   Details  aspirin 325 MG tablet Take 1 tablet (325 mg total) by mouth daily as needed for headache. Qty: 30 tablet, Refills: 0      CONTINUE these medications which have CHANGED   Details  hydroxychloroquine (PLAQUENIL) 200 MG tablet Take 1 tablet (200 mg total) by mouth daily. Qty: 30 tablet, Refills: 0    predniSONE (DELTASONE) 10 MG tablet Take 1  tablet (10 mg total) by mouth daily with breakfast. Qty: 30 tablet, Refills: 0      CONTINUE these medications which have NOT CHANGED   Details  acetaminophen (TYLENOL) 500 MG tablet Take 1-2 tablets (500-1,000 mg total) by mouth every 6 (six) hours as needed for mild pain. Qty: 30 tablet, Refills: 0      STOP taking these medications     amLODipine (NORVASC) 5 MG tablet      docusate sodium (COLACE) 100 MG capsule      HYDROcodone-acetaminophen (NORCO/VICODIN) 5-325 MG tablet      oxybutynin (DITROPAN) 5 MG tablet      sulfamethoxazole-trimethoprim  (BACTRIM DS,SEPTRA DS) 800-160 MG tablet      tamsulosin (FLOMAX) 0.4 MG CAPS capsule          DISCHARGE INSTRUCTIONS:   Follow-up PMD 1 week Follow-up nephrology on November 14  If you experience worsening of your admission symptoms, develop shortness of breath, life threatening emergency, suicidal or homicidal thoughts you must seek medical attention immediately by calling 911 or calling your MD immediately  if symptoms less severe.  You Must read complete instructions/literature along with all the possible adverse reactions/side effects for all the Medicines you take and that have been prescribed to you. Take any new Medicines after you have completely understood and accept all the possible adverse reactions/side effects.   Please note  You were cared for by a hospitalist during your hospital stay. If you have any questions about your discharge medications or the care you received while you were in the hospital after you are discharged, you can call the unit and asked to speak with the hospitalist on call if the hospitalist that took care of you is not available. Once you are discharged, your primary care physician will handle any further medical issues. Please note that NO REFILLS for any discharge medications will be authorized once you are discharged, as it is imperative that you return to your primary care physician (or establish a relationship with a primary care physician if you do not have one) for your aftercare needs so that they can reassess your need for medications and monitor your lab values.    Today   CHIEF COMPLAINT:   Chief Complaint  Patient presents with  . Emesis  . Nausea    HISTORY OF PRESENT ILLNESS:  Patricia Maldonado  is a 36 y.o. female with a known history of lupus presents with nausea vomiting and diarrhea   VITAL SIGNS:  Blood pressure 120/79, pulse 73, temperature 98.1 F (36.7 C), temperature source Oral, resp. rate 20, height 5\' 2"  (1.575 m),  weight 51.2 kg (112 lb 14.4 oz), SpO2 99 %.    PHYSICAL EXAMINATION:  GENERAL:  36 y.o.-year-old patient lying in the bed with no acute distress.  EYES: Pupils equal, round, reactive to light and accommodation. No scleral icterus. Extraocular muscles intact.  HEENT: Head atraumatic, normocephalic. Oropharynx and nasopharynx clear.  NECK:  Supple, no jugular venous distention. No thyroid enlargement, no tenderness.  LUNGS: Normal breath sounds bilaterally, no wheezing, rales,rhonchi or crepitation. No use of accessory muscles of respiration.  CARDIOVASCULAR: S1, S2 normal. No murmurs, rubs, or gallops.  ABDOMEN: Soft, non-tender, non-distended. Bowel sounds present. No organomegaly or mass.  EXTREMITIES: No pedal edema, cyanosis, or clubbing.  NEUROLOGIC: Cranial nerves II through XII are intact. Muscle strength 5/5 in all extremities. Sensation intact. Gait not checked.  PSYCHIATRIC: The patient is alert and oriented x 3.  SKIN: No  obvious rash, lesion, or ulcer.   DATA REVIEW:   CBC  Recent Labs Lab 07/29/17 0411  WBC 3.8  HGB 8.8*  HCT 26.4*  PLT 157    Chemistries   Recent Labs Lab 07/27/17 1208  07/30/17 0337  NA 140  < > 135  K 3.1*  < > 3.9  CL 106  < > 110  CO2 22  < > 21*  GLUCOSE 95  < > 96  BUN 12  < > 21*  CREATININE 2.06*  < > 2.20*  CALCIUM 9.2  < > 8.2*  AST 20  --   --   ALT 9*  --   --   ALKPHOS 51  --   --   BILITOT 0.4  --   --   < > = values in this interval not displayed.   Microbiology Results  Results for orders placed or performed during the hospital encounter of 03/01/17  Blood culture (routine x 2)     Status: None   Collection Time: 03/01/17 12:38 PM  Result Value Ref Range Status   Specimen Description BLOOD  LEFT FOREARM   Final   Special Requests   Final    BOTTLES DRAWN AEROBIC AND ANAEROBIC Blood Culture results may not be optimal due to an excessive volume of blood received in culture bottles   Culture NO GROWTH 5 DAYS  Final    Report Status 03/06/2017 FINAL  Final  Blood culture (routine x 2)     Status: None   Collection Time: 03/01/17 12:38 PM  Result Value Ref Range Status   Specimen Description BLOOD  RIGHT AC   Final   Special Requests   Final    BOTTLES DRAWN AEROBIC AND ANAEROBIC Blood Culture adequate volume   Culture NO GROWTH 5 DAYS  Final   Report Status 03/06/2017 FINAL  Final      Management plans discussed with the patient, family and they are in agreement.  CODE STATUS:     Code Status Orders        Start     Ordered   07/27/17 1658  Full code  Continuous     07/27/17 1657    Code Status History    Date Active Date Inactive Code Status Order ID Comments User Context   03/01/2017  1:56 PM 03/03/2017  7:17 PM Full Code [id number]  Max Sane, MD Inpatient   01/23/2017 12:00 PM 01/25/2017  9:51 PM Full Code [id number]  [name], MD Inpatient   10/04/2016  9:50 PM 10/12/2016  3:04 PM Full Code [id number]  Henreitta Leber, MD Inpatient   08/30/2016  7:13 PM 09/04/2016  2:43 PM Full Code [id number]  Hower, Aaron Mose, MD ED      TOTAL TIME TAKING CARE OF THIS PATIENT: 32 minutes.    Loletha Grayer M.D on 07/30/2017 at 2:32 PM  Between 7am to 6pm - Pager - [phone]  After 6pm go to [email or web address] - password [email or web address]  [facility] Office  [phone]  CC: Primary care physician; Letta Median, MD

## 2017-07-30 NOTE — Discharge Instructions (Addendum)
Do not take, advil, motrin, alleve, ibuprofen or bc powder.  If you have a headache, you can take tylenol or aspirin   Chronic Kidney Disease, Adult Chronic kidney disease (CKD) happens when the kidneys are damaged during a time of 3 or more months. The kidneys are two organs that do many important jobs in the body. These jobs include:  Removing wastes and extra fluids from the blood.  Making hormones that maintain the amount of fluid in your tissues and blood vessels.  Making sure that the body has the right amount of fluids and chemicals.  Most of the time, this condition does not go away, but it can usually be controlled. Steps must be taken to slow down the kidney damage or stop it from getting worse. Otherwise, the kidneys may stop working. Follow these instructions at home:  Follow your diet as told by your doctor. You may need to avoid alcohol, salty foods (sodium), and foods that are high in potassium, calcium, and protein.  Take over-the-counter and prescription medicines only as told by your doctor. Do not take any new medicines unless your doctor says you can do that. These include vitamins and minerals. ? Medicines and nutritional supplements can make kidney damage worse. ? Your doctor may need to change how much medicine you take.  Do not use any tobacco products. These include cigarettes, chewing tobacco, and e-cigarettes. If you need help quitting, ask your doctor.  Keep all follow-up visits as told by your doctor. This is important.  Check your blood pressure. Tell your doctor if there are changes to your blood pressure.  Get to a healthy weight. Stay at that weight. If you need help with this, ask your doctor.  Start or continue an exercise plan. Try to exercise at least 30 minutes a day, 5 days a week.  Stay up-to-date with your shots (immunizations) as told by your doctor. Contact a doctor if:  Your symptoms get worse.  You have new symptoms. Get help right  away if:  You have symptoms of end-stage kidney disease. These include: ? Headaches. ? Skin that is darker or lighter than normal. ? Numbness in your hands or feet. ? Easy bruising. ? Having hiccups often. ? Chest pain. ? Shortness of breath. ? Stopping of menstrual periods in women.  You have a fever.  You are making very little pee (urine).  You have pain or bleeding when you pee (urinate). This information is not intended to replace advice given to you by your health care provider. Make sure you discuss any questions you have with your health care provider. Document Released: 12/14/2009 Document Revised: 02/25/2016 Document Reviewed: 05/18/2012 Elsevier Interactive Patient Education  2017 [employer].

## 2017-09-18 ENCOUNTER — Other Ambulatory Visit
Admission: RE | Admit: 2017-09-18 | Discharge: 2017-09-18 | Disposition: A | Discharge status: Home / Self Care | Payer: Disability Insurance | Source: Ambulatory Visit | Attending: Pediatrics | Admitting: Pediatrics

## 2017-09-18 DIAGNOSIS — M3214 Glomerular disease in systemic lupus erythematosus: Secondary | ICD-10-CM | POA: Diagnosis present

## 2017-09-18 DIAGNOSIS — N289 Disorder of kidney and ureter, unspecified: Secondary | ICD-10-CM | POA: Insufficient documentation

## 2017-09-18 LAB — ALBUMIN: ALBUMIN: 3.3 g/dL — AB (ref 3.5–5.0)

## 2017-09-18 LAB — HEMATOCRIT: HCT: 31.5 % — ABNORMAL LOW (ref 35.0–47.0)

## 2017-09-18 LAB — CREATININE, SERUM
Creatinine, Ser: 2.19 mg/dL — ABNORMAL HIGH (ref 0.44–1.00)
GFR calc Af Amer: 33 mL/min — ABNORMAL LOW (ref >60)
GFR, EST NON AFRICAN AMERICAN: 28 mL/min — AB (ref >60)

## 2017-09-18 LAB — HEMOGLOBIN: HEMOGLOBIN: 10.4 g/dL — AB (ref 12.0–16.0)

## 2017-09-18 LAB — BUN: BUN: 21 mg/dL — AB (ref 6–20)

## 2017-11-13 ENCOUNTER — Encounter: Payer: Self-pay | Admitting: Emergency Medicine

## 2017-11-13 ENCOUNTER — Other Ambulatory Visit: Payer: Self-pay

## 2017-11-13 ENCOUNTER — Emergency Department
Admission: EM | Admit: 2017-11-13 | Discharge: 2017-11-13 | Disposition: A | Discharge status: Home / Self Care | Payer: Medicaid Other | Attending: Emergency Medicine | Admitting: Emergency Medicine

## 2017-11-13 DIAGNOSIS — Z79899 Other long term (current) drug therapy: Secondary | ICD-10-CM | POA: Diagnosis not present

## 2017-11-13 DIAGNOSIS — R05 Cough: Secondary | ICD-10-CM | POA: Diagnosis present

## 2017-11-13 DIAGNOSIS — F17210 Nicotine dependence, cigarettes, uncomplicated: Secondary | ICD-10-CM | POA: Diagnosis not present

## 2017-11-13 DIAGNOSIS — I10 Essential (primary) hypertension: Secondary | ICD-10-CM | POA: Insufficient documentation

## 2017-11-13 DIAGNOSIS — J111 Influenza due to unidentified influenza virus with other respiratory manifestations: Secondary | ICD-10-CM

## 2017-11-13 LAB — INFLUENZA PANEL BY PCR (TYPE A & B)
Influenza A By PCR: POSITIVE — AB
Influenza B By PCR: NEGATIVE

## 2017-11-13 MED ORDER — OSELTAMIVIR PHOSPHATE 75 MG PO CAPS
75.0000 mg | ORAL_CAPSULE | Freq: Once | ORAL | Status: AC
  Administered 2017-11-13: 75 mg via ORAL
  Filled 2017-11-13: qty 1

## 2017-11-13 MED ORDER — GUAIFENESIN-CODEINE 100-10 MG/5ML PO SYRP: 5.0000 mL | ORAL_SOLUTION | Freq: Three times a day (TID) | ORAL | 0 refills | Status: DC | PRN

## 2017-11-13 MED ORDER — ASPIRIN EC 325 MG PO TBEC
325.0000 mg | DELAYED_RELEASE_TABLET | Freq: Once | ORAL | Status: AC
  Administered 2017-11-13: 325 mg via ORAL
  Filled 2017-11-13: qty 1

## 2017-11-13 MED ORDER — OSELTAMIVIR PHOSPHATE 75 MG PO CAPS: 75.0000 mg | ORAL_CAPSULE | Freq: Two times a day (BID) | ORAL | 0 refills | Status: AC

## 2017-11-13 MED ORDER — HYDROCOD POLST-CPM POLST ER 10-8 MG/5ML PO SUER
5.0000 mL | Freq: Once | ORAL | Status: AC
  Administered 2017-11-13: 5 mL via ORAL
  Filled 2017-11-13: qty 5

## 2017-11-13 NOTE — ED Triage Notes (Signed)
genralized body aches and fever since yesterday. Congested.

## 2017-11-13 NOTE — ED Provider Notes (Signed)
[facility] Emergency Department Provider Note  ____________________________________________  Time seen: Approximately 7:52 PM  I have reviewed the triage vital signs and the nursing notes.   HISTORY  Chief Complaint Generalized Body Aches and Fever   HPI Patricia Maldonado is a 37 y.o. female who presents to the ER for evaluation of cough and subjective fever that started about a 24 hours ago. She also complains of nausea and vomiting with headache and bodyaches. She has neck pain and increase in headache with the cough. She vomited after taking Alka Seltzer. She is able to tolerate water without vomiting.  Patient has a significant past medical history of lupus.  She states that she has been advised to take aspirin instead of other NSAIDs or Tylenol for pain or fever.  She has not taken any medications today at all.   Past Medical History:  Diagnosis Date  . Acute renal failure (ARF) ([place])   . Anemia   . History of kidney stones   . Hypertension    OFF BP MEDS X 3 WEEKS DUE TO BP CONTROL  . 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 Rash   . Staghorn calculus   . 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 Acute kidney injury ([place]) 07/27/2017  . PE (pulmonary thromboembolism) ([place]) 03/01/2017  . Kidney stone on left side 01/23/2017  . Systemic lupus erythematosus ([place]) 12/08/2016  . Encounter for long-term (current) use of high-risk medication 12/08/2016  . 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 Lupus nephritis ([place]) 11/07/2016  . Acute renal failure (ARF) ([place]) 10/04/2016  . Nephrolithiasis   . Staghorn calculus   . Acute renal failure with tubular necrosis ([place]) 08/30/2016    Past Surgical History:  Procedure Laterality Date  . CYSTOSCOPY/URETEROSCOPY/HOLMIUM LASER/STENT PLACEMENT Right 02/21/2017   Procedure: CYSTOSCOPY/URETEROSCOPY/HOLMIUM LASER/STENT EXCHANGE;   Surgeon: [name], MD;  Location: [facility] ORS;  Service: Urology;  Laterality: Right;  . IR NEPHROSTOMY PLACEMENT RIGHT  01/23/2017  . NEPHROLITHOTOMY Right 01/24/2017   Procedure: NEPHROLITHOTOMY PERCUTANEOUS;  Surgeon: [name], MD;  Location: [facility] ORS;  Service: Urology;  Laterality: Right;  . TUBAL LIGATION      Prior to Admission medications   Medication Sig Start Date End Date Taking? Authorizing Provider  acetaminophen (TYLENOL) 500 MG tablet Take 1-2 tablets (500-1,000 mg total) by mouth every 6 (six) hours as needed for mild pain. Patient not taking: Reported on 06/26/2017 10/12/16   Max Sane, MD  aspirin 325 MG tablet Take 1 tablet (325 mg total) by mouth daily as needed for headache. 07/30/17   Loletha Grayer, MD  hydroxychloroquine (PLAQUENIL) 200 MG tablet Take 1 tablet (200 mg total) by mouth daily. 07/30/17   Loletha Grayer, MD  predniSONE (DELTASONE) 10 MG tablet Take 1 tablet (10 mg total) by mouth daily with breakfast. 07/31/17   Loletha Grayer, MD    Allergies Penicillins  Family History  Problem Relation Age of Onset  . Thyroid disease Mother   . Thyroid disease Father   . Diabetes Father     Social History Social History   Tobacco Use  . Smoking status: Current Every Day Smoker    Packs/day: 1.00    Years: 15.00    Pack years: 15.00    Types: Cigarettes  . Smokeless tobacco: Never Used  Substance Use Topics  . Alcohol use: No  . Drug  use: Yes    Types: Marijuana    Comment: OCC    Review of Systems Constitutional: Positive for fever/chills ENT: Negative for sore throat. Cardiovascular: Denies chest pain. Respiratory: No shortness of breath. Positive for cough. Gastrointestinal: Positive for nausea,  Positive for vomiting.  Negative diarrhea.  Musculoskeletal: Positive for body aches Skin: Negative for rash. Neurological: Positive for headaches ____________________________________________   PHYSICAL EXAM:  VITAL SIGNS: ED  Triage Vitals  Enc Vitals Group     BP 11/13/17 1843 133/89     Pulse Rate 11/13/17 1843 (!) 120     Resp 11/13/17 1843 20     Temp 11/13/17 1843 100.1 F (37.8 C)     Temp Source 11/13/17 1843 Oral     SpO2 11/13/17 1843 99 %     Weight 11/13/17 1844 107 lb (48.5 kg)     Height 11/13/17 1844 5\' 2"  (1.575 m)     Head Circumference --      Peak Flow --      Pain Score 11/13/17 1848 10     Pain Loc --      Pain Edu? --      Excl. in [place]? --     Constitutional: Alert and oriented. Acutely ill appearing and in no acute distress. Eyes: Conjunctivae are normal. EOMI. Ears: TM normal Nose: No congestion noted; no rhinnorhea. Mouth/Throat: Mucous membranes are moist.  Oropharynx normal. Tonsils 1+ without.. Neck: No stridor.  Lymphatic: No cervical lymphadenopathy. Cardiovascular: Normal rate, regular rhythm. Good peripheral circulation. Respiratory: Normal respiratory effort.  No retractions.  Breath sounds are clear to auscultation.   Gastrointestinal: Soft and nontender.  Musculoskeletal: FROM x 4 extremities.  Neurologic:  Normal speech and language.  Skin:  Skin is warm, dry and intact. No rash noted. Psychiatric: Mood and affect are normal. Speech and behavior are normal.  ____________________________________________   LABS (all labs ordered are listed, but only abnormal results are displayed)  Labs Reviewed - No data to display ____________________________________________  EKG  Not indicated ____________________________________________  RADIOLOGY  Not indicated ____________________________________________   PROCEDURES  Procedure(s) performed: None  Critical Care performed: No ____________________________________________   INITIAL IMPRESSION / ASSESSMENT AND PLAN / ED COURSE  37 year old female presenting to the emergency department for treatment and evaluation of symptoms most consistent with influenza. She will be treated with Tamiflu and Robitussin AC.   She was instructed to follow-up with her primary care provider for symptoms that are not improving over the week.  She was instructed to return to the emergency department for symptoms of change or worsen if she is unable to schedule appointment.  Medications - No data to display  ED Discharge Orders    None      Pertinent labs & imaging results that were available during my care of the patient were reviewed by me and considered in my medical decision making (see chart for details).    If controlled substance prescribed during this visit, 12 month history viewed on the [place] prior to issuing an initial prescription for Schedule II or III opiod. ____________________________________________   FINAL CLINICAL IMPRESSION(S) / ED DIAGNOSES  Final diagnoses:  None    Note:  This document was prepared using Dragon voice recognition software and may include unintentional dictation errors.     Victorino Dike, FNP 11/14/17 0037    Nena Polio, MD 11/14/17 [phone]

## 2017-11-13 NOTE — ED Notes (Signed)
Lab states they do have a flu swab for the patient already and will process it at this time.

## 2018-04-26 ENCOUNTER — Other Ambulatory Visit: Payer: Self-pay

## 2018-04-26 ENCOUNTER — Encounter: Payer: Self-pay | Admitting: Emergency Medicine

## 2018-04-26 ENCOUNTER — Inpatient Hospital Stay: Payer: Medicaid Other

## 2018-04-26 ENCOUNTER — Inpatient Hospital Stay
Admission: EM | Admit: 2018-04-26 | Discharge: 2018-05-01 | DRG: 684 | Disposition: A | Discharge status: Home / Self Care | Payer: Medicaid Other | Attending: Internal Medicine | Admitting: Internal Medicine

## 2018-04-26 DIAGNOSIS — Z87442 Personal history of urinary calculi: Secondary | ICD-10-CM

## 2018-04-26 DIAGNOSIS — R110 Nausea: Secondary | ICD-10-CM | POA: Diagnosis not present

## 2018-04-26 DIAGNOSIS — F17210 Nicotine dependence, cigarettes, uncomplicated: Secondary | ICD-10-CM | POA: Diagnosis present

## 2018-04-26 DIAGNOSIS — R809 Proteinuria, unspecified: Secondary | ICD-10-CM | POA: Diagnosis present

## 2018-04-26 DIAGNOSIS — Z7982 Long term (current) use of aspirin: Secondary | ICD-10-CM

## 2018-04-26 DIAGNOSIS — I129 Hypertensive chronic kidney disease with stage 1 through stage 4 chronic kidney disease, or unspecified chronic kidney disease: Secondary | ICD-10-CM | POA: Diagnosis present

## 2018-04-26 DIAGNOSIS — N184 Chronic kidney disease, stage 4 (severe): Secondary | ICD-10-CM | POA: Diagnosis present

## 2018-04-26 DIAGNOSIS — E860 Dehydration: Secondary | ICD-10-CM | POA: Diagnosis present

## 2018-04-26 DIAGNOSIS — Z7951 Long term (current) use of inhaled steroids: Secondary | ICD-10-CM

## 2018-04-26 DIAGNOSIS — R112 Nausea with vomiting, unspecified: Secondary | ICD-10-CM

## 2018-04-26 DIAGNOSIS — Z716 Tobacco abuse counseling: Secondary | ICD-10-CM

## 2018-04-26 DIAGNOSIS — D631 Anemia in chronic kidney disease: Secondary | ICD-10-CM | POA: Diagnosis present

## 2018-04-26 DIAGNOSIS — M3214 Glomerular disease in systemic lupus erythematosus: Secondary | ICD-10-CM | POA: Diagnosis present

## 2018-04-26 DIAGNOSIS — Z79899 Other long term (current) drug therapy: Secondary | ICD-10-CM | POA: Diagnosis not present

## 2018-04-26 DIAGNOSIS — N179 Acute kidney failure, unspecified: Principal | ICD-10-CM

## 2018-04-26 DIAGNOSIS — N200 Calculus of kidney: Secondary | ICD-10-CM | POA: Diagnosis present

## 2018-04-26 DIAGNOSIS — Z880 Allergy status to penicillin: Secondary | ICD-10-CM | POA: Diagnosis not present

## 2018-04-26 DIAGNOSIS — N189 Chronic kidney disease, unspecified: Secondary | ICD-10-CM

## 2018-04-26 DIAGNOSIS — Z86711 Personal history of pulmonary embolism: Secondary | ICD-10-CM | POA: Diagnosis not present

## 2018-04-26 DIAGNOSIS — N289 Disorder of kidney and ureter, unspecified: Secondary | ICD-10-CM

## 2018-04-26 DIAGNOSIS — R197 Diarrhea, unspecified: Secondary | ICD-10-CM | POA: Diagnosis present

## 2018-04-26 HISTORY — DX: Unspecified osteoarthritis, unspecified site: M19.90

## 2018-04-26 LAB — CBC
HEMATOCRIT: 25.8 % — AB (ref 35.0–47.0)
HEMOGLOBIN: 8.8 g/dL — AB (ref 12.0–16.0)
MCH: 32 pg (ref 26.0–34.0)
MCHC: 33.9 g/dL (ref 32.0–36.0)
MCV: 94.4 fL (ref 80.0–100.0)
Platelets: 123 10*3/uL — ABNORMAL LOW (ref 150–440)
RBC: 2.74 MIL/uL — ABNORMAL LOW (ref 3.80–5.20)
RDW: 14 % (ref 11.5–14.5)
WBC: 5.1 10*3/uL (ref 3.6–11.0)

## 2018-04-26 LAB — URINALYSIS, ROUTINE W REFLEX MICROSCOPIC
BILIRUBIN URINE: NEGATIVE
Glucose, UA: NEGATIVE mg/dL
HGB URINE DIPSTICK: NEGATIVE
Ketones, ur: NEGATIVE mg/dL
NITRITE: NEGATIVE
PH: 6 (ref 5.0–8.0)
Protein, ur: 100 mg/dL — AB
Specific Gravity, Urine: 1.009 (ref 1.005–1.030)

## 2018-04-26 LAB — COMPREHENSIVE METABOLIC PANEL
ALK PHOS: 62 U/L (ref 38–126)
ALT: 11 U/L (ref 0–44)
ANION GAP: 6 (ref 5–15)
AST: 19 U/L (ref 15–41)
Albumin: 3.4 g/dL — ABNORMAL LOW (ref 3.5–5.0)
BILIRUBIN TOTAL: 0.4 mg/dL (ref 0.3–1.2)
BUN: 39 mg/dL — ABNORMAL HIGH (ref 6–20)
CO2: 23 mmol/L (ref 22–32)
Calcium: 8.3 mg/dL — ABNORMAL LOW (ref 8.9–10.3)
Chloride: 112 mmol/L — ABNORMAL HIGH (ref 98–111)
Creatinine, Ser: 3.85 mg/dL — ABNORMAL HIGH (ref 0.44–1.00)
GFR, EST AFRICAN AMERICAN: 16 mL/min — AB (ref >60)
GFR, EST NON AFRICAN AMERICAN: 14 mL/min — AB (ref >60)
Glucose, Bld: 98 mg/dL (ref 70–99)
Potassium: 4.1 mmol/L (ref 3.5–5.1)
Sodium: 141 mmol/L (ref 135–145)
TOTAL PROTEIN: 7 g/dL (ref 6.5–8.1)

## 2018-04-26 LAB — PROTEIN / CREATININE RATIO, URINE
CREATININE, URINE: 94 mg/dL
PROTEIN CREATININE RATIO: 2.46 mg/mg{creat} — AB (ref 0.00–0.15)
Total Protein, Urine: 231 mg/dL

## 2018-04-26 LAB — LIPASE, BLOOD: Lipase: 33 U/L (ref 11–51)

## 2018-04-26 LAB — POCT PREGNANCY, URINE: Preg Test, Ur: NEGATIVE

## 2018-04-26 MED ORDER — ACETAMINOPHEN 500 MG PO TABS: 500.0000 mg | ORAL_TABLET | Freq: Four times a day (QID) | ORAL | Status: DC | PRN

## 2018-04-26 MED ORDER — SODIUM CHLORIDE 0.9 % IV BOLUS
1000.0000 mL | Freq: Once | INTRAVENOUS | Status: AC
  Administered 2018-04-26: 1000 mL via INTRAVENOUS

## 2018-04-26 MED ORDER — SODIUM CHLORIDE 0.9 % IV SOLN
INTRAVENOUS | Status: DC
  Administered 2018-04-26 – 2018-04-30 (×7): via INTRAVENOUS

## 2018-04-26 MED ORDER — ONDANSETRON HCL 4 MG/2ML IJ SOLN
4.0000 mg | Freq: Once | INTRAMUSCULAR | Status: AC
  Administered 2018-04-26: 4 mg via INTRAVENOUS
  Filled 2018-04-26: qty 2

## 2018-04-26 MED ORDER — METHYLPREDNISOLONE SODIUM SUCC 125 MG IJ SOLR
125.0000 mg | Freq: Every day | INTRAMUSCULAR | Status: DC
  Administered 2018-04-26 – 2018-05-01 (×6): 125 mg via INTRAVENOUS
  Filled 2018-04-26 (×6): qty 2

## 2018-04-26 MED ORDER — HEPARIN SODIUM (PORCINE) 5000 UNIT/ML IJ SOLN
5000.0000 [IU] | Freq: Three times a day (TID) | INTRAMUSCULAR | Status: DC
  Administered 2018-04-26 – 2018-04-28 (×5): 5000 [IU] via SUBCUTANEOUS
  Filled 2018-04-26 (×8): qty 1

## 2018-04-26 MED ORDER — DOCUSATE SODIUM 100 MG PO CAPS: 100.0000 mg | ORAL_CAPSULE | Freq: Two times a day (BID) | ORAL | Status: DC | PRN

## 2018-04-26 MED ORDER — ASPIRIN EC 325 MG PO TBEC: 325.0000 mg | DELAYED_RELEASE_TABLET | Freq: Every day | ORAL | Status: DC | PRN

## 2018-04-26 MED ORDER — KETOROLAC TROMETHAMINE 30 MG/ML IJ SOLN
30.0000 mg | Freq: Once | INTRAMUSCULAR | Status: AC
  Administered 2018-04-26: 30 mg via INTRAVENOUS
  Filled 2018-04-26: qty 1

## 2018-04-26 NOTE — H&P (Signed)
[facility] NAME: Patricia Maldonado    MR#:  [id number]  DATE OF BIRTH:  07-25-1981  DATE OF ADMISSION:  04/26/2018  PRIMARY CARE PHYSICIAN: Letta Median, MD   REQUESTING/REFERRING PHYSICIAN: [email or web address]  CHIEF COMPLAINT:   Chief Complaint  Patient presents with  . Nausea    HISTORY OF PRESENT ILLNESS: Patricia Maldonado  is a 37 y.o. female with a known history of anemia, arthritis, lupus, hypertension, presents to the emergency department for nausea vomiting and diarrhea.  According to the patient for the past 2 to 3 days she has had intermittent episodes of diarrhea, states the diarrhea has since resolved but she is nauseated with intermittent vomiting.  Also states she has the urge to urinate, states she urinates frequently throughout the day but even after urinating she feels like she immediately needs to urinate again.  Denies any dysuria.  Denies any abdominal pain states it is more an urgency to urinate in the lower belly. In ER noted to have Worsening in renal func, so after talking to Nephrologist- suggest to admit for further work up. As per pt, her medicaid did not cover for " other medicine" for her lupus after she finished her prednisone ( likely Hydroxychloroquine), so she is not taking it.  PAST MEDICAL HISTORY:   Past Medical History:  Diagnosis Date  . Acute renal failure (ARF) ([place])   . Anemia   . Arthritis   . History of kidney stones   . Hypertension    OFF BP MEDS X 3 WEEKS DUE TO BP CONTROL  . 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 Rash   . Staghorn calculus   . 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 CYSTOSCOPY/URETEROSCOPY/HOLMIUM LASER/STENT PLACEMENT Right 02/21/2017   Procedure: CYSTOSCOPY/URETEROSCOPY/HOLMIUM LASER/STENT EXCHANGE;  Surgeon: [name], MD;  Location: [facility]  ORS;  Service: Urology;  Laterality: Right;  . IR NEPHROSTOMY PLACEMENT RIGHT  01/23/2017  . NEPHROLITHOTOMY Right 01/24/2017   Procedure: NEPHROLITHOTOMY PERCUTANEOUS;  Surgeon: [name], MD;  Location: [facility] ORS;  Service: Urology;  Laterality: Right;  . TUBAL LIGATION      SOCIAL HISTORY:  Social History   Tobacco Use  . Smoking status: Current Every Day Smoker    Packs/day: 1.00    Years: 15.00    Pack years: 15.00    Types: Cigarettes  . Smokeless tobacco: Never Used  Substance Use Topics  . Alcohol use: No    FAMILY HISTORY:  Family History  Problem Relation Age of Onset  . Thyroid disease Mother   . Thyroid disease Father   . Diabetes Father     DRUG ALLERGIES:  Allergies  Allergen Reactions  . Penicillins Swelling    Yeast infection Has patient had a PCN reaction causing immediate rash, facial/tongue/throat swelling, SOB or lightheadedness with hypotension:No Has patient had a PCN reaction causing severe rash involving mucus membranes or skin necrosis: Yes Has patient had a PCN reaction that required hospitalization No Has patient had a PCN reaction occurring within the last 10 years: No If all of the above answers are "NO", then may proceed with Cephalosporin use.     REVIEW OF SYSTEMS:   CONSTITUTIONAL: No fever,have generalized fatigue or weakness.  EYES: No blurred or double vision.  EARS, NOSE, AND THROAT: No tinnitus or ear pain.  RESPIRATORY: No cough, shortness of breath, wheezing or hemoptysis.  CARDIOVASCULAR: No chest pain, orthopnea, edema.  GASTROINTESTINAL: No nausea, vomiting, diarrhea or abdominal pain.  GENITOURINARY: No dysuria, hematuria.  ENDOCRINE: No polyuria, nocturia,  HEMATOLOGY: No anemia, easy bruising or bleeding SKIN: No rash or lesion. MUSCULOSKELETAL: No joint pain or arthritis.   NEUROLOGIC: No tingling, numbness, weakness.  PSYCHIATRY: No anxiety or depression.   MEDICATIONS AT HOME:  Prior to Admission medications    Medication Sig Start Date End Date Taking? Authorizing Provider  acetaminophen (TYLENOL) 500 MG tablet Take 1-2 tablets (500-1,000 mg total) by mouth every 6 (six) hours as needed for mild pain. Patient not taking: Reported on 06/26/2017 10/12/16   Max Sane, MD  aspirin 325 MG tablet Take 1 tablet (325 mg total) by mouth daily as needed for headache. 07/30/17   Wieting, Richard, MD  guaiFENesin-codeine (ROBITUSSIN AC) 100-10 MG/5ML syrup Take 5 mLs by mouth 3 (three) times daily as needed for cough. Patient not taking: Reported on 04/26/2018 11/13/17   Sherrie George B, FNP  hydroxychloroquine (PLAQUENIL) 200 MG tablet Take 1 tablet (200 mg total) by mouth daily. Patient not taking: Reported on 04/26/2018 07/30/17   Loletha Grayer, MD  predniSONE (DELTASONE) 10 MG tablet Take 1 tablet (10 mg total) by mouth daily with breakfast. Patient not taking: Reported on 04/26/2018 07/31/17   Loletha Grayer, MD      PHYSICAL EXAMINATION:   VITAL SIGNS: Blood pressure (!) 121/100, pulse 64, temperature 97.8 F (36.6 C), temperature source Oral, resp. rate 18, height 5\' 2"  (1.575 m), weight 41.3 kg (91 lb), last menstrual period 04/13/2018, SpO2 100 %.  GENERAL:  37 y.o.-year-old patient lying in the bed with no acute distress.  EYES: Pupils equal, round, reactive to light and accommodation. No scleral icterus. Extraocular muscles intact.  HEENT: Head atraumatic, normocephalic. Oropharynx and nasopharynx clear.  NECK:  Supple, no jugular venous distention. No thyroid enlargement, no tenderness.  LUNGS: Normal breath sounds bilaterally, no wheezing, rales,rhonchi or crepitation. No use of accessory muscles of respiration.  CARDIOVASCULAR: S1, S2 normal. No murmurs, rubs, or gallops.  ABDOMEN: Soft, nontender, nondistended. Bowel sounds present. No organomegaly or mass.  EXTREMITIES: No pedal edema, cyanosis, or clubbing.  NEUROLOGIC: Cranial nerves II through XII are intact. Muscle strength 5/5 in  all extremities. Sensation intact. Gait not checked.  PSYCHIATRIC: The patient is alert and oriented x 3.  SKIN: No obvious rash, lesion, or ulcer.   LABORATORY PANEL:   CBC Recent Labs  Lab 04/26/18 0833  WBC 5.1  HGB 8.8*  HCT 25.8*  PLT 123*  MCV 94.4  MCH 32.0  MCHC 33.9  RDW 14.0   ------------------------------------------------------------------------------------------------------------------  Chemistries  Recent Labs  Lab 04/26/18 0833  NA 141  K 4.1  CL 112*  CO2 23  GLUCOSE 98  BUN 39*  CREATININE 3.85*  CALCIUM 8.3*  AST 19  ALT 11  ALKPHOS 62  BILITOT 0.4   ------------------------------------------------------------------------------------------------------------------ estimated creatinine clearance is 13.3 mL/min (A) (by C-G formula based on SCr of 3.85 mg/dL (H)). ------------------------------------------------------------------------------------------------------------------ No results for input(s): TSH, T4TOTAL, T3FREE, THYROIDAB in the last 72 hours.  Invalid input(s): FREET3   Coagulation profile No results for input(s): INR, PROTIME in the last 168 hours. ------------------------------------------------------------------------------------------------------------------- No results for input(s): DDIMER in the last 72 hours. -------------------------------------------------------------------------------------------------------------------  Cardiac Enzymes No results for input(s): CKMB, TROPONINI, MYOGLOBIN in the last 168 hours.  Invalid input(s): CK ------------------------------------------------------------------------------------------------------------------ Invalid input(s): POCBNP  ---------------------------------------------------------------------------------------------------------------  Urinalysis  Component Value Date/Time   COLORURINE STRAW (A) 04/26/2018 0906   APPEARANCEUR HAZY (A) 04/26/2018 0906   APPEARANCEUR  Cloudy 09/16/2014 0814   LABSPEC 1.009 04/26/2018 0906   LABSPEC 1.016 09/16/2014 0814   PHURINE 6.0 04/26/2018 0906   GLUCOSEU NEGATIVE 04/26/2018 0906   GLUCOSEU Negative 09/16/2014 0814   HGBUR NEGATIVE 04/26/2018 0906   BILIRUBINUR NEGATIVE 04/26/2018 0906   BILIRUBINUR Negative 09/16/2014 0814   KETONESUR NEGATIVE 04/26/2018 0906   PROTEINUR 100 (A) 04/26/2018 0906   NITRITE NEGATIVE 04/26/2018 0906   LEUKOCYTESUR TRACE (A) 04/26/2018 0906   LEUKOCYTESUR 3+ 09/16/2014 0814     RADIOLOGY: No results found.  EKG: Orders placed or performed during the hospital encounter of 07/27/17  . EKG 12-Lead  . EKG 12-Lead  . EKG    IMPRESSION AND PLAN:  * Ac on ch renal failure   IV fluids, avoid nephrotoxics.   Nephro consult.  * Lupus   Not on meds currently.   Nephro to help.  * Anemia of chronic disease, due to renal failure   Monitor.  * Smoking- Tobacco abuse   Counseled to quit, time spent for that is 4 min.  * nausea   This may be due to renal failure   Symptomatic management with zofran   All the records are reviewed and case discussed with ED provider. Management plans discussed with the patient, family and they are in agreement.  CODE STATUS: Full. Code Status History    Date Active Date Inactive Code Status Order ID Comments User Context   07/27/2017 1657 07/30/2017 1850 Full Code [id number]  Bettey Costa, MD Inpatient   03/01/2017 1356 03/03/2017 1917 Full Code [id number]  Max Sane, MD Inpatient   01/23/2017 1200 01/25/2017 2151 Full Code [id number]  [name], MD Inpatient   10/04/2016 2150 10/12/2016 1504 Full Code [id number]  Henreitta Leber, MD Inpatient   08/30/2016 1913 09/04/2016 1443 Full Code [id number]  Hower, Aaron Mose, MD ED       TOTAL TIME TAKING CARE OF THIS PATIENT: 50 minutes.    Vaughan Basta M.D on 04/26/2018   Between 7am to 6pm - Pager - [phone]  After 6pm go to [email or web address] - password [email or web address]  Office  [phone]  CC: Primary care physician; Letta Median, MD   Note: This dictation was prepared with Dragon dictation along with smaller phrase technology. Any transcriptional errors that result from this process are unintentional.

## 2018-04-26 NOTE — ED Triage Notes (Addendum)
C/O nausea and diarrhea.  Diarrhea stopped 2 days ago.  Patient states she feels she feels drained and tired and is concerned she is dehydrated.  Patient also c/o urinary urgency and suprapubic "pressure" x 1 day.

## 2018-04-26 NOTE — ED Notes (Signed)
Danae Chen, RN given report.

## 2018-04-26 NOTE — ED Provider Notes (Signed)
[facility] Emergency Department Provider Note  Time seen: 8:42 AM  I have reviewed the triage vital signs and the nursing notes.   HISTORY  Chief Complaint Nausea    HPI Patricia Maldonado is a 37 y.o. female with a past medical history of anemia, arthritis, lupus, hypertension, presents to the emergency department for nausea vomiting and diarrhea.  According to the patient for the past 2 to 3 days she has had intermittent episodes of diarrhea, states the diarrhea has since resolved but she is nauseated with intermittent vomiting.  Also states she has the urge to urinate, states she urinates frequently throughout the day but even after urinating she feels like she immediately needs to urinate again.  Denies any dysuria.  Denies any abdominal pain states it is more an urgency to urinate in the lower belly.   Past Medical History:  Diagnosis Date  . Acute renal failure (ARF) ([place])   . Anemia   . Arthritis   . History of kidney stones   . Hypertension    OFF BP MEDS X 3 WEEKS DUE TO BP CONTROL  . 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 Rash   . Staghorn calculus   . 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 Acute kidney injury ([place]) 07/27/2017  . PE (pulmonary thromboembolism) ([place]) 03/01/2017  . Kidney stone on left side 01/23/2017  . Systemic lupus erythematosus ([place]) 12/08/2016  . Encounter for long-term (current) use of high-risk medication 12/08/2016  . 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 Lupus nephritis ([place]) 11/07/2016  . Acute renal failure (ARF) ([place]) 10/04/2016  . Nephrolithiasis   . Staghorn calculus   . Acute renal failure with tubular necrosis ([place]) 08/30/2016    Past Surgical History:  Procedure Laterality Date  . CYSTOSCOPY/URETEROSCOPY/HOLMIUM LASER/STENT PLACEMENT Right 02/21/2017   Procedure: CYSTOSCOPY/URETEROSCOPY/HOLMIUM LASER/STENT  EXCHANGE;  Surgeon: [name], MD;  Location: [facility] ORS;  Service: Urology;  Laterality: Right;  . IR NEPHROSTOMY PLACEMENT RIGHT  01/23/2017  . NEPHROLITHOTOMY Right 01/24/2017   Procedure: NEPHROLITHOTOMY PERCUTANEOUS;  Surgeon: [name], MD;  Location: [facility] ORS;  Service: Urology;  Laterality: Right;  . TUBAL LIGATION      Prior to Admission medications   Medication Sig Start Date End Date Taking? Authorizing Provider  acetaminophen (TYLENOL) 500 MG tablet Take 1-2 tablets (500-1,000 mg total) by mouth every 6 (six) hours as needed for mild pain. Patient not taking: Reported on 06/26/2017 10/12/16   Max Sane, MD  aspirin 325 MG tablet Take 1 tablet (325 mg total) by mouth daily as needed for headache. 07/30/17   Wieting, Richard, MD  guaiFENesin-codeine (ROBITUSSIN AC) 100-10 MG/5ML syrup Take 5 mLs by mouth 3 (three) times daily as needed for cough. 11/13/17   Triplett, Johnette Abraham B, FNP  hydroxychloroquine (PLAQUENIL) 200 MG tablet Take 1 tablet (200 mg total) by mouth daily. 07/30/17   Loletha Grayer, MD  predniSONE (DELTASONE) 10 MG tablet Take 1 tablet (10 mg total) by mouth daily with breakfast. 07/31/17   Loletha Grayer, MD    Allergies  Allergen Reactions  . Penicillins Swelling    Yeast infection Has patient had a PCN reaction causing immediate rash, facial/tongue/throat swelling, SOB or lightheadedness with hypotension:No Has patient had a PCN reaction causing severe rash involving mucus membranes or skin necrosis: Yes Has patient had a PCN reaction that required hospitalization No  Has patient had a PCN reaction occurring within the last 10 years: No If all of the above answers are "NO", then may proceed with Cephalosporin use.     Family History  Problem Relation Age of Onset  . Thyroid disease Mother   . Thyroid disease Father   . Diabetes Father     Social History Social History   Tobacco Use  . Smoking status: Current Every Day Smoker    Packs/day:  1.00    Years: 15.00    Pack years: 15.00    Types: Cigarettes  . Smokeless tobacco: Never Used  Substance Use Topics  . Alcohol use: No  . Drug use: Yes    Types: Marijuana    Comment: OCC    Review of Systems Constitutional: Negative for fever.  Positive for generalized weakness Cardiovascular: Negative for chest pain. Respiratory: Negative for shortness of breath. Gastrointestinal: Negative for abdominal pain.  Positive for intermittent nausea vomiting, diarrhea several days ago. Genitourinary: States urinary frequency and urgency Musculoskeletal: Negative for musculoskeletal complaints Skin: Negative for skin complaints  Neurological: Negative for headache All other ROS negative  ____________________________________________   PHYSICAL EXAM:  VITAL SIGNS: ED Triage Vitals  Enc Vitals Group     BP 04/26/18 0823 110/81     Pulse Rate 04/26/18 0823 81     Resp --      Temp 04/26/18 0823 97.8 F (36.6 C)     Temp Source 04/26/18 0823 Oral     SpO2 04/26/18 0823 100 %     Weight 04/26/18 0817 91 lb (41.3 kg)     Height 04/26/18 0817 5\' 2"  (1.575 m)     Head Circumference --      Peak Flow --      Pain Score 04/26/18 0817 0     Pain Loc --      Pain Edu? --      Excl. in [place]? --     Constitutional: Alert and oriented. Well appearing and in no distress. Eyes: Normal exam ENT   Head: Normocephalic and atraumatic   Mouth/Throat: Mucous membranes are moist. Cardiovascular: Normal rate, regular rhythm. No murmur Respiratory: Normal respiratory effort without tachypnea nor retractions. Breath sounds are clear Gastrointestinal: Soft, mild suprapubic tenderness, no rebound or guarding, no distention. Musculoskeletal: Nontender with normal range of motion in all extremities.  Neurologic:  Normal speech and language. No gross focal neurologic deficits  Skin:  Skin is warm, dry and intact.  Psychiatric: Mood and affect are normal.    ____________________________________________   INITIAL IMPRESSION / ASSESSMENT AND PLAN / ED COURSE  Pertinent labs & imaging results that were available during my care of the patient were reviewed by me and considered in my medical decision making (see chart for details).  Patient presents to the emergency department for nausea vomiting, diarrhea that has resolved and urinary frequency/urgency.  Differential is quite broad but would include gastroenteritis, urinary tract infection, pyelonephritis, gastritis.  We will check labs, IV hydrate, treat discomfort and nausea.  Patient agreeable to plan of care.  Patient's labs show significant renal dysfunction with a creatinine 3.85.  Baseline appears to run between 1.5 and 2.1.  I discussed the patient with nephrology, they agree the patient should be admitted to the hospital for IV hydration.  Patient agreeable plan of care as well.  ____________________________________________   FINAL CLINICAL IMPRESSION(S) / ED DIAGNOSES  Nausea vomiting Dehydration Acute on chronic renal insufficiency   Harvest Dark, MD 04/26/18 1046

## 2018-04-26 NOTE — Progress Notes (Signed)
[facility], [place] 04/26/18  Subjective:   Nausea and vomiting in morning less than a month; losing weight (108-> 91 lbs) Vomiting after coughing After voiding feels like dripping Diarrhea 2 days ago Feel dehydrated Poor appetite   Objective:  Vital signs in last 24 hours:  Temp:  [97.8 F (36.6 C)] 97.8 F (36.6 C) (07/25 0823) Pulse Rate:  [64-81] 65 (07/25 1315) Resp:  [16-18] 16 (07/25 1315) BP: (104-121)/(81-100) 114/84 (07/25 1315) SpO2:  [100 %] 100 % (07/25 1315) Weight:  [41.3 kg (91 lb)] 41.3 kg (91 lb) (07/25 0817)  Weight change:  Filed Weights   04/26/18 0817  Weight: 41.3 kg (91 lb)    Intake/Output:    Intake/Output Summary (Last 24 hours) at 04/26/2018 1358 Last data filed at 04/26/2018 1245 Gross per 24 hour  Intake 1000 ml  Output -  Net 1000 ml     Physical Exam: General: NAD, laying in bed,   HEENT Moist mucus membranes  Neck supple  Pulm/lungs Clear b/l  CVS/Heart Regular, no rub  Abdomen:  Soft NT  Extremities: No edema  Neurologic: Alert, oreinted  Skin: Left wrist cyst, tender          Basic Metabolic Panel:  Recent Labs  Lab 04/26/18 0833  NA 141  K 4.1  CL 112*  CO2 23  GLUCOSE 98  BUN 39*  CREATININE 3.85*  CALCIUM 8.3*     CBC: Recent Labs  Lab 04/26/18 0833  WBC 5.1  HGB 8.8*  HCT 25.8*  MCV 94.4  PLT 123*     No results found for: HEPBSAG, HEPBSAB, HEPBIGM    Microbiology:  No results found for this or any previous visit (from the past 240 hour(s)).  Coagulation Studies: No results for input(s): LABPROT, INR in the last 72 hours.  Urinalysis: Recent Labs    04/26/18 0906  COLORURINE STRAW*  LABSPEC 1.009  PHURINE 6.0  GLUCOSEU NEGATIVE  HGBUR NEGATIVE  BILIRUBINUR NEGATIVE  KETONESUR NEGATIVE  PROTEINUR 100*  NITRITE NEGATIVE  LEUKOCYTESUR TRACE*      Imaging: No results found.   Medications:   . sodium chloride 75 mL/hr at 04/26/18 1245       Assessment/ Plan:  37 y.o. African [place] female rt kidney stone extraction 01/2017, Lupus nephritis diagnosed (10/2016) treated with iv cytoxan and then lost to follow up  1. ARF  2. H/O Kidney stones 3. Lupus nephritis 4. Proteinuria  Patient was lost to follow up. She states she sid not follow up because of transportation problems Last known Creatinine 2.19/GFR 33 Now presents with Creatinine of 3.85/GFR 16 Systemic symptoms c/w uremia  Plan Renal ultrasound Iv hydration Complements Empiric iv solumedrol Electrolytes and volume status are acceptable. No acute indicatien of HD at present will continue to follow   LOS: 0 Kamille Toomey Candiss Norse 7/25/20191:58 PM  [place], [place]  Note: This note was prepared with Dragon dictation. Any transcription errors are unintentional

## 2018-04-27 LAB — CBC
HEMATOCRIT: 23.6 % — AB (ref 35.0–47.0)
HEMOGLOBIN: 7.9 g/dL — AB (ref 12.0–16.0)
MCH: 32.1 pg (ref 26.0–34.0)
MCHC: 33.7 g/dL (ref 32.0–36.0)
MCV: 95.1 fL (ref 80.0–100.0)
Platelets: 111 10*3/uL — ABNORMAL LOW (ref 150–440)
RBC: 2.48 MIL/uL — AB (ref 3.80–5.20)
RDW: 14.1 % (ref 11.5–14.5)
WBC: 2.5 10*3/uL — AB (ref 3.6–11.0)

## 2018-04-27 LAB — URINALYSIS, COMPLETE (UACMP) WITH MICROSCOPIC
BACTERIA UA: NONE SEEN
BILIRUBIN URINE: NEGATIVE
Glucose, UA: 50 mg/dL — AB
KETONES UR: NEGATIVE mg/dL
LEUKOCYTES UA: NEGATIVE
NITRITE: NEGATIVE
Protein, ur: >=300 mg/dL — AB
Specific Gravity, Urine: 1.012 (ref 1.005–1.030)
pH: 6 (ref 5.0–8.0)

## 2018-04-27 LAB — ANTI-DNA ANTIBODY, DOUBLE-STRANDED: DS DNA AB: 20 [IU]/mL — AB (ref 0–9)

## 2018-04-27 LAB — BASIC METABOLIC PANEL
ANION GAP: 4 — AB (ref 5–15)
BUN: 37 mg/dL — AB (ref 6–20)
CO2: 19 mmol/L — ABNORMAL LOW (ref 22–32)
Calcium: 8.2 mg/dL — ABNORMAL LOW (ref 8.9–10.3)
Chloride: 116 mmol/L — ABNORMAL HIGH (ref 98–111)
Creatinine, Ser: 3.74 mg/dL — ABNORMAL HIGH (ref 0.44–1.00)
GFR calc non Af Amer: 15 mL/min — ABNORMAL LOW (ref >60)
GFR, EST AFRICAN AMERICAN: 17 mL/min — AB (ref >60)
Glucose, Bld: 121 mg/dL — ABNORMAL HIGH (ref 70–99)
POTASSIUM: 4.9 mmol/L (ref 3.5–5.1)
SODIUM: 139 mmol/L (ref 135–145)

## 2018-04-27 LAB — HIV ANTIBODY (ROUTINE TESTING W REFLEX): HIV Screen 4th Generation wRfx: NONREACTIVE

## 2018-04-27 LAB — C4 COMPLEMENT: COMPLEMENT C4, BODY FLUID: 14 mg/dL (ref 14–44)

## 2018-04-27 LAB — C3 COMPLEMENT: C3 Complement: 73 mg/dL — ABNORMAL LOW (ref 82–167)

## 2018-04-27 LAB — PHOSPHORUS: Phosphorus: 3.9 mg/dL (ref 2.5–4.6)

## 2018-04-27 MED ORDER — ADULT MULTIVITAMIN W/MINERALS CH
1.0000 | ORAL_TABLET | Freq: Every day | ORAL | Status: DC
  Administered 2018-04-27 – 2018-04-29 (×3): 1 via ORAL
  Filled 2018-04-27 (×5): qty 1

## 2018-04-27 MED ORDER — TRAZODONE HCL 50 MG PO TABS
50.0000 mg | ORAL_TABLET | Freq: Every day | ORAL | Status: DC
  Administered 2018-04-27 – 2018-04-30 (×3): 50 mg via ORAL
  Filled 2018-04-27 (×3): qty 1

## 2018-04-27 MED ORDER — ENSURE ENLIVE PO LIQD
237.0000 mL | Freq: Three times a day (TID) | ORAL | Status: DC
  Administered 2018-04-27 – 2018-05-01 (×11): 237 mL via ORAL

## 2018-04-27 NOTE — Progress Notes (Signed)
Patricia Maldonado at [place] NAME: Patricia Maldonado    MR#:  [id number]  DATE OF BIRTH:  Jun 19, 1981  SUBJECTIVE:  CHIEF COMPLAINT:   Chief Complaint  Patient presents with  . Nausea   Patient came with generalized weakness and noted to have worsening in the renal function.  She was not taking her treatment for lupus as she could not afford it. Renal function is still almost same, nephrology started on steroids.  REVIEW OF SYSTEMS:  CONSTITUTIONAL: No fever,have fatigue or weakness.  EYES: No blurred or double vision.  EARS, NOSE, AND THROAT: No tinnitus or ear pain.  RESPIRATORY: No cough, shortness of breath, wheezing or hemoptysis.  CARDIOVASCULAR: No chest pain, orthopnea, edema.  GASTROINTESTINAL: No nausea, vomiting, diarrhea or abdominal pain.  GENITOURINARY: No dysuria, hematuria.  ENDOCRINE: No polyuria, nocturia,  HEMATOLOGY: No anemia, easy bruising or bleeding SKIN: No rash or lesion. MUSCULOSKELETAL: No joint pain or arthritis.   NEUROLOGIC: No tingling, numbness, weakness.  PSYCHIATRY: No anxiety or depression.   ROS  DRUG ALLERGIES:   Allergies  Allergen Reactions  . Penicillins Swelling    Yeast infection Has patient had a PCN reaction causing immediate rash, facial/tongue/throat swelling, SOB or lightheadedness with hypotension:No Has patient had a PCN reaction causing severe rash involving mucus membranes or skin necrosis: Yes Has patient had a PCN reaction that required hospitalization No Has patient had a PCN reaction occurring within the last 10 years: No If all of the above answers are "NO", then may proceed with Cephalosporin use.     VITALS:  Blood pressure 140/88, pulse 62, temperature 98.6 F (37 C), temperature source Axillary, resp. rate 18, height 5\' 2"  (1.575 m), weight 45 kg (99 lb 1.6 oz), last menstrual period 04/13/2018, SpO2 100 %.  PHYSICAL EXAMINATION:   GENERAL:  37 y.o.-year-old patient lying in  the bed with no acute distress.  EYES: Pupils equal, round, reactive to light and accommodation. No scleral icterus. Extraocular muscles intact.  HEENT: Head atraumatic, normocephalic. Oropharynx and nasopharynx clear.  NECK:  Supple, no jugular venous distention. No thyroid enlargement, no tenderness.  LUNGS: Normal breath sounds bilaterally, no wheezing, rales,rhonchi or crepitation. No use of accessory muscles of respiration.  CARDIOVASCULAR: S1, S2 normal. No murmurs, rubs, or gallops.  ABDOMEN: Soft, nontender, nondistended. Bowel sounds present. No organomegaly or mass.  EXTREMITIES: No pedal edema, cyanosis, or clubbing.  NEUROLOGIC: Cranial nerves II through XII are intact. Muscle strength 5/5 in all extremities. Sensation intact. Gait not checked.  PSYCHIATRIC: The patient is alert and oriented x 3.  SKIN: No obvious rash, lesion, or ulcer.     Physical Exam LABORATORY PANEL:   CBC Recent Labs  Lab 04/27/18 0428  WBC 2.5*  HGB 7.9*  HCT 23.6*  PLT 111*   ------------------------------------------------------------------------------------------------------------------  Chemistries  Recent Labs  Lab 04/26/18 0833 04/27/18 0428  NA 141 139  K 4.1 4.9  CL 112* 116*  CO2 23 19*  GLUCOSE 98 121*  BUN 39* 37*  CREATININE 3.85* 3.74*  CALCIUM 8.3* 8.2*  AST 19  --   ALT 11  --   ALKPHOS 62  --   BILITOT 0.4  --    ------------------------------------------------------------------------------------------------------------------  Cardiac Enzymes No results for input(s): TROPONINI in the last 168 hours. ------------------------------------------------------------------------------------------------------------------  RADIOLOGY:  US Renal  Result Date: 04/26/2018 CLINICAL DATA:  Acute renal failure EXAM: RENAL / URINARY TRACT ULTRASOUND COMPLETE COMPARISON:  08/31/2016 FINDINGS: Right Kidney: Length: 11.4 cm.  Increased cortical echogenicity. No hydronephrosis. No  solid mass. Multiple calculi are present in the collecting system of the right kidney. The largest measures 1.1 cm. Left Kidney: Length: 10.6 cm. Increased cortical echogenicity. No hydronephrosis or mass. Bladder: Within normal limits. Mild prominence of the wall likely reflects partial distention. IMPRESSION: No evidence of hydronephrosis. Right nephrolithiasis. Increased cortical echogenicity compatible with medical renal parenchymal disease. Electronically Signed   By: Marybelle Killings M.D.   On: 04/26/2018 15:00    ASSESSMENT AND PLAN:   Principal Problem:   Acute on chronic renal failure (HCC)  * Ac on ch renal failure   IV fluids, avoid nephrotoxics.   Nephro consult appreciated.  * Lupus   Not on meds currently.   Nephro to help.   IV steroids.  * Anemia of chronic disease, due to renal failure   Monitor.  * Smoking- Tobacco abuse   Counseled to quit, time spent for that is 4 min.  * nausea   This may be due to renal failure   Symptomatic management with zofran  All the records are reviewed and case discussed with Care Management/Social Workerr. Management plans discussed with the patient, family and they are in agreement.  CODE STATUS: Full.  TOTAL TIME TAKING CARE OF THIS PATIENT: 35 minutes.     POSSIBLE D/C IN 1-2 DAYS, DEPENDING ON CLINICAL CONDITION.   Vaughan Basta M.D on 04/27/2018   Between 7am to 6pm - Pager - [phone]  After 6pm go to [email or web address] - password [email or web address] [facility]  Office  [phone]  CC: Primary care physician; Letta Median, MD  Note: This dictation was prepared with Dragon dictation along with smaller phrase technology. Any transcriptional errors that result from this process are unintentional.

## 2018-04-27 NOTE — Clinical Social Work Note (Signed)
CSW consulted to see patient to assist with medications. CSW will discuss with RN CM as the RN CM will be the one to potentially assist with patient's medications. Shela Leff MSW,LCSW [phone]

## 2018-04-27 NOTE — Progress Notes (Signed)
[facility], [place] 04/27/18  Subjective:   Continues to have nausea, poor appetite No significant improvement in Creatinine with fluids and solumedrol  Objective:  Vital signs in last 24 hours:  Temp:  [98.4 F (36.9 C)] 98.4 F (36.9 C) (07/26 0419) Pulse Rate:  [58-76] 58 (07/26 0419) Resp:  [16-20] 20 (07/26 0419) BP: (112-138)/(82-100) 117/86 (07/26 0419) SpO2:  [100 %] 100 % (07/26 0419) Weight:  [43.4 kg (95 lb 10.9 oz)] 43.4 kg (95 lb 10.9 oz) (07/25 1800)  Weight change:  Filed Weights   04/26/18 0817 04/26/18 1800  Weight: 41.3 kg (91 lb) 43.4 kg (95 lb 10.9 oz)    Intake/Output:    Intake/Output Summary (Last 24 hours) at 04/27/2018 0944 Last data filed at 04/27/2018 0534 Gross per 24 hour  Intake 2511 ml  Output 400 ml  Net 2111 ml     Physical Exam: General: NAD, laying in bed,   HEENT Moist mucus membranes  Neck supple  Pulm/lungs Clear b/l  CVS/Heart Regular, no rub  Abdomen:  Soft NT  Extremities: No edema  Neurologic: Alert, oreinted  Skin: Left wrist cyst, tender          Basic Metabolic Panel:  Recent Labs  Lab 04/26/18 0833 04/27/18 0428  NA 141 139  K 4.1 4.9  CL 112* 116*  CO2 23 19*  GLUCOSE 98 121*  BUN 39* 37*  CREATININE 3.85* 3.74*  CALCIUM 8.3* 8.2*     CBC: Recent Labs  Lab 04/26/18 0833 04/27/18 0428  WBC 5.1 2.5*  HGB 8.8* 7.9*  HCT 25.8* 23.6*  MCV 94.4 95.1  PLT 123* 111*     No results found for: HEPBSAG, HEPBSAB, HEPBIGM    Microbiology:  No results found for this or any previous visit (from the past 240 hour(s)).  Coagulation Studies: No results for input(s): LABPROT, INR in the last 72 hours.  Urinalysis: Recent Labs    04/26/18 0906  COLORURINE STRAW*  LABSPEC 1.009  PHURINE 6.0  GLUCOSEU NEGATIVE  HGBUR NEGATIVE  BILIRUBINUR NEGATIVE  KETONESUR NEGATIVE  PROTEINUR 100*  NITRITE NEGATIVE  LEUKOCYTESUR TRACE*      Imaging: US Renal  Result  Date: 04/26/2018 CLINICAL DATA:  Acute renal failure EXAM: RENAL / URINARY TRACT ULTRASOUND COMPLETE COMPARISON:  08/31/2016 FINDINGS: Right Kidney: Length: 11.4 cm. Increased cortical echogenicity. No hydronephrosis. No solid mass. Multiple calculi are present in the collecting system of the right kidney. The largest measures 1.1 cm. Left Kidney: Length: 10.6 cm. Increased cortical echogenicity. No hydronephrosis or mass. Bladder: Within normal limits. Mild prominence of the wall likely reflects partial distention. IMPRESSION: No evidence of hydronephrosis. Right nephrolithiasis. Increased cortical echogenicity compatible with medical renal parenchymal disease. Electronically Signed   By: Marybelle Killings M.D.   On: 04/26/2018 15:00     Medications:   . sodium chloride 75 mL/hr at 04/27/18 0200   . heparin  5,000 Units Subcutaneous Q8H  . methylPREDNISolone (SOLU-MEDROL) injection  125 mg Intravenous Daily  . traZODone  50 mg Oral QHS     Assessment/ Plan:  37 y.o. African [place] female rt kidney stone extraction 01/2017, Lupus nephritis diagnosed (10/2016) treated with iv cytoxan and then lost to follow up  1. 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 right Kidney stones 3. Lupus nephritis 4. Proteinuria  Patient was lost to follow up. She states she sid not follow up because of transportation problems Last known Creatinine 2.19/GFR 33 (09/2017) Now presents  with Creatinine of 3.85/GFR 16 Systemic symptoms c/w uremia Renal ultrasound shows rt nephrolithiasis  Plan Continue iv hydration Complements C3 slightly low, C4 low limit of normal Continue Empiric iv solumedrol Electrolytes and volume status are acceptable. No acute indication of HD at present but patient became tearful when dialysis was mentioned.   will continue to follow   LOS: [place] 7/26/20199:44 AM  [place], [place]  Note: This note was prepared with Dragon  dictation. Any transcription errors are unintentional

## 2018-04-27 NOTE — Care Management (Signed)
PCP Bender at [facility].  Patient no longer has Medicaid listed as active.  RNCM following for discharge medication needs.

## 2018-04-27 NOTE — Progress Notes (Signed)
Initial Nutrition Assessment  DOCUMENTATION CODES:   Underweight  INTERVENTION:   Ensure Enlive po TID, each supplement provides 350 kcal and 20 grams of protein  MVI daily  Snacks  Liberalize diet   NUTRITION DIAGNOSIS:   Inadequate oral intake related to acute illness, poor appetite as evidenced by per patient/family report.  GOAL:   Patient will meet greater than or equal to 90% of their needs  MONITOR:   PO intake, Supplement acceptance, Labs, Weight trends, I & O's  REASON FOR ASSESSMENT:   Malnutrition Screening Tool    ASSESSMENT:   37 y.o. female with a past medical history of anemia, arthritis, lupus, kidney stones, hypertension, presents to the emergency department for nausea vomiting and diarrhea.     Met with pt in room today. Pt reports poor appetite and oral intake ever since her Lupus diagnosis in 10/2016. Pt reports that she just has no desire to eat. Pt is uremic which may be adding to her poor appetite. Pt reports that she had stopped taking her medications at home and was supposed to be drinking supplements but she had not been because of financial reasons. Per chart, pt has lost 8lbs(8%) which pt reports has been over the past month. Pt is willing to drink Ensure while in hospital. RD will add supplements and MVI to help pt meet her estimated needs. RD will also liberalize diet to try and encourage po intake as pt's K and P are wnl. If pt's oral intake does not improve, consider appetite stimulant. Pt may require HD per nephrology.    Medications reviewed and include: heparin, solu-medrol, NaCl '@75ml' /hr  Labs reviewed: K 4.9 wnl, BUN 37(H), creat 3.74(H), Ca 8.2(L), P 3.9 wnl Wbc- 2.5(L), Hgb 7.9(L), Hct 23.6(L)  NUTRITION - FOCUSED PHYSICAL EXAM:    Most Recent Value  Orbital Region  No depletion  Upper Arm Region  Mild depletion  Thoracic and Lumbar Region  Mild depletion  Buccal Region  No depletion  Temple Region  No depletion  Clavicle Bone  Region  Mild depletion  Clavicle and Acromion Bone Region  Mild depletion  Scapular Bone Region  Mild depletion  Dorsal Hand  Mild depletion  Patellar Region  Mild depletion  Anterior Thigh Region  Mild depletion  Posterior Calf Region  Mild depletion  Edema (RD Assessment)  None  Hair  Reviewed  Eyes  Reviewed  Mouth  Reviewed  Skin  Reviewed  Nails  Reviewed     Diet Order:   Diet Order           Diet renal with fluid restriction Room service appropriate? Yes; Fluid consistency: Thin  Diet effective now         EDUCATION NEEDS:   Education needs have been addressed  Skin:  Skin Assessment: Reviewed RN Assessment  Last BM:  7/24  Height:   Ht Readings from Last 1 Encounters:  04/26/18 '5\' 2"'  (1.575 m)    Weight:   Wt Readings from Last 1 Encounters:  04/27/18 99 lb 1.6 oz (45 kg)    Ideal Body Weight:  50 kg  BMI:  Body mass index is 18.13 kg/m.  Estimated Nutritional Needs:   Kcal:  1400-1700kcal/day   Protein:  68-77g/day   Fluid:  >1.4L/day   Koleen Distance MS, RD, LDN Pager #- [phone] Office#- [phone] After Hours Pager: [phone]

## 2018-04-28 LAB — BASIC METABOLIC PANEL
Anion gap: 4 — ABNORMAL LOW (ref 5–15)
BUN: 43 mg/dL — ABNORMAL HIGH (ref 6–20)
CALCIUM: 7.9 mg/dL — AB (ref 8.9–10.3)
CO2: 19 mmol/L — ABNORMAL LOW (ref 22–32)
CREATININE: 3.82 mg/dL — AB (ref 0.44–1.00)
Chloride: 116 mmol/L — ABNORMAL HIGH (ref 98–111)
GFR calc Af Amer: 17 mL/min — ABNORMAL LOW (ref >60)
GFR calc non Af Amer: 14 mL/min — ABNORMAL LOW (ref >60)
Glucose, Bld: 105 mg/dL — ABNORMAL HIGH (ref 70–99)
Potassium: 4.6 mmol/L (ref 3.5–5.1)
Sodium: 139 mmol/L (ref 135–145)

## 2018-04-28 NOTE — Progress Notes (Addendum)
[facility], [place] 04/28/18  Subjective:   Appetite is better No significant improvement in Creatinine with fluids and solumedrol No SOB No edema  Objective:  Vital signs in last 24 hours:  Temp:  [98 F (36.7 C)-98.3 F (36.8 C)] 98 F (36.7 C) (07/27 1259) Pulse Rate:  [59-76] 76 (07/27 1259) Resp:  [16-18] 16 (07/27 1259) BP: (108-132)/(82-99) 121/82 (07/27 1259) SpO2:  [100 %] 100 % (07/27 1259)  Weight change: 3.674 kg (8 lb 1.6 oz) Filed Weights   04/26/18 0817 04/26/18 1800 04/27/18 1151  Weight: 41.3 kg (91 lb) 43.4 kg (95 lb 10.9 oz) 45 kg (99 lb 1.6 oz)    Intake/Output:    Intake/Output Summary (Last 24 hours) at 04/28/2018 1736 Last data filed at 04/28/2018 0857 Gross per 24 hour  Intake 1189 ml  Output 950 ml  Net 239 ml     Physical Exam: General: NAD, laying in bed,   HEENT Moist mucus membranes  Neck supple  Pulm/lungs Clear b/l  CVS/Heart Regular, no rub  Abdomen:  Soft NT  Extremities: No edema  Neurologic: Alert, oreinted  Skin: Left wrist cyst, tender          Basic Metabolic Panel:  Recent Labs  Lab 04/26/18 0833 04/27/18 0428 04/28/18 0649  NA 141 139 139  K 4.1 4.9 4.6  CL 112* 116* 116*  CO2 23 19* 19*  GLUCOSE 98 121* 105*  BUN 39* 37* 43*  CREATININE 3.85* 3.74* 3.82*  CALCIUM 8.3* 8.2* 7.9*  PHOS  --  3.9  --      CBC: Recent Labs  Lab 04/26/18 0833 04/27/18 0428  WBC 5.1 2.5*  HGB 8.8* 7.9*  HCT 25.8* 23.6*  MCV 94.4 95.1  PLT 123* 111*     No results found for: HEPBSAG, HEPBSAB, HEPBIGM    Microbiology:  No results found for this or any previous visit (from the past 240 hour(s)).  Coagulation Studies: No results for input(s): LABPROT, INR in the last 72 hours.  Urinalysis: Recent Labs    04/26/18 0906 04/27/18 1727  COLORURINE STRAW* YELLOW*  LABSPEC 1.009 1.012  PHURINE 6.0 6.0  GLUCOSEU NEGATIVE 50*  HGBUR NEGATIVE SMALL*  BILIRUBINUR NEGATIVE NEGATIVE   KETONESUR NEGATIVE NEGATIVE  PROTEINUR 100* >=300*  NITRITE NEGATIVE NEGATIVE  LEUKOCYTESUR TRACE* NEGATIVE      Imaging: No results found.   Medications:   . sodium chloride 75 mL/hr at 04/28/18 1722   . feeding supplement (ENSURE ENLIVE)  237 mL Oral TID BM  . heparin  5,000 Units Subcutaneous Q8H  . methylPREDNISolone (SOLU-MEDROL) injection  125 mg Intravenous Daily  . multivitamin with minerals  1 tablet Oral Daily  . traZODone  50 mg Oral QHS     Assessment/ Plan:  37 y.o. African [place] female rt kidney stone extraction 01/2017, Lupus nephritis diagnosed (10/2016) treated with iv cytoxan and then lost to follow up  1. 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 right Kidney stones 3. Lupus nephritis 4. Proteinuria  Patient was lost to follow up. She states she sid not follow up because of transportation problems Last known Creatinine 2.19/GFR 33 (09/2017) Now presents with Creatinine of 3.85/GFR 16 Systemic symptoms c/w uremia Renal ultrasound shows rt nephrolithiasis  Plan Continue iv hydration Complements C3 slightly low, C4 low limit of normal Continue Empiric iv solumedrol Electrolytes and volume status are acceptable. No acute indication of HD at present but patient became tearful when  dialysis was mentioned.  Obtain 24 hr urine for creatinine clearance  will continue to follow   LOS: [place] 7/27/20195:36 PM  [place], [place]  Note: This note was prepared with Dragon dictation. Any transcription errors are unintentional

## 2018-04-29 LAB — CREATININE CLEARANCE, URINE, 24 HOUR
COLLECTION INTERVAL-CRCL: 24 h
CREATININE 24H UR: 1080 mg/d (ref 600–1800)
CREATININE, URINE: 60 mg/dL
Creatinine Clearance: 21 mL/min — ABNORMAL LOW (ref 75–115)
Urine Total Volume-CRCL: 1800 mL

## 2018-04-29 LAB — BASIC METABOLIC PANEL
ANION GAP: 2 — AB (ref 5–15)
BUN: 49 mg/dL — ABNORMAL HIGH (ref 6–20)
CHLORIDE: 116 mmol/L — AB (ref 98–111)
CO2: 20 mmol/L — AB (ref 22–32)
CREATININE: 3.53 mg/dL — AB (ref 0.44–1.00)
Calcium: 7.9 mg/dL — ABNORMAL LOW (ref 8.9–10.3)
GFR calc Af Amer: 18 mL/min — ABNORMAL LOW (ref >60)
GFR calc non Af Amer: 16 mL/min — ABNORMAL LOW (ref >60)
Glucose, Bld: 94 mg/dL (ref 70–99)
POTASSIUM: 4.4 mmol/L (ref 3.5–5.1)
SODIUM: 138 mmol/L (ref 135–145)

## 2018-04-29 NOTE — Progress Notes (Signed)
[facility] at [place] NAME: Patricia Maldonado    MR#:  [id number]  DATE OF BIRTH:  1980/12/17  SUBJECTIVE:  CHIEF COMPLAINT:   Chief Complaint  Patient presents with  . Nausea   Patient came with generalized weakness and noted to have worsening in the renal function.  She was not taking her treatment for lupus as she could not afford it. Renal function is still almost same, nephrology started on steroids.  REVIEW OF SYSTEMS:  CONSTITUTIONAL: No fever,have fatigue or weakness.  EYES: No blurred or double vision.  EARS, NOSE, AND THROAT: No tinnitus or ear pain.  RESPIRATORY: No cough, shortness of breath, wheezing or hemoptysis.  CARDIOVASCULAR: No chest pain, orthopnea, edema.  GASTROINTESTINAL: No nausea, vomiting, diarrhea or abdominal pain.  GENITOURINARY: No dysuria, hematuria.  ENDOCRINE: No polyuria, nocturia,  HEMATOLOGY: No anemia, easy bruising or bleeding SKIN: No rash or lesion. MUSCULOSKELETAL: No joint pain or arthritis.   NEUROLOGIC: No tingling, numbness, weakness.  PSYCHIATRY: No anxiety or depression.   ROS  DRUG ALLERGIES:   Allergies  Allergen Reactions  . Penicillins Swelling    Yeast infection Has patient had a PCN reaction causing immediate rash, facial/tongue/throat swelling, SOB or lightheadedness with hypotension:No Has patient had a PCN reaction causing severe rash involving mucus membranes or skin necrosis: Yes Has patient had a PCN reaction that required hospitalization No Has patient had a PCN reaction occurring within the last 10 years: No If all of the above answers are "NO", then may proceed with Cephalosporin use.     VITALS:  Blood pressure 118/87, pulse 61, temperature 97.7 F (36.5 C), temperature source Oral, resp. rate 14, height 5\' 2"  (1.575 m), weight 45 kg (99 lb 1.6 oz), last menstrual period 04/13/2018, SpO2 100 %.  PHYSICAL EXAMINATION:   GENERAL:  37 y.o.-year-old patient lying in the  bed with no acute distress.  EYES: Pupils equal, round, reactive to light and accommodation. No scleral icterus. Extraocular muscles intact.  HEENT: Head atraumatic, normocephalic. Oropharynx and nasopharynx clear.  NECK:  Supple, no jugular venous distention. No thyroid enlargement, no tenderness.  LUNGS: Normal breath sounds bilaterally, no wheezing, rales,rhonchi or crepitation. No use of accessory muscles of respiration.  CARDIOVASCULAR: S1, S2 normal. No murmurs, rubs, or gallops.  ABDOMEN: Soft, nontender, nondistended. Bowel sounds present. No organomegaly or mass.  EXTREMITIES: No pedal edema, cyanosis, or clubbing.  NEUROLOGIC: Cranial nerves II through XII are intact. Muscle strength 5/5 in all extremities. Sensation intact. Gait not checked.  PSYCHIATRIC: The patient is alert and oriented x 3.  SKIN: No obvious rash, lesion, or ulcer.     Physical Exam LABORATORY PANEL:   CBC Recent Labs  Lab 04/27/18 0428  WBC 2.5*  HGB 7.9*  HCT 23.6*  PLT 111*   ------------------------------------------------------------------------------------------------------------------  Chemistries  Recent Labs  Lab 04/26/18 0833  04/29/18 0547  NA 141   < > 138  K 4.1   < > 4.4  CL 112*   < > 116*  CO2 23   < > 20*  GLUCOSE 98   < > 94  BUN 39*   < > 49*  CREATININE 3.85*   < > 3.53*  CALCIUM 8.3*   < > 7.9*  AST 19  --   --   ALT 11  --   --   ALKPHOS 62  --   --   BILITOT 0.4  --   --    < > =  values in this interval not displayed.   ------------------------------------------------------------------------------------------------------------------  Cardiac Enzymes No results for input(s): TROPONINI in the last 168 hours. ------------------------------------------------------------------------------------------------------------------  RADIOLOGY:  No results found.  ASSESSMENT AND PLAN:   Principal Problem:   Acute on chronic renal failure (HCC)  * Ac on ch renal  failure- stage 3   IV fluids, avoid nephrotoxics.   Nephro consult appreciated.   On steroids, still same.  * Lupus   Not on meds currently.   Nephro to help.   IV steroids.  * Anemia of chronic disease, due to renal failure   Monitor.  * Smoking- Tobacco abuse   Counseled to quit, time spent for that is 4 min.  * nausea   This may be due to renal failure   Symptomatic management with zofran  All the records are reviewed and case discussed with Care Management/Social Workerr. Management plans discussed with the patient, family and they are in agreement.  CODE STATUS: Full.  TOTAL TIME TAKING CARE OF THIS PATIENT: 35 minutes.     POSSIBLE D/C IN 1-2 DAYS, DEPENDING ON CLINICAL CONDITION.   Vaughan Basta M.D on 04/29/2018   Between 7am to 6pm - Pager - [phone]  After 6pm go to [email or web address] - password [email or web address] [facility]  Office  [phone]  CC: Primary care physician; Letta Median, MD  Note: This dictation was prepared with Dragon dictation along with smaller phrase technology. Any transcriptional errors that result from this process are unintentional.

## 2018-04-29 NOTE — Progress Notes (Signed)
[facility], [place] 04/29/18  Subjective:   Appetite is better, no further nausea or vomiting Serum creatinine slightly improved to 3.5 today Patient is in good spirits this morning  No edema  Objective:  Vital signs in last 24 hours:  Temp:  [98 F (36.7 C)-98.4 F (36.9 C)] 98.4 F (36.9 C) (07/28 0407) Pulse Rate:  [58-76] 58 (07/28 0407) Resp:  [16] 16 (07/28 0407) BP: (120-123)/(78-84) 120/78 (07/28 0407) SpO2:  [99 %-100 %] 99 % (07/28 0407)  Weight change:  Filed Weights   04/26/18 0817 04/26/18 1800 04/27/18 1151  Weight: 41.3 kg (91 lb) 43.4 kg (95 lb 10.9 oz) 45 kg (99 lb 1.6 oz)    Intake/Output:    Intake/Output Summary (Last 24 hours) at 04/29/2018 1147 Last data filed at 04/29/2018 7253 Gross per 24 hour  Intake 1805 ml  Output 1300 ml  Net 505 ml     Physical Exam: General: NAD, laying in bed,   HEENT Moist mucus membranes  Neck supple  Pulm/lungs Clear b/l  CVS/Heart Regular, no rub  Abdomen:  Soft NT  Extremities: No edema  Neurologic: Alert, oreinted  Skin: Left wrist cyst,           Basic Metabolic Panel:  Recent Labs  Lab 04/26/18 0833 04/27/18 0428 04/28/18 0649 04/29/18 0547  NA 141 139 139 138  K 4.1 4.9 4.6 4.4  CL 112* 116* 116* 116*  CO2 23 19* 19* 20*  GLUCOSE 98 121* 105* 94  BUN 39* 37* 43* 49*  CREATININE 3.85* 3.74* 3.82* 3.53*  CALCIUM 8.3* 8.2* 7.9* 7.9*  PHOS  --  3.9  --   --      CBC: Recent Labs  Lab 04/26/18 0833 04/27/18 0428  WBC 5.1 2.5*  HGB 8.8* 7.9*  HCT 25.8* 23.6*  MCV 94.4 95.1  PLT 123* 111*     No results found for: HEPBSAG, HEPBSAB, HEPBIGM    Microbiology:  No results found for this or any previous visit (from the past 240 hour(s)).  Coagulation Studies: No results for input(s): LABPROT, INR in the last 72 hours.  Urinalysis: Recent Labs    04/27/18 1727  COLORURINE YELLOW*  LABSPEC 1.012  PHURINE 6.0  GLUCOSEU 50*  HGBUR SMALL*   BILIRUBINUR NEGATIVE  KETONESUR NEGATIVE  PROTEINUR >=300*  NITRITE NEGATIVE  LEUKOCYTESUR NEGATIVE      Imaging: No results found.   Medications:   . sodium chloride 75 mL/hr at 04/28/18 1722   . feeding supplement (ENSURE ENLIVE)  237 mL Oral TID BM  . heparin  5,000 Units Subcutaneous Q8H  . methylPREDNISolone (SOLU-MEDROL) injection  125 mg Intravenous Daily  . multivitamin with minerals  1 tablet Oral Daily  . traZODone  50 mg Oral QHS     Assessment/ Plan:  37 y.o. African [place] female rt kidney stone extraction 01/2017, Lupus nephritis diagnosed (10/2016) treated with iv cytoxan and then lost to follow up  1. 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 right Kidney stones 3. Lupus nephritis 4. Proteinuria  Patient was lost to follow up. She states she sid not follow up because of transportation problems Last known Creatinine 2.19/GFR 33 (09/2017) Now presents with Creatinine of 3.85/GFR 16 Renal ultrasound shows rt nephrolithiasis  Plan Serum creatinine has improved some with iv hydration and IV steroids No further nausea or vomiting Complements C3 slightly low, C4 low limit of normal Continue Empiric iv solumedrol Electrolytes and volume  status are acceptable. No acute indication of HD at present  24 hr urine for creatinine clearance is in progress  will continue to follow   LOS: [place] 7/28/201911:47 AM  [place], [place]  Note: This note was prepared with Dragon dictation. Any transcription errors are unintentional

## 2018-04-29 NOTE — Progress Notes (Signed)
[place] at [place] NAME: Patricia Maldonado    MR#:  [id number]  DATE OF BIRTH:  09/19/1981  SUBJECTIVE:  CHIEF COMPLAINT:   Chief Complaint  Patient presents with  . Nausea   Patient came with generalized weakness and noted to have worsening in the renal function.  She was not taking her treatment for lupus as she could not afford it. Renal function is still almost same, nephrology started on steroids.  REVIEW OF SYSTEMS:  CONSTITUTIONAL: No fever,have fatigue or weakness.  EYES: No blurred or double vision.  EARS, NOSE, AND THROAT: No tinnitus or ear pain.  RESPIRATORY: No cough, shortness of breath, wheezing or hemoptysis.  CARDIOVASCULAR: No chest pain, orthopnea, edema.  GASTROINTESTINAL: No nausea, vomiting, diarrhea or abdominal pain.  GENITOURINARY: No dysuria, hematuria.  ENDOCRINE: No polyuria, nocturia,  HEMATOLOGY: No anemia, easy bruising or bleeding SKIN: No rash or lesion. MUSCULOSKELETAL: No joint pain or arthritis.   NEUROLOGIC: No tingling, numbness, weakness.  PSYCHIATRY: No anxiety or depression.   ROS  DRUG ALLERGIES:   Allergies  Allergen Reactions  . Penicillins Swelling    Yeast infection Has patient had a PCN reaction causing immediate rash, facial/tongue/throat swelling, SOB or lightheadedness with hypotension:No Has patient had a PCN reaction causing severe rash involving mucus membranes or skin necrosis: Yes Has patient had a PCN reaction that required hospitalization No Has patient had a PCN reaction occurring within the last 10 years: No If all of the above answers are "NO", then may proceed with Cephalosporin use.     VITALS:  Blood pressure 118/87, pulse 61, temperature 97.7 F (36.5 C), temperature source Oral, resp. rate 14, height 5\' 2"  (1.575 m), weight 45 kg (99 lb 1.6 oz), last menstrual period 04/13/2018, SpO2 100 %.  PHYSICAL EXAMINATION:   GENERAL:  37 y.o.-year-old patient lying in the  bed with no acute distress.  EYES: Pupils equal, round, reactive to light and accommodation. No scleral icterus. Extraocular muscles intact.  HEENT: Head atraumatic, normocephalic. Oropharynx and nasopharynx clear.  NECK:  Supple, no jugular venous distention. No thyroid enlargement, no tenderness.  LUNGS: Normal breath sounds bilaterally, no wheezing, rales,rhonchi or crepitation. No use of accessory muscles of respiration.  CARDIOVASCULAR: S1, S2 normal. No murmurs, rubs, or gallops.  ABDOMEN: Soft, nontender, nondistended. Bowel sounds present. No organomegaly or mass.  EXTREMITIES: No pedal edema, cyanosis, or clubbing.  NEUROLOGIC: Cranial nerves II through XII are intact. Muscle strength 5/5 in all extremities. Sensation intact. Gait not checked.  PSYCHIATRIC: The patient is alert and oriented x 3.  SKIN: No obvious rash, lesion, or ulcer.     Physical Exam LABORATORY PANEL:   CBC Recent Labs  Lab 04/27/18 0428  WBC 2.5*  HGB 7.9*  HCT 23.6*  PLT 111*   ------------------------------------------------------------------------------------------------------------------  Chemistries  Recent Labs  Lab 04/26/18 0833  04/29/18 0547  NA 141   < > 138  K 4.1   < > 4.4  CL 112*   < > 116*  CO2 23   < > 20*  GLUCOSE 98   < > 94  BUN 39*   < > 49*  CREATININE 3.85*   < > 3.53*  CALCIUM 8.3*   < > 7.9*  AST 19  --   --   ALT 11  --   --   ALKPHOS 62  --   --   BILITOT 0.4  --   --    < > =  values in this interval not displayed.   ------------------------------------------------------------------------------------------------------------------  Cardiac Enzymes No results for input(s): TROPONINI in the last 168 hours. ------------------------------------------------------------------------------------------------------------------  RADIOLOGY:  No results found.  ASSESSMENT AND PLAN:   Principal Problem:   Acute on chronic renal failure (HCC)  * Ac on ch renal  failure- stage 3   IV fluids, avoid nephrotoxics.   Nephro consult appreciated.   On steroids, still same.   24 hr urine creatinine per nephrology.  * Lupus   Not on meds currently.   Nephro to help.   IV steroids.  * Anemia of chronic disease, due to renal failure   Monitor.  * Smoking- Tobacco abuse   Counseled to quit, time spent for that is 4 min.  * nausea   This may be due to renal failure   Symptomatic management with zofran  All the records are reviewed and case discussed with Care Management/Social Workerr. Management plans discussed with the patient, family and they are in agreement.  CODE STATUS: Full.  TOTAL TIME TAKING CARE OF THIS PATIENT: 35 minutes.     POSSIBLE D/C IN 1-2 DAYS, DEPENDING ON CLINICAL CONDITION.   Vaughan Basta M.D on 04/29/2018   Between 7am to 6pm - Pager - [phone]  After 6pm go to [email or web address] - password [email or web address] [facility]  Office  [phone]  CC: Primary care physician; Letta Median, MD  Note: This dictation was prepared with Dragon dictation along with smaller phrase technology. Any transcriptional errors that result from this process are unintentional.

## 2018-04-30 LAB — BASIC METABOLIC PANEL
ANION GAP: 4 — AB (ref 5–15)
BUN: 49 mg/dL — ABNORMAL HIGH (ref 6–20)
CALCIUM: 8.2 mg/dL — AB (ref 8.9–10.3)
CO2: 21 mmol/L — AB (ref 22–32)
Chloride: 115 mmol/L — ABNORMAL HIGH (ref 98–111)
Creatinine, Ser: 3.53 mg/dL — ABNORMAL HIGH (ref 0.44–1.00)
GFR calc non Af Amer: 16 mL/min — ABNORMAL LOW (ref >60)
GFR, EST AFRICAN AMERICAN: 18 mL/min — AB (ref >60)
Glucose, Bld: 89 mg/dL (ref 70–99)
Potassium: 4.2 mmol/L (ref 3.5–5.1)
Sodium: 140 mmol/L (ref 135–145)

## 2018-04-30 MED ORDER — MYCOPHENOLATE MOFETIL 500 MG PO TABS: 1000.0000 mg | ORAL_TABLET | Freq: Two times a day (BID) | ORAL | 0 refills | Status: AC

## 2018-04-30 MED ORDER — ZOLPIDEM TARTRATE 5 MG PO TABS
5.0000 mg | ORAL_TABLET | Freq: Every evening | ORAL | Status: DC | PRN
  Administered 2018-04-30: 5 mg via ORAL
  Filled 2018-04-30: qty 1

## 2018-04-30 MED ORDER — TRIAMCINOLONE ACETONIDE 0.5 % EX CREA
1.0000 "application " | TOPICAL_CREAM | Freq: Two times a day (BID) | CUTANEOUS | Status: DC
  Administered 2018-04-30: 1 via TOPICAL
  Filled 2018-04-30: qty 15

## 2018-04-30 NOTE — Care Management (Signed)
Confirmed with Kristen at [facility]  They will be able to provide CellCept.  Per Cyril Mourning will be ready for pick up after 3 pm tomorrow.

## 2018-04-30 NOTE — Progress Notes (Signed)
[place] at [place] NAME: Patricia Maldonado    MR#:  [id number]  DATE OF BIRTH:  [birth year]  SUBJECTIVE:  CHIEF COMPLAINT:   Chief Complaint  Patient presents with  . Nausea   Patient's nausea has resolved. Good urine output. Creatinine has not improved.  REVIEW OF SYSTEMS:  CONSTITUTIONAL: No fever,have fatigue or weakness.  EYES: No blurred or double vision.  EARS, NOSE, AND THROAT: No tinnitus or ear pain.  RESPIRATORY: No cough, shortness of breath, wheezing or hemoptysis.  CARDIOVASCULAR: No chest pain, orthopnea, edema.  GASTROINTESTINAL: No nausea, vomiting, diarrhea or abdominal pain.  GENITOURINARY: No dysuria, hematuria.  ENDOCRINE: No polyuria, nocturia,  HEMATOLOGY: No anemia, easy bruising or bleeding SKIN: No rash or lesion. MUSCULOSKELETAL: No joint pain or arthritis.   NEUROLOGIC: No tingling, numbness, weakness.  PSYCHIATRY: No anxiety or depression.   ROS  DRUG ALLERGIES:   Allergies  Allergen Reactions  . Penicillins Swelling    Yeast infection Has patient had a PCN reaction causing immediate rash, facial/tongue/throat swelling, SOB or lightheadedness with hypotension:No Has patient had a PCN reaction causing severe rash involving mucus membranes or skin necrosis: Yes Has patient had a PCN reaction that required hospitalization No Has patient had a PCN reaction occurring within the last 10 years: No If all of the above answers are "NO", then may proceed with Cephalosporin use.     VITALS:  Blood pressure 121/79, pulse 82, temperature 98.4 F (36.9 C), temperature source Oral, resp. rate 18, height 5\' 2"  (1.575 m), weight 45 kg (99 lb 1.6 oz), last menstrual period 04/13/2018, SpO2 100 %.  PHYSICAL EXAMINATION:   GENERAL:  37 y.o.-year-old patient lying in the bed with no acute distress.  EYES: Pupils equal, round, reactive to light and accommodation. No scleral icterus. Extraocular muscles intact.  HEENT:  Head atraumatic, normocephalic. Oropharynx and nasopharynx clear.  NECK:  Supple, no jugular venous distention. No thyroid enlargement, no tenderness.  LUNGS: Normal breath sounds bilaterally, no wheezing, rales,rhonchi or crepitation. No use of accessory muscles of respiration.  CARDIOVASCULAR: S1, S2 normal. No murmurs, rubs, or gallops.  ABDOMEN: Soft, nontender, nondistended. Bowel sounds present. No organomegaly or mass.  EXTREMITIES: No pedal edema, cyanosis, or clubbing.  NEUROLOGIC: Cranial nerves II through XII are intact. Muscle strength 5/5 in all extremities. Sensation intact. Gait not checked.  PSYCHIATRIC: The patient is alert and oriented x 3.  SKIN: No obvious rash, lesion, or ulcer.   Physical Exam LABORATORY PANEL:   CBC Recent Labs  Lab 04/27/18 0428  WBC 2.5*  HGB 7.9*  HCT 23.6*  PLT 111*   ------------------------------------------------------------------------------------------------------------------  Chemistries  Recent Labs  Lab 04/26/18 0833  04/30/18 0553  NA 141   < > 140  K 4.1   < > 4.2  CL 112*   < > 115*  CO2 23   < > 21*  GLUCOSE 98   < > 89  BUN 39*   < > 49*  CREATININE 3.85*   < > 3.53*  CALCIUM 8.3*   < > 8.2*  AST 19  --   --   ALT 11  --   --   ALKPHOS 62  --   --   BILITOT 0.4  --   --    < > = values in this interval not displayed.   ------------------------------------------------------------------------------------------------------------------  Cardiac Enzymes No results for input(s): TROPONINI in the last 168 hours. ------------------------------------------------------------------------------------------------------------------  RADIOLOGY:  No results  found.  ASSESSMENT AND PLAN:   Principal Problem:   Acute on chronic renal failure (HCC)  *Acute kidney injury over CKD stage III   IV fluids, avoid nephrotoxics.   Discussed with Dr. Zollie Scale.  Will need CellCept 1000 milligrams twice daily. Discussed with case  managers.  Patient able to get medications tomorrow.   On steroids Repeat creatinine in the morning.  * Lupus  On steroids.  CellCept to start tomorrow  * Anemia of chronic disease, due to renal failure   Monitor.  * Smoking- Tobacco abuse   Counseled earlier in the admission  * Nausea due to acute kidney injury has resolved   Symptomatic management with zofran  All the records are reviewed and case discussed with Care Management/Social Workerr. Management plans discussed with the patient, family and they are in agreement.  CODE STATUS: Full.  TOTAL TIME TAKING CARE OF THIS PATIENT: 35 minutes.  POSSIBLE D/C IN 1-2 DAYS, DEPENDING ON CLINICAL CONDITION.  Neita Carp M.D on 04/30/2018   Between 7am to 6pm - Pager - [phone]  After 6pm go to [email or web address] - password [email or web address] [facility]  Office  [phone]  CC: Primary care physician; Letta Median, MD  Note: This dictation was prepared with Dragon dictation along with smaller phrase technology. Any transcriptional errors that result from this process are unintentional.

## 2018-04-30 NOTE — Progress Notes (Signed)
[place], [place] 04/30/18  Subjective:  Patient seen at bedside.  Renal function about the same today with EGFR of 18 and CR of 3.5.  Ideally patient should be on cellcept however she has only family planning medicaid.  Case disucssed with care management.    Objective:  Vital signs in last 24 hours:  Temp:  [97.7 F (36.5 C)-98.3 F (36.8 C)] 98.2 F (36.8 C) (07/29 0405) Pulse Rate:  [61-68] 68 (07/29 0405) Resp:  [14-20] 18 (07/29 0405) BP: (118-121)/(87-91) 121/90 (07/29 0405) SpO2:  [100 %] 100 % (07/29 0405)  Weight change:  Filed Weights   04/26/18 0817 04/26/18 1800 04/27/18 1151  Weight: 41.3 kg (91 lb) 43.4 kg (95 lb 10.9 oz) 45 kg (99 lb 1.6 oz)    Intake/Output:    Intake/Output Summary (Last 24 hours) at 04/30/2018 1207 Last data filed at 04/30/2018 1005 Gross per 24 hour  Intake 1266 ml  Output 1850 ml  Net -584 ml     Physical Exam: General: NAD, laying in bed  HEENT Moist mucus membranes  Neck supple  Pulm/lungs Clear b/l  CVS/Heart Regular, no rub  Abdomen:  Soft NT  Extremities: No edema  Neurologic: Alert, oriented  Skin: No rashes          Basic Metabolic Panel:  Recent Labs  Lab 04/26/18 0833 04/27/18 0428 04/28/18 0649 04/29/18 0547 04/30/18 0553  NA 141 139 139 138 140  K 4.1 4.9 4.6 4.4 4.2  CL 112* 116* 116* 116* 115*  CO2 23 19* 19* 20* 21*  GLUCOSE 98 121* 105* 94 89  BUN 39* 37* 43* 49* 49*  CREATININE 3.85* 3.74* 3.82* 3.53* 3.53*  CALCIUM 8.3* 8.2* 7.9* 7.9* 8.2*  PHOS  --  3.9  --   --   --      CBC: Recent Labs  Lab 04/26/18 0833 04/27/18 0428  WBC 5.1 2.5*  HGB 8.8* 7.9*  HCT 25.8* 23.6*  MCV 94.4 95.1  PLT 123* 111*     No results found for: HEPBSAG, HEPBSAB, HEPBIGM    Microbiology:  No results found for this or any previous visit (from the past 240 hour(s)).  Coagulation Studies: No results for input(s): LABPROT, INR in the last 72  hours.  Urinalysis: Recent Labs    04/27/18 1727  COLORURINE YELLOW*  LABSPEC 1.012  PHURINE 6.0  GLUCOSEU 50*  HGBUR SMALL*  BILIRUBINUR NEGATIVE  KETONESUR NEGATIVE  PROTEINUR >=300*  NITRITE NEGATIVE  LEUKOCYTESUR NEGATIVE      Imaging: No results found.   Medications:   . sodium chloride 75 mL/hr at 04/30/18 1034   . feeding supplement (ENSURE ENLIVE)  237 mL Oral TID BM  . heparin  5,000 Units Subcutaneous Q8H  . methylPREDNISolone (SOLU-MEDROL) injection  125 mg Intravenous Daily  . multivitamin with minerals  1 tablet Oral Daily  . traZODone  50 mg Oral QHS     Assessment/ Plan:  37 y.o. African [place] female rt kidney stone extraction 01/2017, Lupus nephritis diagnosed (10/2016) treated with iv cytoxan and then lost to follow up  1. 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 right Kidney stones 3. Lupus nephritis 4. Proteinuria  Patient was lost to follow up. She states she did not follow up because of transportation problems Last known Creatinine 2.19/GFR 33 (09/2017) Now presents with Creatinine of 3.85/GFR 18 Renal ultrasound shows rt nephrolithiasis  Plan: Ideally we would like for the patient to  be on CellCept however she has only family-planning Medicaid.  This was discussed with care management.  We will look to see what resources are available for the patient to obtain the medication.  She has right-sided nephrolithiasis that is recurrent but not causing obstruction at the moment.  Patient is at high risk for progression to end-stage renal disease and we did discuss this with the patient in depth today.    LOS: 4 Hena Ewalt 7/29/201912:07 PM  [place], [place]  Note: This note was prepared with Dragon dictation. Any transcription errors are unintentional

## 2018-05-01 LAB — BASIC METABOLIC PANEL
Anion gap: 4 — ABNORMAL LOW (ref 5–15)
BUN: 55 mg/dL — ABNORMAL HIGH (ref 6–20)
CO2: 22 mmol/L (ref 22–32)
Calcium: 8.3 mg/dL — ABNORMAL LOW (ref 8.9–10.3)
Chloride: 115 mmol/L — ABNORMAL HIGH (ref 98–111)
Creatinine, Ser: 3.47 mg/dL — ABNORMAL HIGH (ref 0.44–1.00)
GFR calc Af Amer: 19 mL/min — ABNORMAL LOW (ref >60)
GFR calc non Af Amer: 16 mL/min — ABNORMAL LOW (ref >60)
Glucose, Bld: 106 mg/dL — ABNORMAL HIGH (ref 70–99)
Potassium: 4.7 mmol/L (ref 3.5–5.1)
Sodium: 141 mmol/L (ref 135–145)

## 2018-05-01 MED ORDER — TRIAMCINOLONE ACETONIDE 0.5 % EX CREA: 1.0000 "application " | TOPICAL_CREAM | Freq: Two times a day (BID) | CUTANEOUS | 0 refills | Status: DC

## 2018-05-01 NOTE — Discharge Instructions (Addendum)
It is important you see a dermatologist if ear spots do not improve.

## 2018-05-01 NOTE — Care Management (Signed)
Patient to discharge home today.  Patient's medicaid is for family planing only.  Patient to follow up with social services.  Patient sees Therapist, sports at [facility].  Patient provided application to [facility].  Patient to pick up CellCept today from [facility] .  RNCM stressed the importance   Of following through with getting medication and completing application. Patient states that she will follow through. RNCM signing off.

## 2018-05-01 NOTE — Progress Notes (Signed)
IV was removed. Discharge instructions, follow-up appointments, and prescriptions were provided to the pt. All questions answered. The pt refused a wheelchair and walked downstairs.

## 2018-05-08 IMAGING — US US EXTREM LOW VENOUS BILAT
1 series · 13 of 24 positions shown · non-contrast
Comparison: None.

CLINICAL DATA: Bilateral leg swelling. Acute pulmonary embolism on
a recent chest CTA.



[Series 1: us extrem low venous bilat · 0.05mm/px · 13 of 60 slices shown]
[im 1/60]
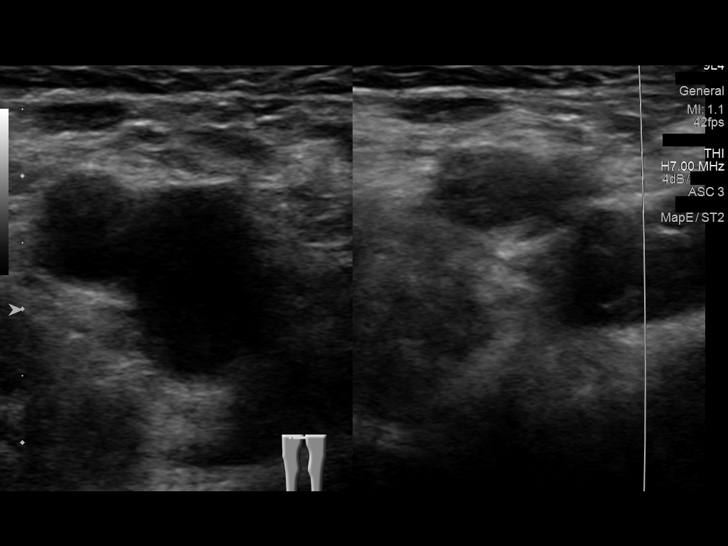
[im 6/60]
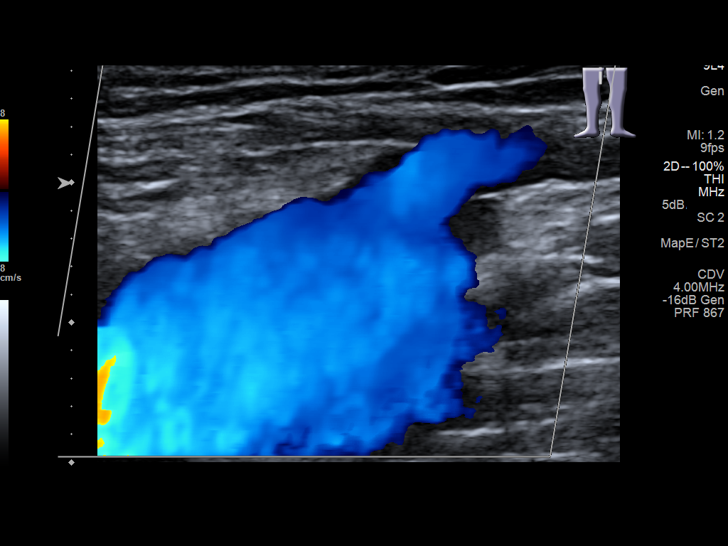
[im 11/60]
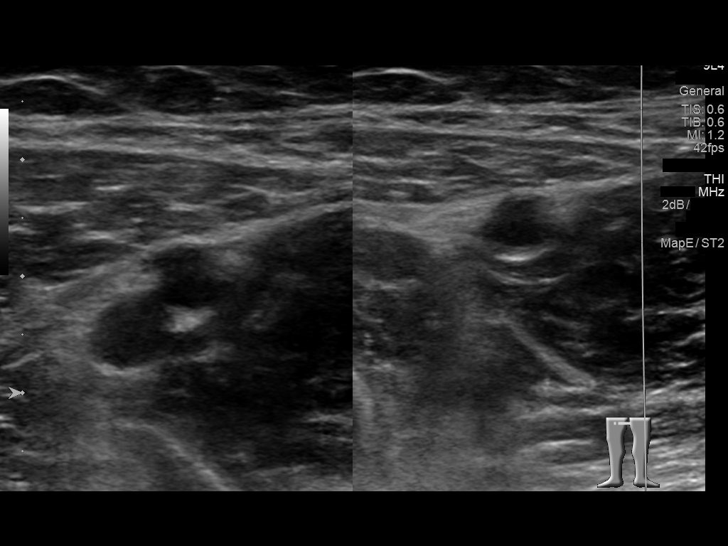
[im 16/60]
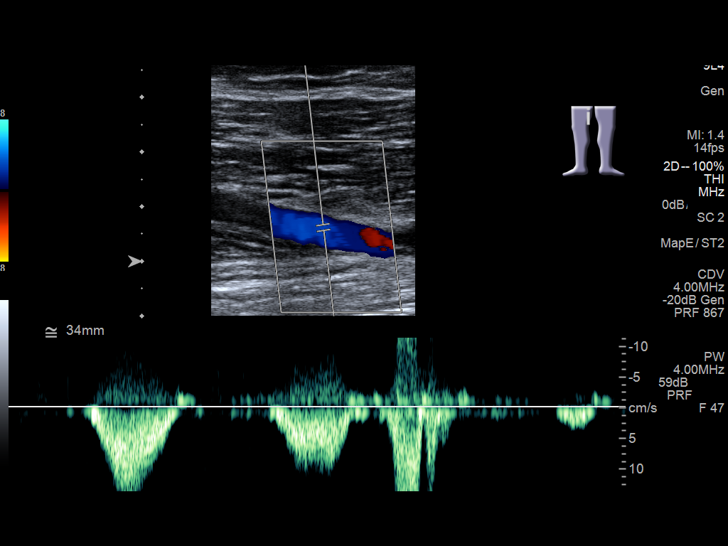
[im 21/60]
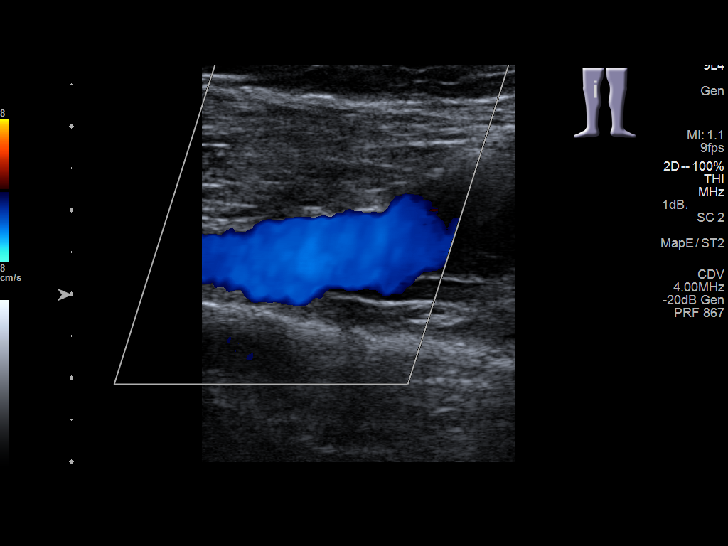
[im 26/60]
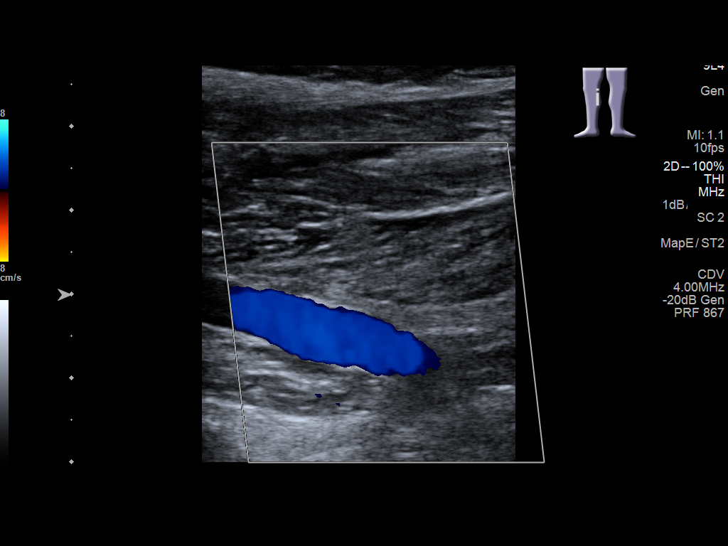
[im 31/60]
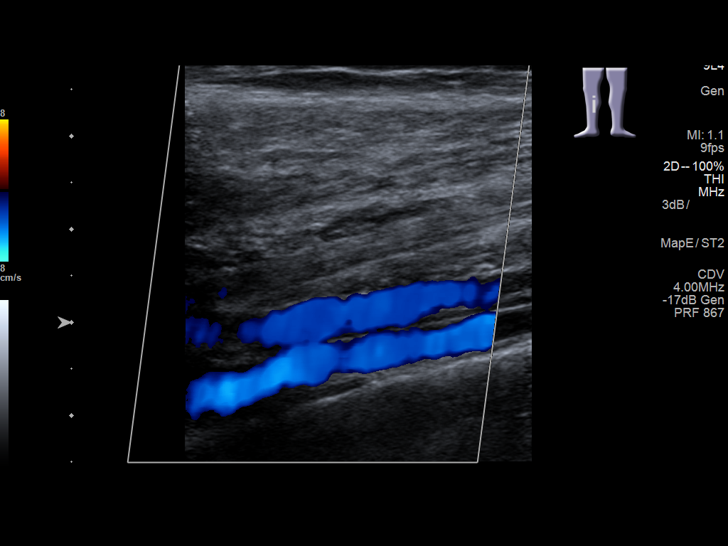
[im 34/60]
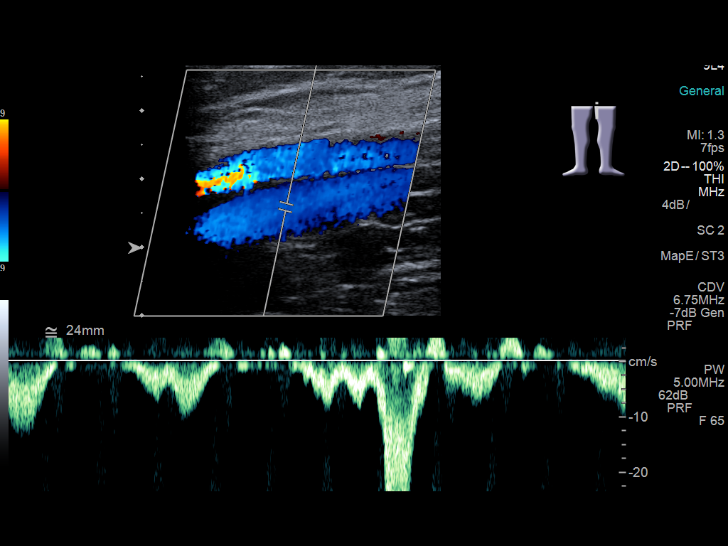
[im 39/60]
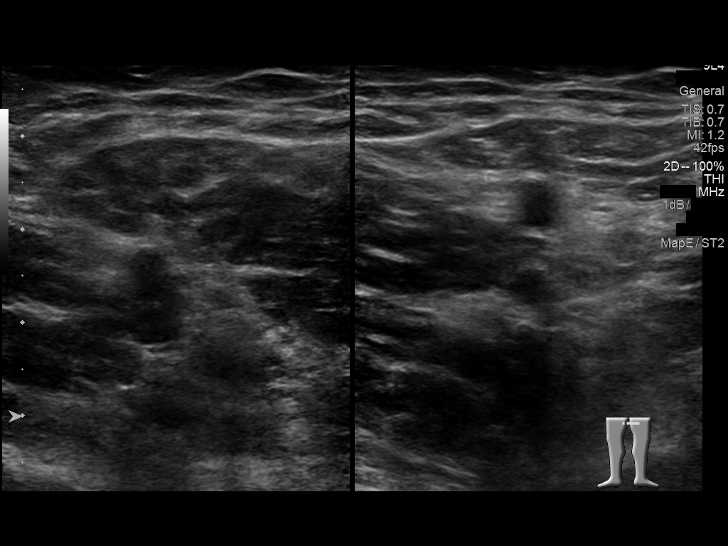
[im 44/60]
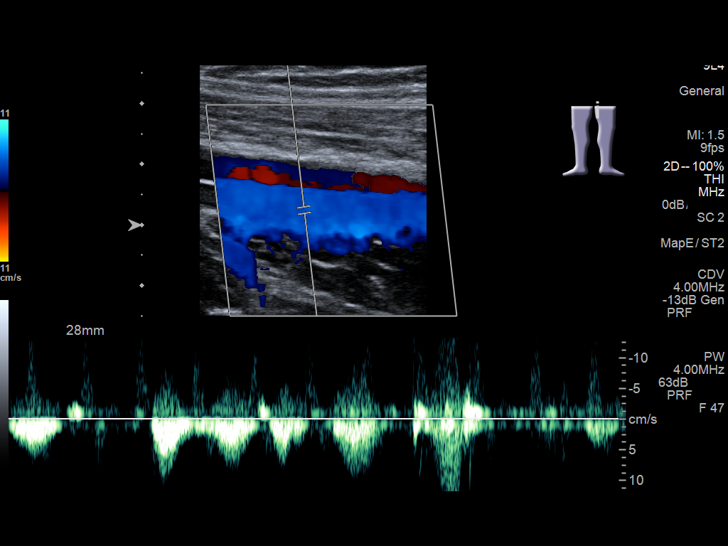
[im 49/60]
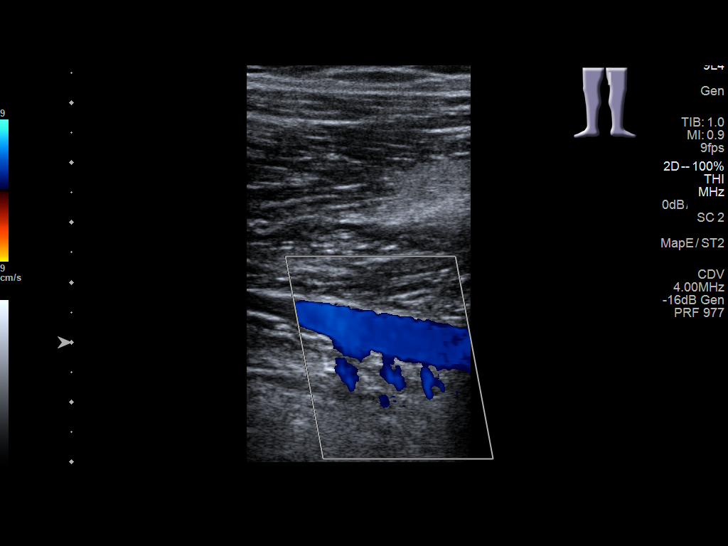
[im 54/60]
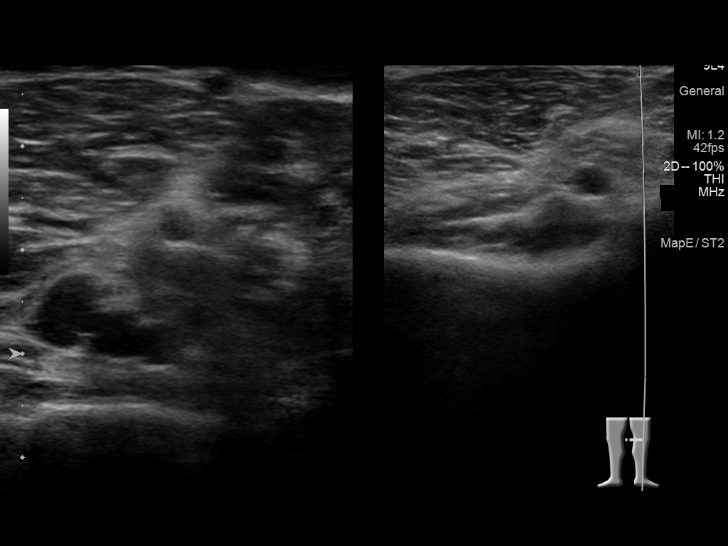
[im 60/60]
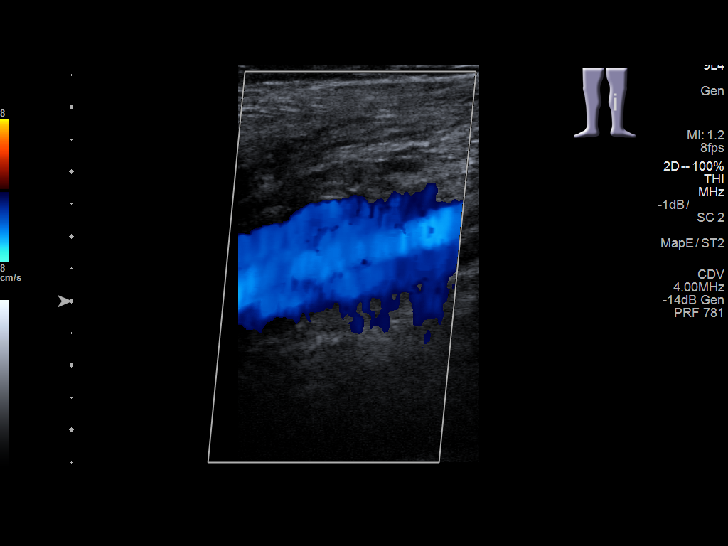

[13 of 24 positions shown; findings below may reference images not displayed]

FINDINGS: RIGHT LOWER EXTREMITY

Common Femoral Vein: No evidence of thrombus. Normal
compressibility, respiratory phasicity and response to augmentation.

Saphenofemoral Junction: No evidence of thrombus. Normal
compressibility and flow on color Doppler imaging.

Profunda Femoral Vein: No evidence of thrombus. Normal
compressibility and flow on color Doppler imaging.

Femoral Vein: No evidence of thrombus. Normal compressibility,
respiratory phasicity and response to augmentation.

Popliteal Vein: No evidence of thrombus. Normal compressibility,
respiratory phasicity and response to augmentation.

Calf Veins: No evidence of thrombus. Normal compressibility and flow
on color Doppler imaging.

Superficial Great Saphenous Vein: No evidence of thrombus. Normal
compressibility and flow on color Doppler imaging.

Venous Reflux:  None.

Other Findings:  None.

LEFT LOWER EXTREMITY

Common Femoral Vein: No evidence of thrombus. Normal
compressibility, respiratory phasicity and response to augmentation.

Saphenofemoral Junction: No evidence of thrombus. Normal
compressibility and flow on color Doppler imaging.

Profunda Femoral Vein: No evidence of thrombus. Normal
compressibility and flow on color Doppler imaging.

Femoral Vein: No evidence of thrombus. Normal compressibility,
respiratory phasicity and response to augmentation.

Popliteal Vein: No evidence of thrombus. Normal compressibility,
respiratory phasicity and response to augmentation.

Calf Veins: No evidence of thrombus. Normal compressibility and flow
on color Doppler imaging.

Superficial Great Saphenous Vein: No evidence of thrombus. Normal
compressibility and flow on color Doppler imaging.

Venous Reflux:  None.

Other Findings:  None.
IMPRESSION: Normal examination. No evidence of DVT within either lower
extremity.

## 2018-05-08 IMAGING — US US THYROID
2 series · 13 of 25 positions shown · non-contrast
Comparison: Chest CT 03/01/2017

ADDENDUM:
Characteristics of the thyroid nodule in the inferior right lobe
were reviewed for potential biopsy. The patient is currently
hospitalized with acute pulmonary embolism and is being
anticoagulated. Echogenicity of the nodule is essentially nearly
isoechoic and this would decrease the TI-RADS risk category to a TR3
lesion. At the lesion size, a TR3 lesion would warrant a 1 year
follow-up ultrasound rather than immediate biopsy. Given that the
patient is recovering from pulmonary embolism and will be
anticoagulated for at least 6 months, it would be reasonable to
perform a 1 year follow-up ultrasound on this nodule rather than
proceeding with immediate fine-needle aspiration. Ultrasound
characteristics of this nodule are not highly suspicious for
malignancy. I discussed this by telephone with Dr. Abdo Razak.
CLINICAL DATA: Incidental on CT. Right thyroid nodule seen on chest
CT.

EXAM:
THYROID ULTRASOUND
TECHNIQUE: Ultrasound examination of the thyroid gland and adjacent soft
tissues was performed.

[Series 1: us thyroid · 0.06mm/px · 12 of 62 slices shown (1 of 2)]
[im 1/62]
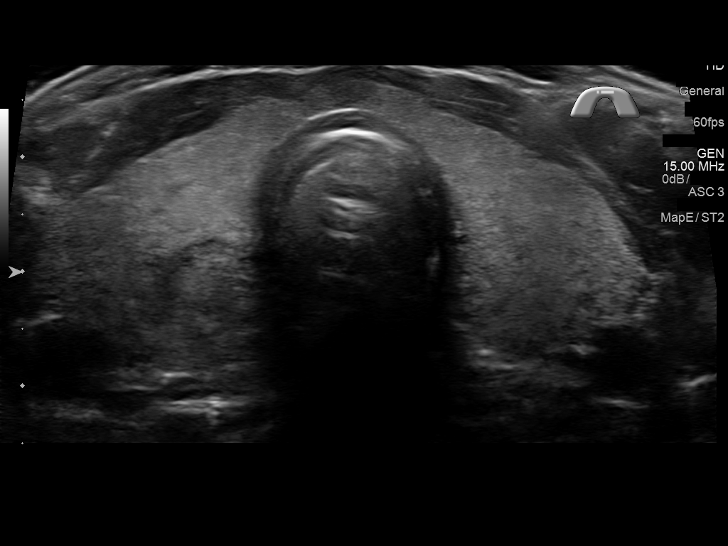
[im 6/62]
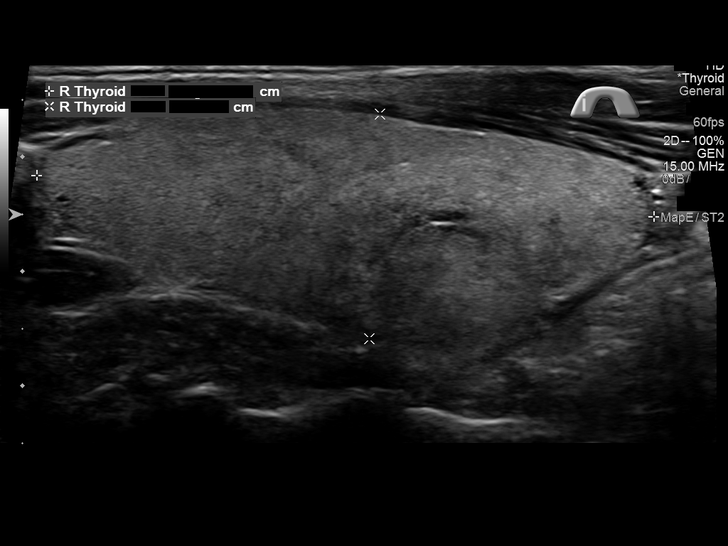
[im 11/62]
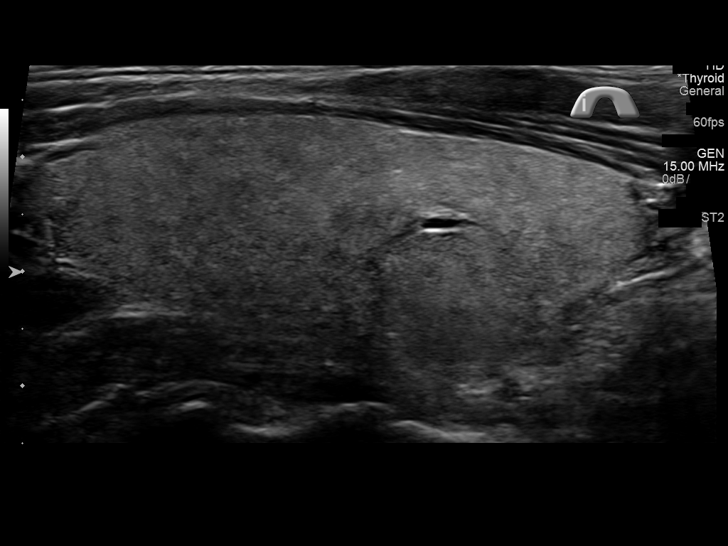
[im 16/62]
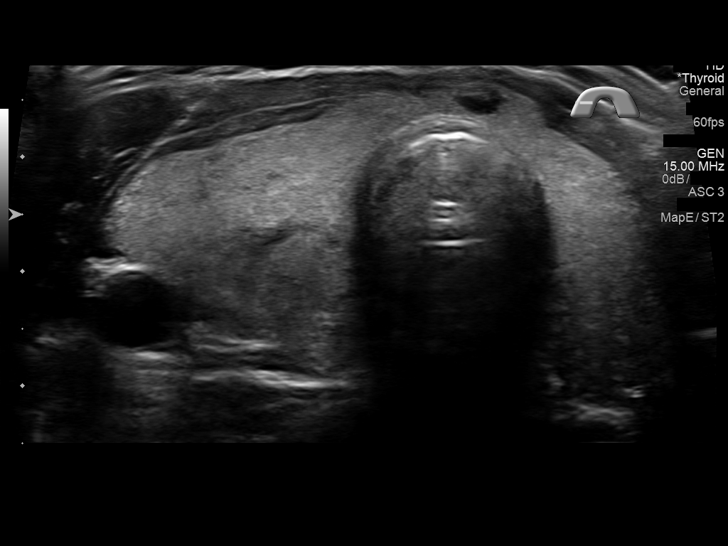
[im 22/62]
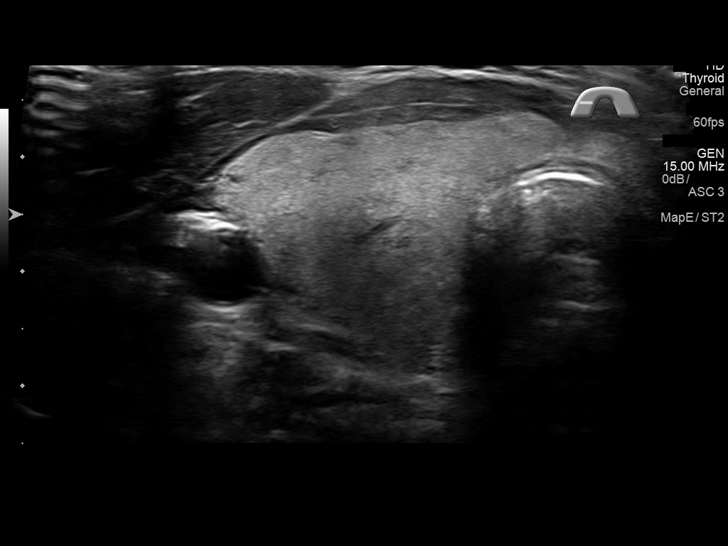
[im 27/62]
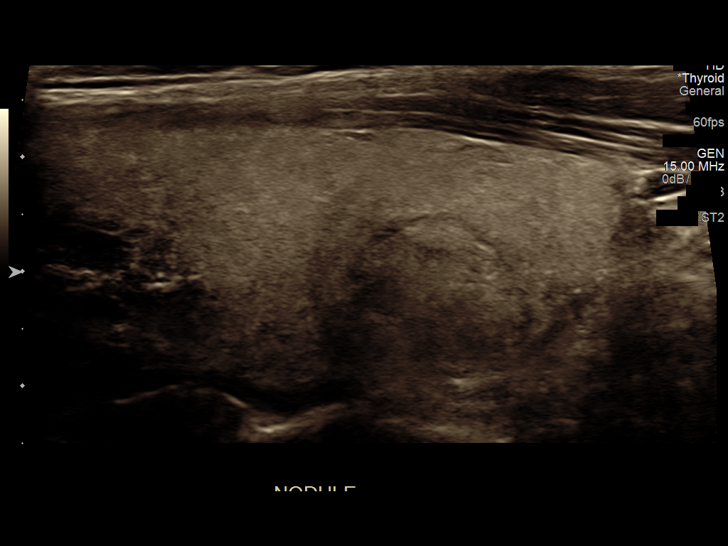
[im 32/62]
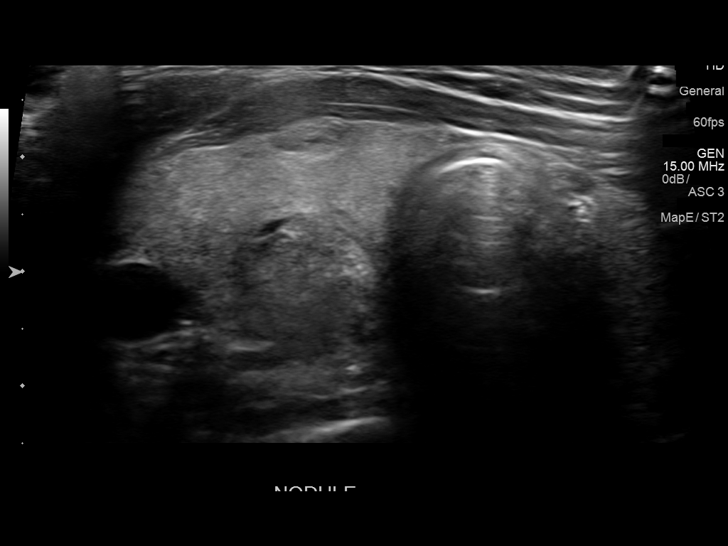
[im 38/62]
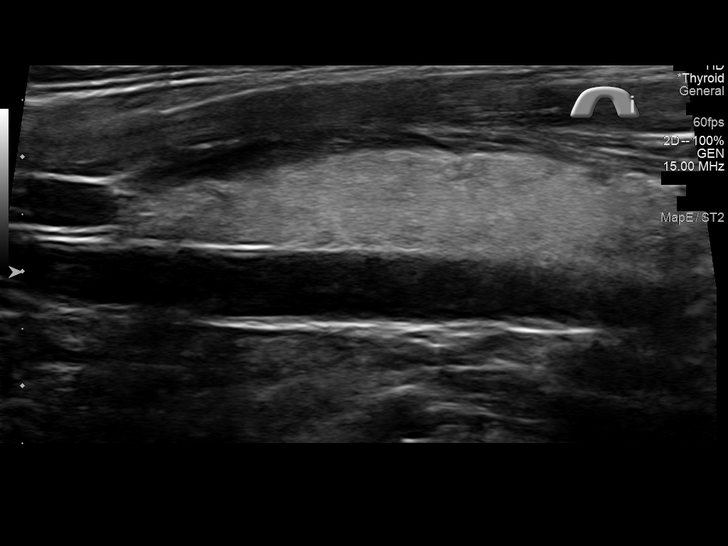
[im 43/62]
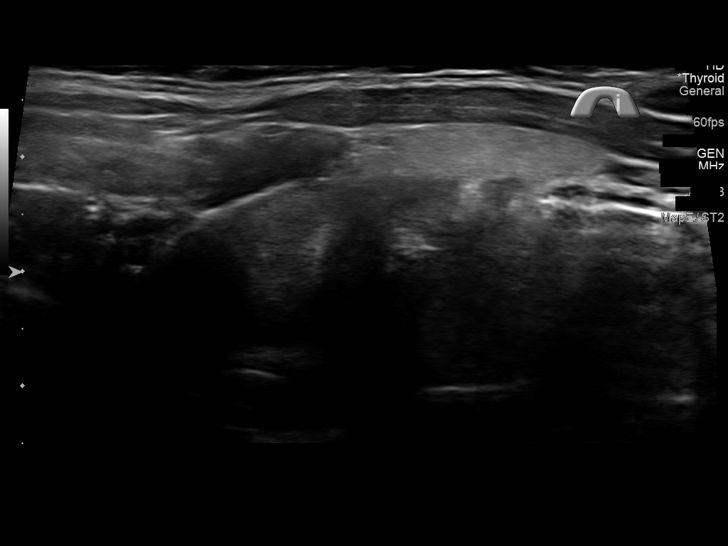
[im 48/62]
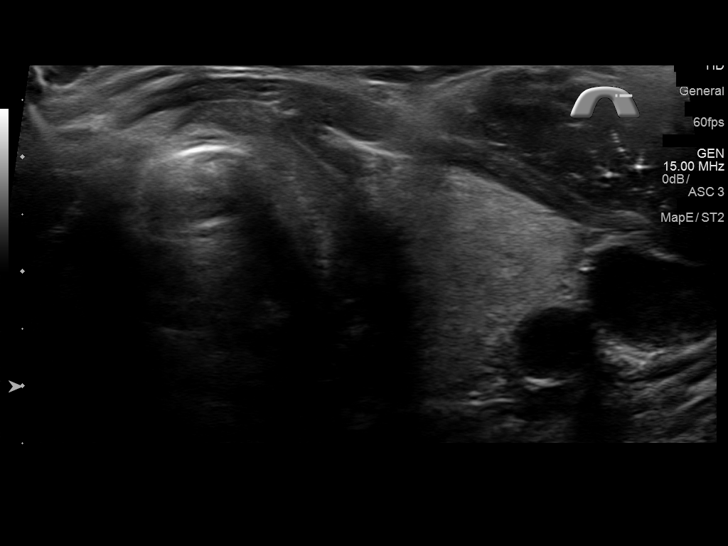
[im 54/62]
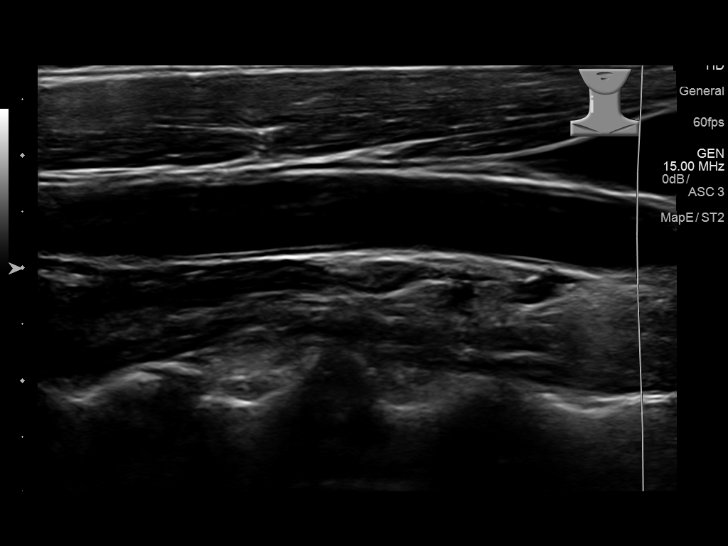
[im 59/62]
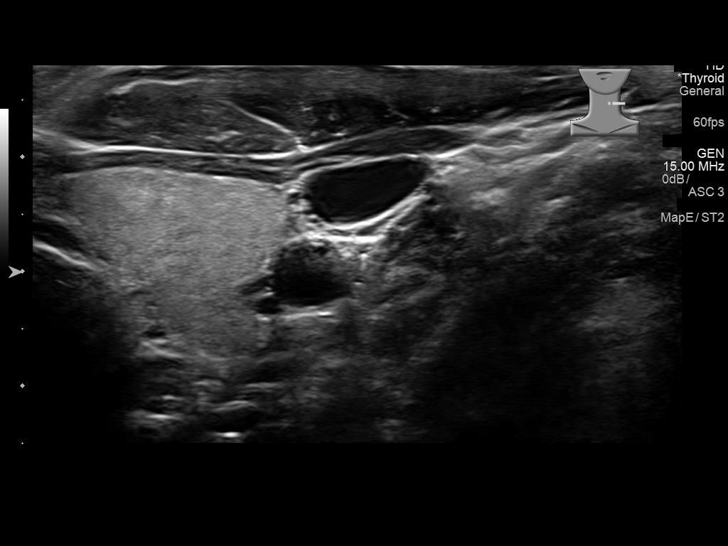

[Series 2001: us thyroid · 0.06mm/px · 1 of 2 slices shown (2 of 2)]
[im 1/2]
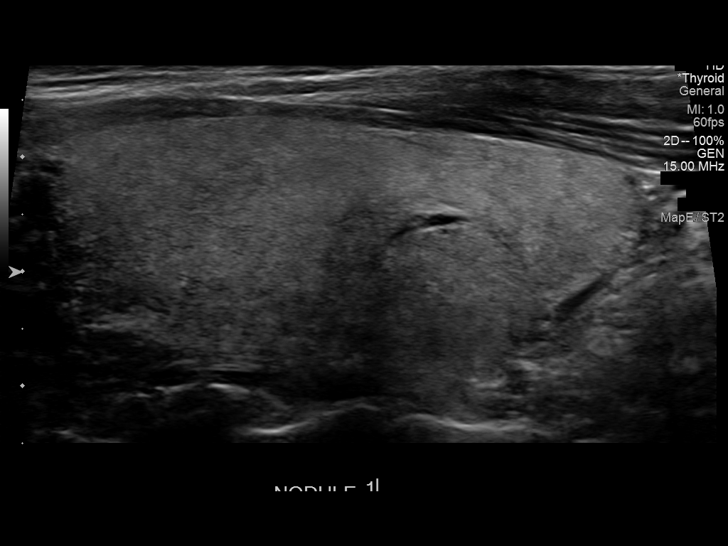

[13 of 25 positions shown; findings below may reference images not displayed]

FINDINGS: Parenchymal Echotexture: Normal

Isthmus: 0.3 cm in the AP dimension.

Right lobe: 5.4 x 2.0 x 2.2 cm.

Left lobe: 4.9 x 1.7 x 1.8 cm

_________________________________________________________

Estimated total number of nodules >/= 1 cm: 1

Number of spongiform nodules >/=  2 cm not described below (TR1): 0

Number of mixed cystic and solid nodules >/= 1.5 cm not described
below (TR2): 0

_________________________________________________________

Nodule # 1:

Location: Right; Inferior

Maximum size: 1.7 cm; Other 2 dimensions: 1.3 x 1.3 cm

Composition: solid/almost completely solid (2)

Echogenicity: hypoechoic (2)

Shape: not taller-than-wide (0)

Margins: ill-defined (0)

Echogenic foci: none (0)

ACR TI-RADS total points: 4.

ACR TI-RADS risk category: TR4 (4-6 points).

ACR TI-RADS recommendations:

**Given size (>/= 1.5 cm) and appearance, fine needle aspiration of
this moderately suspicious nodule should be considered based on
TI-RADS criteria.

_________________________________________________________

No discrete nodules in left thyroid lobe.
IMPRESSION: 1.7 cm nodule in the inferior right thyroid lobe. This corresponds
with the recent CT findings. This nodule does meet criteria for
ultrasound-guided biopsy.

The above is in keeping with the ACR TI-RADS recommendations - [HOSPITAL] 0321;[DATE].

## 2018-05-08 NOTE — Discharge Summary (Signed)
[facility] [place] NAME: Patricia Maldonado    MR#:  [id number]  DATE OF BIRTH:  1981/06/15  DATE OF ADMISSION:  04/26/2018 ADMITTING PHYSICIAN: Vaughan Basta, MD  DATE OF DISCHARGE: 05/01/2018 12:18 PM  PRIMARY CARE PHYSICIAN: Letta Median, MD   ADMISSION DIAGNOSIS:  Dehydration [E86.0] ARF (acute renal failure) ([place]) [N17.9] Acute on chronic renal insufficiency [N28.9, N18.9] Non-intractable vomiting with nausea, unspecified vomiting type [R11.2]  DISCHARGE DIAGNOSIS:  Principal Problem:   Acute on chronic renal failure ([place])   SECONDARY DIAGNOSIS:   Past Medical History:  Diagnosis Date  . Acute renal failure (ARF) ([place])   . Anemia   . Arthritis   . History of kidney stones   . Hypertension    OFF BP MEDS X 3 WEEKS DUE TO BP CONTROL  . 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 Rash   . Staghorn calculus   . 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 female with a known history of anemia, arthritis, lupus, hypertension, presents to the emergency department for nausea vomiting and diarrhea. According to the patient for the past 2 to 3 days she has had intermittent episodes of diarrhea, states the diarrhea has since resolved but she is nauseated with intermittent vomiting. Also states she has the urge to urinate, states she urinates frequently throughout the day but even after urinating she feels like she immediately needs to urinate again. Denies any dysuria. Denies any abdominal pain states it is more an urgency to urinate in the lower belly. In ER noted to have Worsening in renal func, so after talking to Nephrologist- suggest to admit for further work up. As per pt, her medicaid did not cover for " other medicine" for her lupus after she finished her prednisone ( likely Hydroxychloroquine), so  she is not taking it.    HOSPITAL COURSE:   *   Acute kidney injury over CKD stage 3 *  Lupus *  Anemia of chronic disease *  Nausea due to acute kidney injury resolved   patient was admitted to medical floor.  Started on IV fluids.  She had good urine output but creatinine did not improve significantly.  Patient was also started on steroids.  Nephrology consulted.  Patient needed immunosuppressive treatment.  Financial reasons and insurance coverage was an issue.  Patient was given prescription and supply of medications from med management clinic for CellCept a 1000 mg twice daily as recommended by Nephrology.  Creatinine has stabilized.  Patient will follow up with Nephrology as outpatient.    Stable for discharge home.  CONSULTS OBTAINED:  Treatment Team:  Murlean Iba, MD  DRUG ALLERGIES:   Allergies  Allergen Reactions  . Penicillins Swelling    Yeast infection Has patient had a PCN reaction causing immediate rash, facial/tongue/throat swelling, SOB or lightheadedness with hypotension:No Has patient had a PCN reaction causing severe rash involving mucus membranes or skin necrosis: Yes Has patient had a PCN reaction that required hospitalization No Has patient had a PCN reaction occurring within the last 10 years: No If all of the above answers are "NO", then may proceed with Cephalosporin use.     DISCHARGE MEDICATIONS:   Allergies as of 05/01/2018      Reactions   Penicillins Swelling   Yeast infection Has patient had  a PCN reaction causing immediate rash, facial/tongue/throat swelling, SOB or lightheadedness with hypotension:No Has patient had a PCN reaction causing severe rash involving mucus membranes or skin necrosis: Yes Has patient had a PCN reaction that required hospitalization No Has patient had a PCN reaction occurring within the last 10 years: No If all of the above answers are "NO", then may proceed with Cephalosporin use.      Medication List     TAKE these medications   acetaminophen 500 MG tablet Commonly known as:  TYLENOL Take 1-2 tablets (500-1,000 mg total) by mouth every 6 (six) hours as needed for mild pain.   aspirin 325 MG tablet Take 1 tablet (325 mg total) by mouth daily as needed for headache.   guaiFENesin-codeine 100-10 MG/5ML syrup Commonly known as:  ROBITUSSIN AC Take 5 mLs by mouth 3 (three) times daily as needed for cough.   hydroxychloroquine 200 MG tablet Commonly known as:  PLAQUENIL Take 1 tablet (200 mg total) by mouth daily.   mycophenolate 500 MG tablet Commonly known as:  CELLCEPT Take 2 tablets (1,000 mg total) by mouth 2 (two) times daily.   predniSONE 10 MG tablet Commonly known as:  DELTASONE Take 1 tablet (10 mg total) by mouth daily with breakfast.   triamcinolone cream 0.5 % Commonly known as:  KENALOG Apply 1 application topically 2 (two) times daily. External ear - 5 days       Today   VITAL SIGNS:  Blood pressure (!) 128/94, pulse 69, temperature 98.2 F (36.8 C), temperature source Oral, resp. rate 18, height 5\' 2"  (1.575 m), weight 45 kg (99 lb 1.6 oz), last menstrual period 04/13/2018, SpO2 100 %.  I/O:  No intake or output data in the 24 hours ending 05/08/18 1424  PHYSICAL EXAMINATION:  Physical Exam  GENERAL:  37 y.o.-year-old patient lying in the bed with no acute distress.  LUNGS: Normal breath sounds bilaterally, no wheezing, rales,rhonchi or crepitation. No use of accessory muscles of respiration.  CARDIOVASCULAR: S1, S2 normal. No murmurs, rubs, or gallops.  ABDOMEN: Soft, non-tender, non-distended. Bowel sounds present. No organomegaly or mass.  NEUROLOGIC: Moves all 4 extremities. PSYCHIATRIC: The patient is alert and oriented x 3.  SKIN: No obvious rash, lesion, or ulcer.   DATA REVIEW:   CBC No results for input(s): WBC, HGB, HCT, PLT in the last 168 hours.  Chemistries  No results for input(s): NA, K, CL, CO2, GLUCOSE, BUN, CREATININE, CALCIUM,  MG, AST, ALT, ALKPHOS, BILITOT in the last 168 hours.  Invalid input(s): GFRCGP  Cardiac Enzymes No results for input(s): TROPONINI in the last 168 hours.  Microbiology Results  Results for orders placed or performed during the hospital encounter of 03/01/17  Blood culture (routine x 2)     Status: None   Collection Time: 03/01/17 12:38 PM  Result Value Ref Range Status   Specimen Description BLOOD  LEFT FOREARM   Final   Special Requests   Final    BOTTLES DRAWN AEROBIC AND ANAEROBIC Blood Culture results may not be optimal due to an excessive volume of blood received in culture bottles   Culture NO GROWTH 5 DAYS  Final   Report Status 03/06/2017 FINAL  Final  Blood culture (routine x 2)     Status: None   Collection Time: 03/01/17 12:38 PM  Result Value Ref Range Status   Specimen Description BLOOD  RIGHT [facility]   Final   Special Requests   Final    BOTTLES DRAWN  AEROBIC AND ANAEROBIC Blood Culture adequate volume   Culture NO GROWTH 5 DAYS  Final   Report Status 03/06/2017 FINAL  Final    RADIOLOGY:  No results found.  Follow up with PCP in 1 week.  Management plans discussed with the patient, family and they are in agreement.  CODE STATUS:  Code Status History    Date Active Date Inactive Code Status Order ID Comments User Context   04/26/2018 1445 05/01/2018 1524 Full Code [id number]  Vaughan Basta, MD ED   07/27/2017 1657 07/30/2017 1850 Full Code [id number]  Bettey Costa, MD Inpatient   03/01/2017 1356 03/03/2017 1917 Full Code [id number]  Max Sane, MD Inpatient   01/23/2017 1200 01/25/2017 2151 Full Code [id number]  [name], MD Inpatient   10/04/2016 2150 10/12/2016 1504 Full Code [id number]  Henreitta Leber, MD Inpatient   08/30/2016 1913 09/04/2016 1443 Full Code [id number]  Hower, Aaron Mose, MD ED      TOTAL TIME TAKING CARE OF THIS PATIENT ON DAY OF DISCHARGE: more than 30 minutes.   Leia Alf Denita Lun M.D on 05/08/2018 at 2:24 PM  Between 7am to 6pm - Pager -  [phone]  After 6pm go to [email or web address] - password [email or web address] [facility]  Office  [phone]  CC: Primary care physician; Letta Median, MD  Note: This dictation was prepared with Dragon dictation along with smaller phrase technology. Any transcriptional errors that result from this process are unintentional.

## 2018-09-12 DIAGNOSIS — E872 Acidosis, unspecified: Secondary | ICD-10-CM | POA: Insufficient documentation

## 2018-09-12 DIAGNOSIS — N185 Chronic kidney disease, stage 5: Secondary | ICD-10-CM | POA: Insufficient documentation

## 2018-09-12 DIAGNOSIS — N2581 Secondary hyperparathyroidism of renal origin: Secondary | ICD-10-CM | POA: Insufficient documentation

## 2018-10-01 ENCOUNTER — Encounter: Payer: Self-pay | Admitting: Emergency Medicine

## 2018-10-01 ENCOUNTER — Emergency Department
Admission: EM | Admit: 2018-10-01 | Discharge: 2018-10-01 | Disposition: A | Discharge status: Home / Self Care | Payer: Medicaid Other | Attending: Emergency Medicine | Admitting: Emergency Medicine

## 2018-10-01 DIAGNOSIS — I129 Hypertensive chronic kidney disease with stage 1 through stage 4 chronic kidney disease, or unspecified chronic kidney disease: Secondary | ICD-10-CM | POA: Insufficient documentation

## 2018-10-01 DIAGNOSIS — M3210 Systemic lupus erythematosus, organ or system involvement unspecified: Secondary | ICD-10-CM | POA: Diagnosis not present

## 2018-10-01 DIAGNOSIS — Z79899 Other long term (current) drug therapy: Secondary | ICD-10-CM | POA: Insufficient documentation

## 2018-10-01 DIAGNOSIS — N183 Chronic kidney disease, stage 3 (moderate): Secondary | ICD-10-CM | POA: Diagnosis not present

## 2018-10-01 DIAGNOSIS — F17210 Nicotine dependence, cigarettes, uncomplicated: Secondary | ICD-10-CM | POA: Diagnosis not present

## 2018-10-01 DIAGNOSIS — N185 Chronic kidney disease, stage 5: Secondary | ICD-10-CM

## 2018-10-01 DIAGNOSIS — M62838 Other muscle spasm: Secondary | ICD-10-CM

## 2018-10-01 DIAGNOSIS — Z7982 Long term (current) use of aspirin: Secondary | ICD-10-CM | POA: Diagnosis not present

## 2018-10-01 DIAGNOSIS — Z86711 Personal history of pulmonary embolism: Secondary | ICD-10-CM | POA: Insufficient documentation

## 2018-10-01 LAB — CBC WITH DIFFERENTIAL/PLATELET
Abs Immature Granulocytes: 0.01 10*3/uL (ref 0.00–0.07)
Basophils Absolute: 0 10*3/uL (ref 0.0–0.1)
Basophils Relative: 0 %
Eosinophils Absolute: 0 10*3/uL (ref 0.0–0.5)
Eosinophils Relative: 0 %
HCT: 26.4 % — ABNORMAL LOW (ref 36.0–46.0)
Hemoglobin: 7.9 g/dL — ABNORMAL LOW (ref 12.0–15.0)
Immature Granulocytes: 0 %
Lymphocytes Relative: 12 %
Lymphs Abs: 0.6 10*3/uL — ABNORMAL LOW (ref 0.7–4.0)
MCH: 28.4 pg (ref 26.0–34.0)
MCHC: 29.9 g/dL — ABNORMAL LOW (ref 30.0–36.0)
MCV: 95 fL (ref 80.0–100.0)
Monocytes Absolute: 0.5 10*3/uL (ref 0.1–1.0)
Monocytes Relative: 10 %
Neutro Abs: 4.2 10*3/uL (ref 1.7–7.7)
Neutrophils Relative %: 78 %
Platelets: 184 10*3/uL (ref 150–400)
RBC: 2.78 MIL/uL — ABNORMAL LOW (ref 3.87–5.11)
RDW: 15.4 % (ref 11.5–15.5)
WBC: 5.3 10*3/uL (ref 4.0–10.5)
nRBC: 0 % (ref 0.0–0.2)

## 2018-10-01 LAB — BASIC METABOLIC PANEL
Anion gap: 11 (ref 5–15)
BUN: 76 mg/dL — ABNORMAL HIGH (ref 6–20)
CO2: 17 mmol/L — ABNORMAL LOW (ref 22–32)
Calcium: 8.3 mg/dL — ABNORMAL LOW (ref 8.9–10.3)
Chloride: 109 mmol/L (ref 98–111)
Creatinine, Ser: 7.78 mg/dL — ABNORMAL HIGH (ref 0.44–1.00)
GFR calc Af Amer: 7 mL/min — ABNORMAL LOW (ref >60)
GFR calc non Af Amer: 6 mL/min — ABNORMAL LOW (ref >60)
Glucose, Bld: 90 mg/dL (ref 70–99)
Potassium: 4.8 mmol/L (ref 3.5–5.1)
Sodium: 137 mmol/L (ref 135–145)

## 2018-10-01 MED ORDER — CYCLOBENZAPRINE HCL 5 MG PO TABS: 5.0000 mg | ORAL_TABLET | Freq: Three times a day (TID) | ORAL | 0 refills | Status: AC | PRN

## 2018-10-01 MED ORDER — SODIUM CHLORIDE 0.9 % IV BOLUS
1000.0000 mL | Freq: Once | INTRAVENOUS | Status: AC
  Administered 2018-10-01: 1000 mL via INTRAVENOUS

## 2018-10-01 NOTE — ED Triage Notes (Addendum)
Presents with muscle cramping  States it moves from hands and into legs  Hx of Lupus

## 2018-10-01 NOTE — ED Provider Notes (Signed)
-----------------------------------------   6:17 PM on 10/01/2018 -----------------------------------------  I have personally seen and evaluated the patient.  I discussed with the patient's nephrologist Dr. Percell Miller at [facility].  Patient's creatinine although elevated over baseline, currently 7.7.  Patient's creatinine was even higher on 09/14/2018.  Dr. Percell Miller is aware and states the patient has had progressive lupus nephritis.  We will IV hydrate in the emergency department but as this lab is largely unchanged over the past 2 to 3 weeks we will discharge home.  I discussed with the patient taking her home medications as prescribed and increasing oral hydration.  Patient agreeable to plan of care and will follow up with her nephrologist this week.  Overall the patient appears well, no acute distress.   Harvest Dark, MD 10/01/18 [phone]

## 2018-10-01 NOTE — ED Provider Notes (Signed)
[facility] Emergency Department Provider Note  ____________________________________________  Time seen: Approximately 3:27 PM  I have reviewed the triage vital signs and the nursing notes.   HISTORY  Chief Complaint Spasms    HPI Patricia Maldonado is a 37 y.o. female with a history of anemia, hypertension, Class III Lupus Nephritis and CKD (highest creatinine 8.2 on 09/14/18.). Patient has been under the care of [facility], Dr. Marian Sorrow. Patient  presents to the emergency department with muscle spasming of the upper and lower extremities that started this morning. Patient had several episodes of vomiting last night but no diarrhea.  Patient reports that she had similar symptoms only one other time in her life when she was initially diagnosed with lupus.  Patient reports that she has never needed to seek treatment for muscle spasms in the past.  Patient denies recent dehydration or falls/ traumas.  She denies chest pain, chest tightness and shortness of breath.  No dysuria or back pain. No alleviating measures have been attempted.    Past Medical History:  Diagnosis Date  . Acute renal failure (ARF) ([place])   . Anemia   . Arthritis   . History of kidney stones   . Hypertension    OFF BP MEDS X 3 WEEKS DUE TO BP CONTROL  . 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 Rash   . Staghorn calculus   . 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 Acute on chronic renal failure ([place]) 04/26/2018  . Acute kidney injury ([place]) 07/27/2017  . PE (pulmonary thromboembolism) ([place]) 03/01/2017  . Kidney stone on left side 01/23/2017  . Systemic lupus erythematosus ([place]) 12/08/2016  . Encounter for long-term (current) use of high-risk medication 12/08/2016  . 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 Lupus nephritis ([place]) 11/07/2016  . Acute renal failure (ARF) ([place]) 10/04/2016  .  Nephrolithiasis   . Staghorn calculus   . Acute renal failure with tubular necrosis ([place]) 08/30/2016    Past Surgical History:  Procedure Laterality Date  . CYSTOSCOPY/URETEROSCOPY/HOLMIUM LASER/STENT PLACEMENT Right 02/21/2017   Procedure: CYSTOSCOPY/URETEROSCOPY/HOLMIUM LASER/STENT EXCHANGE;  Surgeon: [name], MD;  Location: [facility] ORS;  Service: Urology;  Laterality: Right;  . IR NEPHROSTOMY PLACEMENT RIGHT  01/23/2017  . NEPHROLITHOTOMY Right 01/24/2017   Procedure: NEPHROLITHOTOMY PERCUTANEOUS;  Surgeon: [name], MD;  Location: [facility] ORS;  Service: Urology;  Laterality: Right;  . TUBAL LIGATION      Prior to Admission medications   Medication Sig Start Date End Date Taking? Authorizing Provider  acetaminophen (TYLENOL) 500 MG tablet Take 1-2 tablets (500-1,000 mg total) by mouth every 6 (six) hours as needed for mild pain. Patient not taking: Reported on 06/26/2017 10/12/16   Max Sane, MD  aspirin 325 MG tablet Take 1 tablet (325 mg total) by mouth daily as needed for headache. 07/30/17   Loletha Grayer, MD  cyclobenzaprine (FLEXERIL) 5 MG tablet Take 1 tablet (5 mg total) by mouth 3 (three) times daily as needed for up to 3 days for muscle spasms. 10/01/18 10/04/18  Vallarie Mare M, PA-C  guaiFENesin-codeine (ROBITUSSIN AC) 100-10 MG/5ML syrup Take 5 mLs by mouth 3 (three) times daily as needed for cough. Patient not taking: Reported on 04/26/2018 11/13/17   Sherrie George B, FNP  hydroxychloroquine (PLAQUENIL) 200 MG tablet Take 1 tablet (200 mg total) by mouth daily. Patient not taking: Reported on  04/26/2018 07/30/17   Loletha Grayer, MD  mycophenolate (CELLCEPT) 500 MG tablet Take 2 tablets (1,000 mg total) by mouth 2 (two) times daily. 04/30/18   Hillary Bow, MD  predniSONE (DELTASONE) 10 MG tablet Take 1 tablet (10 mg total) by mouth daily with breakfast. Patient not taking: Reported on 04/26/2018 07/31/17   Loletha Grayer, MD  triamcinolone cream (KENALOG) 0.5 %  Apply 1 application topically 2 (two) times daily. External ear - 5 days 05/01/18   Hillary Bow, MD    Allergies Penicillins  Family History  Problem Relation Age of Onset  . Thyroid disease Mother   . Thyroid disease Father   . Diabetes Father     Social History Social History   Tobacco Use  . Smoking status: Current Every Day Smoker    Packs/day: 0.25    Years: 15.00    Pack years: 3.75    Types: Cigarettes  . Smokeless tobacco: Never Used  Substance Use Topics  . Alcohol use: No  . Drug use: Yes    Types: Marijuana    Comment: OCC     Review of Systems  Constitutional: No fever/chills Eyes: No visual changes. No discharge ENT: No upper respiratory complaints. Cardiovascular: no chest pain. Respiratory: no cough. No SOB. Gastrointestinal: No abdominal pain.  No nausea, no vomiting.  No diarrhea.  No constipation. Genitourinary: Negative for dysuria. No hematuria Musculoskeletal: Patient has muscle spasms.  Skin: Negative for rash, abrasions, lacerations, ecchymosis. Neurological: Negative for headaches, focal weakness or numbness.   ____________________________________________   PHYSICAL EXAM:  VITAL SIGNS: ED Triage Vitals  Enc Vitals Group     BP 10/01/18 1433 121/81     Pulse Rate 10/01/18 1433 96     Resp --      Temp 10/01/18 1433 97.7 F (36.5 C)     Temp Source 10/01/18 1433 Oral     SpO2 10/01/18 1433 96 %     Weight 10/01/18 1435 92 lb (41.7 kg)     Height 10/01/18 1435 5\' 2"  (1.575 m)     Head Circumference --      Peak Flow --      Pain Score 10/01/18 1434 6     Pain Loc --      Pain Edu? --      Excl. in [place]? --      Constitutional: Alert and oriented. Well appearing and in no acute distress. Eyes: Conjunctivae are normal. PERRL. EOMI. Head: Atraumatic. Cardiovascular: Normal rate, regular rhythm. Normal S1 and S2.  Good peripheral circulation. Respiratory: Normal respiratory effort without tachypnea or retractions. Lungs CTAB.  Good air entry to the bases with no decreased or absent breath sounds. Gastrointestinal: Bowel sounds 4 quadrants. Soft and nontender to palpation. No guarding or rigidity. No palpable masses. No distention. No CVA tenderness. Musculoskeletal: Full range of motion to all extremities. No gross deformities appreciated. Neurologic:  Normal speech and language. No gross focal neurologic deficits are appreciated.  Skin:  Skin is warm, dry and intact. No rash noted. Psychiatric: Mood and affect are normal. Speech and behavior are normal. Patient exhibits appropriate insight and judgement.   ____________________________________________   LABS (all labs ordered are listed, but only abnormal results are displayed)  Labs Reviewed  CBC WITH DIFFERENTIAL/PLATELET - Abnormal; Notable for the following components:      Result Value   RBC 2.78 (*)    Hemoglobin 7.9 (*)    HCT 26.4 (*)    MCHC 29.9 (*)  Lymphs Abs 0.6 (*)    All other components within normal limits  BASIC METABOLIC PANEL - Abnormal; Notable for the following components:   CO2 17 (*)    BUN 76 (*)    Creatinine, Ser 7.78 (*)    Calcium 8.3 (*)    GFR calc non Af Amer 6 (*)    GFR calc Af Amer 7 (*)    All other components within normal limits   ____________________________________________  EKG   ____________________________________________  RADIOLOGY   No results found.  ____________________________________________    PROCEDURES  Procedure(s) performed:    Procedures    Medications  sodium chloride 0.9 % bolus 1,000 mL (0 mLs Intravenous Stopped 10/01/18 2012)     ____________________________________________   INITIAL IMPRESSION / ASSESSMENT AND PLAN / ED COURSE  Pertinent labs & imaging results that were available during my care of the patient were reviewed by me and considered in my medical decision making (see chart for details).  Review of the [place] CSRS was performed in accordance of the [place]  prior to dispensing any controlled drugs.    Assessment and Plan:  Chronic kidney disease Muscle spasm Patient presents to the emergency department with muscle spasms that started today.  No hypokalemia was identified on BMP.  Patient's renal function was concerning with a creatinine of 7.8 and a BUN of 76.  Patient's case was discussed with attending, Dr. Kerman Passey who contacted nephrologist at [facility], Dr. Percell Miller who advised hydration at home and follow-up with nephrology as an outpatient.  Patient was given a bolus of fluids in the emergency department and Flexeril for muscle spasms.  Hydration at home was emphasized during 2 instances of this emergency department visit.  All patient questions were answered.   ____________________________________________  FINAL CLINICAL IMPRESSION(S) / ED DIAGNOSES  Final diagnoses:  Chronic renal failure, stage 5 (HCC)  Muscle spasm      NEW MEDICATIONS STARTED DURING THIS VISIT:  ED Discharge Orders         Ordered    cyclobenzaprine (FLEXERIL) 5 MG tablet  3 times daily PRN     10/01/18 2008              This chart was dictated using voice recognition software/Dragon. Despite best efforts to proofread, errors can occur which can change the meaning. Any change was purely unintentional.    Lannie Fields, PA-C 10/01/18 2014    Lavonia Drafts, MD 10/02/18 [phone]

## 2018-11-28 ENCOUNTER — Other Ambulatory Visit (INDEPENDENT_AMBULATORY_CARE_PROVIDER_SITE_OTHER): Payer: Self-pay | Admitting: Nurse Practitioner

## 2018-11-30 ENCOUNTER — Ambulatory Visit
Admission: RE | Admit: 2018-11-30 | Discharge: 2018-11-30 | Disposition: A | Discharge status: Home / Self Care | Payer: Medicaid Other | Attending: Vascular Surgery | Admitting: Vascular Surgery

## 2018-11-30 ENCOUNTER — Other Ambulatory Visit: Payer: Self-pay

## 2018-11-30 ENCOUNTER — Encounter
Admission: RE | Disposition: A | Discharge status: Home / Self Care | Payer: Self-pay | Source: Home / Self Care | Attending: Vascular Surgery

## 2018-11-30 DIAGNOSIS — I120 Hypertensive chronic kidney disease with stage 5 chronic kidney disease or end stage renal disease: Secondary | ICD-10-CM

## 2018-11-30 DIAGNOSIS — Z880 Allergy status to penicillin: Secondary | ICD-10-CM | POA: Diagnosis not present

## 2018-11-30 DIAGNOSIS — N186 End stage renal disease: Secondary | ICD-10-CM | POA: Diagnosis not present

## 2018-11-30 DIAGNOSIS — Z936 Other artificial openings of urinary tract status: Secondary | ICD-10-CM | POA: Insufficient documentation

## 2018-11-30 DIAGNOSIS — F17210 Nicotine dependence, cigarettes, uncomplicated: Secondary | ICD-10-CM

## 2018-11-30 DIAGNOSIS — Z992 Dependence on renal dialysis: Secondary | ICD-10-CM | POA: Diagnosis not present

## 2018-11-30 DIAGNOSIS — Z833 Family history of diabetes mellitus: Secondary | ICD-10-CM | POA: Diagnosis not present

## 2018-11-30 DIAGNOSIS — M3214 Glomerular disease in systemic lupus erythematosus: Secondary | ICD-10-CM | POA: Diagnosis not present

## 2018-11-30 HISTORY — PX: DIALYSIS/PERMA CATHETER INSERTION: CATH118288

## 2018-11-30 LAB — POTASSIUM (ARMC VASCULAR LAB ONLY): POTASSIUM (ARMC VASCULAR LAB): 5.1 (ref 3.5–5.1)

## 2018-11-30 SURGERY — DIALYSIS/PERMA CATHETER INSERTION
Anesthesia: Moderate Sedation

## 2018-11-30 MED ORDER — FENTANYL CITRATE (PF) 100 MCG/2ML IJ SOLN
INTRAMUSCULAR | Status: DC | PRN
  Administered 2018-11-30 (×2): 25 ug via INTRAVENOUS
  Administered 2018-11-30: 50 ug via INTRAVENOUS

## 2018-11-30 MED ORDER — CLINDAMYCIN PHOSPHATE 300 MG/50ML IV SOLN
INTRAVENOUS | Status: AC
  Administered 2018-11-30: 300 mg via INTRAVENOUS
  Filled 2018-11-30: qty 50

## 2018-11-30 MED ORDER — MIDAZOLAM HCL 5 MG/5ML IJ SOLN
INTRAMUSCULAR | Status: AC
  Filled 2018-11-30: qty 10

## 2018-11-30 MED ORDER — MIDAZOLAM HCL 2 MG/ML PO SYRP: 8.0000 mg | ORAL_SOLUTION | Freq: Once | ORAL | Status: DC | PRN

## 2018-11-30 MED ORDER — DIPHENHYDRAMINE HCL 50 MG/ML IJ SOLN: 50.0000 mg | Freq: Once | INTRAMUSCULAR | Status: DC | PRN

## 2018-11-30 MED ORDER — SODIUM CHLORIDE 0.9 % IV SOLN: INTRAVENOUS | Status: DC

## 2018-11-30 MED ORDER — MIDAZOLAM HCL 2 MG/2ML IJ SOLN
INTRAMUSCULAR | Status: DC | PRN
  Administered 2018-11-30 (×3): 1 mg via INTRAVENOUS
  Administered 2018-11-30: 2 mg via INTRAVENOUS

## 2018-11-30 MED ORDER — LIDOCAINE-EPINEPHRINE (PF) 1 %-1:200000 IJ SOLN
INTRAMUSCULAR | Status: AC
  Filled 2018-11-30: qty 30

## 2018-11-30 MED ORDER — HYDROMORPHONE HCL 1 MG/ML IJ SOLN: 1.0000 mg | Freq: Once | INTRAMUSCULAR | Status: DC | PRN

## 2018-11-30 MED ORDER — FENTANYL CITRATE (PF) 100 MCG/2ML IJ SOLN
INTRAMUSCULAR | Status: AC
  Filled 2018-11-30: qty 4

## 2018-11-30 MED ORDER — HEPARIN (PORCINE) IN NACL 1000-0.9 UT/500ML-% IV SOLN
INTRAVENOUS | Status: AC
  Filled 2018-11-30: qty 500

## 2018-11-30 MED ORDER — METHYLPREDNISOLONE SODIUM SUCC 125 MG IJ SOLR: 125.0000 mg | Freq: Once | INTRAMUSCULAR | Status: DC | PRN

## 2018-11-30 MED ORDER — FAMOTIDINE 20 MG PO TABS: 40.0000 mg | ORAL_TABLET | Freq: Once | ORAL | Status: DC | PRN

## 2018-11-30 MED ORDER — HEPARIN SODIUM (PORCINE) 10000 UNIT/ML IJ SOLN
INTRAMUSCULAR | Status: AC
  Filled 2018-11-30: qty 1

## 2018-11-30 MED ORDER — CLINDAMYCIN PHOSPHATE 300 MG/50ML IV SOLN
300.0000 mg | Freq: Once | INTRAVENOUS | Status: AC
  Administered 2018-11-30: 300 mg via INTRAVENOUS

## 2018-11-30 MED ORDER — ONDANSETRON HCL 4 MG/2ML IJ SOLN: 4.0000 mg | Freq: Four times a day (QID) | INTRAMUSCULAR | Status: DC | PRN

## 2018-11-30 SURGICAL SUPPLY — 6 items
CATH PALINDROME RT-P 15FX19CM (CATHETERS) ×3 IMPLANT
DERMABOND ADVANCED (GAUZE/BANDAGES/DRESSINGS) ×2
DERMABOND ADVANCED .7 DNX12 (GAUZE/BANDAGES/DRESSINGS) ×1 IMPLANT
PACK ANGIOGRAPHY (CUSTOM PROCEDURE TRAY) ×3 IMPLANT
SUT MNCRL AB 4-0 PS2 18 (SUTURE) ×3 IMPLANT
SUT PROLENE 0 CT 1 30 (SUTURE) ×3 IMPLANT

## 2018-11-30 NOTE — Op Note (Signed)
OPERATIVE NOTE    PRE-OPERATIVE DIAGNOSIS: 1. ESRD   POST-OPERATIVE DIAGNOSIS: same as above  PROCEDURE: 1. Ultrasound guidance for vascular access to the right internal jugular vein 2. Fluoroscopic guidance for placement of catheter 3. Placement of a 19 cm tip to cuff tunneled hemodialysis catheter via the right internal jugular vein  SURGEON: Leotis Pain, MD  ANESTHESIA:  Local with Moderate conscious sedation for approximately 15 minutes using 5 mg of Versed and 100 mcg of Fentanyl  ESTIMATED BLOOD LOSS: 10 cc  FLUORO TIME: less than one minute  CONTRAST: none  FINDING(S): 1.  Patent right internal jugular vein  SPECIMEN(S):  None  INDICATIONS:   Patricia Maldonado is a 38 y.o.female who presents with renal failure and progressed to need dialysis.  The patient needs long term dialysis access for their ESRD, and a Permcath is necessary.  Risks and benefits are discussed and informed consent is obtained.    DESCRIPTION: After obtaining full informed written consent, the patient was brought back to the vascular suited. The patient's right neck and chest were sterilely prepped and draped in a sterile surgical field was created. Moderate conscious sedation was administered during a face to face encounter with the patient throughout the procedure with my supervision of the RN administering medicines and monitoring the patient's vital signs, pulse oximetry, telemetry and mental status throughout from the start of the procedure until the patient was taken to the recovery room.  The right internal jugular vein was visualized with ultrasound and found to be patent. It was then accessed under direct ultrasound guidance and a permanent image was recorded. A wire was placed. After skin nick and dilatation, the peel-away sheath was placed over the wire. I then turned my attention to an area under the clavicle. Approximately 1-2 fingerbreadths below the clavicle a small counterincision was created  and tunneled from the subclavicular incision to the access site. Using fluoroscopic guidance, a 19 centimeter tip to cuff tunneled hemodialysis catheter was selected, and tunneled from the subclavicular incision to the access site. It was then placed through the peel-away sheath and the peel-away sheath was removed. Using fluoroscopic guidance the catheter tips were parked in the right atrium. The appropriate distal connectors were placed. It withdrew blood well and flushed easily with heparinized saline and a concentrated heparin solution was then placed. It was secured to the chest wall with 2 Prolene sutures. The access incision was closed single 4-0 Monocryl. A 4-0 Monocryl pursestring suture was placed around the exit site. Sterile dressings were placed. The patient tolerated the procedure well and was taken to the recovery room in stable condition.  COMPLICATIONS: None  CONDITION: Stable  Leotis Pain, MD 11/30/2018 3:06 PM   This note was created with Dragon Medical transcription system. Any errors in dictation are purely unintentional.

## 2018-11-30 NOTE — H&P (Signed)
[facility] History & Physical Update  The patient was interviewed and re-examined.  The patient's previous History and Physical has been reviewed and is unchanged.  There is no change in the plan of care. We plan to proceed with the scheduled procedure.  Leotis Pain, MD  11/30/2018, 11:27 AM

## 2018-11-30 NOTE — Progress Notes (Addendum)
Patient arrived to [facility] for procedure stating that she does not know why she is here. Patient is tearful and upset, she states her cousin is in the waiting room, but she does NOT want her cousin back in [facility] with her. Patient also declined offer to page hospital chaplin while she is waiting to speak with MD.  MD paged and will come talk with patient.

## 2018-11-30 NOTE — H&P (Signed)
es [facility] Admission History & Physical  MRN : [id number]  Patricia Maldonado is a 38 y.o. (Apr 02, 1981) female who presents with chief complaint of No chief complaint on file. Marland Kitchen  History of Present Illness: Patient is referred from Dr. Candiss Norse in nephrology for dialysis access placement.  She has severe chronic kidney disease at her last GFR that I can see is less than 10.  It has been determined at this point she needs to initiate dialysis now.  Originally, she was referred for an outpatient fistula evaluation but has not been to our office for vein mapping or consultation as of yet.  She is somewhat tearful and anxious and very concerned about starting dialysis.  No fevers or chills.  Some lower extremity swelling and lethargy.  Current Facility-Administered Medications  Medication Dose Route Frequency Provider Last Rate Last Dose  . clindamycin (CLEOCIN) 300 MG/50ML IVPB           . 0.9 %  sodium chloride infusion   Intravenous Continuous Kris Hartmann, NP      . clindamycin (CLEOCIN) IVPB 300 mg  300 mg Intravenous Once Kris Hartmann, NP      . diphenhydrAMINE (BENADRYL) injection 50 mg  50 mg Intravenous Once PRN Kris Hartmann, NP      . famotidine (PEPCID) tablet 40 mg  40 mg Oral Once PRN Kris Hartmann, NP      . HYDROmorphone (DILAUDID) injection 1 mg  1 mg Intravenous Once PRN Eulogio Ditch E, NP      . methylPREDNISolone sodium succinate (SOLU-MEDROL) 125 mg/2 mL injection 125 mg  125 mg Intravenous Once PRN Eulogio Ditch E, NP      . midazolam (VERSED) 2 MG/ML syrup 8 mg  8 mg Oral Once PRN Kris Hartmann, NP      . ondansetron (ZOFRAN) injection 4 mg  4 mg Intravenous Q6H PRN Kris Hartmann, NP        Past Medical History:  Diagnosis Date  . Acute renal failure (ARF) ([place])   . Anemia   . Arthritis   . History of kidney stones   . Hypertension    OFF BP MEDS X 3 WEEKS DUE TO BP CONTROL  . 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 Rash   . Staghorn calculus   . 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[place]) 10/2016   DX DURING HOSPITAL ADMISSION    Past Surgical History:  Procedure Laterality Date  . CYSTOSCOPY/URETEROSCOPY/HOLMIUM LASER/STENT PLACEMENT Right 02/21/2017   Procedure: CYSTOSCOPY/URETEROSCOPY/HOLMIUM LASER/STENT EXCHANGE;  Surgeon: [name], MD;  Location: [facility] ORS;  Service: Urology;  Laterality: Right;  . IR NEPHROSTOMY PLACEMENT RIGHT  01/23/2017  . NEPHROLITHOTOMY Right 01/24/2017   Procedure: NEPHROLITHOTOMY PERCUTANEOUS;  Surgeon: [name], MD;  Location: [facility] ORS;  Service: Urology;  Laterality: Right;  . TUBAL LIGATION      Social History Social History   Tobacco Use  . Smoking status: Current Every Day Smoker    Packs/day: 0.05    Years: 15.00    Pack years: 0.75    Types: Cigarettes  . Smokeless tobacco: Never Used  Substance Use Topics  . Alcohol use: No  . Drug use: Yes    Types: Marijuana    Comment: OCC    Family History Family History  Problem Relation Age of Onset  . Thyroid disease Mother   . Thyroid disease Father   . Diabetes Father  Allergies  Allergen Reactions  . Penicillins Swelling    Yeast infection Has patient had a PCN reaction causing immediate rash, facial/tongue/throat swelling, SOB or lightheadedness with hypotension:No Has patient had a PCN reaction causing severe rash involving mucus membranes or skin necrosis: Yes Has patient had a PCN reaction that required hospitalization No Has patient had a PCN reaction occurring within the last 10 years: No If all of the above answers are "NO", then may proceed with Cephalosporin use.      REVIEW OF SYSTEMS (Negative unless checked)  Constitutional: [] Weight loss  [] Fever  [] Chills Cardiac: [] Chest pain   [] Chest pressure   [] Palpitations   [] Shortness of breath when laying flat   [] Shortness of breath at rest   [] Shortness of breath with exertion. Vascular:  [] Pain in legs with walking   [] Pain in  legs at rest   [] Pain in legs when laying flat   [] Claudication   [] Pain in feet when walking  [] Pain in feet at rest  [] Pain in feet when laying flat   [] History of DVT   [] Phlebitis   [x] Swelling in legs   [] Varicose veins   [] Non-healing ulcers Pulmonary:   [] Uses home oxygen   [] Productive cough   [] Hemoptysis   [] Wheeze  [] COPD   [] Asthma Neurologic:  [] Dizziness  [] Blackouts   [] Seizures   [] History of stroke   [] History of TIA  [] Aphasia   [] Temporary blindness   [] Dysphagia   [] Weakness or numbness in arms   [] Weakness or numbness in legs Musculoskeletal:  [] Arthritis   [] Joint swelling   [] Joint pain   [] Low back pain Hematologic:  [] Easy bruising  [] Easy bleeding   [] Hypercoagulable state   [x] Anemic  [] Hepatitis Gastrointestinal:  [] Blood in stool   [] Vomiting blood  [] Gastroesophageal reflux/heartburn   [] Difficulty swallowing. Genitourinary:  [x] Chronic kidney disease   [] Difficult urination  [] Frequent urination  [] Burning with urination   [] Blood in urine Skin:  [] Rashes   [] Ulcers   [] Wounds Psychological:  [] History of anxiety   []  History of major depression.  Physical Examination  Vitals:   11/30/18 1320  BP: 122/85  Pulse: (!) 104  Resp: 19  Temp: 98.1 F (36.7 C)  TempSrc: Oral  SpO2: 100%  Weight: 43.1 kg  Height: 5\' 2"  (1.575 m)   Body mass index is 17.38 kg/m. Gen: WD/WN, NAD Head: [place]/AT, No temporalis wasting.  Ear/Nose/Throat: Hearing grossly intact, nares w/o erythema or drainage, oropharynx w/o Erythema/Exudate,  Eyes: Conjunctiva clear, sclera non-icteric Neck: Trachea midline.  No JVD.  Pulmonary:  Good air movement, respirations not labored, no use of accessory muscles.  Cardiac: RRR, normal S1, S2. Vascular:  Vessel Right Left  Radial Palpable Palpable   Musculoskeletal: M/S 5/5 throughout.  Extremities without ischemic changes.  No deformity or atrophy.  Mild lower extremity swelling Neurologic: Sensation grossly intact in extremities.   Symmetrical.  Speech is fluent. Motor exam as listed above. Psychiatric: Judgment intact, Mood & affect appropriate for pt's clinical situation. Somewhat anxious Dermatologic: No rashes or ulcers noted.  No cellulitis or open wounds.      CBC Lab Results  Component Value Date   WBC 5.3 10/01/2018   HGB 7.9 (L) 10/01/2018   HCT 26.4 (L) 10/01/2018   MCV 95.0 10/01/2018   PLT 184 10/01/2018    BMET    Component Value Date/Time   NA 137 10/01/2018 1537   K 4.8 10/01/2018 1537   CL 109 10/01/2018 1537   CO2 17 (L) 10/01/2018  1537   GLUCOSE 90 10/01/2018 1537   BUN 76 (H) 10/01/2018 1537   CREATININE 7.78 (H) 10/01/2018 1537   CALCIUM 8.3 (L) 10/01/2018 1537   GFRNONAA 6 (L) 10/01/2018 1537   GFRAA 7 (L) 10/01/2018 1537   CrCl cannot be calculated (Patient's most recent lab result is older than the maximum 21 days allowed.).  COAG Lab Results  Component Value Date   INR 0.96 07/27/2017   INR 0.98 03/01/2017   INR 0.85 01/23/2017    Radiology No results found.    Assessment/Plan 1.  Renal failure.  Now felt to be end-stage renal disease.  Needs to initiate dialysis now.  Will need an outpatient work-up for fistula/graft but will need a catheter for initiation of dialysis immediately.  Risks and benefits were discussed.  She is agreeable to proceed. 2.  Lupus.  Likely an underlying cause of her renal failure.  Follows with her primary care physician for this. 3.  Hypertension.  Also an underlying cause of her renal failure and blood pressure control important in reducing the progression of atherosclerotic disease. On appropriate oral medications.    Leotis Pain, MD  11/30/2018 1:57 PM

## 2018-12-03 ENCOUNTER — Encounter: Payer: Self-pay | Admitting: Vascular Surgery

## 2018-12-13 ENCOUNTER — Other Ambulatory Visit (INDEPENDENT_AMBULATORY_CARE_PROVIDER_SITE_OTHER): Payer: Medicaid Other

## 2018-12-13 ENCOUNTER — Encounter (INDEPENDENT_AMBULATORY_CARE_PROVIDER_SITE_OTHER): Payer: Medicaid Other

## 2018-12-13 ENCOUNTER — Other Ambulatory Visit (INDEPENDENT_AMBULATORY_CARE_PROVIDER_SITE_OTHER): Payer: Self-pay | Admitting: Vascular Surgery

## 2018-12-13 ENCOUNTER — Ambulatory Visit (INDEPENDENT_AMBULATORY_CARE_PROVIDER_SITE_OTHER): Payer: Medicaid Other | Admitting: Nurse Practitioner

## 2018-12-13 DIAGNOSIS — N186 End stage renal disease: Secondary | ICD-10-CM

## 2018-12-13 DIAGNOSIS — M79601 Pain in right arm: Secondary | ICD-10-CM

## 2019-02-12 ENCOUNTER — Telehealth (INDEPENDENT_AMBULATORY_CARE_PROVIDER_SITE_OTHER): Payer: Self-pay | Admitting: Vascular Surgery

## 2019-02-14 ENCOUNTER — Other Ambulatory Visit (INDEPENDENT_AMBULATORY_CARE_PROVIDER_SITE_OTHER): Payer: Medicaid Other

## 2019-02-14 ENCOUNTER — Ambulatory Visit (INDEPENDENT_AMBULATORY_CARE_PROVIDER_SITE_OTHER): Payer: Medicaid Other | Admitting: Vascular Surgery

## 2019-02-14 ENCOUNTER — Encounter (INDEPENDENT_AMBULATORY_CARE_PROVIDER_SITE_OTHER): Payer: Medicaid Other

## 2019-02-21 DIAGNOSIS — N186 End stage renal disease: Secondary | ICD-10-CM | POA: Insufficient documentation

## 2019-02-21 DIAGNOSIS — I10 Essential (primary) hypertension: Secondary | ICD-10-CM | POA: Insufficient documentation

## 2019-03-14 ENCOUNTER — Emergency Department
Admission: EM | Admit: 2019-03-14 | Discharge: 2019-03-14 | Disposition: A | Discharge status: Home / Self Care | Payer: Medicaid Other | Attending: Emergency Medicine | Admitting: Emergency Medicine

## 2019-03-14 ENCOUNTER — Encounter: Payer: Self-pay | Admitting: Emergency Medicine

## 2019-03-14 ENCOUNTER — Other Ambulatory Visit: Payer: Self-pay

## 2019-03-14 DIAGNOSIS — T503X5A Adverse effect of electrolytic, caloric and water-balance agents, initial encounter: Secondary | ICD-10-CM | POA: Insufficient documentation

## 2019-03-14 DIAGNOSIS — I10 Essential (primary) hypertension: Secondary | ICD-10-CM | POA: Diagnosis not present

## 2019-03-14 DIAGNOSIS — F17210 Nicotine dependence, cigarettes, uncomplicated: Secondary | ICD-10-CM | POA: Insufficient documentation

## 2019-03-14 DIAGNOSIS — R42 Dizziness and giddiness: Secondary | ICD-10-CM | POA: Insufficient documentation

## 2019-03-14 DIAGNOSIS — T829XXA Unspecified complication of cardiac and vascular prosthetic device, implant and graft, initial encounter: Secondary | ICD-10-CM

## 2019-03-14 LAB — BASIC METABOLIC PANEL
Anion gap: 12 (ref 5–15)
BUN: 13 mg/dL (ref 6–20)
CO2: 30 mmol/L (ref 22–32)
Calcium: 9.2 mg/dL (ref 8.9–10.3)
Chloride: 91 mmol/L — ABNORMAL LOW (ref 98–111)
Creatinine, Ser: 5.02 mg/dL — ABNORMAL HIGH (ref 0.44–1.00)
GFR calc Af Amer: 12 mL/min — ABNORMAL LOW (ref >60)
GFR calc non Af Amer: 10 mL/min — ABNORMAL LOW (ref >60)
Glucose, Bld: 117 mg/dL — ABNORMAL HIGH (ref 70–99)
Potassium: 3.3 mmol/L — ABNORMAL LOW (ref 3.5–5.1)
Sodium: 133 mmol/L — ABNORMAL LOW (ref 135–145)

## 2019-03-14 LAB — URINALYSIS, COMPLETE (UACMP) WITH MICROSCOPIC
Bacteria, UA: NONE SEEN
RBC / HPF: >50 RBC/hpf — ABNORMAL HIGH (ref 0–5)
Specific Gravity, Urine: 1.023 (ref 1.005–1.030)

## 2019-03-14 LAB — CBC
HCT: 43 % (ref 36.0–46.0)
Hemoglobin: 13.8 g/dL (ref 12.0–15.0)
MCH: 30.9 pg (ref 26.0–34.0)
MCHC: 32.1 g/dL (ref 30.0–36.0)
MCV: 96.4 fL (ref 80.0–100.0)
Platelets: 68 10*3/uL — ABNORMAL LOW (ref 150–400)
RBC: 4.46 MIL/uL (ref 3.87–5.11)
RDW: 13.4 % (ref 11.5–15.5)
WBC: 3.7 10*3/uL — ABNORMAL LOW (ref 4.0–10.5)
nRBC: 0 % (ref 0.0–0.2)

## 2019-03-14 LAB — MAGNESIUM: Magnesium: 2 mg/dL (ref 1.7–2.4)

## 2019-03-14 LAB — POCT PREGNANCY, URINE: Preg Test, Ur: NEGATIVE

## 2019-03-14 MED ORDER — SODIUM CHLORIDE 0.9 % IV BOLUS
250.0000 mL | Freq: Once | INTRAVENOUS | Status: AC
  Administered 2019-03-14: 20:00:00 250 mL via INTRAVENOUS

## 2019-03-14 NOTE — ED Triage Notes (Signed)
Here for generalized body "cramping".  Missed two dialysis last week but has made it Tuesday and today this week.  Reports concerned about bp 975-300 systolic.  bp WNL in triage.  Unlabored.  No fevers.

## 2019-03-14 NOTE — ED Provider Notes (Signed)
[facility] Emergency Department Provider Note  ____________________________________________   I have reviewed the triage vital signs and the nursing notes. Where available I have reviewed prior notes and, if possible and indicated, outside hospital notes.    HISTORY  Chief Complaint BP fluctuations    HPI Patricia Maldonado is a 38 y.o. female patient seen and evaluated during the coronavirus epidemic during a time with low staffing presents today complaining of feeling lightheaded after her dialysis.  Patient missed dialysis a few times over the last week, however has been there for the last 2 including today.  During dialysis she has her usual cramps which she always gets during dialysis, however, they persisted longer than dialysis.  She always has cramps but today lasted longer.  She also felt a little lightheaded.  She not had much to eat or drink today which she normally does have.  She denies have any other symptoms or complaints and now she states after a wait in the waiting room, her cramping is gone and she does not feel lightheaded anymore.  She denies any fever chills nausea or vomiting diarrhea or abdominal pain.  She has no cough or shortness of breath.  She denies dysuria urinary frequency she has had a tubal ligation, she still has menstrual periods.  She does make urine occasionally.  Does not have any UTI symptoms.  Feels that maybe they took off too much fluid to compensate for her missed dialysis.    Past Medical History:  Diagnosis Date  . Acute renal failure (ARF) ([place])   . Anemia   . Arthritis   . History of kidney stones   . Hypertension    OFF BP MEDS X 3 WEEKS DUE TO BP CONTROL  . 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 Rash   . Staghorn calculus   . 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[place]) 10/2016   [place] ADMISSION    Patient Active Problem List   Diagnosis Date Noted  . Acute on chronic renal failure ([place])  04/26/2018  . Acute kidney injury ([place]) 07/27/2017  . PE (pulmonary thromboembolism) ([place]) 03/01/2017  . Kidney stone on left side 01/23/2017  . Systemic lupus erythematosus ([place]) 12/08/2016  . Encounter for long-term (current) use of high-risk medication 12/08/2016  . 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 Lupus nephritis ([place]) 11/07/2016  . Acute renal failure (ARF) ([place]) 10/04/2016  . Nephrolithiasis   . Staghorn calculus   . Acute renal failure with tubular necrosis ([place]) 08/30/2016    Past Surgical History:  Procedure Laterality Date  . CYSTOSCOPY/URETEROSCOPY/HOLMIUM LASER/STENT PLACEMENT Right 02/21/2017   Procedure: CYSTOSCOPY/URETEROSCOPY/HOLMIUM LASER/STENT EXCHANGE;  Surgeon: [name], MD;  Location: [facility] ORS;  Service: Urology;  Laterality: Right;  . DIALYSIS/PERMA CATHETER INSERTION N/A 11/30/2018   Procedure: DIALYSIS/PERMA CATHETER INSERTION;  Surgeon: Algernon Huxley, MD;  Location: [facility];  Service: Cardiovascular;  Laterality: N/A;  . IR NEPHROSTOMY PLACEMENT RIGHT  01/23/2017  . NEPHROLITHOTOMY Right 01/24/2017   Procedure: NEPHROLITHOTOMY PERCUTANEOUS;  Surgeon: [name], MD;  Location: [facility] ORS;  Service: Urology;  Laterality: Right;  . TUBAL LIGATION      Prior to Admission medications   Medication Sig Start Date End Date Taking? Authorizing Provider  acetaminophen (TYLENOL) 500 MG tablet Take 1-2 tablets (500-1,000 mg total) by mouth every 6 (six) hours as needed for mild pain. Patient not taking: Reported on 06/26/2017 10/12/16   Max Sane, MD  aspirin 325 MG tablet  Take 1 tablet (325 mg total) by mouth daily as needed for headache. Patient not taking: Reported on 11/30/2018 07/30/17   Loletha Grayer, MD  guaiFENesin-codeine [facility]) 100-10 MG/5ML syrup Take 5 mLs by mouth 3 (three) times daily as needed for cough. Patient not taking: Reported on 04/26/2018 11/13/17   Sherrie George B, FNP  hydroxychloroquine (PLAQUENIL) 200 MG tablet Take 1  tablet (200 mg total) by mouth daily. Patient not taking: Reported on 04/26/2018 07/30/17   Loletha Grayer, MD  mycophenolate (CELLCEPT) 500 MG tablet Take 2 tablets (1,000 mg total) by mouth 2 (two) times daily. 04/30/18   Hillary Bow, MD  predniSONE (DELTASONE) 10 MG tablet Take 1 tablet (10 mg total) by mouth daily with breakfast. Patient not taking: Reported on 04/26/2018 07/31/17   Loletha Grayer, MD  triamcinolone cream (KENALOG) 0.5 % Apply 1 application topically 2 (two) times daily. External ear - 5 days Patient not taking: Reported on 11/30/2018 05/01/18   Hillary Bow, MD    Allergies Penicillins  Family History  Problem Relation Age of Onset  . Thyroid disease Mother   . Thyroid disease Father   . Diabetes Father     Social History Social History   Tobacco Use  . Smoking status: Current Every Day Smoker    Packs/day: 0.05    Years: 15.00    Pack years: 0.75    Types: Cigarettes  . Smokeless tobacco: Never Used  Substance Use Topics  . Alcohol use: No  . Drug use: Yes    Types: Marijuana    Comment: OCC    Review of Systems Constitutional: No fever/chills Eyes: No visual changes. ENT: No sore throat. No stiff neck no neck pain Cardiovascular: Denies chest pain. Respiratory: Denies shortness of breath. Gastrointestinal:   no vomiting.  No diarrhea.  No constipation. Genitourinary: Negative for dysuria. Musculoskeletal: Negative lower extremity swelling Skin: Negative for rash. Neurological: Negative for severe headaches, focal weakness or numbness.   ____________________________________________   PHYSICAL EXAM:  VITAL SIGNS: ED Triage Vitals  Enc Vitals Group     BP 03/14/19 1609 106/73     Pulse Rate 03/14/19 1609 (!) 135     Resp 03/14/19 1608 14     Temp 03/14/19 1608 97.6 F (36.4 C)     Temp Source 03/14/19 1608 Oral     SpO2 03/14/19 1608 98 %     Weight 03/14/19 1608 95 lb (43.1 kg)     Height 03/14/19 1608 5\' 2"  (1.575 m)      Head Circumference --      Peak Flow --      Pain Score 03/14/19 1608 3     Pain Loc --      Pain Edu? --      Excl. in [place]? --     Constitutional: Alert and oriented. Well appearing and in no acute distress. Eyes: Conjunctivae are normal Head: Atraumatic HEENT: No congestion/rhinnorhea. Mucous membranes are moist.  Oropharynx non-erythematous Neck:   Nontender with no meningismus, no masses, no stridor Cardiovascular: Normal rate, regular rhythm. Grossly normal heart sounds.  Good peripheral circulation. Respiratory: Normal respiratory effort.  No retractions. Lungs CTAB. Abdominal: Soft and nontender. No distention. No guarding no rebound Back:  There is no focal tenderness or step off.  there is no midline tenderness there are no lesions noted. there is no CVA tenderness Musculoskeletal: No lower extremity tenderness, no upper extremity tenderness. No joint effusions, no DVT signs strong distal pulses no edema  Neurologic:  Normal speech and language. No gross focal neurologic deficits are appreciated.  Skin:  Skin is warm, dry and intact. No rash noted. Psychiatric: Mood and affect are normal. Speech and behavior are normal.  ____________________________________________   LABS (all labs ordered are listed, but only abnormal results are displayed)  Labs Reviewed  CBC - Abnormal; Notable for the following components:      Result Value   WBC 3.7 (*)    Platelets 68 (*)    All other components within normal limits  BASIC METABOLIC PANEL - Abnormal; Notable for the following components:   Sodium 133 (*)    Potassium 3.3 (*)    Chloride 91 (*)    Glucose, Bld 117 (*)    Creatinine, Ser 5.02 (*)    GFR calc non Af Amer 10 (*)    GFR calc Af Amer 12 (*)    All other components within normal limits  MAGNESIUM  URINALYSIS, COMPLETE (UACMP) WITH MICROSCOPIC  POCT PREGNANCY, URINE  CBG MONITORING, ED    Pertinent labs  results that were available during my care of the patient  were reviewed by me and considered in my medical decision making (see chart for details). ____________________________________________  EKG  I personally interpreted any EKGs ordered by me or triage  ____________________________________________  RADIOLOGY  Pertinent labs & imaging results that were available during my care of the patient were reviewed by me and considered in my medical decision making (see chart for details). If possible, patient and/or family made aware of any abnormal findings.  No results found. ____________________________________________    PROCEDURES  Procedure(s) performed: None  Procedures  Critical Care performed: None  ____________________________________________   INITIAL IMPRESSION / ASSESSMENT AND PLAN / ED COURSE  Pertinent labs & imaging results that were available during my care of the patient were reviewed by me and considered in my medical decision making (see chart for details).  Patient in no acute distress, feels much better after had time to regulate herself after dialysis, did have cramping and felt lightheaded after dialysis however, feels much better now.  We did give her a small code of IV fluid, she does make urine.  She is on her menstrual.  There is blood in her urine she declined cath urine, we have rechecked her orthostatics which were negative, blood work including magnesium is reassuring, we will discharge.  She has no complaints and would like to go home.    ____________________________________________   FINAL CLINICAL IMPRESSION(S) / ED DIAGNOSES  Final diagnoses:  None      This chart was dictated using voice recognition software.  Despite best efforts to proofread,  errors can occur which can change meaning.      Schuyler Amor, MD 03/14/19 2252

## 2019-03-14 NOTE — Discharge Instructions (Addendum)
Please talk to your kidney doctor tomorrow about how you felt after dialysis today.  If you have chest pain shortness of breath or you feel worse in any way return to the emergency department.

## 2019-03-15 ENCOUNTER — Ambulatory Visit (INDEPENDENT_AMBULATORY_CARE_PROVIDER_SITE_OTHER): Payer: Medicaid Other | Admitting: Nurse Practitioner

## 2019-03-15 ENCOUNTER — Encounter (INDEPENDENT_AMBULATORY_CARE_PROVIDER_SITE_OTHER): Payer: Medicaid Other

## 2019-03-15 ENCOUNTER — Other Ambulatory Visit: Payer: Self-pay

## 2019-03-15 ENCOUNTER — Other Ambulatory Visit (INDEPENDENT_AMBULATORY_CARE_PROVIDER_SITE_OTHER): Payer: Medicaid Other

## 2019-03-15 ENCOUNTER — Ambulatory Visit (INDEPENDENT_AMBULATORY_CARE_PROVIDER_SITE_OTHER): Payer: Medicaid Other

## 2019-03-15 ENCOUNTER — Encounter (INDEPENDENT_AMBULATORY_CARE_PROVIDER_SITE_OTHER): Payer: Self-pay | Admitting: Nurse Practitioner

## 2019-03-15 VITALS — BP 91/72 | HR 113 | Resp 12 | Ht 62.0 in | Wt 87.0 lb

## 2019-03-15 DIAGNOSIS — I120 Hypertensive chronic kidney disease with stage 5 chronic kidney disease or end stage renal disease: Secondary | ICD-10-CM | POA: Diagnosis not present

## 2019-03-15 DIAGNOSIS — N186 End stage renal disease: Secondary | ICD-10-CM

## 2019-03-15 DIAGNOSIS — Z992 Dependence on renal dialysis: Secondary | ICD-10-CM | POA: Diagnosis not present

## 2019-03-15 DIAGNOSIS — I10 Essential (primary) hypertension: Secondary | ICD-10-CM

## 2019-03-15 DIAGNOSIS — Z79899 Other long term (current) drug therapy: Secondary | ICD-10-CM

## 2019-03-15 DIAGNOSIS — F17210 Nicotine dependence, cigarettes, uncomplicated: Secondary | ICD-10-CM

## 2019-03-15 NOTE — Progress Notes (Signed)
SUBJECTIVE:  Patient ID: Patricia Maldonado, female    DOB: [birth year], 38 y.o.   MRN: [id number] Chief Complaint  Patient presents with  . New Patient (Initial Visit)    HPI  Patricia Maldonado is a 38 y.o. female The patient is seen for evaluation for dialysis access. The patient is currently on hemodialysis and is maintained via right perm cath. She has been on dialysis for three months.  There are mild uremic symptoms which appear to be relatively well tolerated at this time. The patient is right-handed.  The patient has been considering the various methods of dialysis and wishes to proceed with hemodialysis and therefore creation of AV access.  The patient denies amaurosis fugax or recent TIA symptoms. There are no recent neurological changes noted. The patient denies claudication symptoms or rest pain symptoms. The patient denies history of DVT, PE or superficial thrombophlebitis. The patient denies recent episodes of angina or shortness of breath.   The patient has adequate veins for creation of a brachial basilic, however the patient is hesitant to undergo multiple procedures, therefore the best option would be a left brachial axillary graft placement.   Past Medical History:  Diagnosis Date  . Acute renal failure (ARF) ([place])   . Anemia   . Arthritis   . History of kidney stones   . Hypertension    OFF BP MEDS X 3 WEEKS DUE TO BP CONTROL  . 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 Rash   . Staghorn calculus   . 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 CYSTOSCOPY/URETEROSCOPY/HOLMIUM LASER/STENT PLACEMENT Right 02/21/2017   Procedure: CYSTOSCOPY/URETEROSCOPY/HOLMIUM LASER/STENT EXCHANGE;  Surgeon: [name], MD;  Location: [facility];  Service: Urology;  Laterality: Right;  . DIALYSIS/PERMA CATHETER INSERTION N/A 11/30/2018   Procedure: DIALYSIS/PERMA CATHETER INSERTION;   Surgeon: Algernon Huxley, MD;  Location: [place];  Service: Cardiovascular;  Laterality: N/A;  . IR NEPHROSTOMY PLACEMENT RIGHT  01/23/2017  . NEPHROLITHOTOMY Right 01/24/2017   Procedure: NEPHROLITHOTOMY PERCUTANEOUS;  Surgeon: [name], MD;  Location: [facility];  Service: Urology;  Laterality: Right;  . TUBAL LIGATION      Social History   Socioeconomic History  . Marital status: Single    Spouse name: Not on file  . Number of children: Not on file  . Years of education: Not on file  . Highest education level: Not on file  Occupational History  . Not on file  Social Needs  . Financial resource strain: Not on file  . Food insecurity    Worry: Not on file    Inability: Not on file  . Transportation needs    Medical: Not on file    Non-medical: Not on file  Tobacco Use  . Smoking status: Current Every Day Smoker    Packs/day: 0.05    Years: 15.00    Pack years: 0.75    Types: Cigarettes  . Smokeless tobacco: Never Used  Substance and Sexual Activity  . Alcohol use: No  . Drug use: Yes    Types: Marijuana    Comment: OCC  . Sexual activity: Yes  Lifestyle  . Physical activity    Days per week: Not on file    Minutes per session: Not on file  . Stress: Not on file  Relationships  . Social Herbalist on phone: Not  on file    Gets together: Not on file    Attends religious service: Not on file    Active member of club or organization: Not on file    Attends meetings of clubs or organizations: Not on file    Relationship status: Not on file  . Intimate partner violence    Fear of current or ex partner: Not on file    Emotionally abused: Not on file    Physically abused: Not on file    Forced sexual activity: Not on file  Other Topics Concern  . Not on file  Social History Narrative  . Not on file    Family History  Problem Relation Age of Onset  . Thyroid disease Mother   . Thyroid disease Father   . Diabetes Father     Allergies   Allergen Reactions  . Penicillins Swelling    Yeast infection Has patient had a PCN reaction causing immediate rash, facial/tongue/throat swelling, SOB or lightheadedness with hypotension:No Has patient had a PCN reaction causing severe rash involving mucus membranes or skin necrosis: Yes Has patient had a PCN reaction that required hospitalization No Has patient had a PCN reaction occurring within the last 10 years: No If all of the above answers are "NO", then may proceed with Cephalosporin use.  Yeast infection Has patient had a PCN reaction causing immediate rash, facial/tongue/throat swelling, SOB or lightheadedness with hypotension:No Has patient had a PCN reaction causing severe rash involving mucus membranes or skin necrosis: Yes Has patient had a PCN reaction that required hospitalization No Has patient had a PCN reaction occurring within the last 10 years: No If all of the above answers are "NO", then may proceed with Cephalosporin use.     Review of Systems   Review of Systems: Negative Unless Checked Constitutional: [] Weight loss  [] Fever  [] Chills Cardiac: [] Chest pain   []  Atrial Fibrillation  [] Palpitations   [] Shortness of breath when laying flat   [] Shortness of breath with exertion. [] Shortness of breath at rest Vascular:  [] Pain in legs with walking   [] Pain in legs with standing [] Pain in legs when laying flat   [] Claudication    [] Pain in feet when laying flat    [] History of DVT   [] Phlebitis   [] Swelling in legs   [] Varicose veins   [] Non-healing ulcers Pulmonary:   [] Uses home oxygen   [] Productive cough   [] Hemoptysis   [] Wheeze  [] COPD   [] Asthma Neurologic:  [] Dizziness   [] Seizures  [] Blackouts [] History of stroke   [] History of TIA  [] Aphasia   [] Temporary Blindness   [] Weakness or numbness in arm   [] Weakness or numbness in leg Musculoskeletal:   [] Joint swelling   [] Joint pain   [] Low back pain  []  History of Knee Replacement [] Arthritis [] back Surgeries  []   Spinal Stenosis    Hematologic:  [] Easy bruising  [] Easy bleeding   [] Hypercoagulable state   [x] Anemic Gastrointestinal:  [] Diarrhea   [] Vomiting  [] Gastroesophageal reflux/heartburn   [] Difficulty swallowing. [] Abdominal pain Genitourinary:  [x] Chronic kidney disease   [] Difficult urination  [] Anuric   [] Blood in urine [] Frequent urination  [] Burning with urination   [] Hematuria Skin:  [] Rashes   [] Ulcers [] Wounds Psychological:  [] History of anxiety   []  History of major depression  []  Memory Difficulties      OBJECTIVE:   Physical Exam  BP 91/72 (BP Location: Left Arm, Patient Position: Sitting, Cuff Size: Small)   Pulse (!) 113   Resp 12  Ht 5\' 2"  (1.575 m)   Wt 87 lb (39.5 kg)   LMP 03/13/2019   BMI 15.91 kg/m   Gen: WD/WN, NAD Head: [place]/AT, No temporalis wasting.  Ear/Nose/Throat: Hearing grossly intact, nares w/o erythema or drainage Eyes: PER, EOMI, sclera nonicteric.  Neck: Supple, no masses.  No JVD.  Pulmonary:  Good air movement, no use of accessory muscles.  Cardiac: RRR Vascular:  Vessel Right Left  Radial Palpable Palpable   Gastrointestinal: soft, non-distended. No guarding/no peritoneal signs.  Musculoskeletal: M/S 5/5 throughout.  No deformity or atrophy.  Neurologic: Pain and light touch intact in extremities.  Symmetrical.  Speech is fluent. Motor exam as listed above. Psychiatric: Judgment intact, Mood & affect appropriate for pt's clinical situation. Dermatologic: No Venous rashes. No Ulcers Noted.  No changes consistent with cellulitis. Lymph : No Cervical lymphadenopathy, no lichenification or skin changes of chronic lymphedema.       ASSESSMENT AND PLAN:  1. ESRD (end stage renal disease) on dialysis [facility]) Recommend:  At this time the patient does not have appropriate extremity access for dialysis  Patient should have a left brachial axillary graft created.  The risks, benefits and alternative therapies were reviewed in detail with the  patient.  All questions were answered.  The patient agrees to proceed with surgery.    2. Essential hypertension Continue antihypertensive medications as already ordered, these medications have been reviewed and there are no changes at this time.    Current Outpatient Medications on File Prior to Visit  Medication Sig Dispense Refill  . B Complex-C-Folic Acid (RENA-VITE RX) 1 MG TABS TAKE 1 TABLET BY MOUTH EVERY DAY (ON DIALYSIS DAYS, TAKE AFTER DIALYSIS TREATMENT)    . mycophenolate (CELLCEPT) 500 MG tablet Take 2 tablets (1,000 mg total) by mouth 2 (two) times daily. 120 tablet 0  . RENAGEL 800 MG tablet Take 1 tablet by mouth 3 (three) times daily.    Marland Kitchen triamcinolone cream (KENALOG) 0.5 % Apply 1 application topically 2 (two) times daily. External ear - 5 days 15 g 0  . aspirin 325 MG tablet Take 1 tablet (325 mg total) by mouth daily as needed for headache. (Patient not taking: Reported on 11/30/2018) 30 tablet 0   No current facility-administered medications on file prior to visit.     There are no Patient Instructions on file for this visit. No follow-ups on file.   Kris Hartmann, NP  This note was completed with Sales executive.  Any errors are purely unintentional.

## 2019-03-27 ENCOUNTER — Telehealth: Payer: Self-pay

## 2019-03-27 NOTE — Telephone Encounter (Signed)
Called patient to ask COVID screening questions.  No answer. LMOV.

## 2019-03-29 ENCOUNTER — Telehealth: Payer: Self-pay | Admitting: Cardiovascular Disease

## 2019-03-29 NOTE — Telephone Encounter (Signed)

## 2019-03-30 NOTE — Progress Notes (Deleted)
Cardiology Office Note  Date:  03/30/2019   ID:  JEZEL BASTO, DOB 1981-04-03, MRN [id number]  PCP:  Letta Median, MD   No chief complaint on file.   HPI:   ESRD PE in 02/2019 Cytoxan infusion for her lupus nephritis on Feb 20, 2017 Lower extremity Doppler did not reveal DVT. 6 months NOAC Referred by Dr. Lucky Cowboy for cardiac clearance AV fistula on left   Echo 01/2017 Left ventricle: The cavity size was normal. Wall thickness was   normal. Systolic function was normal. The estimated ejection   fraction was in the range of 55% to 60%. Wall motion was normal;   there were no regional wall motion abnormalities. Left   ventricular diastolic function parameters were normal.   CT chest  01/2017 1. Positive for acute pulmonary embolism affecting 2 segmental branches in the right lower lobe. Right lower lobe infarct and atelectasis with trace effusion.   PMH:   has a past medical history of Acute renal failure (ARF) ([place]), Anemia, Arthritis, History of kidney stones, Hypertension, Lupus nephritis ([place]) (12/2016), Rash, Staghorn calculus, and 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[place]) (10/2016).  PSH:    Past Surgical History:  Procedure Laterality Date  . CYSTOSCOPY/URETEROSCOPY/HOLMIUM LASER/STENT PLACEMENT Right 02/21/2017   Procedure: CYSTOSCOPY/URETEROSCOPY/HOLMIUM LASER/STENT EXCHANGE;  Surgeon: [name], MD;  Location: [facility];  Service: Urology;  Laterality: Right;  . DIALYSIS/PERMA CATHETER INSERTION N/A 11/30/2018   Procedure: DIALYSIS/PERMA CATHETER INSERTION;  Surgeon: Algernon Huxley, MD;  Location: [place] [facility];  Service: Cardiovascular;  Laterality: N/A;  . IR NEPHROSTOMY PLACEMENT RIGHT  01/23/2017  . NEPHROLITHOTOMY Right 01/24/2017   Procedure: NEPHROLITHOTOMY PERCUTANEOUS;  Surgeon: [name], MD;  Location: [facility];  Service: Urology;  Laterality: Right;  . TUBAL LIGATION      Current Outpatient Medications  Medication Sig Dispense Refill  . aspirin 325 MG  tablet Take 1 tablet (325 mg total) by mouth daily as needed for headache. (Patient not taking: Reported on 11/30/2018) 30 tablet 0  . B Complex-C-Folic Acid (RENA-VITE RX) 1 MG TABS TAKE 1 TABLET BY MOUTH EVERY DAY (ON DIALYSIS DAYS, TAKE AFTER DIALYSIS TREATMENT)    . mycophenolate (CELLCEPT) 500 MG tablet Take 2 tablets (1,000 mg total) by mouth 2 (two) times daily. 120 tablet 0  . RENAGEL 800 MG tablet Take 1 tablet by mouth 3 (three) times daily.    Marland Kitchen triamcinolone cream (KENALOG) 0.5 % Apply 1 application topically 2 (two) times daily. External ear - 5 days 15 g 0   No current facility-administered medications for this visit.      Allergies:   Penicillins   Social History:  The patient  reports that she has been smoking cigarettes. She has a 0.75 pack-year smoking history. She has never used smokeless tobacco. She reports current drug use. Drug: Marijuana. She reports that she does not drink alcohol.   Family History:   family history includes Diabetes in her father; Thyroid disease in her father and mother.    Review of Systems: ROS   PHYSICAL EXAM: VS:  LMP 03/13/2019  , BMI There is no height or weight on file to calculate BMI. GEN: Well nourished, well developed, in no acute distress  HEENT: normal  Neck: no JVD, carotid bruits, or masses Cardiac: RRR; no murmurs, rubs, or gallops,no edema  Respiratory:  clear to auscultation bilaterally, normal work of breathing GI: soft, nontender, nondistended, + BS MS: no deformity or atrophy  Skin: warm and dry, no rash Neuro:  Strength and sensation are intact Psych: euthymic mood, full affect    Recent Labs: 04/26/2018: ALT 11 03/14/2019: BUN 13; Creatinine, Ser 5.02; Hemoglobin 13.8; Magnesium 2.0; Platelets 68; Potassium 3.3; Sodium 133    Lipid Panel No results found for: CHOL, HDL, LDLCALC, TRIG    Wt Readings from Last 3 Encounters:  03/15/19 87 lb (39.5 kg)  03/14/19 95 lb (43.1 kg)  11/30/18 95 lb (43.1 kg)        ASSESSMENT AND PLAN:  No diagnosis found.   Disposition:   F/U  6 months  No orders of the defined types were placed in this encounter.    Signed, Esmond Plants, M.D., Ph.D. 03/30/2019  [place], [place]

## 2019-04-01 ENCOUNTER — Ambulatory Visit: Payer: Medicaid Other | Admitting: Cardiovascular Disease

## 2019-04-09 ENCOUNTER — Telehealth: Payer: Self-pay | Admitting: Oncology

## 2019-04-09 NOTE — Telephone Encounter (Signed)
Left pt a VM to contact office to schedule and appt based on referral received from [facility] kidney.

## 2019-04-10 ENCOUNTER — Telehealth (INDEPENDENT_AMBULATORY_CARE_PROVIDER_SITE_OTHER): Payer: Self-pay

## 2019-04-10 NOTE — Telephone Encounter (Signed)
From: Algernon Huxley, MD Sent: 04/09/2019   5:08 PM EDT To: Devona Konig, CMA Subject: FW: Referral                                     ----- Message ----- From: Clarisse Gouge Sent: 04/09/2019   4:56 PM EDT To: Algernon Huxley, MD Subject: Referral                                       Patient no show for clearance appt. Mailing letter. Closing referral.          . Type Date User Summary Attachment  General 04/09/2019 4:57 PM Chari Manning: Referral message -  Note   ----- Message ----- From: Clarisse Gouge Sent: 04/09/2019   4:56 PM EDT To: Algernon Huxley, MD Subject: Referral                                       Patient no show for clearance appt. Mailing letter. Closing referral.          This referral was explained to the patient as to why she needed to be seen for cardiac clearance.

## 2019-04-28 DIAGNOSIS — D649 Anemia, unspecified: Secondary | ICD-10-CM | POA: Insufficient documentation

## 2019-04-28 DIAGNOSIS — D631 Anemia in chronic kidney disease: Secondary | ICD-10-CM | POA: Insufficient documentation

## 2019-04-28 DIAGNOSIS — N186 End stage renal disease: Secondary | ICD-10-CM | POA: Insufficient documentation

## 2019-04-28 NOTE — Progress Notes (Deleted)
[place]  [phone] [phone]  ID: Patricia Maldonado OB: 09-23-1981  MR#: [id number]  TMA#:[id number]  Patient Care Team: Letta Median, MD as PCP - [facility]) [facility], [place] ([facility])  CHIEF COMPLAINT: Lupus nephritis.  INTERVAL HISTORY: Patient returns to clinic today for further evaluation and cycle 6 of monthly Cytoxan. She currnetly feels well and is asymptomatic. She has no neurologic complaints. She denies any recent fevers or illnesses. She has a good appetite and denies weight loss. She has no chest pain, shortness of breath, cough, or hemoptysis.. She denies any nausea, vomiting, constipation, or diarrhea. She has no urinary complaints. Patient offers no specific complaints today.  REVIEW OF SYSTEMS:   Review of Systems  Constitutional: Negative.  Negative for fever, malaise/fatigue and weight loss.  Respiratory: Negative for cough and shortness of breath.   Cardiovascular: Negative.  Negative for chest pain and leg swelling.  Gastrointestinal: Negative.  Negative for abdominal pain.  Genitourinary: Negative.   Musculoskeletal: Negative.   Skin: Negative.  Negative for rash.  Neurological: Negative for weakness.  Psychiatric/Behavioral: Positive for depression. The patient is not nervous/anxious.     As per HPI. Otherwise, a complete review of systems is negative.  PAST MEDICAL HISTORY: Past Medical History:  Diagnosis Date  . Acute renal failure (ARF) ([place])   . Anemia   . Arthritis   . History of kidney stones   . Hypertension    OFF BP MEDS X 3 WEEKS DUE TO BP CONTROL  . 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 Rash   . Staghorn calculus   . 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[place]) 10/2016   DX DURING HOSPITAL ADMISSION    PAST SURGICAL HISTORY: Past Surgical History:  Procedure Laterality Date  . CYSTOSCOPY/URETEROSCOPY/HOLMIUM LASER/STENT PLACEMENT Right  02/21/2017   Procedure: CYSTOSCOPY/URETEROSCOPY/HOLMIUM LASER/STENT EXCHANGE;  Surgeon: [name], MD;  Location: [facility];  Service: Urology;  Laterality: Right;  . DIALYSIS/PERMA CATHETER INSERTION N/A 11/30/2018   Procedure: DIALYSIS/PERMA CATHETER INSERTION;  Surgeon: Algernon Huxley, MD;  Location: [place] [facility];  Service: Cardiovascular;  Laterality: N/A;  . IR NEPHROSTOMY PLACEMENT RIGHT  01/23/2017  . NEPHROLITHOTOMY Right 01/24/2017   Procedure: NEPHROLITHOTOMY PERCUTANEOUS;  Surgeon: [name], MD;  Location: [facility];  Service: Urology;  Laterality: Right;  . TUBAL LIGATION      FAMILY HISTORY: Family History  Problem Relation Age of Onset  . Thyroid disease Mother   . Thyroid disease Father   . Diabetes Father     ADVANCED DIRECTIVES (Y/N):  N  HEALTH MAINTENANCE: Social History   Tobacco Use  . Smoking status: Current Every Day Smoker    Packs/day: 0.05    Years: 15.00    Pack years: 0.75    Types: Cigarettes  . Smokeless tobacco: Never Used  Substance Use Topics  . Alcohol use: No  . Drug use: Yes    Types: Marijuana    Comment: OCC     Colonoscopy:  PAP:  Bone density:  Lipid panel:  Allergies  Allergen Reactions  . Penicillins Swelling    Yeast infection Has patient had a PCN reaction causing immediate rash, facial/tongue/throat swelling, SOB or lightheadedness with hypotension:No Has patient had a PCN reaction causing severe rash involving mucus membranes or skin necrosis: Yes Has patient had a PCN reaction that required hospitalization No Has patient had a PCN reaction occurring within the last 10 years: No If all  of the above answers are "NO", then may proceed with Cephalosporin use.  Yeast infection Has patient had a PCN reaction causing immediate rash, facial/tongue/throat swelling, SOB or lightheadedness with hypotension:No Has patient had a PCN reaction causing severe rash involving mucus membranes or skin necrosis: Yes Has  patient had a PCN reaction that required hospitalization No Has patient had a PCN reaction occurring within the last 10 years: No If all of the above answers are "NO", then may proceed with Cephalosporin use.    Current Outpatient Medications  Medication Sig Dispense Refill  . aspirin 325 MG tablet Take 1 tablet (325 mg total) by mouth daily as needed for headache. (Patient not taking: Reported on 11/30/2018) 30 tablet 0  . B Complex-C-Folic Acid (RENA-VITE RX) 1 MG TABS TAKE 1 TABLET BY MOUTH EVERY DAY (ON DIALYSIS DAYS, TAKE AFTER DIALYSIS TREATMENT)    . mycophenolate (CELLCEPT) 500 MG tablet Take 2 tablets (1,000 mg total) by mouth 2 (two) times daily. 120 tablet 0  . RENAGEL 800 MG tablet Take 1 tablet by mouth 3 (three) times daily.    Marland Kitchen triamcinolone cream (KENALOG) 0.5 % Apply 1 application topically 2 (two) times daily. External ear - 5 days 15 g 0   No current facility-administered medications for this visit.     OBJECTIVE: There were no vitals filed for this visit.   There is no height or weight on file to calculate BMI.    ECOG FS:0 - Asymptomatic  General: Well-developed, well-nourished, no acute distress. Eyes: Pink conjunctiva, anicteric sclera. Lungs: Clear to auscultation bilaterally. Heart: Regular rate and rhythm. No rubs, murmurs, or gallops. Abdomen: Soft, nontender, nondistended. No organomegaly noted, normoactive bowel sounds. Musculoskeletal: No edema, cyanosis, or clubbing. Neuro: Alert, answering all questions appropriately. Cranial nerves grossly intact. Skin: No rashes or petechiae noted. Psych: Normal affect.   LAB RESULTS:  Lab Results  Component Value Date   NA 133 (L) 03/14/2019   K 3.3 (L) 03/14/2019   CL 91 (L) 03/14/2019   CO2 30 03/14/2019   GLUCOSE 117 (H) 03/14/2019   BUN 13 03/14/2019   CREATININE 5.02 (H) 03/14/2019   CALCIUM 9.2 03/14/2019   PROT 7.0 04/26/2018   ALBUMIN 3.4 (L) 04/26/2018   AST 19 04/26/2018   ALT 11 04/26/2018    ALKPHOS 62 04/26/2018   BILITOT 0.4 04/26/2018   GFRNONAA 10 (L) 03/14/2019   GFRAA 12 (L) 03/14/2019    Lab Results  Component Value Date   WBC 3.7 (L) 03/14/2019   NEUTROABS 4.2 10/01/2018   HGB 13.8 03/14/2019   HCT 43.0 03/14/2019   MCV 96.4 03/14/2019   PLT 68 (L) 03/14/2019     STUDIES: No results found.  ASSESSMENT: Lupus nephritis  PLAN:    1. Lupus nephritis: Proceed with cycle 6 of 500 mg/m of IV Cytoxan today. Patient will also receive 10 mg IV Decadron and 0.25 mg Aloxi as premedications. Patient expressed understanding that all of her laboratory work will be monitored by nephrology. If she has any questions or concerns regarding her lupus nephritis or her treatments should be deferred to them as well. Today was patient's final planned treatment and no further follow-up has been scheduled. 2. Anemia: Mild, likely secondary to chronic renal insufficiency. Monitor. 3. Chronic renal insufficiency: Treatment per nephrology. Cytoxan as above.    Patient expressed understanding and was in agreement with this plan. She also understands that She can call clinic at any time with any questions, concerns, or complaints.  Lloyd Huger, MD   04/28/2019 10:33 AM

## 2019-04-29 ENCOUNTER — Other Ambulatory Visit: Payer: Self-pay

## 2019-04-30 ENCOUNTER — Encounter: Payer: Self-pay | Admitting: Oncology

## 2019-04-30 ENCOUNTER — Telehealth: Payer: Self-pay | Admitting: Oncology

## 2019-04-30 ENCOUNTER — Inpatient Hospital Stay: Payer: Medicaid Other | Admitting: Oncology

## 2019-04-30 NOTE — Telephone Encounter (Signed)
Called pt's Kidney provider and informed Malachy Mood the patient did not show up for her appt to see Dr. Grayland Ormond today. Office stated the patient told them she had and appt and would be here. I will mail letter to patient to contact office to r\s.

## 2019-05-17 NOTE — Progress Notes (Signed)
[place]  [phone] Fax:(336) [phone]  ID: Patricia Maldonado OB: [birth year]  MR#: [id number]  [id number]  Patient Care Team: Letta Median, MD as PCP - General (Family Medicine) Center, [place] (General Practice)  CHIEF COMPLAINT: Thrombocytopenia.  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  She currently feels well and is at her baseline.  She initiated dialysis in April 2020. She has no neurologic complaints. She denies any recent fevers or illnesses. She has a good appetite and denies weight loss. She has no chest pain, shortness of breath, cough, or hemoptysis. She denies any nausea, vomiting, constipation, or diarrhea. She has no urinary complaints.  Patient has no specific complaints today.  REVIEW OF SYSTEMS:   Review of Systems  Constitutional: Negative.  Negative for fever, malaise/fatigue and weight loss.  Respiratory: Negative.  Negative for cough and shortness of breath.   Cardiovascular: Negative.  Negative for chest pain and leg swelling.  Gastrointestinal: Negative.  Negative for abdominal pain.  Genitourinary: Negative.   Musculoskeletal: Negative.  Negative for back pain.  Skin: Negative.  Negative for rash.  Neurological: Negative.  Negative for dizziness, focal weakness, weakness and headaches.  Endo/Heme/Allergies: Does not bruise/bleed easily.  Psychiatric/Behavioral: Negative.  Negative for depression. The patient is not nervous/anxious.     As per HPI. Otherwise, a complete review of systems is negative.  PAST MEDICAL HISTORY: Past Medical History:  Diagnosis Date  . Acute renal failure (ARF) ([place])   . Anemia   . Arthritis   . History of kidney stones   . Hypertension    OFF BP MEDS X 3 WEEKS DUE TO BP CONTROL  . 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 Rash   . Staghorn calculus   . 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[place]) 10/2016   DX DURING HOSPITAL ADMISSION    PAST SURGICAL HISTORY: Past Surgical History:  Procedure Laterality Date  . CYSTOSCOPY/URETEROSCOPY/HOLMIUM LASER/STENT PLACEMENT Right 02/21/2017   Procedure: CYSTOSCOPY/URETEROSCOPY/HOLMIUM LASER/STENT EXCHANGE;  Surgeon: [name], MD;  Location: [facility] ORS;  Service: Urology;  Laterality: Right;  . DIALYSIS/PERMA CATHETER INSERTION N/A 11/30/2018   Procedure: DIALYSIS/PERMA CATHETER INSERTION;  Surgeon: Algernon Huxley, MD;  Location: [facility];  Service: Cardiovascular;  Laterality: N/A;  . IR NEPHROSTOMY PLACEMENT RIGHT  01/23/2017  . NEPHROLITHOTOMY Right 01/24/2017   Procedure: NEPHROLITHOTOMY PERCUTANEOUS;  Surgeon: [name], MD;  Location: [facility] ORS;  Service: Urology;  Laterality: Right;  . TUBAL LIGATION      FAMILY HISTORY: Family History  Problem Relation Age of Onset  . Thyroid disease Mother   . Thyroid disease Father   . Diabetes Father     ADVANCED DIRECTIVES (Y/N):  N  HEALTH MAINTENANCE: Social History   Tobacco Use  . Smoking status: Current Every Day Smoker    Packs/day: 0.05    Years: 15.00    Pack years: 0.75    Types: Cigarettes  . Smokeless tobacco: Never Used  Substance Use Topics  . Alcohol use: No  . Drug use: Yes    Types: Marijuana    Comment: OCC     Colonoscopy:  PAP:  Bone density:  Lipid panel:  Allergies  Allergen Reactions  . Penicillins Swelling    Yeast infection Has patient had a PCN reaction causing immediate rash, facial/tongue/throat swelling, SOB or lightheadedness  with hypotension:No Has patient had a PCN reaction causing severe rash involving mucus membranes or skin necrosis: Yes Has patient had a PCN reaction that required hospitalization No Has patient had a PCN reaction occurring within the last 10 years: No If all of the above answers are "NO", then may proceed with  Cephalosporin use.  Yeast infection Has patient had a PCN reaction causing immediate rash, facial/tongue/throat swelling, SOB or lightheadedness with hypotension:No Has patient had a PCN reaction causing severe rash involving mucus membranes or skin necrosis: Yes Has patient had a PCN reaction that required hospitalization No Has patient had a PCN reaction occurring within the last 10 years: No If all of the above answers are "NO", then may proceed with Cephalosporin use.    Current Outpatient Medications  Medication Sig Dispense Refill  . mycophenolate (CELLCEPT) 500 MG tablet Take 2 tablets (1,000 mg total) by mouth 2 (two) times daily. 120 tablet 0  . RENAGEL 800 MG tablet Take 1 tablet by mouth 3 (three) times daily.    Marland Kitchen triamcinolone cream (KENALOG) 0.5 % Apply 1 application topically 2 (two) times daily. External ear - 5 days 15 g 0  . aspirin 325 MG tablet Take 1 tablet (325 mg total) by mouth daily as needed for headache. (Patient not taking: Reported on 11/30/2018) 30 tablet 0  . B Complex-C-Folic Acid (RENA-VITE RX) 1 MG TABS TAKE 1 TABLET BY MOUTH EVERY DAY (ON DIALYSIS DAYS, TAKE AFTER DIALYSIS TREATMENT)     No current facility-administered medications for this visit.     OBJECTIVE: Vitals:   05/22/19 0912  BP: (!) 85/62  Pulse: (!) 126  Temp: 99 F (37.2 C)     Body mass index is 15.55 kg/m.    ECOG FS:0 - Asymptomatic  General: Well-developed, well-nourished, no acute distress. Eyes: Pink conjunctiva, anicteric sclera. HEENT: Normocephalic, moist mucous membranes, clear oropharnyx. Lungs: Clear to auscultation bilaterally. Heart: Regular rate and rhythm. No rubs, murmurs, or gallops. Abdomen: Soft, nontender, nondistended. No organomegaly noted, normoactive bowel sounds. Musculoskeletal: No edema, cyanosis, or clubbing. Neuro: Alert, answering all questions appropriately. Cranial nerves grossly intact. Skin: No rashes or petechiae noted. Psych: Normal affect.  Lymphatics: No cervical, calvicular, axillary or inguinal LAD.   LAB RESULTS:  Lab Results  Component Value Date   NA 133 (L) 03/14/2019   K 3.3 (L) 03/14/2019   CL 91 (L) 03/14/2019   CO2 30 03/14/2019   GLUCOSE 117 (H) 03/14/2019   BUN 13 03/14/2019   CREATININE 5.02 (H) 03/14/2019   CALCIUM 9.2 03/14/2019   PROT 7.0 04/26/2018   ALBUMIN 3.4 (L) 04/26/2018   AST 19 04/26/2018   ALT 11 04/26/2018   ALKPHOS 62 04/26/2018   BILITOT 0.4 04/26/2018   GFRNONAA 10 (L) 03/14/2019   GFRAA 12 (L) 03/14/2019    Lab Results  Component Value Date   WBC 3.3 (L) 05/22/2019   NEUTROABS 4.2 10/01/2018   HGB 10.3 (L) 05/22/2019   HCT 33.0 (L) 05/22/2019   MCV 99.4 05/22/2019   PLT 76 (L) 05/22/2019     STUDIES: No results found.  ASSESSMENT: Thrombocytopenia.  PLAN:    1.  Thrombocytopenia: Patient's platelet count is decreased at 76, but relatively stable for the last several months.  Iron stores, folate, and LDH are within normal limits.  The remainder of her laboratory work is pending at time of dictation.  No intervention is needed at this time.  Return to clinic in 1 month with repeat laboratory  work and further evaluation. 2.  Anemia: Likely secondary to end-stage renal disease.  Unclear if patient receives Procrit along with her dialysis.  Iron stores within normal limits. 3.  Elevated ferritin: Likely secondary to acute phase reactant. 4.  End-stage renal disease: Secondary to lupus nephritis.  Continue dialysis on Tuesdays, Wednesdays, and Saturdays. 5.  Dialysis catheter: Patient states she needs to go to [facility] to have her dialysis catheter placed.  Platelet count greater than 75 should be adequate for surgery, but ideal goal would be to have her platelets approximately 100.     Patient expressed understanding and was in agreement with this plan. She also understands that She can call clinic at any time with any questions, concerns, or complaints.   Lloyd Huger, MD   05/22/2019 2:03 PM

## 2019-05-22 ENCOUNTER — Other Ambulatory Visit: Payer: Self-pay

## 2019-05-22 ENCOUNTER — Inpatient Hospital Stay: Payer: Medicaid Other | Attending: Oncology | Admitting: Oncology

## 2019-05-22 ENCOUNTER — Encounter: Payer: Self-pay | Admitting: Oncology

## 2019-05-22 ENCOUNTER — Inpatient Hospital Stay: Payer: Medicaid Other

## 2019-05-22 VITALS — BP 85/62 | HR 126 | Temp 99.0°F | Ht 62.0 in | Wt 85.0 lb

## 2019-05-22 DIAGNOSIS — R7989 Other specified abnormal findings of blood chemistry: Secondary | ICD-10-CM | POA: Diagnosis not present

## 2019-05-22 DIAGNOSIS — I120 Hypertensive chronic kidney disease with stage 5 chronic kidney disease or end stage renal disease: Secondary | ICD-10-CM | POA: Diagnosis not present

## 2019-05-22 DIAGNOSIS — D696 Thrombocytopenia, unspecified: Secondary | ICD-10-CM | POA: Diagnosis not present

## 2019-05-22 DIAGNOSIS — Z79899 Other long term (current) drug therapy: Secondary | ICD-10-CM | POA: Diagnosis not present

## 2019-05-22 DIAGNOSIS — N186 End stage renal disease: Secondary | ICD-10-CM | POA: Insufficient documentation

## 2019-05-22 DIAGNOSIS — M3214 Glomerular disease in systemic lupus erythematosus: Secondary | ICD-10-CM | POA: Diagnosis not present

## 2019-05-22 DIAGNOSIS — M1990 Unspecified osteoarthritis, unspecified site: Secondary | ICD-10-CM | POA: Insufficient documentation

## 2019-05-22 DIAGNOSIS — Z7982 Long term (current) use of aspirin: Secondary | ICD-10-CM | POA: Insufficient documentation

## 2019-05-22 DIAGNOSIS — F17210 Nicotine dependence, cigarettes, uncomplicated: Secondary | ICD-10-CM | POA: Diagnosis not present

## 2019-05-22 DIAGNOSIS — Z992 Dependence on renal dialysis: Secondary | ICD-10-CM | POA: Insufficient documentation

## 2019-05-22 DIAGNOSIS — D649 Anemia, unspecified: Secondary | ICD-10-CM | POA: Insufficient documentation

## 2019-05-22 LAB — CBC
HCT: 33 % — ABNORMAL LOW (ref 36.0–46.0)
Hemoglobin: 10.3 g/dL — ABNORMAL LOW (ref 12.0–15.0)
MCH: 31 pg (ref 26.0–34.0)
MCHC: 31.2 g/dL (ref 30.0–36.0)
MCV: 99.4 fL (ref 80.0–100.0)
Platelets: 76 10*3/uL — ABNORMAL LOW (ref 150–400)
RBC: 3.32 MIL/uL — ABNORMAL LOW (ref 3.87–5.11)
RDW: 16.2 % — ABNORMAL HIGH (ref 11.5–15.5)
WBC: 3.3 10*3/uL — ABNORMAL LOW (ref 4.0–10.5)
nRBC: 0 % (ref 0.0–0.2)

## 2019-05-22 LAB — VITAMIN B12: Vitamin B-12: 312 pg/mL (ref 180–914)

## 2019-05-22 LAB — LACTATE DEHYDROGENASE: LDH: 144 U/L (ref 98–192)

## 2019-05-22 LAB — IRON AND TIBC
Iron: 67 ug/dL (ref 28–170)
Saturation Ratios: 26 % (ref 10.4–31.8)
TIBC: 256 ug/dL (ref 250–450)
UIBC: 189 ug/dL

## 2019-05-22 LAB — FOLATE: Folate: 8.9 ng/mL (ref >5.9)

## 2019-05-22 LAB — FERRITIN: Ferritin: 1107 ng/mL — ABNORMAL HIGH (ref 11–307)

## 2019-05-22 NOTE — Progress Notes (Signed)
Patient stated that she had been doing well with no complaints. 

## 2019-05-23 LAB — PLATELET ANTIBODY PROFILE
Glycoprotein IV Antibody: NEGATIVE
HLA Ab Ser Ql EIA: NEGATIVE
IA/IIA Antibody: POSITIVE — AB
IB/IX Antibody: NEGATIVE
IIB/IIIA Antibody: POSITIVE — AB

## 2019-05-23 LAB — HAPTOGLOBIN: Haptoglobin: 85 mg/dL (ref 33–278)

## 2019-06-07 NOTE — Progress Notes (Deleted)
[place]  [phone] Fax:(336) [phone]  ID: Patricia Maldonado OB: 1981/03/07  MR#: [id number]  [id number]  Patient Care Team: Letta Median, MD as PCP - General (Family Medicine) Center, [place] (General Practice)  CHIEF COMPLAINT: Thrombocytopenia.  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  She currently feels well and is at her baseline.  She initiated dialysis in April 2020. She has no neurologic complaints. She denies any recent fevers or illnesses. She has a good appetite and denies weight loss. She has no chest pain, shortness of breath, cough, or hemoptysis. She denies any nausea, vomiting, constipation, or diarrhea. She has no urinary complaints.  Patient has no specific complaints today.  REVIEW OF SYSTEMS:   Review of Systems  Constitutional: Negative.  Negative for fever, malaise/fatigue and weight loss.  Respiratory: Negative.  Negative for cough and shortness of breath.   Cardiovascular: Negative.  Negative for chest pain and leg swelling.  Gastrointestinal: Negative.  Negative for abdominal pain.  Genitourinary: Negative.   Musculoskeletal: Negative.  Negative for back pain.  Skin: Negative.  Negative for rash.  Neurological: Negative.  Negative for dizziness, focal weakness, weakness and headaches.  Endo/Heme/Allergies: Does not bruise/bleed easily.  Psychiatric/Behavioral: Negative.  Negative for depression. The patient is not nervous/anxious.     As per HPI. Otherwise, a complete review of systems is negative.  PAST MEDICAL HISTORY: Past Medical History:  Diagnosis Date  . Acute renal failure (ARF) ([place])   . Anemia   . Arthritis   . History of kidney stones   . Hypertension    OFF BP MEDS X 3 WEEKS DUE TO BP CONTROL  . 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 Rash   . Staghorn calculus   . 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 CYSTOSCOPY/URETEROSCOPY/HOLMIUM LASER/STENT PLACEMENT Right 02/21/2017   Procedure: CYSTOSCOPY/URETEROSCOPY/HOLMIUM LASER/STENT EXCHANGE;  Surgeon: [name], MD;  Location: [facility] ORS;  Service: Urology;  Laterality: Right;  . DIALYSIS/PERMA CATHETER INSERTION N/A 11/30/2018   Procedure: DIALYSIS/PERMA CATHETER INSERTION;  Surgeon: Algernon Huxley, MD;  Location: [facility];  Service: Cardiovascular;  Laterality: N/A;  . IR NEPHROSTOMY PLACEMENT RIGHT  01/23/2017  . NEPHROLITHOTOMY Right 01/24/2017   Procedure: NEPHROLITHOTOMY PERCUTANEOUS;  Surgeon: [name], MD;  Location: [facility] ORS;  Service: Urology;  Laterality: Right;  . TUBAL LIGATION      FAMILY HISTORY: Family History  Problem Relation Age of Onset  . Thyroid disease Mother   . Thyroid disease Father   . Diabetes Father     ADVANCED DIRECTIVES (Y/N):  N  HEALTH MAINTENANCE: Social History   Tobacco Use  . Smoking status: Current Every Day Smoker    Packs/day: 0.05    Years: 15.00    Pack years: 0.75    Types: Cigarettes  . Smokeless tobacco: Never Used  Substance Use Topics  . Alcohol use: No  . Drug use: Yes    Types: Marijuana    Comment: OCC     Colonoscopy:  PAP:  Bone density:  Lipid panel:  Allergies  Allergen Reactions  . Penicillins Swelling    Yeast infection Has patient had a PCN reaction causing immediate rash, facial/tongue/throat swelling, SOB or lightheadedness  with hypotension:No Has patient had a PCN reaction causing severe rash involving mucus membranes or skin necrosis: Yes Has patient had a PCN reaction that required hospitalization No Has patient had a PCN reaction occurring within the last 10 years: No If all of the above answers are "NO", then may proceed with  Cephalosporin use.  Yeast infection Has patient had a PCN reaction causing immediate rash, facial/tongue/throat swelling, SOB or lightheadedness with hypotension:No Has patient had a PCN reaction causing severe rash involving mucus membranes or skin necrosis: Yes Has patient had a PCN reaction that required hospitalization No Has patient had a PCN reaction occurring within the last 10 years: No If all of the above answers are "NO", then may proceed with Cephalosporin use.    Current Outpatient Medications  Medication Sig Dispense Refill  . aspirin 325 MG tablet Take 1 tablet (325 mg total) by mouth daily as needed for headache. (Patient not taking: Reported on 11/30/2018) 30 tablet 0  . B Complex-C-Folic Acid (RENA-VITE RX) 1 MG TABS TAKE 1 TABLET BY MOUTH EVERY DAY (ON DIALYSIS DAYS, TAKE AFTER DIALYSIS TREATMENT)    . mycophenolate (CELLCEPT) 500 MG tablet Take 2 tablets (1,000 mg total) by mouth 2 (two) times daily. 120 tablet 0  . RENAGEL 800 MG tablet Take 1 tablet by mouth 3 (three) times daily.    Marland Kitchen triamcinolone cream (KENALOG) 0.5 % Apply 1 application topically 2 (two) times daily. External ear - 5 days 15 g 0   No current facility-administered medications for this visit.     OBJECTIVE: There were no vitals filed for this visit.   There is no height or weight on file to calculate BMI.    ECOG FS:0 - Asymptomatic  General: Well-developed, well-nourished, no acute distress. Eyes: Pink conjunctiva, anicteric sclera. HEENT: Normocephalic, moist mucous membranes, clear oropharnyx. Lungs: Clear to auscultation bilaterally. Heart: Regular rate and rhythm. No rubs, murmurs, or gallops. Abdomen: Soft, nontender, nondistended. No organomegaly noted, normoactive bowel sounds. Musculoskeletal: No edema, cyanosis, or clubbing. Neuro: Alert, answering all questions appropriately. Cranial nerves grossly intact. Skin: No rashes or petechiae noted. Psych: Normal affect. Lymphatics: No  cervical, calvicular, axillary or inguinal LAD.   LAB RESULTS:  Lab Results  Component Value Date   NA 133 (L) 03/14/2019   K 3.3 (L) 03/14/2019   CL 91 (L) 03/14/2019   CO2 30 03/14/2019   GLUCOSE 117 (H) 03/14/2019   BUN 13 03/14/2019   CREATININE 5.02 (H) 03/14/2019   CALCIUM 9.2 03/14/2019   PROT 7.0 04/26/2018   ALBUMIN 3.4 (L) 04/26/2018   AST 19 04/26/2018   ALT 11 04/26/2018   ALKPHOS 62 04/26/2018   BILITOT 0.4 04/26/2018   GFRNONAA 10 (L) 03/14/2019   GFRAA 12 (L) 03/14/2019    Lab Results  Component Value Date   WBC 3.3 (L) 05/22/2019   NEUTROABS 4.2 10/01/2018   HGB 10.3 (L) 05/22/2019   HCT 33.0 (L) 05/22/2019   MCV 99.4 05/22/2019   PLT 76 (L) 05/22/2019     STUDIES: No results found.  ASSESSMENT: Thrombocytopenia.  PLAN:    1.  Thrombocytopenia: Patient's platelet count is decreased at 76, but relatively stable for the last several months.  Iron stores, folate, and LDH are within normal limits.  The remainder of her laboratory work is pending at time of dictation.  No intervention is needed at this time.  Return to clinic in 1 month with repeat laboratory work and further evaluation. 2.  Anemia: Likely  secondary to end-stage renal disease.  Unclear if patient receives Procrit along with her dialysis.  Iron stores within normal limits. 3.  Elevated ferritin: Likely secondary to acute phase reactant. 4.  End-stage renal disease: Secondary to lupus nephritis.  Continue dialysis on Tuesdays, Wednesdays, and Saturdays. 5.  Dialysis catheter: Patient states she needs to go to [facility] to have her dialysis catheter placed.  Platelet count greater than 75 should be adequate for surgery, but ideal goal would be to have her platelets approximately 100.     Patient expressed understanding and was in agreement with this plan. She also understands that She can call clinic at any time with any questions, concerns, or complaints.   Lloyd Huger, MD    06/07/2019 4:43 PM

## 2019-06-21 ENCOUNTER — Inpatient Hospital Stay: Payer: Medicaid Other

## 2019-06-21 ENCOUNTER — Inpatient Hospital Stay: Payer: Medicaid Other | Admitting: Oncology

## 2019-07-26 ENCOUNTER — Other Ambulatory Visit: Payer: Self-pay

## 2019-07-26 ENCOUNTER — Emergency Department
Admission: EM | Admit: 2019-07-26 | Discharge: 2019-07-26 | Disposition: A | Discharge status: Home / Self Care | Payer: Medicaid Other | Attending: Emergency Medicine | Admitting: Emergency Medicine

## 2019-07-26 DIAGNOSIS — Z86711 Personal history of pulmonary embolism: Secondary | ICD-10-CM | POA: Diagnosis not present

## 2019-07-26 DIAGNOSIS — F1210 Cannabis abuse, uncomplicated: Secondary | ICD-10-CM | POA: Diagnosis not present

## 2019-07-26 DIAGNOSIS — Z7982 Long term (current) use of aspirin: Secondary | ICD-10-CM | POA: Insufficient documentation

## 2019-07-26 DIAGNOSIS — F17210 Nicotine dependence, cigarettes, uncomplicated: Secondary | ICD-10-CM | POA: Insufficient documentation

## 2019-07-26 DIAGNOSIS — I120 Hypertensive chronic kidney disease with stage 5 chronic kidney disease or end stage renal disease: Secondary | ICD-10-CM | POA: Insufficient documentation

## 2019-07-26 DIAGNOSIS — Z4802 Encounter for removal of sutures: Secondary | ICD-10-CM | POA: Insufficient documentation

## 2019-07-26 DIAGNOSIS — Z79899 Other long term (current) drug therapy: Secondary | ICD-10-CM | POA: Insufficient documentation

## 2019-07-26 DIAGNOSIS — N186 End stage renal disease: Secondary | ICD-10-CM | POA: Insufficient documentation

## 2019-07-26 NOTE — ED Provider Notes (Signed)
[facility] Emergency Department Provider Note  ____________________________________________  Time seen: Approximately 6:04 PM  I have reviewed the triage vital signs and the nursing notes.   HISTORY  Chief Complaint Suture / Staple Removal    HPI Patricia Maldonado is a 38 y.o. female with a history of lupus, CKD and peritoneal dialysis catheter insertion on 07/09/2019, presents to the emergency department for suture removal.  Patient has 13 external sutures that were supposed to be removed on July 17, 2019.  Patient states that she could not get a ride to the emergency department and suture removal had to be delayed.  She has no concerns and surgical wounds have healed without complication.        Past Medical History:  Diagnosis Date  . Acute renal failure (ARF) ([place])   . Anemia   . Arthritis   . History of kidney stones   . Hypertension    OFF BP MEDS X 3 WEEKS DUE TO BP CONTROL  . 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 Rash   . Staghorn calculus   . 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 Anemia, unspecified 04/28/2019  . End stage renal disease ([place]) 02/21/2019  . Essential hypertension 02/21/2019  . CKD (chronic kidney disease), stage V ([place]) 09/12/2018  . Metabolic acidosis [id number]  . Secondary hyperparathyroidism ([place]) 09/12/2018  . Acute on chronic renal failure ([place]) 04/26/2018  . Acute kidney injury ([place]) 07/27/2017  . Alopecia of scalp 03/08/2017  . Thyroid nodule 03/08/2017  . PE (pulmonary thromboembolism) ([place]) 03/01/2017  . Kidney stone on left side 01/23/2017  . Systemic lupus erythematosus ([place]) 12/08/2016  . Encounter for long-term (current) use of high-risk medication 12/08/2016  . 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 Lupus nephritis ([place]) 11/07/2016  . Acute renal failure (ARF) ([place]) 10/04/2016  . Nephrolithiasis   .  Staghorn calculus   . Acute renal failure with tubular necrosis ([place]) 08/30/2016    Past Surgical History:  Procedure Laterality Date  . CYSTOSCOPY/URETEROSCOPY/HOLMIUM LASER/STENT PLACEMENT Right 02/21/2017   Procedure: CYSTOSCOPY/URETEROSCOPY/HOLMIUM LASER/STENT EXCHANGE;  Surgeon: [name], MD;  Location: [facility] ORS;  Service: Urology;  Laterality: Right;  . DIALYSIS/PERMA CATHETER INSERTION N/A 11/30/2018   Procedure: DIALYSIS/PERMA CATHETER INSERTION;  Surgeon: Algernon Huxley, MD;  Location: [place] [facility] LAB;  Service: Cardiovascular;  Laterality: N/A;  . IR NEPHROSTOMY PLACEMENT RIGHT  01/23/2017  . NEPHROLITHOTOMY Right 01/24/2017   Procedure: NEPHROLITHOTOMY PERCUTANEOUS;  Surgeon: [name], MD;  Location: [facility] ORS;  Service: Urology;  Laterality: Right;  . TUBAL LIGATION      Prior to Admission medications   Medication Sig Start Date End Date Taking? Authorizing Provider  aspirin 325 MG tablet Take 1 tablet (325 mg total) by mouth daily as needed for headache. Patient not taking: Reported on 11/30/2018 07/30/17   Loletha Grayer, MD  B Complex-C-Folic Acid (RENA-VITE RX) 1 MG TABS TAKE 1 TABLET BY MOUTH EVERY DAY (ON DIALYSIS DAYS, TAKE AFTER DIALYSIS TREATMENT) 01/03/19   [provider]  mycophenolate (CELLCEPT) 500 MG tablet Take 2 tablets (1,000 mg total) by mouth 2 (two) times daily. 04/30/18   Sudini, Alveta Heimlich, MD  RENAGEL 800 MG tablet Take 1 tablet by mouth 3 (three) times daily. 02/21/19   [provider]  triamcinolone cream (KENALOG) 0.5 % Apply 1 application topically 2 (two) times daily. External ear -  5 days 05/01/18   Hillary Bow, MD    Allergies Penicillins  Family History  Problem Relation Age of Onset  . Thyroid disease Mother   . Thyroid disease Father   . Diabetes Father     Social History Social History   Tobacco Use  . Smoking status: Current Every Day Smoker    Packs/day: 0.05    Years: 15.00    Pack years: 0.75     Types: Cigarettes  . Smokeless tobacco: Never Used  Substance Use Topics  . Alcohol use: No  . Drug use: Yes    Types: Marijuana    Comment: OCC     Review of Systems  Constitutional: No fever/chills Eyes: No visual changes. No discharge ENT: No upper respiratory complaints. Cardiovascular: no chest pain. Respiratory: no cough. No SOB. Gastrointestinal: No abdominal pain.  No nausea, no vomiting.  No diarrhea.  No constipation. Genitourinary: Negative for dysuria. No hematuria Musculoskeletal: Negative for musculoskeletal pain. Skin: Patient has sutured surgical wounds of abdomen.  Neurological: Negative for headaches, focal weakness or numbness.   ____________________________________________   PHYSICAL EXAM:  VITAL SIGNS: ED Triage Vitals  Enc Vitals Group     BP 07/26/19 1710 112/82     Pulse Rate 07/26/19 1710 100     Resp 07/26/19 1710 16     Temp 07/26/19 1710 98.5 F (36.9 C)     Temp Source 07/26/19 1710 Oral     SpO2 07/26/19 1710 100 %     Weight 07/26/19 1705 90 lb (40.8 kg)     Height 07/26/19 1705 5\' 2"  (1.575 m)     Head Circumference --      Peak Flow --      Pain Score 07/26/19 1704 5     Pain Loc --      Pain Edu? --      Excl. in [place]? --      Constitutional: Alert and oriented. Well appearing and in no acute distress. Eyes: Conjunctivae are normal. PERRL. EOMI. Head: Atraumatic. Cardiovascular: Normal rate, regular rhythm. Normal S1 and S2.  Good peripheral circulation. Respiratory: Normal respiratory effort without tachypnea or retractions. Lungs CTAB. Good air entry to the bases with no decreased or absent breath sounds. Gastrointestinal: Bowel sounds 4 quadrants. Soft and nontender to palpation. No guarding or rigidity. No palpable masses. No distention. No CVA tenderness. Musculoskeletal: Full range of motion to all extremities. No gross deformities appreciated. Neurologic:  Normal speech and language. No gross focal neurologic deficits are  appreciated.   Skin: Patient has 13 sutures of abdomen.  No surrounding cellulitis. Psychiatric: Mood and affect are normal. Speech and behavior are normal. Patient exhibits appropriate insight and judgement.   ____________________________________________   LABS (all labs ordered are listed, but only abnormal results are displayed)  Labs Reviewed - No data to display ____________________________________________  EKG   ____________________________________________  RADIOLOGY   No results found.  ____________________________________________    PROCEDURES  Procedure(s) performed:    Procedures  SUTURE REMOVAL Performed by: Lannie Fields  Consent: Verbal consent obtained. Patient identity confirmed: provided demographic data Time out: Immediately prior to procedure a "time out" was called to verify the correct patient, procedure, equipment, support staff and site/side marked as required.  Location details: Abdomen  Wound Appearance: clean  Sutures/Staples Removed: 13  Facility: sutures placed in this facility Patient tolerance: Patient tolerated the procedure well with no immediate complications.     Medications - No data to display  ____________________________________________   INITIAL IMPRESSION / ASSESSMENT AND PLAN / ED COURSE  Pertinent labs & imaging results that were available during my care of the patient were reviewed by me and considered in my medical decision making (see chart for details).  Review of the [place] CSRS was performed in accordance of the [place] prior to dispensing any controlled drugs.           Assessment and Plan: Suture removal 38 year old female presents to the emergency department for suture removal.  Patient had 13 sutures localized to abdomen that were placed during peritoneal dialysis catheter insertion.  Sutures were removed without complication.  All patient questions were  answered.    ____________________________________________  FINAL CLINICAL IMPRESSION(S) / ED DIAGNOSES  Final diagnoses:  Visit for suture removal      NEW MEDICATIONS STARTED DURING THIS VISIT:  ED Discharge Orders    None          This chart was dictated using voice recognition software/Dragon. Despite best efforts to proofread, errors can occur which can change the meaning. Any change was purely unintentional.    Lannie Fields, PA-C 07/26/19 1809    Vanessa Greenbriar, MD 07/26/19 [id number]

## 2019-07-26 NOTE — Discharge Instructions (Signed)
Apply Vitamin E oil to affected areas twice daily to help with scarring.

## 2019-07-26 NOTE — ED Notes (Signed)
See triage note  States she is here for suture removal  Pt has several areas to abd with 2-3 sutures intact

## 2019-07-26 NOTE — ED Triage Notes (Signed)
Pt here to get 6 stiches removed from abdomen. Had a PDA cath that was placed at [facility] on Jul 09, 2019. Pt states she was having trouble getting back to [place], they told her to come here to have stitches removed. Site without s/s of infection.

## 2019-07-28 NOTE — Progress Notes (Deleted)
[facility]  [phone] [phone]) [phone]  ID: Patricia Maldonado OB: October 09, 1980  MR#: [id number]  [id number]  Patient Care Team: Letta Median, MD as PCP - [facility] (Family Medicine) Center, [place] (General Practice)  CHIEF COMPLAINT: Thrombocytopenia.  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  She currently feels well and is at her baseline.  She initiated dialysis in April 2020. She has no neurologic complaints. She denies any recent fevers or illnesses. She has a good appetite and denies weight loss. She has no chest pain, shortness of breath, cough, or hemoptysis. She denies any nausea, vomiting, constipation, or diarrhea. She has no urinary complaints.  Patient has no specific complaints today.  REVIEW OF SYSTEMS:   Review of Systems  Constitutional: Negative.  Negative for fever, malaise/fatigue and weight loss.  Respiratory: Negative.  Negative for cough and shortness of breath.   Cardiovascular: Negative.  Negative for chest pain and leg swelling.  Gastrointestinal: Negative.  Negative for abdominal pain.  Genitourinary: Negative.   Musculoskeletal: Negative.  Negative for back pain.  Skin: Negative.  Negative for rash.  Neurological: Negative.  Negative for dizziness, focal weakness, weakness and headaches.  Endo/Heme/Allergies: Does not bruise/bleed easily.  Psychiatric/Behavioral: Negative.  Negative for depression. The patient is not nervous/anxious.     As per HPI. Otherwise, a complete review of systems is negative.  PAST MEDICAL HISTORY: Past Medical History:  Diagnosis Date  . Acute renal failure (ARF) ([place])   . Anemia   . Arthritis   . History of kidney stones   . Hypertension    OFF BP MEDS X 3 WEEKS DUE TO BP CONTROL  . 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 Rash   . Staghorn calculus   . 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 CYSTOSCOPY/URETEROSCOPY/HOLMIUM LASER/STENT PLACEMENT Right 02/21/2017   Procedure: CYSTOSCOPY/URETEROSCOPY/HOLMIUM LASER/STENT EXCHANGE;  Surgeon: [name], MD;  Location: [facility] ORS;  Service: Urology;  Laterality: Right;  . DIALYSIS/PERMA CATHETER INSERTION N/A 11/30/2018   Procedure: DIALYSIS/PERMA CATHETER INSERTION;  Surgeon: Algernon Huxley, MD;  Location: [facility];  Service: Cardiovascular;  Laterality: N/A;  . IR NEPHROSTOMY PLACEMENT RIGHT  01/23/2017  . NEPHROLITHOTOMY Right 01/24/2017   Procedure: NEPHROLITHOTOMY PERCUTANEOUS;  Surgeon: [name], MD;  Location: [facility] ORS;  Service: Urology;  Laterality: Right;  . TUBAL LIGATION      FAMILY HISTORY: Family History  Problem Relation Age of Onset  . Thyroid disease Mother   . Thyroid disease Father   . Diabetes Father     ADVANCED DIRECTIVES (Y/N):  N  HEALTH MAINTENANCE: Social History   Tobacco Use  . Smoking status: Current Every Day Smoker    Packs/day: 0.05    Years: 15.00    Pack years: 0.75    Types: Cigarettes  . Smokeless tobacco: Never Used  Substance Use Topics  . Alcohol use: No  . Drug use: Yes    Types: Marijuana    Comment: OCC     Colonoscopy:  PAP:  Bone density:  Lipid panel:  Allergies  Allergen Reactions  . Penicillins Swelling    Yeast infection Has patient had a PCN reaction causing immediate rash, facial/tongue/throat swelling, SOB or lightheadedness  with hypotension:No Has patient had a PCN reaction causing severe rash involving mucus membranes or skin necrosis: Yes Has patient had a PCN reaction that required hospitalization No Has patient had a PCN reaction occurring within the last 10 years: No If all of the above answers are "NO", then may proceed with  Cephalosporin use.  Yeast infection Has patient had a PCN reaction causing immediate rash, facial/tongue/throat swelling, SOB or lightheadedness with hypotension:No Has patient had a PCN reaction causing severe rash involving mucus membranes or skin necrosis: Yes Has patient had a PCN reaction that required hospitalization No Has patient had a PCN reaction occurring within the last 10 years: No If all of the above answers are "NO", then may proceed with Cephalosporin use.    Current Outpatient Medications  Medication Sig Dispense Refill  . aspirin 325 MG tablet Take 1 tablet (325 mg total) by mouth daily as needed for headache. (Patient not taking: Reported on 11/30/2018) 30 tablet 0  . B Complex-C-Folic Acid (RENA-VITE RX) 1 MG TABS TAKE 1 TABLET BY MOUTH EVERY DAY (ON DIALYSIS DAYS, TAKE AFTER DIALYSIS TREATMENT)    . mycophenolate (CELLCEPT) 500 MG tablet Take 2 tablets (1,000 mg total) by mouth 2 (two) times daily. 120 tablet 0  . RENAGEL 800 MG tablet Take 1 tablet by mouth 3 (three) times daily.    Marland Kitchen triamcinolone cream (KENALOG) 0.5 % Apply 1 application topically 2 (two) times daily. External ear - 5 days 15 g 0   No current facility-administered medications for this visit.     OBJECTIVE: There were no vitals filed for this visit.   There is no height or weight on file to calculate BMI.    ECOG FS:0 - Asymptomatic  General: Well-developed, well-nourished, no acute distress. Eyes: Pink conjunctiva, anicteric sclera. HEENT: Normocephalic, moist mucous membranes, clear oropharnyx. Lungs: Clear to auscultation bilaterally. Heart: Regular rate and rhythm. No rubs, murmurs, or gallops. Abdomen: Soft, nontender, nondistended. No organomegaly noted, normoactive bowel sounds. Musculoskeletal: No edema, cyanosis, or clubbing. Neuro: Alert, answering all questions appropriately. Cranial nerves grossly intact. Skin: No rashes or petechiae noted. Psych: Normal affect. Lymphatics: No  cervical, calvicular, axillary or inguinal LAD.   LAB RESULTS:  Lab Results  Component Value Date   NA 133 (L) 03/14/2019   K 3.3 (L) 03/14/2019   CL 91 (L) 03/14/2019   CO2 30 03/14/2019   GLUCOSE 117 (H) 03/14/2019   BUN 13 03/14/2019   CREATININE 5.02 (H) 03/14/2019   CALCIUM 9.2 03/14/2019   PROT 7.0 04/26/2018   ALBUMIN 3.4 (L) 04/26/2018   AST 19 04/26/2018   ALT 11 04/26/2018   ALKPHOS 62 04/26/2018   BILITOT 0.4 04/26/2018   GFRNONAA 10 (L) 03/14/2019   GFRAA 12 (L) 03/14/2019    Lab Results  Component Value Date   WBC 3.3 (L) 05/22/2019   NEUTROABS 4.2 10/01/2018   HGB 10.3 (L) 05/22/2019   HCT 33.0 (L) 05/22/2019   MCV 99.4 05/22/2019   PLT 76 (L) 05/22/2019     STUDIES: No results found.  ASSESSMENT: Thrombocytopenia.  PLAN:    1.  Thrombocytopenia: Patient's platelet count is decreased at 76, but relatively stable for the last several months.  Iron stores, folate, and LDH are within normal limits.  The remainder of her laboratory work is pending at time of dictation.  No intervention is needed at this time.  Return to clinic in 1 month with repeat laboratory work and further evaluation. 2.  Anemia: Likely  secondary to end-stage renal disease.  Unclear if patient receives Procrit along with her dialysis.  Iron stores within normal limits. 3.  Elevated ferritin: Likely secondary to acute phase reactant. 4.  End-stage renal disease: Secondary to lupus nephritis.  Continue dialysis on Tuesdays, Wednesdays, and Saturdays. 5.  Dialysis catheter: Patient states she needs to go to [facility] to have her dialysis catheter placed.  Platelet count greater than 75 should be adequate for surgery, but ideal goal would be to have her platelets approximately 100.     Patient expressed understanding and was in agreement with this plan. She also understands that She can call clinic at any time with any questions, concerns, or complaints.   Lloyd Huger, MD    07/28/2019 7:01 AM

## 2019-07-29 ENCOUNTER — Inpatient Hospital Stay: Payer: Medicaid Other | Admitting: Oncology

## 2019-07-29 ENCOUNTER — Inpatient Hospital Stay: Payer: Medicaid Other

## 2019-08-27 ENCOUNTER — Telehealth (INDEPENDENT_AMBULATORY_CARE_PROVIDER_SITE_OTHER): Payer: Self-pay

## 2019-08-27 NOTE — Telephone Encounter (Signed)
I attempted to contact the patient and a message was left for a return call. 

## 2019-09-05 ENCOUNTER — Encounter (INDEPENDENT_AMBULATORY_CARE_PROVIDER_SITE_OTHER): Payer: Self-pay

## 2019-09-20 ENCOUNTER — Encounter
Admission: RE | Admit: 2019-09-20 | Discharge: 2019-09-20 | Disposition: A | Discharge status: Home / Self Care | Payer: Medicaid Other | Source: Ambulatory Visit | Attending: Vascular Surgery | Admitting: Vascular Surgery

## 2019-09-20 ENCOUNTER — Other Ambulatory Visit (INDEPENDENT_AMBULATORY_CARE_PROVIDER_SITE_OTHER): Payer: Self-pay | Admitting: Nurse Practitioner

## 2019-09-20 ENCOUNTER — Other Ambulatory Visit: Payer: Self-pay

## 2019-09-20 ENCOUNTER — Other Ambulatory Visit
Admission: RE | Admit: 2019-09-20 | Discharge: 2019-09-20 | Disposition: A | Discharge status: Home / Self Care | Payer: Medicaid Other | Source: Ambulatory Visit | Attending: Vascular Surgery | Admitting: Vascular Surgery

## 2019-09-20 DIAGNOSIS — Z01818 Encounter for other preprocedural examination: Secondary | ICD-10-CM | POA: Diagnosis not present

## 2019-09-20 DIAGNOSIS — Z20828 Contact with and (suspected) exposure to other viral communicable diseases: Secondary | ICD-10-CM | POA: Insufficient documentation

## 2019-09-20 LAB — BASIC METABOLIC PANEL
Anion gap: 13 (ref 5–15)
BUN: 17 mg/dL (ref 6–20)
CO2: 27 mmol/L (ref 22–32)
Calcium: 9.2 mg/dL (ref 8.9–10.3)
Chloride: 99 mmol/L (ref 98–111)
Creatinine, Ser: 5.88 mg/dL — ABNORMAL HIGH (ref 0.44–1.00)
GFR calc Af Amer: 10 mL/min — ABNORMAL LOW (ref >60)
GFR calc non Af Amer: 9 mL/min — ABNORMAL LOW (ref >60)
Glucose, Bld: 87 mg/dL (ref 70–99)
Potassium: 3.2 mmol/L — ABNORMAL LOW (ref 3.5–5.1)
Sodium: 139 mmol/L (ref 135–145)

## 2019-09-20 LAB — CBC WITH DIFFERENTIAL/PLATELET
Abs Immature Granulocytes: 0.01 10*3/uL (ref 0.00–0.07)
Basophils Absolute: 0 10*3/uL (ref 0.0–0.1)
Basophils Relative: 1 %
Eosinophils Absolute: 0.1 10*3/uL (ref 0.0–0.5)
Eosinophils Relative: 3 %
HCT: 30.2 % — ABNORMAL LOW (ref 36.0–46.0)
Hemoglobin: 9.6 g/dL — ABNORMAL LOW (ref 12.0–15.0)
Immature Granulocytes: 0 %
Lymphocytes Relative: 20 %
Lymphs Abs: 0.6 10*3/uL — ABNORMAL LOW (ref 0.7–4.0)
MCH: 29.8 pg (ref 26.0–34.0)
MCHC: 31.8 g/dL (ref 30.0–36.0)
MCV: 93.8 fL (ref 80.0–100.0)
Monocytes Absolute: 0.3 10*3/uL (ref 0.1–1.0)
Monocytes Relative: 9 %
Neutro Abs: 2 10*3/uL (ref 1.7–7.7)
Neutrophils Relative %: 67 %
Platelets: 77 10*3/uL — ABNORMAL LOW (ref 150–400)
RBC: 3.22 MIL/uL — ABNORMAL LOW (ref 3.87–5.11)
RDW: 13.7 % (ref 11.5–15.5)
WBC: 2.9 10*3/uL — ABNORMAL LOW (ref 4.0–10.5)
nRBC: 0 % (ref 0.0–0.2)

## 2019-09-20 LAB — TYPE AND SCREEN
ABO/RH(D): B POS
Antibody Screen: NEGATIVE

## 2019-09-20 LAB — PROTIME-INR
INR: 1 (ref 0.8–1.2)
Prothrombin Time: 13 seconds (ref 11.4–15.2)

## 2019-09-20 LAB — APTT: aPTT: 33 seconds (ref 24–36)

## 2019-09-20 LAB — SARS CORONAVIRUS 2 (TAT 6-24 HRS): SARS Coronavirus 2: NEGATIVE

## 2019-09-20 NOTE — Patient Instructions (Addendum)
Your procedure is scheduled on: 09/25/19 Report to [place]. To find out your arrival time please call [phone] between 1PM - 3PM on 09/24/19.  Remember: Instructions that are not followed completely may result in serious medical risk, up to and including death, or upon the discretion of your surgeon and anesthesiologist your surgery may need to be rescheduled.     _X__ 1. Do not eat food after midnight the night before your procedure.                 No gum chewing or hard candies. You may drink clear liquids up to 2 hours                 before you are scheduled to arrive for your surgery- DO not drink clear                 liquids within 2 hours of the start of your surgery.                 Clear Liquids include:  water, apple juice without pulp, clear carbohydrate                 drink such as Clearfast or Gatorade, Black Coffee or Tea (Do not add                 anything to coffee or tea). Diabetics water only  __X__2.  On the morning of surgery brush your teeth with toothpaste and water, you                 may rinse your mouth with mouthwash if you wish.  Do not swallow any              toothpaste of mouthwash.     _X__ 3.  No Alcohol for 24 hours before or after surgery.   _X__ 4.  Do Not Smoke or use e-cigarettes For 24 Hours Prior to Your Surgery.                 Do not use any chewable tobacco products for at least 6 hours prior to                 surgery.  ____  5.  Bring all medications with you on the day of surgery if instructed.   __X__  6.  Notify your doctor if there is any change in your medical condition      (cold, fever, infections).     Do not wear jewelry, make-up, hairpins, clips or nail polish. Do not wear lotions, powders, or perfumes.  Do not shave 48 hours prior to surgery. Men may shave face and neck. Do not bring valuables to the hospital.    [facility] is not responsible for any belongings  or valuables.  Contacts, dentures/partials or body piercings may not be worn into surgery. Bring a case for your contacts, glasses or hearing aids, a denture cup will be supplied. Leave your suitcase in the car. After surgery it may be brought to your room. For patients admitted to the hospital, discharge time is determined by your treatment team.   Patients discharged the day of surgery will not be allowed to drive home.   Please read over the following fact sheets that you were given:   MRSA Information  __X__ Take these medicines the morning of surgery with A SIP OF WATER:  1. none  2.   3.   4.  5.  6.  ____ Fleet Enema (as directed)   __X__ Use CHG Soap/SAGE wipes as directed  ____ Use inhalers on the day of surgery  ____ Stop metformin/Janumet/Farxiga 2 days prior to surgery    ____ Take 1/2 of usual insulin dose the night before surgery. No insulin the morning          of surgery.   ____ Stop Blood Thinners Coumadin/Plavix/Xarelto/Pleta/Pradaxa/Eliquis/Effient/Aspirin  on   Or contact your Surgeon, Cardiologist or Medical Doctor regarding  ability to stop your blood thinners  __X__ Stop Anti-inflammatories 7 days before surgery such as Advil, Ibuprofen, Motrin,  BC or Goodies Powder, Naprosyn, Naproxen, Aleve, Aspirin    __X__ Stop all herbal supplements, fish oil or vitamin E until after surgery.    ____ Bring C-Pap to the hospital.

## 2019-09-20 NOTE — Pre-Procedure Instructions (Signed)
K+ today 3.2. Hx renal failure with dialysis T, Th, Sat. Dr Andree Elk notified. Will repeat K+ DOS.

## 2019-09-23 ENCOUNTER — Telehealth (INDEPENDENT_AMBULATORY_CARE_PROVIDER_SITE_OTHER): Payer: Self-pay

## 2019-09-23 ENCOUNTER — Encounter (INDEPENDENT_AMBULATORY_CARE_PROVIDER_SITE_OTHER): Payer: Self-pay

## 2019-09-23 NOTE — Telephone Encounter (Signed)
Spoke with Misty at [facility] and informed her that the patient had to be rescheduled from 09/25/2019 to 10/02/2019 and she asked that I send over the pre-surgical instructions for her to give to the patient. This will be faxed to [facility] at [facility].

## 2019-09-30 ENCOUNTER — Other Ambulatory Visit: Payer: Medicaid Other

## 2019-10-01 ENCOUNTER — Other Ambulatory Visit: Admission: RE | Admit: 2019-10-01 | Payer: Medicaid Other | Source: Ambulatory Visit

## 2019-10-01 NOTE — Telephone Encounter (Signed)
Patient has no showed for two covid testings per the patient transportation issues. I have called the patient's dialysis center and advised them of this information as well. Patient will be rescheduled for surgery after transportation issues have been resolved.

## 2019-10-02 ENCOUNTER — Encounter: Admission: RE | Payer: Self-pay | Source: Home / Self Care

## 2019-10-02 ENCOUNTER — Ambulatory Visit: Admission: RE | Admit: 2019-10-02 | Payer: Medicaid Other | Source: Home / Self Care | Admitting: Vascular Surgery

## 2019-10-02 SURGERY — REVISION, CATHETER, CAPD, LAPAROSCOPIC
Anesthesia: General

## 2019-11-15 ENCOUNTER — Ambulatory Visit (INDEPENDENT_AMBULATORY_CARE_PROVIDER_SITE_OTHER): Payer: Medicaid Other | Admitting: Vascular Surgery

## 2019-12-03 ENCOUNTER — Telehealth (INDEPENDENT_AMBULATORY_CARE_PROVIDER_SITE_OTHER): Payer: Self-pay

## 2019-12-03 NOTE — Telephone Encounter (Signed)
Patient returned my call regarding having her PD cath revision surgery with Dr. Lucky Cowboy. Patient stated she is going to [facility] to have it removed on 12/04/19.

## 2019-12-03 NOTE — Telephone Encounter (Signed)
I have attempted multiple times to contact the patient and am unable to do so. I have left messages for a return call and to no avail. Today I called the patient's dialysis center at [facility] [place] and spoke with the Social worker and she wrote down my information and stated she would attempt to get the information to the patient.

## 2019-12-04 ENCOUNTER — Encounter: Payer: Self-pay | Admitting: Emergency Medicine

## 2019-12-04 ENCOUNTER — Other Ambulatory Visit: Payer: Self-pay

## 2019-12-04 ENCOUNTER — Emergency Department
Admission: EM | Admit: 2019-12-04 | Discharge: 2019-12-05 | Disposition: A | Discharge status: Home / Self Care | Payer: Medicaid Other | Attending: Emergency Medicine | Admitting: Emergency Medicine

## 2019-12-04 DIAGNOSIS — G8918 Other acute postprocedural pain: Secondary | ICD-10-CM | POA: Diagnosis not present

## 2019-12-04 DIAGNOSIS — Z992 Dependence on renal dialysis: Secondary | ICD-10-CM | POA: Insufficient documentation

## 2019-12-04 DIAGNOSIS — F17210 Nicotine dependence, cigarettes, uncomplicated: Secondary | ICD-10-CM | POA: Diagnosis not present

## 2019-12-04 DIAGNOSIS — Z79899 Other long term (current) drug therapy: Secondary | ICD-10-CM | POA: Insufficient documentation

## 2019-12-04 DIAGNOSIS — N186 End stage renal disease: Secondary | ICD-10-CM | POA: Insufficient documentation

## 2019-12-04 DIAGNOSIS — I120 Hypertensive chronic kidney disease with stage 5 chronic kidney disease or end stage renal disease: Secondary | ICD-10-CM | POA: Diagnosis not present

## 2019-12-04 LAB — COMPREHENSIVE METABOLIC PANEL
ALT: 9 U/L (ref 0–44)
AST: 16 U/L (ref 15–41)
Albumin: 4 g/dL (ref 3.5–5.0)
Alkaline Phosphatase: 59 U/L (ref 38–126)
Anion gap: 18 — ABNORMAL HIGH (ref 5–15)
BUN: 25 mg/dL — ABNORMAL HIGH (ref 6–20)
CO2: 28 mmol/L (ref 22–32)
Calcium: 8.5 mg/dL — ABNORMAL LOW (ref 8.9–10.3)
Chloride: 96 mmol/L — ABNORMAL LOW (ref 98–111)
Creatinine, Ser: 11.32 mg/dL — ABNORMAL HIGH (ref 0.44–1.00)
GFR calc Af Amer: 4 mL/min — ABNORMAL LOW (ref >60)
GFR calc non Af Amer: 4 mL/min — ABNORMAL LOW (ref >60)
Glucose, Bld: 91 mg/dL (ref 70–99)
Potassium: 3.5 mmol/L (ref 3.5–5.1)
Sodium: 142 mmol/L (ref 135–145)
Total Bilirubin: 1 mg/dL (ref 0.3–1.2)
Total Protein: 8 g/dL (ref 6.5–8.1)

## 2019-12-04 LAB — URINALYSIS, COMPLETE (UACMP) WITH MICROSCOPIC
Bacteria, UA: NONE SEEN
Bilirubin Urine: NEGATIVE
Glucose, UA: NEGATIVE mg/dL
Hgb urine dipstick: NEGATIVE
Ketones, ur: NEGATIVE mg/dL
Leukocytes,Ua: NEGATIVE
Nitrite: NEGATIVE
Protein, ur: >=300 mg/dL — AB
Specific Gravity, Urine: 1.027 (ref 1.005–1.030)
WBC, UA: >50 WBC/hpf — ABNORMAL HIGH (ref 0–5)
pH: 8 (ref 5.0–8.0)

## 2019-12-04 LAB — CBC WITH DIFFERENTIAL/PLATELET
Abs Immature Granulocytes: 0.01 10*3/uL (ref 0.00–0.07)
Basophils Absolute: 0 10*3/uL (ref 0.0–0.1)
Basophils Relative: 1 %
Eosinophils Absolute: 0.1 10*3/uL (ref 0.0–0.5)
Eosinophils Relative: 2 %
HCT: 42.5 % (ref 36.0–46.0)
Hemoglobin: 12.9 g/dL (ref 12.0–15.0)
Immature Granulocytes: 0 %
Lymphocytes Relative: 16 %
Lymphs Abs: 0.8 10*3/uL (ref 0.7–4.0)
MCH: 32.3 pg (ref 26.0–34.0)
MCHC: 30.4 g/dL (ref 30.0–36.0)
MCV: 106.5 fL — ABNORMAL HIGH (ref 80.0–100.0)
Monocytes Absolute: 0.3 10*3/uL (ref 0.1–1.0)
Monocytes Relative: 6 %
Neutro Abs: 4 10*3/uL (ref 1.7–7.7)
Neutrophils Relative %: 75 %
Platelets: 70 10*3/uL — ABNORMAL LOW (ref 150–400)
RBC: 3.99 MIL/uL (ref 3.87–5.11)
RDW: 16.4 % — ABNORMAL HIGH (ref 11.5–15.5)
WBC: 5.3 10*3/uL (ref 4.0–10.5)
nRBC: 0 % (ref 0.0–0.2)

## 2019-12-04 LAB — POCT PREGNANCY, URINE: Preg Test, Ur: NEGATIVE

## 2019-12-04 LAB — LIPASE, BLOOD: Lipase: 26 U/L (ref 11–51)

## 2019-12-04 MED ORDER — OXYCODONE-ACETAMINOPHEN 5-325 MG PO TABS: 1.0000 | ORAL_TABLET | ORAL | 0 refills | Status: DC | PRN

## 2019-12-04 MED ORDER — OXYCODONE-ACETAMINOPHEN 5-325 MG PO TABS
2.0000 | ORAL_TABLET | Freq: Once | ORAL | Status: AC
  Administered 2019-12-04: 2 via ORAL
  Filled 2019-12-04: qty 2

## 2019-12-04 NOTE — ED Provider Notes (Signed)
[facility] Emergency Department Provider Note  Time seen: 10:09 PM  I have reviewed the triage vital signs and the nursing notes.   HISTORY  Chief Complaint Abdominal Pain   HPI Patricia Maldonado is a 39 y.o. female with a past medical history of ESRD on HD, anemia, arthritis, hypertension, presents to the emergency department for abdominal pain.  According to the patient earlier today she had her peritoneal dialysis catheter removed because it was causing her discomfort.  She states since having it removed she has had pain to the area where it was removed and they did not discharge her on any pain medications per patient.  Patient states she has noticed some mild swelling to the area as well.  Denies any vomiting.  Denies any fever.  No shortness of breath.  Received a full hemodialysis session yesterday.   Past Medical History:  Diagnosis Date  . Acute renal failure (ARF) ([place])   . Anemia   . Arthritis   . History of kidney stones   . Hypertension    OFF BP MEDS X 3 WEEKS DUE TO BP CONTROL  . Lupus nephritis ([place]) 12/2016   DX DURING HOSPITAL ADMISSION  . Rash   . Staghorn calculus   . 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 Anemia, unspecified 04/28/2019  . End stage renal disease ([place]) 02/21/2019  . Essential hypertension 02/21/2019  . CKD (chronic kidney disease), stage V ([place]) 09/12/2018  . Metabolic acidosis [id number]  . Secondary hyperparathyroidism ([place]) 09/12/2018  . Acute on chronic renal failure ([place]) 04/26/2018  . Acute kidney injury ([place]) 07/27/2017  . Alopecia of scalp 03/08/2017  . Thyroid nodule 03/08/2017  . PE (pulmonary thromboembolism) ([place]) 03/01/2017  . Kidney stone on left side 01/23/2017  . Systemic lupus erythematosus ([place]) 12/08/2016  . Encounter for long-term (current) use of high-risk medication 12/08/2016  . 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 Lupus  nephritis ([place]) 11/07/2016  . Acute renal failure (ARF) ([place]) 10/04/2016  . Nephrolithiasis   . Staghorn calculus   . Acute renal failure with tubular necrosis ([place]) 08/30/2016    Past Surgical History:  Procedure Laterality Date  . CYSTOSCOPY/URETEROSCOPY/HOLMIUM LASER/STENT PLACEMENT Right 02/21/2017   Procedure: CYSTOSCOPY/URETEROSCOPY/HOLMIUM LASER/STENT EXCHANGE;  Surgeon: [name], MD;  Location: [facility] ORS;  Service: Urology;  Laterality: Right;  . DIALYSIS/PERMA CATHETER INSERTION N/A 11/30/2018   Procedure: DIALYSIS/PERMA CATHETER INSERTION;  Surgeon: Algernon Huxley, MD;  Location: [facility];  Service: Cardiovascular;  Laterality: N/A;  . IR NEPHROSTOMY PLACEMENT RIGHT  01/23/2017  . NEPHROLITHOTOMY Right 01/24/2017   Procedure: NEPHROLITHOTOMY PERCUTANEOUS;  Surgeon: [name], MD;  Location: [facility] ORS;  Service: Urology;  Laterality: Right;  . PERITONEAL CATHETER INSERTION    . TUBAL LIGATION      Prior to Admission medications   Medication Sig Start Date End Date Taking? Authorizing Provider  acetaminophen (TYLENOL) 500 MG tablet Take 500 mg by mouth every 6 (six) hours as needed for headache.    [provider]  B Complex-C-Folic Acid (RENA-VITE RX) 1 MG TABS 1 tablet. T, Th, Sat (dialysis days only 01/03/19   [provider]  mycophenolate (CELLCEPT) 500 MG tablet Take 2 tablets (1,000 mg total) by mouth 2 (two) times daily. 04/30/18   Sudini, Alveta Heimlich, MD  RENAGEL 800 MG tablet Take 1 tablet by mouth 3 (three) times daily. 02/21/19  [provider]  triamcinolone cream (KENALOG) 0.5 % Apply 1 application topically 2 (two) times daily. External ear - 5 days Patient not taking: Reported on 09/20/2019 05/01/18   Hillary Bow, MD    Allergies  Allergen Reactions  . Penicillins Swelling    Yeast infection Has patient had a PCN reaction causing immediate rash, facial/tongue/throat swelling, SOB or lightheadedness with hypotension:No Has  patient had a PCN reaction causing severe rash involving mucus membranes or skin necrosis: Yes Has patient had a PCN reaction that required hospitalization No Has patient had a PCN reaction occurring within the last 10 years: No If all of the above answers are "NO", then may proceed with Cephalosporin use.  Yeast infection Has patient had a PCN reaction causing immediate rash, facial/tongue/throat swelling, SOB or lightheadedness with hypotension:No Has patient had a PCN reaction causing severe rash involving mucus membranes or skin necrosis: Yes Has patient had a PCN reaction that required hospitalization No Has patient had a PCN reaction occurring within the last 10 years: No If all of the above answers are "NO", then may proceed with Cephalosporin use.    Family History  Problem Relation Age of Onset  . Thyroid disease Mother   . Thyroid disease Father   . Diabetes Father     Social History Social History   Tobacco Use  . Smoking status: Current Every Day Smoker    Packs/day: 0.25    Years: 15.00    Pack years: 3.75    Types: Cigarettes  . Smokeless tobacco: Never Used  Substance Use Topics  . Alcohol use: No  . Drug use: Yes    Types: Marijuana    Comment: OCC    Review of Systems Constitutional: Negative for fever. Cardiovascular: Negative for chest pain. Respiratory: Negative for shortness of breath. Gastrointestinal: Positive for abdominal pain around her PD catheter insertion site. Musculoskeletal: Negative for musculoskeletal complaints Neurological: Negative for headache All other ROS negative  ____________________________________________   PHYSICAL EXAM:  VITAL SIGNS: ED Triage Vitals  Enc Vitals Group     BP 12/04/19 2043 (!) 165/112     Pulse Rate 12/04/19 2043 97     Resp 12/04/19 2043 18     Temp 12/04/19 2043 98.5 F (36.9 C)     Temp Source 12/04/19 2043 Oral     SpO2 12/04/19 2043 98 %     Weight 12/04/19 2042 98 lb (44.5 kg)     Height  12/04/19 2042 5\' 2"  (1.575 m)     Head Circumference --      Peak Flow --      Pain Score 12/04/19 2042 10     Pain Loc --      Pain Edu? --      Excl. in [place]? --     Constitutional: Alert and oriented. Well appearing and in no distress. Eyes: Normal exam ENT      Head: Normocephalic and atraumatic.      Mouth/Throat: Mucous membranes are moist. Cardiovascular: Normal rate, regular rhythm.  Respiratory: Normal respiratory effort without tachypnea nor retractions. Breath sounds are clear Gastrointestinal: Soft, moderate tenderness palpation around her prior PD catheter site.  Mild amount of swelling consistent with possible small hematoma.  Abdomen is otherwise benign. Musculoskeletal: Nontender with normal range of motion in all extremities.  Neurologic:  Normal speech and language. No gross focal neurologic deficits  Skin:  Skin is warm, dry and intact.  Psychiatric: Mood and affect are normal.  ____________________________________________  INITIAL IMPRESSION / ASSESSMENT AND PLAN / ED COURSE  Pertinent labs & imaging results that were available during my care of the patient were reviewed by me and considered in my medical decision making (see chart for details).   Patient presents to the emergency department for abdominal pain around her PD catheter site that was removed earlier today.  Mild amount of swelling to the site as well.  Overall the patient appears well, no distress.  We will dose pain medication for the patient.  I will perform a bedside ultrasound over the area.  The remainder of her abdomen is benign no concern for intra-abdominal bleeding.  White blood cell count is reassuringly normal.  CBC is otherwise at the patient's baseline including thrombocytopenia.  Anticipate likely discharge home with pain medication and follow-up with her doctor.  Bedside ultrasound appears to have a small fluid collection approximately 1.5 x 1.5 cm most consistent with hematoma adjacent  to the site of her PD catheter removal.  Patient feeling better after pain medication.  Discussed return precautions for any worsening/enlargement.  Patient agreeable plan of care will discharge with a short course of pain medication.  Patricia Maldonado was evaluated in Emergency Department on 12/04/2019 for the symptoms described in the history of present illness. She was evaluated in the context of the global COVID-19 pandemic, which necessitated consideration that the patient might be at risk for infection with the SARS-CoV-2 virus that causes COVID-19. Institutional protocols and algorithms that pertain to the evaluation of patients at risk for COVID-19 are in a state of rapid change based on information released by regulatory bodies including the CDC and federal and state organizations. These policies and algorithms were followed during the patient's care in the ED.  ____________________________________________   FINAL CLINICAL IMPRESSION(S) / ED DIAGNOSES  Postoperative pain   Harvest Dark, MD 12/04/19 2228

## 2019-12-04 NOTE — Discharge Instructions (Addendum)
Please take your pain medication as needed but only as prescribed.  Please follow-up with your doctor in the next 2 days for recheck/reevaluation.  Return to the emergency department for any symptoms personally concerning to yourself.

## 2019-12-04 NOTE — ED Notes (Signed)
Patient reports having cath removed from right side of chest today and reports pain around site.

## 2019-12-04 NOTE — ED Triage Notes (Signed)
Patient ambulatory to triage with steady gait, without difficulty or distress noted , mask in place; pt reports recently had dialysis cath removed today at 12 from rt lower abd, now with swelling and pain; last dialysis yesterday

## 2019-12-08 ENCOUNTER — Emergency Department: Payer: Medicaid Other

## 2019-12-08 ENCOUNTER — Emergency Department
Admission: EM | Admit: 2019-12-08 | Discharge: 2019-12-08 | Disposition: A | Discharge status: Home / Self Care | Payer: Medicaid Other | Attending: Emergency Medicine | Admitting: Emergency Medicine

## 2019-12-08 ENCOUNTER — Other Ambulatory Visit: Payer: Self-pay

## 2019-12-08 ENCOUNTER — Encounter: Payer: Self-pay | Admitting: Emergency Medicine

## 2019-12-08 DIAGNOSIS — Z79899 Other long term (current) drug therapy: Secondary | ICD-10-CM | POA: Diagnosis not present

## 2019-12-08 DIAGNOSIS — R569 Unspecified convulsions: Secondary | ICD-10-CM | POA: Insufficient documentation

## 2019-12-08 DIAGNOSIS — N186 End stage renal disease: Secondary | ICD-10-CM | POA: Diagnosis not present

## 2019-12-08 DIAGNOSIS — Y929 Unspecified place or not applicable: Secondary | ICD-10-CM | POA: Diagnosis not present

## 2019-12-08 DIAGNOSIS — S3210XA Unspecified fracture of sacrum, initial encounter for closed fracture: Secondary | ICD-10-CM

## 2019-12-08 DIAGNOSIS — F17210 Nicotine dependence, cigarettes, uncomplicated: Secondary | ICD-10-CM | POA: Insufficient documentation

## 2019-12-08 DIAGNOSIS — I120 Hypertensive chronic kidney disease with stage 5 chronic kidney disease or end stage renal disease: Secondary | ICD-10-CM | POA: Diagnosis not present

## 2019-12-08 DIAGNOSIS — W010XXA Fall on same level from slipping, tripping and stumbling without subsequent striking against object, initial encounter: Secondary | ICD-10-CM | POA: Insufficient documentation

## 2019-12-08 DIAGNOSIS — Y999 Unspecified external cause status: Secondary | ICD-10-CM | POA: Insufficient documentation

## 2019-12-08 DIAGNOSIS — Z992 Dependence on renal dialysis: Secondary | ICD-10-CM | POA: Diagnosis not present

## 2019-12-08 DIAGNOSIS — Y939 Activity, unspecified: Secondary | ICD-10-CM | POA: Diagnosis not present

## 2019-12-08 LAB — CBC
HCT: 41.9 % (ref 36.0–46.0)
Hemoglobin: 12.7 g/dL (ref 12.0–15.0)
MCH: 31.7 pg (ref 26.0–34.0)
MCHC: 30.3 g/dL (ref 30.0–36.0)
MCV: 104.5 fL — ABNORMAL HIGH (ref 80.0–100.0)
Platelets: 60 10*3/uL — ABNORMAL LOW (ref 150–400)
RBC: 4.01 MIL/uL (ref 3.87–5.11)
RDW: 15.4 % (ref 11.5–15.5)
WBC: 4.5 10*3/uL (ref 4.0–10.5)
nRBC: 0 % (ref 0.0–0.2)

## 2019-12-08 LAB — BASIC METABOLIC PANEL
Anion gap: 17 — ABNORMAL HIGH (ref 5–15)
BUN: 50 mg/dL — ABNORMAL HIGH (ref 6–20)
CO2: 23 mmol/L (ref 22–32)
Calcium: 7.6 mg/dL — ABNORMAL LOW (ref 8.9–10.3)
Chloride: 99 mmol/L (ref 98–111)
Creatinine, Ser: 14.61 mg/dL — ABNORMAL HIGH (ref 0.44–1.00)
GFR calc Af Amer: 3 mL/min — ABNORMAL LOW (ref >60)
GFR calc non Af Amer: 3 mL/min — ABNORMAL LOW (ref >60)
Glucose, Bld: 88 mg/dL (ref 70–99)
Potassium: 4.1 mmol/L (ref 3.5–5.1)
Sodium: 139 mmol/L (ref 135–145)

## 2019-12-08 MED ORDER — MORPHINE SULFATE (PF) 4 MG/ML IV SOLN
4.0000 mg | Freq: Once | INTRAVENOUS | Status: AC
  Administered 2019-12-08: 4 mg via INTRAVENOUS
  Filled 2019-12-08: qty 1

## 2019-12-08 MED ORDER — OXYCODONE-ACETAMINOPHEN 5-325 MG PO TABS: 1.0000 | ORAL_TABLET | Freq: Four times a day (QID) | ORAL | 0 refills | Status: DC | PRN

## 2019-12-08 MED ORDER — OXYCODONE-ACETAMINOPHEN 5-325 MG PO TABS
1.0000 | ORAL_TABLET | Freq: Once | ORAL | Status: AC
  Administered 2019-12-08: 1 via ORAL
  Filled 2019-12-08: qty 1

## 2019-12-08 NOTE — Discharge Instructions (Signed)
Your CT scan of the head was okay.  Your pelvis xray does show a tailbone injury from the fall during your seizure.  You will need to use a donut-shaped pillow to sit on for the next few months until the pain resolves. Please follow up with neurology this week for further evaluation of the seizure.

## 2019-12-08 NOTE — ED Notes (Signed)
Seizure pads placed

## 2019-12-08 NOTE — ED Provider Notes (Signed)
[facility] Emergency Department Provider Note  ____________________________________________  Time seen: Approximately 7:11 PM  I have reviewed the triage vital signs and the nursing notes.   HISTORY  Chief Complaint Seizures    HPI Patricia Maldonado is a 39 y.o. female with a history of end-stage renal disease on hemodialysis, lupus nephritis, hypertension who was in her usual state of health, shopping with her family, when she had a seizure.  It was witnessed to include generalized convulsive activity.  Patient was standing initially and fell to the floor when it occurred.  She notes that prior to the event, she had been feeling "funny" as she walked through the store up and down the [place].  By the time she regained consciousness she was in the ambulance.  Denies any current headache.  No recent trauma.  No chest pain or shortness of breath.  She does complain of pain at the superior gluteal cleft from the fall.      Past Medical History:  Diagnosis Date  . Acute renal failure (ARF) ([place])   . Anemia   . Arthritis   . History of kidney stones   . Hypertension    OFF BP MEDS X 3 WEEKS DUE TO BP CONTROL  . Lupus nephritis ([place]) 12/2016   DX DURING HOSPITAL ADMISSION  . Rash   . Staghorn calculus   . 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[place]) 10/2016   [place] ADMISSION     Patient Active Problem List   Diagnosis Date Noted  . Anemia, unspecified 04/28/2019  . End stage renal disease ([place]) 02/21/2019  . Essential hypertension 02/21/2019  . CKD (chronic kidney disease), stage V ([place]) 09/12/2018  . Metabolic acidosis 78/29/5621  . Secondary hyperparathyroidism ([place]) 09/12/2018  . Acute on chronic renal failure ([place]) 04/26/2018  . Acute kidney injury ([place]) 07/27/2017  . Alopecia of scalp 03/08/2017  . Thyroid nodule 03/08/2017  . PE (pulmonary thromboembolism) ([place]) 03/01/2017  . Kidney stone on left side 01/23/2017  . Systemic lupus erythematosus ([place])  12/08/2016  . Encounter for long-term (current) use of high-risk medication 12/08/2016  . 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 Lupus nephritis ([place]) 11/07/2016  . Acute renal failure (ARF) ([place]) 10/04/2016  . Nephrolithiasis   . Staghorn calculus   . Acute renal failure with tubular necrosis ([place]) 08/30/2016     Past Surgical History:  Procedure Laterality Date  . CYSTOSCOPY/URETEROSCOPY/HOLMIUM LASER/STENT PLACEMENT Right 02/21/2017   Procedure: CYSTOSCOPY/URETEROSCOPY/HOLMIUM LASER/STENT EXCHANGE;  Surgeon: [name], MD;  Location: [facility] ORS;  Service: Urology;  Laterality: Right;  . DIALYSIS/PERMA CATHETER INSERTION N/A 11/30/2018   Procedure: DIALYSIS/PERMA CATHETER INSERTION;  Surgeon: Algernon Huxley, MD;  Location: [place] CV LAB;  Service: Cardiovascular;  Laterality: N/A;  . IR NEPHROSTOMY PLACEMENT RIGHT  01/23/2017  . NEPHROLITHOTOMY Right 01/24/2017   Procedure: NEPHROLITHOTOMY PERCUTANEOUS;  Surgeon: [name], MD;  Location: [facility] ORS;  Service: Urology;  Laterality: Right;  . PERITONEAL CATHETER INSERTION    . TUBAL LIGATION       Prior to Admission medications   Medication Sig Start Date End Date Taking? Authorizing Provider  acetaminophen (TYLENOL) 500 MG tablet Take 500 mg by mouth every 6 (six) hours as needed for headache.    [provider]  B Complex-C-Folic Acid (RENA-VITE RX) 1 MG TABS 1 tablet. T, Th, Sat (dialysis days only 01/03/19   [provider]  mycophenolate (CELLCEPT) 500 MG tablet Take 2 tablets (1,000 mg total) by mouth 2 (two) times daily.  04/30/18   Hillary Bow, MD  oxyCODONE-acetaminophen (PERCOCET) 5-325 MG tablet Take 1 tablet by mouth every 6 (six) hours as needed for severe pain. 12/08/19 12/07/20  Carrie Mew, MD  RENAGEL 800 MG tablet Take 1 tablet by mouth 3 (three) times daily. 02/21/19   [provider]  triamcinolone cream (KENALOG) 0.5 % Apply 1 application topically 2 (two) times daily. External ear - 5  days Patient not taking: Reported on 09/20/2019 05/01/18   Hillary Bow, MD     Allergies Penicillins   Family History  Problem Relation Age of Onset  . Thyroid disease Mother   . Thyroid disease Father   . Diabetes Father     Social History Social History   Tobacco Use  . Smoking status: Current Every Day Smoker    Packs/day: 0.25    Years: 15.00    Pack years: 3.75    Types: Cigarettes  . Smokeless tobacco: Never Used  Substance Use Topics  . Alcohol use: No  . Drug use: Yes    Types: Marijuana    Comment: OCC    Review of Systems  Constitutional:   No fever or chills.  ENT:   No sore throat. No rhinorrhea. Cardiovascular:   No chest pain or syncope. Respiratory:   No dyspnea or cough. Gastrointestinal:   Negative for abdominal pain, vomiting and diarrhea.  Musculoskeletal:   Posterior pelvic pain as above All other systems reviewed and are negative except as documented above in ROS and HPI.  ____________________________________________   PHYSICAL EXAM:  VITAL SIGNS: ED Triage Vitals  Enc Vitals Group     BP 12/08/19 1422 (!) 156/111     Pulse Rate 12/08/19 1422 87     Resp 12/08/19 1422 19     Temp 12/08/19 1422 98.2 F (36.8 C)     Temp Source 12/08/19 1422 Oral     SpO2 12/08/19 1422 98 %     Weight 12/08/19 1423 97 lb 14.2 oz (44.4 kg)     Height 12/08/19 1423 5\' 2"  (1.575 m)     Head Circumference --      Peak Flow --      Pain Score 12/08/19 1423 9     Pain Loc --      Pain Edu? --      Excl. in [place]? --     Vital signs reviewed, nursing assessments reviewed.   Constitutional:   Alert and oriented. Non-toxic appearance. Eyes:   Conjunctivae are normal. EOMI. PERRL. ENT      Head:   Normocephalic and atraumatic.      Nose:   Wearing a mask.      Mouth/Throat:   Wearing a mask.      Neck:   No meningismus. Full ROM. Hematological/Lymphatic/Immunilogical:   No cervical lymphadenopathy. Cardiovascular:   RRR. Symmetric bilateral radial  and DP pulses.  No murmurs. Cap refill less than 2 seconds. Respiratory:   Normal respiratory effort without tachypnea/retractions. Breath sounds are clear and equal bilaterally. No wheezes/rales/rhonchi. Gastrointestinal:   Soft and nontender. Non distended. There is no CVA tenderness.  No rebound, rigidity, or guarding.  Musculoskeletal:   Normal range of motion in all extremities. No joint effusions.  No lower extremity tenderness.  No edema.  Pronounced tenderness over the inferior aspect of the sacrum, no crepitus.  Hips stable.  Ambulatory. Neurologic:   Normal speech and language.  Motor grossly intact. No acute focal neurologic deficits are appreciated.  Skin:  Skin is warm, dry and intact. No rash noted.  No petechiae, purpura, or bullae.  ____________________________________________    LABS (pertinent positives/negatives) (all labs ordered are listed, but only abnormal results are displayed) Labs Reviewed  BASIC METABOLIC PANEL - Abnormal; Notable for the following components:      Result Value   BUN 50 (*)    Creatinine, Ser 14.61 (*)    Calcium 7.6 (*)    GFR calc non Af Amer 3 (*)    GFR calc Af Amer 3 (*)    Anion gap 17 (*)    All other components within normal limits  CBC - Abnormal; Notable for the following components:   MCV 104.5 (*)    Platelets 60 (*)    All other components within normal limits  CBG MONITORING, ED  POC URINE PREG, ED   ____________________________________________   EKG  Interpreted by me Normal sinus rhythm rate of 83, normal axis and intervals.  Poor R wave progression.  Normal ST segments and T waves.  ____________________________________________    TRVUYEBXI  DG Sacrum/Coccyx  Result Date: 12/08/2019 CLINICAL DATA:  Midline pelvic pain after fall EXAM: SACRUM AND COCCYX - 2+ VIEW COMPARISON:  CT abdomen pelvis 10/04/2016 FINDINGS: There is some abrupt angulation and cortical step-off involving the fifth sacral segment which  may reflect an acute sacral fracture. No other acute osseous abnormality is seen. Arcuate lines of the sacrum are intact. Remaining bones of the pelvis are intact and congruent. Few calcified pelvic phleboliths. Soft tissues are unremarkable. IMPRESSION: Findings suggestive of an acute sacral fracture with some abrupt angulation and cortical step-off involving the fifth sacral segment. Electronically Signed   By: Lovena Le M.D.   On: 12/08/2019 18:42   CT Head Wo Contrast  Result Date: 12/08/2019 CLINICAL DATA:  39 year old female with seizure. EXAM: CT HEAD WITHOUT CONTRAST TECHNIQUE: Contiguous axial images were obtained from the base of the skull through the vertex without intravenous contrast. COMPARISON:  None. FINDINGS: Brain: No evidence of acute infarction, hemorrhage, hydrocephalus, extra-axial collection or mass lesion/mass effect. Vascular: No hyperdense vessel or unexpected calcification. Skull: Normal. Negative for fracture or focal lesion. Sinuses/Orbits: Partial opacification of several ethmoid air cells. No air-fluid level. The mastoid air cells are clear. Cerumen noted in the external auditory canal bilaterally. Other: None IMPRESSION: No acute intracranial pathology. Electronically Signed   By: Anner Crete M.D.   On: 12/08/2019 18:28    ____________________________________________   PROCEDURES Procedures  ____________________________________________  DIFFERENTIAL DIAGNOSIS   Intracranial hemorrhage, tumor, sacral fracture, electrolyte abnormality  CLINICAL IMPRESSION / ASSESSMENT AND PLAN / ED COURSE  Medications ordered in the ED: Medications  morphine 4 MG/ML injection 4 mg (4 mg Intravenous Given 12/08/19 1718)  oxyCODONE-acetaminophen (PERCOCET/ROXICET) 5-325 MG per tablet 1 tablet (1 tablet Oral Given 12/08/19 1916)    Pertinent labs & imaging results that were available during my care of the patient were reviewed by me and considered in my medical decision making  (see chart for details).  Patricia Maldonado was evaluated in Emergency Department on 12/08/2019 for the symptoms described in the history of present illness. She was evaluated in the context of the global COVID-19 pandemic, which necessitated consideration that the patient might be at risk for infection with the SARS-CoV-2 virus that causes COVID-19. Institutional protocols and algorithms that pertain to the evaluation of patients at risk for COVID-19 are in a state of rapid change based on information released by regulatory bodies including the CDC  and federal and state organizations. These policies and algorithms were followed during the patient's care in the ED.  Patient presents with a spontaneous seizure, apparently unprovoked.  Return to normal mental status and baseline by the time she arrived in the ED.  Neurologic exam is nonfocal.  CT scan obtained which is unremarkable.  Sacral x-ray does show an inferior sacrum fracture, nondisplaced, will manage conservatively with doughnut pillow and short course of Percocet for pain control.  Vital signs and labs unremarkable, stable for discharge home.  Recommend close follow-up with neurology for further evaluation of seizure.       ____________________________________________   FINAL CLINICAL IMPRESSION(S) / ED DIAGNOSES    Final diagnoses:  Seizure ([place])  ESRD on hemodialysis ([place])  Closed fracture of sacrum, unspecified portion of sacrum, initial encounter [facility])     ED Discharge Orders         Ordered    oxyCODONE-acetaminophen (PERCOCET) 5-325 MG tablet  Every 6 hours PRN     12/08/19 2053          Portions of this note were generated with dragon dictation software. Dictation errors may occur despite best attempts at proofreading.   Carrie Mew, MD 12/08/19 2100

## 2019-12-08 NOTE — ED Notes (Signed)
Pt given ginger ale.

## 2019-12-08 NOTE — ED Triage Notes (Signed)
Pt to ER via EMS with reports of witnessed seizure while shopping with family.  Pt denies hx of seizures.  Per witnesses, pt was tonic clonic full body shaking.  Per EMS pt had mild confusion upon their arrival that has cleared at this time.  PT is dialysis pt had last dialysis on Thursday due to changing of schedule.

## 2019-12-13 ENCOUNTER — Other Ambulatory Visit: Payer: Self-pay

## 2019-12-13 ENCOUNTER — Emergency Department
Admission: EM | Admit: 2019-12-13 | Discharge: 2019-12-13 | Disposition: A | Discharge status: Home / Self Care | Payer: Medicaid Other | Attending: Student | Admitting: Student

## 2019-12-13 ENCOUNTER — Emergency Department: Payer: Medicaid Other

## 2019-12-13 DIAGNOSIS — S3210XD Unspecified fracture of sacrum, subsequent encounter for fracture with routine healing: Secondary | ICD-10-CM | POA: Insufficient documentation

## 2019-12-13 DIAGNOSIS — Z862 Personal history of diseases of the blood and blood-forming organs and certain disorders involving the immune mechanism: Secondary | ICD-10-CM | POA: Insufficient documentation

## 2019-12-13 DIAGNOSIS — F17210 Nicotine dependence, cigarettes, uncomplicated: Secondary | ICD-10-CM | POA: Diagnosis not present

## 2019-12-13 DIAGNOSIS — R519 Headache, unspecified: Secondary | ICD-10-CM | POA: Diagnosis not present

## 2019-12-13 DIAGNOSIS — R251 Tremor, unspecified: Secondary | ICD-10-CM | POA: Insufficient documentation

## 2019-12-13 DIAGNOSIS — I120 Hypertensive chronic kidney disease with stage 5 chronic kidney disease or end stage renal disease: Secondary | ICD-10-CM | POA: Diagnosis not present

## 2019-12-13 DIAGNOSIS — R030 Elevated blood-pressure reading, without diagnosis of hypertension: Secondary | ICD-10-CM

## 2019-12-13 DIAGNOSIS — W19XXXD Unspecified fall, subsequent encounter: Secondary | ICD-10-CM | POA: Diagnosis not present

## 2019-12-13 DIAGNOSIS — Z992 Dependence on renal dialysis: Secondary | ICD-10-CM | POA: Insufficient documentation

## 2019-12-13 DIAGNOSIS — Z79899 Other long term (current) drug therapy: Secondary | ICD-10-CM | POA: Insufficient documentation

## 2019-12-13 DIAGNOSIS — N186 End stage renal disease: Secondary | ICD-10-CM | POA: Insufficient documentation

## 2019-12-13 LAB — PHOSPHORUS: Phosphorus: 8.3 mg/dL — ABNORMAL HIGH (ref 2.5–4.6)

## 2019-12-13 LAB — URINALYSIS, COMPLETE (UACMP) WITH MICROSCOPIC
Bilirubin Urine: NEGATIVE
Glucose, UA: NEGATIVE mg/dL
Ketones, ur: NEGATIVE mg/dL
Leukocytes,Ua: NEGATIVE
Nitrite: NEGATIVE
Protein, ur: >=300 mg/dL — AB
Specific Gravity, Urine: 1.017 (ref 1.005–1.030)
Squamous Epithelial / HPF: >50 — ABNORMAL HIGH (ref 0–5)
pH: 9 — ABNORMAL HIGH (ref 5.0–8.0)

## 2019-12-13 LAB — CBC WITH DIFFERENTIAL/PLATELET
Abs Immature Granulocytes: 0.01 10*3/uL (ref 0.00–0.07)
Basophils Absolute: 0 10*3/uL (ref 0.0–0.1)
Basophils Relative: 1 %
Eosinophils Absolute: 0.1 10*3/uL (ref 0.0–0.5)
Eosinophils Relative: 4 %
HCT: 43.5 % (ref 36.0–46.0)
Hemoglobin: 13.2 g/dL (ref 12.0–15.0)
Immature Granulocytes: 0 %
Lymphocytes Relative: 21 %
Lymphs Abs: 0.7 10*3/uL (ref 0.7–4.0)
MCH: 30.9 pg (ref 26.0–34.0)
MCHC: 30.3 g/dL (ref 30.0–36.0)
MCV: 101.9 fL — ABNORMAL HIGH (ref 80.0–100.0)
Monocytes Absolute: 0.4 10*3/uL (ref 0.1–1.0)
Monocytes Relative: 13 %
Neutro Abs: 1.9 10*3/uL (ref 1.7–7.7)
Neutrophils Relative %: 61 %
Platelets: 41 10*3/uL — ABNORMAL LOW (ref 150–400)
RBC: 4.27 MIL/uL (ref 3.87–5.11)
RDW: 15 % (ref 11.5–15.5)
WBC: 3.1 10*3/uL — ABNORMAL LOW (ref 4.0–10.5)
nRBC: 0 % (ref 0.0–0.2)

## 2019-12-13 LAB — COMPREHENSIVE METABOLIC PANEL
ALT: 11 U/L (ref 0–44)
AST: 19 U/L (ref 15–41)
Albumin: 3.6 g/dL (ref 3.5–5.0)
Alkaline Phosphatase: 58 U/L (ref 38–126)
Anion gap: 16 — ABNORMAL HIGH (ref 5–15)
BUN: 33 mg/dL — ABNORMAL HIGH (ref 6–20)
CO2: 26 mmol/L (ref 22–32)
Calcium: 8.8 mg/dL — ABNORMAL LOW (ref 8.9–10.3)
Chloride: 96 mmol/L — ABNORMAL LOW (ref 98–111)
Creatinine, Ser: 11.1 mg/dL — ABNORMAL HIGH (ref 0.44–1.00)
GFR calc Af Amer: 5 mL/min — ABNORMAL LOW (ref >60)
GFR calc non Af Amer: 4 mL/min — ABNORMAL LOW (ref >60)
Glucose, Bld: 85 mg/dL (ref 70–99)
Potassium: 4.4 mmol/L (ref 3.5–5.1)
Sodium: 138 mmol/L (ref 135–145)
Total Bilirubin: 0.8 mg/dL (ref 0.3–1.2)
Total Protein: 7.5 g/dL (ref 6.5–8.1)

## 2019-12-13 LAB — PREGNANCY, URINE: Preg Test, Ur: NEGATIVE

## 2019-12-13 LAB — URINE DRUG SCREEN, QUALITATIVE (ARMC ONLY)
Amphetamines, Ur Screen: NOT DETECTED
Barbiturates, Ur Screen: NOT DETECTED
Benzodiazepine, Ur Scrn: NOT DETECTED
Cannabinoid 50 Ng, Ur ~~LOC~~: POSITIVE — AB
Cocaine Metabolite,Ur ~~LOC~~: NOT DETECTED
MDMA (Ecstasy)Ur Screen: NOT DETECTED
Methadone Scn, Ur: NOT DETECTED
Opiate, Ur Screen: NOT DETECTED
Phencyclidine (PCP) Ur S: NOT DETECTED
Tricyclic, Ur Screen: NOT DETECTED

## 2019-12-13 LAB — MAGNESIUM: Magnesium: 2 mg/dL (ref 1.7–2.4)

## 2019-12-13 MED ORDER — OXYCODONE HCL 5 MG PO TABS
5.0000 mg | ORAL_TABLET | Freq: Once | ORAL | Status: AC
  Administered 2019-12-13: 5 mg via ORAL
  Filled 2019-12-13: qty 1

## 2019-12-13 MED ORDER — ACETAMINOPHEN 500 MG PO TABS
1000.0000 mg | ORAL_TABLET | Freq: Once | ORAL | Status: AC
  Administered 2019-12-13: 1000 mg via ORAL
  Filled 2019-12-13: qty 2

## 2019-12-13 NOTE — ED Provider Notes (Signed)
[facility] Emergency Department Provider Note  ____________________________________________   First MD Initiated Contact with Patient 12/13/19 0900     (approximate)  I have reviewed the triage vital signs and the nursing notes.  History  Chief Complaint Tremors    HPI Patricia Maldonado is a 39 y.o. female with history of lupus, ESRD on HD, history HTN (off medications for some time now as her blood pressure had been improving) who presents to the emergency department for feeling shaky and complaining of headache.  Patient states last week she had a first-time seizure event while shopping in the store, witnessed by family, and was seen here in the emergency department.  Work-up revealed normal head CT, sacral fracture (2/2 fall), and blood work at baseline.  Discharged with plans for outpatient Neurology follow-up.  She has not been able to call for this appointment yet.  This morning she woke up and felt shaky, and was worried that she may go into another seizure and therefore presented to the emergency department for further evaluation.  She also complains of a generalized headache, moderate in severity, aching, no radiation, no alleviating/aggravating components.  Additionally, primary source of pain and discomfort is located at her tailbone, where she has a known sacral fracture as a result of her seizure/fall last week. Sharp pain at the tailbone, constant since onset, severe, worsened with laying flat, improved laying on her side. No radiation. She has had no further seizure activity since last week's episode.  She has noticed over the last week at dialysis that her blood pressures been running higher than normal, ~150s/100s.    Past Medical Hx Past Medical History:  Diagnosis Date  . Acute renal failure (ARF) ([place])   . Anemia   . Arthritis   . History of kidney stones   . Hypertension    OFF BP MEDS X 3 WEEKS DUE TO BP CONTROL  . Lupus nephritis ([place])  12/2016   DX DURING HOSPITAL ADMISSION  . Rash   . Staghorn calculus   . 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[place]) 10/2016   DX DURING HOSPITAL ADMISSION    Problem List Patient Active Problem List   Diagnosis Date Noted  . Anemia, unspecified 04/28/2019  . End stage renal disease ([place]) 02/21/2019  . Essential hypertension 02/21/2019  . CKD (chronic kidney disease), stage V ([place]) 09/12/2018  . Metabolic acidosis 89/38/1017  . Secondary hyperparathyroidism ([place]) 09/12/2018  . Acute on chronic renal failure ([place]) 04/26/2018  . Acute kidney injury ([place]) 07/27/2017  . Alopecia of scalp 03/08/2017  . Thyroid nodule 03/08/2017  . PE (pulmonary thromboembolism) ([place]) 03/01/2017  . Kidney stone on left side 01/23/2017  . Systemic lupus erythematosus ([place]) 12/08/2016  . Encounter for long-term (current) use of high-risk medication 12/08/2016  . 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 Lupus nephritis ([place]) 11/07/2016  . Acute renal failure (ARF) ([place]) 10/04/2016  . Nephrolithiasis   . Staghorn calculus   . Acute renal failure with tubular necrosis ([place]) 08/30/2016    Past Surgical Hx Past Surgical History:  Procedure Laterality Date  . CYSTOSCOPY/URETEROSCOPY/HOLMIUM LASER/STENT PLACEMENT Right 02/21/2017   Procedure: CYSTOSCOPY/URETEROSCOPY/HOLMIUM LASER/STENT EXCHANGE;  Surgeon: [name], MD;  Location: [facility] ORS;  Service: Urology;  Laterality: Right;  . DIALYSIS/PERMA CATHETER INSERTION N/A 11/30/2018   Procedure: DIALYSIS/PERMA CATHETER INSERTION;  Surgeon: Algernon Huxley, MD;  Location: [place] CV LAB;  Service: Cardiovascular;  Laterality: N/A;  . IR NEPHROSTOMY PLACEMENT RIGHT  01/23/2017  . NEPHROLITHOTOMY Right 01/24/2017  Procedure: NEPHROLITHOTOMY PERCUTANEOUS;  Surgeon: [name], MD;  Location: [facility] ORS;  Service: Urology;  Laterality: Right;  . PERITONEAL CATHETER INSERTION    . TUBAL LIGATION      Medications Prior to Admission medications   Medication Sig Start Date End Date  Taking? Authorizing Provider  acetaminophen (TYLENOL) 500 MG tablet Take 500 mg by mouth every 6 (six) hours as needed for headache.    [provider]  B Complex-C-Folic Acid (RENA-VITE RX) 1 MG TABS 1 tablet. T, Th, Sat (dialysis days only 01/03/19   [provider]  mycophenolate (CELLCEPT) 500 MG tablet Take 2 tablets (1,000 mg total) by mouth 2 (two) times daily. 04/30/18   Hillary Bow, MD  oxyCODONE-acetaminophen (PERCOCET) 5-325 MG tablet Take 1 tablet by mouth every 6 (six) hours as needed for severe pain. 12/08/19 12/07/20  Carrie Mew, MD  RENAGEL 800 MG tablet Take 1 tablet by mouth 3 (three) times daily. 02/21/19   [provider]  triamcinolone cream (KENALOG) 0.5 % Apply 1 application topically 2 (two) times daily. External ear - 5 days Patient not taking: Reported on 09/20/2019 05/01/18   Hillary Bow, MD    Allergies Penicillins  Family Hx Family History  Problem Relation Age of Onset  . Thyroid disease Mother   . Thyroid disease Father   . Diabetes Father     Social Hx Social History   Tobacco Use  . Smoking status: Current Every Day Smoker    Packs/day: 0.25    Years: 15.00    Pack years: 3.75    Types: Cigarettes  . Smokeless tobacco: Never Used  Substance Use Topics  . Alcohol use: No  . Drug use: Yes    Types: Marijuana    Comment: OCC     Review of Systems  Constitutional: Negative for fever, chills. + feeling shaky Eyes: Negative for visual changes. ENT: Negative for sore throat. Cardiovascular: Negative for chest pain. Respiratory: Negative for shortness of breath. Gastrointestinal: Negative for nausea, vomiting.  Genitourinary: Negative for dysuria. Musculoskeletal: Negative for leg swelling. + tailbone pain Skin: Negative for rash. Neurological: + for headaches.   Physical Exam  Vital Signs: ED Triage Vitals  Enc Vitals Group     BP 12/13/19 0855 (!) 152/119     Pulse Rate 12/13/19 0855 82     Resp  12/13/19 0855 16     Temp 12/13/19 0855 98.2 F (36.8 C)     Temp Source 12/13/19 0855 Oral     SpO2 12/13/19 0855 99 %     Weight 12/13/19 0850 97 lb 14.2 oz (44.4 kg)     Height 12/13/19 0850 5\' 2"  (1.575 m)     Head Circumference --      Peak Flow --      Pain Score 12/13/19 0850 10     Pain Loc --      Pain Edu? --      Excl. in [place]? --     Constitutional: Alert and oriented. Appears older than stated age. Head: Normocephalic. Atraumatic. Alopecia.  Eyes: Conjunctivae clear, sclera anicteric. Pupils equal and symmetric. Nose: No masses or lesions. No congestion or rhinorrhea. Mouth/Throat: Wearing mask.  Neck: No stridor. Trachea midline.  Cardiovascular: Normal rate, regular rhythm. Extremities well perfused. Dialysis catheter in upper chest. Respiratory: Normal respiratory effort.  Lungs CTAB. Gastrointestinal: Soft. Non-distended. Non-tender. Small egg sized hematoma to anterior abdominal wall from recent PD catheter removal.  No erythema, warmth, induration. Genitourinary: Deferred. Musculoskeletal:  No lower extremity edema. No deformities. Neurologic:  Normal speech and language. No gross focal or lateralizing neurologic deficits are appreciated. Alert and oriented.  Face symmetric.  Tongue midline.  Cranial nerves II through XII intact. UE and LE strength 5/5 and symmetric. UE and LE SILT.  Skin: Skin is warm, dry and intact.  Hematoma to abdominal wall as noted above. Psychiatric: Mood and affect are appropriate for situation.  EKG  Personally reviewed and interpreted by myself.   Rate: 67 Rhythm: sinus Axis: normal Intervals: WNL No acute ischemic changes No evidence of WPW, Brugada, prolonged QTC No acute arrhythmia identified No STEMI    Radiology  Reviewed radiology impressions from CT head and XR sacrum/coccyx obtained on 12/08/2019 ED visit.  MRI  IMPRESSION:  Normal examination. No abnormality seen to explain the clinical presentation.     Procedures  Procedure(s) performed (including critical care):  Procedures   Initial Impression / Assessment and Plan / MDM / ED Course  39 y.o. female who presents to the ED for shakiness, headache, continued tailbone pain after seizure last week.  Further history as above.  Ddx: electrolyte abnormality, urine infection (patient still produces urine), arrhythmia, tension headache, pregnancy.  With her underlying renal disease and history of lupus, as well as complaints that her blood pressures have been elevated over the last week, also consider PRES, though her BP are in moderate range currently.  Will obtain labs, EKG, urine studies, MRI  EKG without acute ischemic changes and no evidence of arrhythmia.  Labs thus far reveal normal potassium.  Negative urine pregnancy.  UDS positive for cannabis. No infection. MRI normal. Patient feeling improved after pain medication. As such, given negative work up, patient stable for discharge w/ outpatient follow up. Advised importance of seeing Neurology, she voices understanding of this and is comfortable w/ discharge at this time. Given return precautions.   _______________________________  As part of my medical decision making I have reviewed available labs, radiology tests, reviewed old records.   Final Clinical Impression(s) / ED Diagnosis  Final diagnoses:  Complaint of headache  Shakiness       Note:  This document was prepared using Dragon voice recognition software and may include unintentional dictation errors.   Lilia Pro., MD 12/14/19 1025

## 2019-12-13 NOTE — ED Notes (Signed)
Pt signed paper copy for d/c. No further questions or concerns at this time

## 2019-12-13 NOTE — Discharge Instructions (Addendum)
Thank you for letting us take care of you in the emergency department today.   Please continue to take any regular, prescribed medications.   Please follow up with: Your nephrology doctor. You should discuss your blood pressure and the possibility of going back on medicine for this. Neurology doctor - call below to schedule an appointment ASAP   Please return to the ER for any new or worsening symptoms.

## 2019-12-13 NOTE — ED Notes (Signed)
Pt to MRI

## 2019-12-13 NOTE — ED Triage Notes (Addendum)
Pt states she had a seizure for the first time last week and today is concerned because she is having uncontrolled "twithing" headaches and breaking out into sweats. Pt is a/ox4 on arrival. Pt states she is on dialysis.

## 2019-12-16 ENCOUNTER — Other Ambulatory Visit: Payer: Self-pay

## 2019-12-16 ENCOUNTER — Encounter: Payer: Self-pay | Admitting: Emergency Medicine

## 2019-12-16 ENCOUNTER — Emergency Department
Admission: EM | Admit: 2019-12-16 | Discharge: 2019-12-16 | Disposition: A | Discharge status: Home / Self Care | Payer: Medicaid Other | Attending: Emergency Medicine | Admitting: Emergency Medicine

## 2019-12-16 DIAGNOSIS — R519 Headache, unspecified: Secondary | ICD-10-CM | POA: Diagnosis not present

## 2019-12-16 DIAGNOSIS — R42 Dizziness and giddiness: Secondary | ICD-10-CM | POA: Diagnosis not present

## 2019-12-16 DIAGNOSIS — Z5321 Procedure and treatment not carried out due to patient leaving prior to being seen by health care provider: Secondary | ICD-10-CM | POA: Insufficient documentation

## 2019-12-16 LAB — CBC
HCT: 44.1 % (ref 36.0–46.0)
Hemoglobin: 13.7 g/dL (ref 12.0–15.0)
MCH: 31.1 pg (ref 26.0–34.0)
MCHC: 31.1 g/dL (ref 30.0–36.0)
MCV: 100 fL (ref 80.0–100.0)
Platelets: 36 10*3/uL — ABNORMAL LOW (ref 150–400)
RBC: 4.41 MIL/uL (ref 3.87–5.11)
RDW: 15 % (ref 11.5–15.5)
WBC: 3.2 10*3/uL — ABNORMAL LOW (ref 4.0–10.5)
nRBC: 0 % (ref 0.0–0.2)

## 2019-12-16 LAB — BASIC METABOLIC PANEL
Anion gap: 17 — ABNORMAL HIGH (ref 5–15)
BUN: 36 mg/dL — ABNORMAL HIGH (ref 6–20)
CO2: 27 mmol/L (ref 22–32)
Calcium: 8.8 mg/dL — ABNORMAL LOW (ref 8.9–10.3)
Chloride: 93 mmol/L — ABNORMAL LOW (ref 98–111)
Creatinine, Ser: 10.75 mg/dL — ABNORMAL HIGH (ref 0.44–1.00)
GFR calc Af Amer: 5 mL/min — ABNORMAL LOW (ref >60)
GFR calc non Af Amer: 4 mL/min — ABNORMAL LOW (ref >60)
Glucose, Bld: 91 mg/dL (ref 70–99)
Potassium: 3.6 mmol/L (ref 3.5–5.1)
Sodium: 137 mmol/L (ref 135–145)

## 2019-12-16 LAB — GLUCOSE, CAPILLARY: Glucose-Capillary: 93 mg/dL (ref 70–99)

## 2019-12-16 LAB — TROPONIN I (HIGH SENSITIVITY): Troponin I (High Sensitivity): 15 ng/L (ref <18)

## 2019-12-16 MED ORDER — ONDANSETRON HCL 4 MG/2ML IJ SOLN
4.0000 mg | Freq: Once | INTRAMUSCULAR | Status: AC | PRN
  Administered 2019-12-16: 4 mg via INTRAVENOUS
  Filled 2019-12-16: qty 2

## 2019-12-16 NOTE — ED Notes (Signed)
Diaphoresis seems to be easing. Nausea improving

## 2019-12-16 NOTE — ED Triage Notes (Signed)
For last week pt has had intermittent dizziness.  Last 3 days pt c/o headache that acutely got worse at 4 AM today waking pt from sleep.  Dialysis cut short today because of sx. Pt diaphoretic in triage.  Vomiting.  No blood thinners.  Pain over right and left temple. No vision changes.  Describes the dizzy feeling as lightheaded like going to pass out.  Pt normally does not get headaches.

## 2019-12-16 NOTE — ED Triage Notes (Signed)
Pt comes into the ED via EMS from dialysis with c/o SOB, only received about 1.5hrs of dialysis, CBG 78.Marland Kitchen

## 2019-12-16 NOTE — ED Notes (Signed)
Pt is tearful, c/o HA. Asked if pt would like to lay back in the recliner instead of the wheelchair, the pt refused at this time. Notified pt has one other pt waiting in front of her for a room. Will continue to monitor the pt.

## 2019-12-17 ENCOUNTER — Telehealth: Payer: Self-pay | Admitting: Emergency Medicine

## 2019-12-17 NOTE — Telephone Encounter (Signed)
Called patient due to lwot to inquire about condition and follow up plans.  No answer and no voicemail  

## 2019-12-18 ENCOUNTER — Telehealth: Payer: Self-pay | Admitting: *Deleted

## 2019-12-18 NOTE — Telephone Encounter (Signed)
Call returned to patient and advised of doctor response and her response was oh, [facility] thank you

## 2019-12-18 NOTE — Telephone Encounter (Signed)
Patient called asking for an appointment so we can refill her Lupus medications. She was a No Show for her appointment in September and when called she wanted to wait and call back to reschedule appointment. Please advise

## 2019-12-18 NOTE — Telephone Encounter (Signed)
No to refill of meds.  She should see nephology first and have them re-refer her back if needed.

## 2019-12-19 ENCOUNTER — Emergency Department: Payer: Medicaid Other

## 2019-12-19 ENCOUNTER — Other Ambulatory Visit: Payer: Self-pay

## 2019-12-19 ENCOUNTER — Encounter: Payer: Self-pay | Admitting: Emergency Medicine

## 2019-12-19 ENCOUNTER — Inpatient Hospital Stay
Admission: EM | Admit: 2019-12-19 | Discharge: 2019-12-22 | DRG: 100 | Disposition: A | Discharge status: Home / Self Care | Payer: Medicaid Other | Attending: Internal Medicine | Admitting: Internal Medicine

## 2019-12-19 DIAGNOSIS — Z79899 Other long term (current) drug therapy: Secondary | ICD-10-CM | POA: Diagnosis not present

## 2019-12-19 DIAGNOSIS — N2581 Secondary hyperparathyroidism of renal origin: Secondary | ICD-10-CM | POA: Diagnosis present

## 2019-12-19 DIAGNOSIS — M3214 Glomerular disease in systemic lupus erythematosus: Secondary | ICD-10-CM | POA: Diagnosis present

## 2019-12-19 DIAGNOSIS — S3210XD Unspecified fracture of sacrum, subsequent encounter for fracture with routine healing: Secondary | ICD-10-CM

## 2019-12-19 DIAGNOSIS — Z833 Family history of diabetes mellitus: Secondary | ICD-10-CM | POA: Diagnosis not present

## 2019-12-19 DIAGNOSIS — R569 Unspecified convulsions: Secondary | ICD-10-CM | POA: Diagnosis present

## 2019-12-19 DIAGNOSIS — N186 End stage renal disease: Secondary | ICD-10-CM | POA: Diagnosis present

## 2019-12-19 DIAGNOSIS — Z87442 Personal history of urinary calculi: Secondary | ICD-10-CM

## 2019-12-19 DIAGNOSIS — I10 Essential (primary) hypertension: Secondary | ICD-10-CM | POA: Diagnosis present

## 2019-12-19 DIAGNOSIS — D631 Anemia in chronic kidney disease: Secondary | ICD-10-CM | POA: Diagnosis present

## 2019-12-19 DIAGNOSIS — Z20822 Contact with and (suspected) exposure to covid-19: Secondary | ICD-10-CM | POA: Diagnosis present

## 2019-12-19 DIAGNOSIS — E875 Hyperkalemia: Secondary | ICD-10-CM | POA: Diagnosis not present

## 2019-12-19 DIAGNOSIS — W19XXXD Unspecified fall, subsequent encounter: Secondary | ICD-10-CM | POA: Diagnosis present

## 2019-12-19 DIAGNOSIS — F17210 Nicotine dependence, cigarettes, uncomplicated: Secondary | ICD-10-CM | POA: Diagnosis present

## 2019-12-19 DIAGNOSIS — R000 Tachycardia, unspecified: Secondary | ICD-10-CM | POA: Diagnosis present

## 2019-12-19 DIAGNOSIS — M329 Systemic lupus erythematosus, unspecified: Secondary | ICD-10-CM | POA: Diagnosis not present

## 2019-12-19 DIAGNOSIS — I120 Hypertensive chronic kidney disease with stage 5 chronic kidney disease or end stage renal disease: Secondary | ICD-10-CM | POA: Diagnosis present

## 2019-12-19 DIAGNOSIS — G40909 Epilepsy, unspecified, not intractable, without status epilepticus: Secondary | ICD-10-CM | POA: Diagnosis present

## 2019-12-19 DIAGNOSIS — S3210XA Unspecified fracture of sacrum, initial encounter for closed fracture: Secondary | ICD-10-CM

## 2019-12-19 DIAGNOSIS — Z8349 Family history of other endocrine, nutritional and metabolic diseases: Secondary | ICD-10-CM

## 2019-12-19 DIAGNOSIS — Z992 Dependence on renal dialysis: Secondary | ICD-10-CM

## 2019-12-19 DIAGNOSIS — D696 Thrombocytopenia, unspecified: Secondary | ICD-10-CM

## 2019-12-19 LAB — CBC WITH DIFFERENTIAL/PLATELET
Abs Immature Granulocytes: 0.02 10*3/uL (ref 0.00–0.07)
Basophils Absolute: 0 10*3/uL (ref 0.0–0.1)
Basophils Relative: 1 %
Eosinophils Absolute: 0.1 10*3/uL (ref 0.0–0.5)
Eosinophils Relative: 1 %
HCT: 40.8 % (ref 36.0–46.0)
Hemoglobin: 12.5 g/dL (ref 12.0–15.0)
Immature Granulocytes: 1 %
Lymphocytes Relative: 16 %
Lymphs Abs: 0.6 10*3/uL — ABNORMAL LOW (ref 0.7–4.0)
MCH: 31 pg (ref 26.0–34.0)
MCHC: 30.6 g/dL (ref 30.0–36.0)
MCV: 101.2 fL — ABNORMAL HIGH (ref 80.0–100.0)
Monocytes Absolute: 0.3 10*3/uL (ref 0.1–1.0)
Monocytes Relative: 7 %
Neutro Abs: 2.9 10*3/uL (ref 1.7–7.7)
Neutrophils Relative %: 74 %
Platelets: 44 10*3/uL — ABNORMAL LOW (ref 150–400)
RBC: 4.03 MIL/uL (ref 3.87–5.11)
RDW: 15 % (ref 11.5–15.5)
WBC: 3.8 10*3/uL — ABNORMAL LOW (ref 4.0–10.5)
nRBC: 0 % (ref 0.0–0.2)

## 2019-12-19 LAB — COMPREHENSIVE METABOLIC PANEL
ALT: 31 U/L (ref 0–44)
AST: 59 U/L — ABNORMAL HIGH (ref 15–41)
Albumin: 3.6 g/dL (ref 3.5–5.0)
Alkaline Phosphatase: 76 U/L (ref 38–126)
Anion gap: 20 — ABNORMAL HIGH (ref 5–15)
BUN: 66 mg/dL — ABNORMAL HIGH (ref 6–20)
CO2: 19 mmol/L — ABNORMAL LOW (ref 22–32)
Calcium: 8 mg/dL — ABNORMAL LOW (ref 8.9–10.3)
Chloride: 99 mmol/L (ref 98–111)
Creatinine, Ser: 18.85 mg/dL — ABNORMAL HIGH (ref 0.44–1.00)
GFR calc Af Amer: 2 mL/min — ABNORMAL LOW (ref >60)
GFR calc non Af Amer: 2 mL/min — ABNORMAL LOW (ref >60)
Glucose, Bld: 99 mg/dL (ref 70–99)
Potassium: 5 mmol/L (ref 3.5–5.1)
Sodium: 138 mmol/L (ref 135–145)
Total Bilirubin: 0.7 mg/dL (ref 0.3–1.2)
Total Protein: 7.5 g/dL (ref 6.5–8.1)

## 2019-12-19 LAB — TROPONIN I (HIGH SENSITIVITY)
Troponin I (High Sensitivity): 21 ng/L — ABNORMAL HIGH (ref <18)
Troponin I (High Sensitivity): 99 ng/L — ABNORMAL HIGH (ref <18)

## 2019-12-19 MED ORDER — LORAZEPAM 2 MG/ML IJ SOLN
1.0000 mg | Freq: Once | INTRAMUSCULAR | Status: AC
  Administered 2019-12-19: 2 mg via INTRAVENOUS
  Filled 2019-12-19: qty 1

## 2019-12-19 MED ORDER — LORAZEPAM 2 MG/ML IJ SOLN
INTRAMUSCULAR | Status: AC
  Filled 2019-12-19: qty 1

## 2019-12-19 MED ORDER — ACETAMINOPHEN 650 MG RE SUPP: 650.0000 mg | RECTAL | Status: DC | PRN

## 2019-12-19 MED ORDER — HALOPERIDOL LACTATE 5 MG/ML IJ SOLN
INTRAMUSCULAR | Status: AC
  Filled 2019-12-19: qty 1

## 2019-12-19 MED ORDER — LEVETIRACETAM IN NACL 500 MG/100ML IV SOLN
500.0000 mg | Freq: Two times a day (BID) | INTRAVENOUS | Status: DC
  Administered 2019-12-20: 500 mg via INTRAVENOUS
  Filled 2019-12-19 (×2): qty 100

## 2019-12-19 MED ORDER — LORAZEPAM 2 MG/ML IJ SOLN: 1.0000 mg | INTRAMUSCULAR | Status: DC | PRN

## 2019-12-19 MED ORDER — LEVETIRACETAM IN NACL 1500 MG/100ML IV SOLN
1500.0000 mg | Freq: Once | INTRAVENOUS | Status: AC
  Administered 2019-12-19: 1500 mg via INTRAVENOUS
  Filled 2019-12-19: qty 100

## 2019-12-19 MED ORDER — MIDAZOLAM HCL 2 MG/2ML IJ SOLN
INTRAMUSCULAR | Status: AC
  Filled 2019-12-19: qty 2

## 2019-12-19 MED ORDER — ONDANSETRON HCL 4 MG PO TABS: 4.0000 mg | ORAL_TABLET | Freq: Four times a day (QID) | ORAL | Status: DC | PRN

## 2019-12-19 MED ORDER — SODIUM CHLORIDE 0.9 % IV SOLN
75.0000 mL/h | INTRAVENOUS | Status: DC
  Administered 2019-12-19 – 2019-12-20 (×2): 75 mL/h via INTRAVENOUS

## 2019-12-19 MED ORDER — ONDANSETRON HCL 4 MG/2ML IJ SOLN
4.0000 mg | Freq: Four times a day (QID) | INTRAMUSCULAR | Status: DC | PRN
  Administered 2019-12-22: 4 mg via INTRAVENOUS
  Filled 2019-12-19: qty 2

## 2019-12-19 MED ORDER — ACETAMINOPHEN 500 MG PO TABS
1000.0000 mg | ORAL_TABLET | Freq: Once | ORAL | Status: AC
  Administered 2019-12-19: 1000 mg via ORAL
  Filled 2019-12-19: qty 2

## 2019-12-19 MED ORDER — ACETAMINOPHEN 325 MG PO TABS
650.0000 mg | ORAL_TABLET | ORAL | Status: DC | PRN
  Administered 2019-12-22: 650 mg via ORAL

## 2019-12-19 NOTE — ED Triage Notes (Signed)
Pt arrival via [facility] from home due to seizure. EMS states that patient told her family she didn't feel well and then collapsed in the bathroom and had a tonic clonic seizure. Pt had a seizure for about 5 mins and was post ictal with EMS.   Pt is a&ox4 on arrival but is slightly slow to respond. Pt is a dialysis patient and has a port in R chest.  Pt able to explain what happened before her seizures. Pt states her butt is sore from last seizure and that she has a headache at this moment.   Dr. Cherylann Banas at bedside.

## 2019-12-19 NOTE — ED Provider Notes (Signed)
[facility] Emergency Department Provider Note ____________________________________________   First MD Initiated Contact with Patient 12/19/19 1736     (approximate)  I have reviewed the triage vital signs and the nursing notes.   HISTORY  Chief Complaint Seizures    HPI Patricia Maldonado is a 39 y.o. female with PMH as noted below including ESRD on dialysis as well as a recent new diagnosis of seizure a few weeks ago who presents after an apparent seizure.  Per EMS, the patient told her family she did not feel well and then collapsed in the bathroom.  She was noted to have a generalized tonic-clonic seizure.  It lasted for several minutes, and then the patient was confused.  Currently, the patient is alert and oriented.  She states that she remembers not feeling right, and then the next thing she knows the ambulance providers were around her.  The patient reports a headache but denies other acute symptoms.  She states that she had not yet seen neurology after the prior seizure.  She denies any recent medication changes, and last had dialysis 3 days ago.  Past Medical History:  Diagnosis Date  . Acute renal failure (ARF) ([place])   . Anemia   . Arthritis   . History of kidney stones   . Hypertension    OFF BP MEDS X 3 WEEKS DUE TO BP CONTROL  . 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 Rash   . Staghorn calculus   . 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[place]) 10/2016   [place] ADMISSION    Patient Active Problem List   Diagnosis Date Noted  . Acute repetitive seizure ([place]) 12/19/2019  . History of Closed sacral fracture ([place]) 12/19/2019  . Thrombocytopenia ([place]) 12/19/2019  . Anemia, unspecified 04/28/2019  . ESRD on hemodialysis ([place]) 02/21/2019  . Essential hypertension 02/21/2019  . CKD (chronic kidney disease), stage V ([place]) 09/12/2018  . Metabolic acidosis 00/93/8182  . Secondary hyperparathyroidism ([place]) 09/12/2018  . Acute on chronic  renal failure ([place]) 04/26/2018  . Acute kidney injury ([place]) 07/27/2017  . Alopecia of scalp 03/08/2017  . Thyroid nodule 03/08/2017  . PE (pulmonary thromboembolism) ([place]) 03/01/2017  . Kidney stone on left side 01/23/2017  . Systemic lupus erythematosus ([place]) 12/08/2016  . Encounter for long-term (current) use of high-risk medication 12/08/2016  . 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 Acute renal failure (ARF) ([place]) 10/04/2016  . Nephrolithiasis   . Staghorn calculus   . Acute renal failure with tubular necrosis ([place]) 08/30/2016    Past Surgical History:  Procedure Laterality Date  . CYSTOSCOPY/URETEROSCOPY/HOLMIUM LASER/STENT PLACEMENT Right 02/21/2017   Procedure: CYSTOSCOPY/URETEROSCOPY/HOLMIUM LASER/STENT EXCHANGE;  Surgeon: [name], MD;  Location: [facility] ORS;  Service: Urology;  Laterality: Right;  . DIALYSIS/PERMA CATHETER INSERTION N/A 11/30/2018   Procedure: DIALYSIS/PERMA CATHETER INSERTION;  Surgeon: Algernon Huxley, MD;  Location: [place] [facility];  Service: Cardiovascular;  Laterality: N/A;  . IR NEPHROSTOMY PLACEMENT RIGHT  01/23/2017  . NEPHROLITHOTOMY Right 01/24/2017   Procedure: NEPHROLITHOTOMY PERCUTANEOUS;  Surgeon: [name], MD;  Location: [facility] ORS;  Service: Urology;  Laterality: Right;  . PERITONEAL CATHETER INSERTION    . TUBAL LIGATION      Prior to Admission medications   Medication Sig Start Date End Date Taking? Authorizing Provider  acetaminophen (TYLENOL) 500 MG tablet Take 500 mg by mouth every 6 (six) hours as needed for headache.    [provider]  B Complex-C-Folic Acid (RENA-VITE RX) 1 MG TABS 1 tablet. T, Th, Sat (dialysis days only 01/03/19   [provider]  mycophenolate (CELLCEPT) 500 MG tablet Take 2 tablets (1,000 mg total) by mouth 2 (two) times daily. 04/30/18   Hillary Bow, MD  oxyCODONE-acetaminophen (PERCOCET) 5-325 MG tablet Take 1 tablet by mouth every 6 (six) hours as needed for  severe pain. 12/08/19 12/07/20  Carrie Mew, MD  RENAGEL 800 MG tablet Take 1 tablet by mouth 3 (three) times daily. 02/21/19   [provider]  triamcinolone cream (KENALOG) 0.5 % Apply 1 application topically 2 (two) times daily. External ear - 5 days Patient not taking: Reported on 09/20/2019 05/01/18   Hillary Bow, MD    Allergies Penicillins  Family History  Problem Relation Age of Onset  . Thyroid disease Mother   . Thyroid disease Father   . Diabetes Father     Social History Social History   Tobacco Use  . Smoking status: Current Every Day Smoker    Packs/day: 0.25    Years: 15.00    Pack years: 3.75    Types: Cigarettes  . Smokeless tobacco: Never Used  Substance Use Topics  . Alcohol use: No  . Drug use: Yes    Types: Marijuana    Comment: OCC    Review of Systems  Constitutional: No fever. Eyes: No redness. ENT: No sore throat. Cardiovascular: Denies chest pain. Respiratory: Denies shortness of breath. Gastrointestinal: No vomiting. Genitourinary: Negative for flank pain.  Musculoskeletal: Negative for back pain. Skin: Negative for rash. Neurological: Positive for headache.   ____________________________________________   PHYSICAL EXAM:  VITAL SIGNS: ED Triage Vitals  Enc Vitals Group     BP 12/19/19 1740 (!) 164/118     Pulse Rate 12/19/19 1740 66     Resp 12/19/19 1740 (!) 21     Temp 12/19/19 1742 98.1 F (36.7 C)     Temp Source 12/19/19 1742 Oral     SpO2 12/19/19 1728 100 %     Weight 12/19/19 1732 97 lb (44 kg)     Height 12/19/19 1732 5\' 2"  (1.575 m)     Head Circumference --      Peak Flow --      Pain Score 12/19/19 1734 9     Pain Loc --      Pain Edu? --      Excl. in [place]? --     Constitutional: Alert and oriented. Well appearing and in no acute distress. Eyes: Conjunctivae are normal.  Relatively EOMI.  PERRLA. Head: Atraumatic. Nose: No congestion/rhinnorhea. Mouth/Throat: Mucous membranes are moist.     Neck: Normal range of motion.  Cardiovascular: Normal rate, regular rhythm.  Good peripheral circulation. Respiratory: Normal respiratory effort.  No retractions. Gastrointestinal: Soft and nontender. No distention.  Genitourinary: No flank tenderness.  Musculoskeletal: No lower extremity edema.  Extremities warm and well perfused.  Neurologic:  Normal speech and language.  5/5 motor strength and intact sensation to all extremities.  No ataxia.   Skin:  Skin is warm and dry. No rash noted. Psychiatric: Mood and affect are normal. Speech and behavior are normal.  ____________________________________________   LABS (all labs ordered are listed, but only abnormal results are displayed)  Labs Reviewed  CBC WITH DIFFERENTIAL/PLATELET - Abnormal; Notable for the following components:      Result Value   WBC 3.8 (*)    MCV 101.2 (*)    Platelets 44 (*)  Lymphs Abs 0.6 (*)    All other components within normal limits  COMPREHENSIVE METABOLIC PANEL - Abnormal; Notable for the following components:   CO2 19 (*)    BUN 66 (*)    Creatinine, Ser 18.85 (*)    Calcium 8.0 (*)    AST 59 (*)    GFR calc non Af Amer 2 (*)    GFR calc Af Amer 2 (*)    Anion gap 20 (*)    All other components within normal limits  TROPONIN I (HIGH SENSITIVITY) - Abnormal; Notable for the following components:   Troponin I (High Sensitivity) 21 (*)    All other components within normal limits  TROPONIN I (HIGH SENSITIVITY)   ____________________________________________  EKG  ED ECG REPORT I, Arta Silence, the attending physician, personally viewed and interpreted this ECG.  Date: 12/19/2019 EKG Time: 1741 Rate: 68 Rhythm: normal sinus rhythm QRS Axis: normal Intervals: normal ST/T Wave abnormalities: normal Narrative Interpretation: no evidence of acute ischemia  ____________________________________________  RADIOLOGY  CT head: No ICH or other acute  abnormality  ____________________________________________   PROCEDURES  Procedure(s) performed: Yes  .1-3 Lead EKG Interpretation Performed by: Arta Silence, MD Authorized by: Arta Silence, MD     Interpretation: normal     ECG rate:  85   ECG rate assessment: normal     Rhythm: sinus rhythm     Ectopy: none     Conduction: normal   Comments:     Patient required cardiac monitoring due to seizure in the ED followed by prolonged postictal state with agitated delirium, to confirm no arrhythmia or ectopy.    Critical Care performed: Yes  CRITICAL CARE Performed by: Arta Silence   Total critical care time: 75 minutes  Critical care time was exclusive of separately billable procedures and treating other patients.  Critical care was necessary to treat or prevent imminent or life-threatening deterioration.  Critical care was time spent personally by me on the following activities: development of treatment plan with patient and/or surrogate as well as nursing, discussions with consultants, evaluation of patient's response to treatment, examination of patient, obtaining history from patient or surrogate, ordering and performing treatments and interventions, ordering and review of laboratory studies, ordering and review of radiographic studies, pulse oximetry and re-evaluation of patient's condition. ____________________________________________   INITIAL IMPRESSION / ASSESSMENT AND PLAN / ED COURSE  Pertinent labs & imaging results that were available during my care of the patient were reviewed by me and considered in my medical decision making (see chart for details).  39 year old female with PMH as noted above including ESRD on dialysis presents after an apparent witnessed generalized tonic-clonic seizure.  The patient was apparently postictal with EMS, but now is alert and oriented x3.  I reviewed the past medical records in epic.  The patient was initially  seen in the ED on 3/7 for a first-time seizure.  She had a CT head and lab work-up at that time which were unremarkable except for a sacral fracture.  The patient returned to the ED on 3/12 for evaluation of shakiness, headache, and continued sacral area pain.  She has not had any additional seizures.  She had an MRI of the brain on 3/12 which was negative.  Overall presentation is consistent with seizure disorder.  We will obtain a lab work-up, observe the patient for the next few hours, and I will consult neurology to determine whether the patient should be admitted for EEG and further work-up,  or could be started on an antiepileptic and followed up outpatient.  At this time, based on the CT and MRI within the last 10 days which were negative I do not see indication for repeat imaging.  ----------------------------------------- 8:02 PM on 12/19/2019 -----------------------------------------  The patient had a witnessed generalized tonic-clonic seizure in the ED.  It lasted about 1 to 2 minutes, the patient was somnolent and postictal afterwards.  We gave 2 mg of Ativan.  Several minutes later, the patient started thrashing around in the bed and intermittently screaming.  When I evaluated her, she appeared to be having agitated delirium, most likely related to her postictal state.  We gave a dose of 4 mg of Versed, an additional dose of 2 mg of Ativan, and then 5 mg of Haldol, all without any significant response.  The patient remained in an agitated delirium, thrashing in the bed, trying to bite and kick at staff although was not speaking or interacting in any meaningful way.  We were unable to verbally redirect her.  Due to the postictal state, I gave additional sequential doses of Ativan every 10 to 15 minutes.  After 12 mg of Ativan, the patient's delirium finally resolved and she is now resting comfortably and maintaining good respiratory effort and an O2 saturation in the 90s on room air.  Because  of the recurrent seizure and delirium, I will obtain a CT of the head.  I have ordered IV Keppra.  We will continue to closely monitor the patient.  I will proceed with admission once imaging is obtained.  ----------------------------------------- 10:21 PM on 12/19/2019 -----------------------------------------  The patient has been sleeping comfortably since the Ativan dose is given above.  She has had no further seizure activity.  Given the recurrent seizures, she will require admission for further observation and work-up.  I discussed her case with Dr. Damita Dunnings from the hospital service.  ____________________________________________   FINAL CLINICAL IMPRESSION(S) / ED DIAGNOSES  Final diagnoses:  Seizure disorder ([place])      NEW MEDICATIONS STARTED DURING THIS VISIT:  New Prescriptions   No medications on file     Note:  This document was prepared using Dragon voice recognition software and may include unintentional dictation errors.    Arta Silence, MD 12/19/19 2222

## 2019-12-19 NOTE — ED Notes (Addendum)
Multiple attempts to get IV's with no success due to patient thrashing around.  4 of versed given IM due to lack of access.

## 2019-12-19 NOTE — H&P (Signed)
History and Physical    Patricia Maldonado ZOX:[id number] DOB: [birth year] DOA: 12/19/2019  PCP: Letta Median, MD   Patient coming from: home  I have personally briefly reviewed patient's old medical records in [place]  Chief Complaint: seizure  HPI: Patricia Maldonado is a 39 y.o. female with medical history significant for ESRD secondary to lupus nephritis recently started on hemodialysis, MWF, and history of HTN, recently seen in the ER on 12/08/2019 for a first seizure with negative work-up and discharged with neurology appointment, who returns to the ER after she had a witnessed seizure at home lasting 5 minutes. ED course:   Upon arrival in the emergency room she was awake and alert but, went on to have another seizure  that lasted 1 minute.  Patient was administered Ativan in the emergency room with a witnessed seizure and subsequently became very agitated, requiring up to 12 mg of Ativan for sedation.  On arrival, vitals in the emergency room were within normal limits except for BP of 164/118 on arrival.  Her blood work was significant for platelets of 44,000, which is about her baseline, renal function is expected for dialysis but with bicarb of 19, potassium of 5.  She had 2 troponins 15>>21.  CT head negative.  Patient was loaded with Keppra 1500 mg and hospitalist consulted for admission.   Review of Systems: Unable to obtain due to sedation related to Ativan administered earlier  Past Medical History:  Diagnosis Date  . Acute renal failure (ARF) ([place])   . Anemia   . Arthritis   . History of kidney stones   . Hypertension    OFF BP MEDS X 3 WEEKS DUE TO BP CONTROL  . 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 Rash   . Staghorn calculus   . 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[place]) 10/2016   DX DURING HOSPITAL ADMISSION    Past Surgical History:  Procedure Laterality Date  . CYSTOSCOPY/URETEROSCOPY/HOLMIUM LASER/STENT PLACEMENT Right 02/21/2017   Procedure:  CYSTOSCOPY/URETEROSCOPY/HOLMIUM LASER/STENT EXCHANGE;  Surgeon: [name], MD;  Location: [facility] ORS;  Service: Urology;  Laterality: Right;  . DIALYSIS/PERMA CATHETER INSERTION N/A 11/30/2018   Procedure: DIALYSIS/PERMA CATHETER INSERTION;  Surgeon: Algernon Huxley, MD;  Location: [place] CV LAB;  Service: Cardiovascular;  Laterality: N/A;  . IR NEPHROSTOMY PLACEMENT RIGHT  01/23/2017  . NEPHROLITHOTOMY Right 01/24/2017   Procedure: NEPHROLITHOTOMY PERCUTANEOUS;  Surgeon: [name], MD;  Location: [facility] ORS;  Service: Urology;  Laterality: Right;  . PERITONEAL CATHETER INSERTION    . TUBAL LIGATION       reports that she has been smoking cigarettes. She has a 3.75 pack-year smoking history. She has never used smokeless tobacco. She reports current drug use. Drug: Marijuana. She reports that she does not drink alcohol.  Allergies  Allergen Reactions  . Penicillins Swelling    Yeast infection Has patient had a PCN reaction causing immediate rash, facial/tongue/throat swelling, SOB or lightheadedness with hypotension:No Has patient had a PCN reaction causing severe rash involving mucus membranes or skin necrosis: Yes Has patient had a PCN reaction that required hospitalization No Has patient had a PCN reaction occurring within the last 10 years: No If all of the above answers are "NO", then may proceed with Cephalosporin use.  Yeast infection Has patient had a PCN reaction causing immediate rash, facial/tongue/throat swelling, SOB or lightheadedness with hypotension:No Has patient had a PCN reaction causing severe rash involving mucus membranes or skin necrosis:  Yes Has patient had a PCN reaction that required hospitalization No Has patient had a PCN reaction occurring within the last 10 years: No If all of the above answers are "NO", then may proceed with Cephalosporin use.    Family History  Problem Relation Age of Onset  . Thyroid disease Mother   . Thyroid disease Father     . Diabetes Father      Prior to Admission medications   Medication Sig Start Date End Date Taking? Authorizing Provider  acetaminophen (TYLENOL) 500 MG tablet Take 500 mg by mouth every 6 (six) hours as needed for headache.    [provider]  B Complex-C-Folic Acid (RENA-VITE RX) 1 MG TABS 1 tablet. T, Th, Sat (dialysis days only 01/03/19   [provider]  mycophenolate (CELLCEPT) 500 MG tablet Take 2 tablets (1,000 mg total) by mouth 2 (two) times daily. 04/30/18   Hillary Bow, MD  oxyCODONE-acetaminophen (PERCOCET) 5-325 MG tablet Take 1 tablet by mouth every 6 (six) hours as needed for severe pain. 12/08/19 12/07/20  Carrie Mew, MD  RENAGEL 800 MG tablet Take 1 tablet by mouth 3 (three) times daily. 02/21/19   [provider]  triamcinolone cream (KENALOG) 0.5 % Apply 1 application topically 2 (two) times daily. External ear - 5 days Patient not taking: Reported on 09/20/2019 05/01/18   Hillary Bow, MD    Physical Exam: Vitals:   12/19/19 1732 12/19/19 1740 12/19/19 1742 12/19/19 1800  BP:  (!) 164/118  (!) 174/113  Pulse:  66  71  Resp:  (!) 21  10  Temp:   98.1 F (36.7 C)   TempSrc:   Oral   SpO2:  100%  97%  Weight: 44 kg     Height: 5\' 2"  (1.575 m)        Vitals:   12/19/19 1732 12/19/19 1740 12/19/19 1742 12/19/19 1800  BP:  (!) 164/118  (!) 174/113  Pulse:  66  71  Resp:  (!) 21  10  Temp:   98.1 F (36.7 C)   TempSrc:   Oral   SpO2:  100%  97%  Weight: 44 kg     Height: 5\' 2"  (1.575 m)       Constitutional: Very somnolent, arouses only with vigorous shaking but then readily goes back to sleep not in any acute distress. Eyes: PERLA, EOMI, irises appear normal, anicteric sclera,  ENMT: external ears and nose appear normal, normal hearing             Lips appears normal, oropharynx mucosa, tongue, posterior pharynx appear normal  Neck: neck appears normal, no masses, normal ROM, no thyromegaly, no JVD  CVS: S1-S2 clear, no  murmur rubs or gallops,  , no carotid bruits, pedal pulses palpable, No LE edema Respiratory:  clear to auscultation bilaterally, no wheezing, rales or rhonchi. Respiratory effort normal. No accessory muscle use.  Abdomen: soft nontender, nondistended, normal bowel sounds, no hepatosplenomegaly, no hernias Musculoskeletal: : no cyanosis, clubbing , no contractures or atrophy Neuro: Grossly intact.  Due to somnolence unable to thoroughly examine Psych: Unable to assess due to medication related somnolence Skin: no rashes or lesions or ulcers, no induration or nodules   Labs on Admission: I have personally reviewed following labs and imaging studies  CBC: Recent Labs  Lab 12/13/19 0937 12/16/19 1403 12/19/19 1743  WBC 3.1* 3.2* 3.8*  NEUTROABS 1.9  --  2.9  HGB 13.2 13.7 12.5  HCT 43.5 44.1 40.8  MCV 101.9* 100.0 101.2*  PLT 41* 36* 44*   Basic Metabolic Panel: Recent Labs  Lab 12/13/19 0937 12/16/19 1403 12/19/19 1743  NA 138 137 138  K 4.4 3.6 5.0  CL 96* 93* 99  CO2 26 27 19*  GLUCOSE 85 91 99  BUN 33* 36* 66*  CREATININE 11.10* 10.75* 18.85*  CALCIUM 8.8* 8.8* 8.0*  MG 2.0  --   --   PHOS 8.3*  --   --    GFR: Estimated Creatinine Clearance: 2.9 mL/min (A) (by C-G formula based on SCr of 18.85 mg/dL (H)). Liver Function Tests: Recent Labs  Lab 12/13/19 0937 12/19/19 1743  AST 19 59*  ALT 11 31  ALKPHOS 58 76  BILITOT 0.8 0.7  PROT 7.5 7.5  ALBUMIN 3.6 3.6   No results for input(s): LIPASE, AMYLASE in the last 168 hours. No results for input(s): AMMONIA in the last 168 hours. Coagulation Profile: No results for input(s): INR, PROTIME in the last 168 hours. Cardiac Enzymes: No results for input(s): CKTOTAL, CKMB, CKMBINDEX, TROPONINI in the last 168 hours. BNP (last 3 results) No results for input(s): PROBNP in the last 8760 hours. HbA1C: No results for input(s): HGBA1C in the last 72 hours. CBG: Recent Labs  Lab 12/16/19 1407  GLUCAP 93   Lipid  Profile: No results for input(s): CHOL, HDL, LDLCALC, TRIG, CHOLHDL, LDLDIRECT in the last 72 hours. Thyroid Function Tests: No results for input(s): TSH, T4TOTAL, FREET4, T3FREE, THYROIDAB in the last 72 hours. Anemia Panel: No results for input(s): VITAMINB12, FOLATE, FERRITIN, TIBC, IRON, RETICCTPCT in the last 72 hours. Urine analysis:    Component Value Date/Time   COLORURINE YELLOW (A) 12/13/2019 0938   APPEARANCEUR CLOUDY (A) 12/13/2019 0938   APPEARANCEUR Cloudy 09/16/2014 0814   LABSPEC 1.017 12/13/2019 0938   LABSPEC 1.016 09/16/2014 0814   PHURINE 9.0 (H) 12/13/2019 0938   GLUCOSEU NEGATIVE 12/13/2019 0938   GLUCOSEU Negative 09/16/2014 0814   HGBUR SMALL (A) 12/13/2019 0938   BILIRUBINUR NEGATIVE 12/13/2019 0938   BILIRUBINUR Negative 09/16/2014 0814   KETONESUR NEGATIVE 12/13/2019 0938   PROTEINUR >=300 (A) 12/13/2019 0938   NITRITE NEGATIVE 12/13/2019 0938   LEUKOCYTESUR NEGATIVE 12/13/2019 0938   LEUKOCYTESUR 3+ 09/16/2014 0814    Radiological Exams on Admission: CT Head Wo Contrast  Result Date: 12/19/2019 CLINICAL DATA:  Seizure EXAM: CT HEAD WITHOUT CONTRAST TECHNIQUE: Contiguous axial images were obtained from the base of the skull through the vertex without intravenous contrast. COMPARISON:  MRI 12/12/2016 FINDINGS: Brain: No acute territorial infarction, hemorrhage or intracranial mass. The ventricles are nonenlarged. Vascular: No hyperdense vessel or unexpected calcification. Skull: Normal. Negative for fracture or focal lesion. Sinuses/Orbits: No acute finding. Other: None IMPRESSION: Negative non contrasted CT appearance of the brain Electronically Signed   By: Donavan Foil M.D.   On: 12/19/2019 21:08    EKG: Independently reviewed.   Assessment/Plan Principal Problem:   Acute repetitive seizure [facility]) -Patient had first seizure on 12/08/2019.  Had 2 seizures on the day of arrival -Continue Keppra 500 mg twice daily -Ativan as needed seizure -Seizure,  fall and aspiration precautions -N.p.o. except for sips tonight -Neurology consult -EEG    ESRD on hemodialysis [facility]) -Nephrology consulted for continuation of dialysis    Thrombocytopenia ([place])   Systemic lupus erythematosus ([place]) -Patient with thrombocytopenia for the past year, possibly related to her lupus -DVT prophylaxis with SCDs -No evidence of active bleeding but continue to monitor.    Essential hypertension -Not  currently on antihypertensives.  Continue to monitor for need  Sacral pain with history of Closed sacral fracture 12/08/2019 [facility]) -Recent history of sacral fracture related to fall onto her buttock when she had first seizure on 12/08/2019 -Pain control    DVT prophylaxis: SCDCode Status: full code  Family Communication:  none  Disposition Plan: Back to previous home environment Consults called: Nephrology, Dr. Jake Bathe, neurology, Dr. Doy Mince Status:inp    Patricia Masse MD [facility]     12/19/2019, 10:37 PM

## 2019-12-19 NOTE — ED Notes (Signed)
Pt changed with tech at bedside assisting. Pt pants/underwear clean and dry at this time. Pants removed and placed in patient belongings bag.  Pt shirt ruined with blood, cut off patient and thrown away. Pt put in gown and appears comfortable at this time. Pt still not alert and oriented but is responsive to voice/touch. Will continue to monitor.

## 2019-12-19 NOTE — ED Notes (Signed)
Pt states she's not feeling well while this nurse is putting on the seizure pads. Pt stiffens up and begins having tonic clonic seizure. Dr. Cherylann Banas called to bedside, 2 mg of ativan given per verbal order by Dr. Cherylann Banas.   Pt comes out of seizure into post ictal state. Dr. Cherylann Banas at bedside.

## 2019-12-19 NOTE — ED Notes (Addendum)
Pt unable to be calmed and will not lie down to be still. Pt combative, not a&o, and keeps moaning. This nurse alerted the charge nurse to assist.  Charge nurse at bedside, charge nurse alerted Dr. Cherylann Banas and he came to bedside. Pt post ictal and unable to calm down from 6:15 pm to 8:00 pm. Staff at bedside included:  Jenetta Downer, RN -Amy, RN charge -Olen Cordial, RN -Mitch, RN -Mac, RN Dr. Cherylann Banas -Chrissy, RN (this nurse)  Staff above switched in and out in order to retrain the patient to keep the patient from pulling out only IV access. Pt did have 20 g IV in left arm, but IV got kinked and had to be pulled.   Pt given 6 doses of total 12 mg's of IV ativan, 4 mg of versed IM, 1 dose or 3m of haldol IV per verbal order with Dr. SCherylann Banas Pt still combative until 6th dose of ativan was given and patient appeared to be calm/asleep at this time. Pt airway intact at all times and patient's sats maintained well on her own. Pt family at bedside throughout this time, patient family kept updated at bedside and was answered any questions she had.

## 2019-12-19 NOTE — ED Notes (Signed)
Pt returned from CT, pt remain post ictal and rolling around in bed, pt not easily redirected but is calm at this time

## 2019-12-19 NOTE — ED Notes (Addendum)
Pt attempted to climb out of bed and is not oriented at this time. Pt pulled out IV. Charge nurse and MD notified by this RN. Pt restrained by this nurse and charge nurse for safety.   Pt mother sitting at bedside.

## 2019-12-19 NOTE — ED Notes (Signed)
Pt transported to CT with this tech, CT tech Arnel, and RN [employer]

## 2019-12-19 NOTE — ED Notes (Signed)
This tech at pt bedside for safety, pt is sleeping comfortably at this time but will periodically roll over and put blanket over her face. Pt lights turned out for pt comfort. Mother also at bedside

## 2019-12-20 DIAGNOSIS — Z992 Dependence on renal dialysis: Secondary | ICD-10-CM

## 2019-12-20 DIAGNOSIS — I10 Essential (primary) hypertension: Secondary | ICD-10-CM

## 2019-12-20 DIAGNOSIS — N186 End stage renal disease: Secondary | ICD-10-CM

## 2019-12-20 DIAGNOSIS — G40909 Epilepsy, unspecified, not intractable, without status epilepticus: Secondary | ICD-10-CM

## 2019-12-20 DIAGNOSIS — M329 Systemic lupus erythematosus, unspecified: Secondary | ICD-10-CM

## 2019-12-20 LAB — HIV ANTIBODY (ROUTINE TESTING W REFLEX): HIV Screen 4th Generation wRfx: NONREACTIVE

## 2019-12-20 LAB — SEDIMENTATION RATE: Sed Rate: 37 mm/hr — ABNORMAL HIGH (ref 0–20)

## 2019-12-20 LAB — PHOSPHORUS: Phosphorus: 8.4 mg/dL — ABNORMAL HIGH (ref 2.5–4.6)

## 2019-12-20 LAB — C-REACTIVE PROTEIN: CRP: <0.5 mg/dL (ref <1.0)

## 2019-12-20 LAB — MAGNESIUM: Magnesium: 2 mg/dL (ref 1.7–2.4)

## 2019-12-20 MED ORDER — LORAZEPAM 2 MG/ML IJ SOLN
1.0000 mg | INTRAMUSCULAR | Status: DC | PRN
  Administered 2019-12-20: 1 mg via INTRAVENOUS
  Administered 2019-12-22: 2 mg via INTRAVENOUS
  Filled 2019-12-20 (×2): qty 1

## 2019-12-20 MED ORDER — LEVETIRACETAM IN NACL 500 MG/100ML IV SOLN
500.0000 mg | Freq: Every day | INTRAVENOUS | Status: DC
  Administered 2019-12-20: 21:00:00 500 mg via INTRAVENOUS
  Filled 2019-12-20 (×3): qty 100

## 2019-12-20 MED ORDER — HYDRALAZINE HCL 20 MG/ML IJ SOLN
5.0000 mg | Freq: Four times a day (QID) | INTRAMUSCULAR | Status: DC | PRN
  Administered 2019-12-20 (×2): 5 mg via INTRAVENOUS
  Filled 2019-12-20 (×2): qty 1

## 2019-12-20 NOTE — ED Notes (Signed)
EEG at bedside.

## 2019-12-20 NOTE — Procedures (Signed)
ELECTROENCEPHALOGRAM REPORT   Patient: Patricia Maldonado       Room #: FY92K EEG No. ID: [id number] Age: 39 y.o.        Sex: female Requesting Physician: Manuella Ghazi Report Date:  12/20/2019        Interpreting Physician: Alexis Goodell  History: Patricia Maldonado is an 39 y.o. female with recurrent seizure activity  Medications:  Keppra, Ativan  Conditions of Recording:  This is a 21 channel routine scalp EEG performed with bipolar and monopolar montages arranged in accordance to the international 10/20 system of electrode placement. One channel was dedicated to EKG recording.  The patient is in the awake, drowsy and asleep states.  Description:  The patient is awake only briefly when the waking background activity is noted to consist of a low voltage, symmetrical, fairly well organized, 8 Hz alpha activity, seen from the parieto-occipital and posterior temporal regions.  Low voltage fast activity, poorly organized, is seen anteriorly and is at times superimposed on more posterior regions.  A mixture of theta and alpha rhythms are seen from the central and temporal regions. The patient drowses with slowing to irregular, low voltage theta and beta activity.  The patient appears to be in drowse for the majority of the recording.   The patient goes in to a light sleep with symmetrical sleep spindles, vertex central sharp transients and irregular slow activity. No epileptiform activity is noted.   Hyperventilation and intermittent photic stimulation were not performed.   IMPRESSION: Normal electroencephalogram, awake, asleep and with activation procedures. There are no focal lateralizing or epileptiform features.   Alexis Goodell, MD Neurology [phone] 12/20/2019, 3:21 PM

## 2019-12-20 NOTE — ED Notes (Signed)
Pt being transported to Dialysis at this time by EDT Dorian

## 2019-12-20 NOTE — ED Notes (Signed)
Attempted to call report to floor, was told would have to call back. Informed floor we were surge red and would have to be sending pt up shortly.

## 2019-12-20 NOTE — Progress Notes (Signed)
[facility]  ROUNDING NOTE   Subjective:   [name] admitted to [facility] on 12/19/2019 for Seizure [facility]) [R56.9] Seizure disorder ([place]) [Y07.371]  Patient was brought down for dialysis. However patient with altered mental status. She was unable to cooperate and then started to pull at her tunneled catheter.   Objective:  Vital signs in last 24 hours:  Temp:  [97.8 F (36.6 C)-98.1 F (36.7 C)] 97.8 F (36.6 C) (03/19 0535) Pulse Rate:  [58-84] 72 (03/19 0840) Resp:  [0-24] 22 (03/19 0840) BP: (140-178)/(98-118) 148/101 (03/19 0730) SpO2:  [94 %-100 %] 97 % (03/19 0840) Weight:  [44 kg] 44 kg (03/19 0745)  Weight change:  Filed Weights   12/19/19 1732 12/20/19 0745  Weight: 44 kg 44 kg    Intake/Output: No intake/output data recorded.   Intake/Output this shift:  No intake/output data recorded.  Physical Exam: General: somnulence  Head: Normocephalic, atraumatic. Moist oral mucosal membranes  Eyes: Anicteric, PERRL  Neck: Supple, trachea midline  Lungs:  Clear to auscultation  Heart: Regular rate and rhythm  Abdomen:  Soft, nontender,   Extremities: no peripheral edema.  Neurologic: Unable to follow commands, moving all extremities  Skin: No lesions  Access: RIJ permcath    Basic Metabolic Panel: Recent Labs  Lab 12/13/19 0937 12/16/19 1403 12/19/19 1743  NA 138 137 138  K 4.4 3.6 5.0  CL 96* 93* 99  CO2 26 27 19*  GLUCOSE 85 91 99  BUN 33* 36* 66*  CREATININE 11.10* 10.75* 18.85*  CALCIUM 8.8* 8.8* 8.0*  MG 2.0  --   --   PHOS 8.3*  --   --     Liver Function Tests: Recent Labs  Lab 12/13/19 0937 12/19/19 1743  AST 19 59*  ALT 11 31  ALKPHOS 58 76  BILITOT 0.8 0.7  PROT 7.5 7.5  ALBUMIN 3.6 3.6   No results for input(s): LIPASE, AMYLASE in the last 168 hours. No results for input(s): AMMONIA in the last 168 hours.  CBC: Recent Labs  Lab 12/13/19 0937 12/16/19 1403 12/19/19 1743  WBC 3.1* 3.2* 3.8*  NEUTROABS  1.9  --  2.9  HGB 13.2 13.7 12.5  HCT 43.5 44.1 40.8  MCV 101.9* 100.0 101.2*  PLT 41* 36* 44*    Cardiac Enzymes: No results for input(s): CKTOTAL, CKMB, CKMBINDEX, TROPONINI in the last 168 hours.  BNP: Invalid input(s): POCBNP  CBG: Recent Labs  Lab 12/16/19 0626  RSWNIO 27    Microbiology: Results for orders placed or performed during the hospital encounter of 09/20/19  SARS CORONAVIRUS 2 (TAT 6-24 HRS) Nasopharyngeal Nasopharyngeal Swab     Status: None   Collection Time: 09/20/19 10:14 AM   Specimen: Nasopharyngeal Swab  Result Value Ref Range Status   SARS Coronavirus 2 NEGATIVE NEGATIVE Final    Comment: (NOTE) SARS-CoV-2 target nucleic acids are NOT DETECTED. The SARS-CoV-2 RNA is generally detectable in upper and lower respiratory specimens during the acute phase of infection. Negative results do not preclude SARS-CoV-2 infection, do not rule out co-infections with other pathogens, and should not be used as the sole basis for treatment or other patient management decisions. Negative results must be combined with clinical observations, patient history, and epidemiological information. The expected result is Negative. Fact Sheet for Patients: SugarRoll.be Fact Sheet for Healthcare Providers: https://www.woods-mathews.com/ This test is not yet approved or cleared by the [place] FDA and  has been authorized for detection and/or diagnosis of SARS-CoV-2  by FDA under an Emergency Use Authorization (EUA). This EUA will remain  in effect (meaning this test can be used) for the duration of the COVID-19 declaration under Section 56 4(b)(1) of the Act, 21 U.S.C. section 360bbb-3(b)(1), unless the authorization is terminated or revoked sooner. Performed at [facility], [place] [address] [place], [place] [address]     Coagulation Studies: No results for input(s): LABPROT, INR in the last 72 hours.  Urinalysis: No  results for input(s): COLORURINE, LABSPEC, PHURINE, GLUCOSEU, HGBUR, BILIRUBINUR, KETONESUR, PROTEINUR, UROBILINOGEN, NITRITE, LEUKOCYTESUR in the last 72 hours.  Invalid input(s): APPERANCEUR    Imaging: CT Head Wo Contrast  Result Date: 12/19/2019 CLINICAL DATA:  Seizure EXAM: CT HEAD WITHOUT CONTRAST TECHNIQUE: Contiguous axial images were obtained from the base of the skull through the vertex without intravenous contrast. COMPARISON:  MRI 12/12/2016 FINDINGS: Brain: No acute territorial infarction, hemorrhage or intracranial mass. The ventricles are nonenlarged. Vascular: No hyperdense vessel or unexpected calcification. Skull: Normal. Negative for fracture or focal lesion. Sinuses/Orbits: No acute finding. Other: None IMPRESSION: Negative non contrasted CT appearance of the brain Electronically Signed   By: Donavan Foil M.D.   On: 12/19/2019 21:08     Medications:   . sodium chloride 75 mL/hr (12/20/19 0747)  . levETIRAcetam Stopped (12/20/19 0049)    acetaminophen **OR** acetaminophen, hydrALAZINE, LORazepam, ondansetron **OR** ondansetron (ZOFRAN) IV  Assessment/ Plan:  Patricia Maldonado is a 39 y.o. black female with end stage renal disease, lupus nephritis, hypertension, nephrolithiasis who is admitted to [facility] on 12/19/2019 for Seizure ([place]) [R56.9] Seizure disorder ([place]) [E95.284]  [facility] MWF [place]. RIJ permcath 41kg.   1. End Stage Renal Disease: placed on dialysis this morning but patient being noncooperative and unable to follow commands or answer questions. Patient is most likely having post-ictal complications from seizure even.  - Will stop dialysis and reassess for dialysis treatment later today.   2. Hypertension: 148/101 - agitated.  3. Anemia of chronic kidney disease: hemoglobin 12.5, macrocytic. No indication of for ESA.   4. Secondary Hyperparathyroidism: - sevelamer with meals when able to take PO  5. Seizure: according to family,  first seizure was on 3/7. Appreciate neurology input    LOS: 1 Gregg Holster 3/19/20219:47 AM

## 2019-12-20 NOTE — ED Notes (Signed)
Pt to go to dialysis at this time. EDT Dorian to transport pt and stay during treatment

## 2019-12-20 NOTE — Progress Notes (Signed)
eeg completed ° °

## 2019-12-20 NOTE — Progress Notes (Signed)
This note also relates to the following rows which could not be included: Pulse Rate - Cannot attach notes to unvalidated device data SpO2 - Cannot attach notes to unvalidated device data  Pt was to restless and moving in bed non stop to continue HD on. Trying to turn over inn bed on top of HD lines. Exposing self to all. Dr Juleen China notified and orders to stop HD now due to pts non cooperation.

## 2019-12-20 NOTE — ED Notes (Signed)
Mom of pt at bedside and will update on plan of care.

## 2019-12-20 NOTE — Progress Notes (Signed)
[place] at [place] NAME: Patricia Maldonado    MR#:  [id number]  DATE OF BIRTH:  [birth year]  SUBJECTIVE:  CHIEF COMPLAINT:   Chief Complaint  Patient presents with  . Seizures  was post-ictal in ED, mother at bedside concerned REVIEW OF SYSTEMS:  ROSunable to obtain due to mental status DRUG ALLERGIES:   Allergies  Allergen Reactions  . Penicillins Swelling    Yeast infection Has patient had a PCN reaction causing immediate rash, facial/tongue/throat swelling, SOB or lightheadedness with hypotension:No Has patient had a PCN reaction causing severe rash involving mucus membranes or skin necrosis: Yes Has patient had a PCN reaction that required hospitalization No Has patient had a PCN reaction occurring within the last 10 years: No If all of the above answers are "NO", then may proceed with Cephalosporin use.  Yeast infection Has patient had a PCN reaction causing immediate rash, facial/tongue/throat swelling, SOB or lightheadedness with hypotension:No Has patient had a PCN reaction causing severe rash involving mucus membranes or skin necrosis: Yes Has patient had a PCN reaction that required hospitalization No Has patient had a PCN reaction occurring within the last 10 years: No If all of the above answers are "NO", then may proceed with Cephalosporin use.   VITALS:  Blood pressure (!) 149/102, pulse 68, temperature 97.8 F (36.6 C), temperature source Axillary, resp. rate 17, height 5\' 2"  (1.575 m), weight 44 kg, last menstrual period 12/02/2019, SpO2 98 %. PHYSICAL EXAMINATION:  Physical Exam seems comfortable. Eyes: PERLA, EOMI, irises appear normal, anicteric sclera,  ENMT: external ears and nose appear normal            Lips appears normal, oropharynx mucosa, tongue, posterior pharynx appear normal  Neck: neck appears normal, no masses, normal ROM, no thyromegaly, no JVD  CVS: S1-S2 clear, no murmur rubs or gallops,  , no carotid bruits, pedal  pulses palpable, No LE edema Respiratory:  clear to auscultation bilaterally, no wheezing, rales or rhonchi. Respiratory effort normal. No accessory muscle use.  Abdomen: soft nontender, nondistended, normal bowel sounds, no hepatosplenomegaly, no hernias Musculoskeletal: : no cyanosis, clubbing Neuro: Grossly intact.  Obtunded/postictal Psych: Unable to assess due to medication related somnolence/postictal Skin: no rashes or lesions or ulcers, no induration or nodules LABORATORY PANEL:  Female CBC Recent Labs  Lab 12/19/19 1743  WBC 3.8*  HGB 12.5  HCT 40.8  PLT 44*   ------------------------------------------------------------------------------------------------------------------ Chemistries  Recent Labs  Lab 12/19/19 1743  NA 138  K 5.0  CL 99  CO2 19*  GLUCOSE 99  BUN 66*  CREATININE 18.85*  CALCIUM 8.0*  AST 59*  ALT 31  ALKPHOS 76  BILITOT 0.7   RADIOLOGY:  EEG  Result Date: 12/20/2019 Alexis Goodell, MD     12/20/2019  3:24 PM ELECTROENCEPHALOGRAM REPORT Patient: [place] #: [id number] EEG No. ID: [id number] Age: 39 y.o.        Sex: female Requesting Physician: Manuella Ghazi Report Date:  12/20/2019       Interpreting Physician: Alexis Goodell History: Chayse S Esquivel is an 39 y.o. female with recurrent seizure activity Medications: Keppra, Ativan Conditions of Recording:  This is a 21 channel routine scalp EEG performed with bipolar and monopolar montages arranged in accordance to the international 10/20 system of electrode placement. One channel was dedicated to EKG recording. The patient is in the awake, drowsy and asleep states. Description:  The patient is awake only briefly  when the waking background activity is noted to consist of a low voltage, symmetrical, fairly well organized, 8 Hz alpha activity, seen from the parieto-occipital and posterior temporal regions.  Low voltage fast activity, poorly organized, is seen anteriorly and is at times superimposed on more  posterior regions.  A mixture of theta and alpha rhythms are seen from the central and temporal regions. The patient drowses with slowing to irregular, low voltage theta and beta activity.  The patient appears to be in drowse for the majority of the recording.  The patient goes in to a light sleep with symmetrical sleep spindles, vertex central sharp transients and irregular slow activity. No epileptiform activity is noted.  Hyperventilation and intermittent photic stimulation were not performed. IMPRESSION: Normal electroencephalogram, awake, asleep and with activation procedures. There are no focal lateralizing or epileptiform features. Alexis Goodell, MD Neurology [phone] 12/20/2019, 3:21 PM   CT Head Wo Contrast  Result Date: 12/19/2019 CLINICAL DATA:  Seizure EXAM: CT HEAD WITHOUT CONTRAST TECHNIQUE: Contiguous axial images were obtained from the base of the skull through the vertex without intravenous contrast. COMPARISON:  MRI 12/12/2016 FINDINGS: Brain: No acute territorial infarction, hemorrhage or intracranial mass. The ventricles are nonenlarged. Vascular: No hyperdense vessel or unexpected calcification. Skull: Normal. Negative for fracture or focal lesion. Sinuses/Orbits: No acute finding. Other: None IMPRESSION: Negative non contrasted CT appearance of the brain Electronically Signed   By: Donavan Foil M.D.   On: 12/19/2019 21:08   ASSESSMENT AND PLAN:  39 year old female with a history of HTN and lupus with ESRD on HD admitted with with recurrent seizures.    Acute repetitive seizure [facility]) -Patient had first seizure on 12/08/2019.  Had 2 seizures on the day of arrival -Continue Keppra 500 mg twice daily -Ativan as needed seizure -Seizure, fall and aspiration precautions -N.p.o. except for sips until awake enough -Neurology input appreciated -EEG neg - MRI from 3/12 shows no acute changes.  CT head negative on 3/18 for acute pathology    ESRD on hemodialysis [facility]) -Nephrology  for continuation of dialysis, was only able to dialyze for 20 mins as she got agitated    Thrombocytopenia ([place])   Systemic lupus erythematosus ([place]) -Patient with thrombocytopenia for the past year, possibly related to her lupus -DVT prophylaxis with SCDs -No evidence of active bleeding but continue to monitor.    Essential hypertension -Not currently on antihypertensives.  Continue to monitor for need  Sacral pain with history of Closed sacral fracture 12/08/2019 [facility]) -Recent history of sacral fracture related to fall onto her buttock when she had first seizure on 12/08/2019 -Pain control     DVT prophylaxis: SCD's Family Communication: Mother at bedside  disposition Plan: Back to home environment Barriers to DC -need to be seizure-free at least 24 hours, will need complete session of dialysis  All the records are reviewed and case discussed with Care Management/Social Worker. Management plans discussed with the patient, family and they are in agreement.  CODE STATUS: Full Code  TOTAL TIME TAKING CARE OF THIS PATIENT: 35 minutes.   More than 50% of the time was spent in counseling/coordination of care: YES  POSSIBLE D/C IN 1-2 DAYS, DEPENDING ON CLINICAL CONDITION.   Max Sane M.D on 12/20/2019 at 4:19 PM  [facility]   CC: Primary care physician; Letta Median, MD  Note: This dictation was prepared with Dragon dictation along with smaller phrase technology. Any transcriptional errors that result from this process are unintentional.

## 2019-12-20 NOTE — Progress Notes (Signed)
MEDICATION RELATED CONSULT NOTE - FOLLOW UP   Pharmacy Consult for Monitor for AED medications, DDI Indication: pt on Keppra  Allergies  Allergen Reactions  . Penicillins Swelling    Yeast infection Has patient had a PCN reaction causing immediate rash, facial/tongue/throat swelling, SOB or lightheadedness with hypotension:No Has patient had a PCN reaction causing severe rash involving mucus membranes or skin necrosis: Yes Has patient had a PCN reaction that required hospitalization No Has patient had a PCN reaction occurring within the last 10 years: No If all of the above answers are "NO", then may proceed with Cephalosporin use.  Yeast infection Has patient had a PCN reaction causing immediate rash, facial/tongue/throat swelling, SOB or lightheadedness with hypotension:No Has patient had a PCN reaction causing severe rash involving mucus membranes or skin necrosis: Yes Has patient had a PCN reaction that required hospitalization No Has patient had a PCN reaction occurring within the last 10 years: No If all of the above answers are "NO", then may proceed with Cephalosporin use.    Patient Measurements: Height: 5\' 2"  (157.5 cm) Weight: 97 lb (44 kg) IBW/kg (Calculated) : 50.1  Vital Signs: Temp: 98.1 F (36.7 C) (03/18 1742) Temp Source: Oral (03/18 1742) BP: 166/118 (03/18 2243) Pulse Rate: 65 (03/18 2243) Intake/Output from previous day: No intake/output data recorded. Intake/Output from this shift: No intake/output data recorded.  Labs: Recent Labs    12/19/19 1743  WBC 3.8*  HGB 12.5  HCT 40.8  PLT 44*  CREATININE 18.85*  ALBUMIN 3.6  PROT 7.5  AST 59*  ALT 31  ALKPHOS 76  BILITOT 0.7   Estimated Creatinine Clearance: 2.9 mL/min (A) (by C-G formula based on SCr of 18.85 mg/dL (H)).   Medications:  Scheduled:    Assessment: Patricia Maldonado is a 39 y.o. female with PMH as noted below including ESRD on dialysis as well as a recent new diagnosis of  seizure a few weeks ago who presents after an apparent seizure.  Per EMS, the patient told her family she did not feel well and then collapsed in the bathroom.  She was noted to have a generalized tonic-clonic seizure.  It lasted for several minutes, and then the patient was confused.   Plan:  Pharmacy will monitor for potential DDI with AED, monitor progress  Hart Robinsons A 12/20/2019,12:19 AM

## 2019-12-20 NOTE — ED Notes (Signed)
Pt still asleep at this time with this tech at bedside as 1:1 [employer] cabin crew.

## 2019-12-20 NOTE — ED Notes (Signed)
Mom at bedside and states skin rash is from pt's Lupus

## 2019-12-20 NOTE — ED Notes (Signed)
Pt has rash noted over entire body. Pt asked about this and doesn't know where it came from. Pt is still a little out of it and unsure of the questions this RN is asking.

## 2019-12-20 NOTE — Consult Note (Addendum)
Reason for Consult:Seizure Requesting Physician: Manuella Ghazi  CC: Seizure  I have been asked by Dr. Manuella Ghazi to see this patient in consultation for seizure.  HPI: Patricia Maldonado is an 39 y.o. female who is unable to provide any history due to recently receiving Ativan.  All history obtained from chart and mother.  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  Patient recently seen in the ER on 12/08/2019 for a first seizure with an unremarkable MRI of the brain.  Patient discharged with neurology appointment that has not yet occurred.  Returned to ER after she had a witnessed seizure at home lasting 5 minutes.  After presentation to the ED had another witnessed seizure.  Consult called for further recommendations.    Past Medical History:  Diagnosis Date  . Acute renal failure (ARF) ([place])   . Anemia   . Arthritis   . History of kidney stones   . Hypertension    OFF BP MEDS X 3 WEEKS DUE TO BP CONTROL  . 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 Rash   . Staghorn calculus   . 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 CYSTOSCOPY/URETEROSCOPY/HOLMIUM LASER/STENT PLACEMENT Right 02/21/2017   Procedure: CYSTOSCOPY/URETEROSCOPY/HOLMIUM LASER/STENT EXCHANGE;  Surgeon: [name], MD;  Location: [facility] ORS;  Service: Urology;  Laterality: Right;  . DIALYSIS/PERMA CATHETER INSERTION N/A 11/30/2018   Procedure: DIALYSIS/PERMA CATHETER INSERTION;  Surgeon: Algernon Huxley, MD;  Location: [facility];  Service: Cardiovascular;  Laterality: N/A;  . IR NEPHROSTOMY PLACEMENT RIGHT  01/23/2017  . NEPHROLITHOTOMY Right 01/24/2017   Procedure: NEPHROLITHOTOMY PERCUTANEOUS;  Surgeon: [name], MD;  Location: [facility];  Service: Urology;  Laterality: Right;  . PERITONEAL CATHETER INSERTION     . TUBAL LIGATION      Family History  Problem Relation Age of Onset  . Thyroid disease Mother   . Thyroid disease Father   . Diabetes Father     Social History:  reports that she has been smoking cigarettes. She has a 3.75 pack-year smoking history. She has never used smokeless tobacco. She reports current drug use. Drug: Marijuana. She reports that she does not drink alcohol.  Allergies  Allergen Reactions  . Penicillins Swelling    Yeast infection Has patient had a PCN reaction causing immediate rash, facial/tongue/throat swelling, SOB or lightheadedness with hypotension:No Has patient had a PCN reaction causing severe rash involving mucus membranes or skin necrosis: Yes Has patient had a PCN reaction that required hospitalization No Has patient had a PCN reaction occurring within the last 10 years: No If all of the above answers are "NO", then may proceed with Cephalosporin use.  Yeast infection Has patient had a PCN reaction causing immediate rash, facial/tongue/throat swelling, SOB or lightheadedness with hypotension:No Has patient had a PCN reaction causing severe rash involving mucus membranes or skin necrosis: Yes Has patient had a PCN reaction that required hospitalization No Has patient had a PCN reaction occurring within the last 10 years: No If all of the above answers are "NO", then may proceed with Cephalosporin use.    Medications: I have reviewed the patient's current medications. Prior to Admission medications   Medication Sig Start Date End Date Taking? Authorizing Provider  acetaminophen (TYLENOL) 500 MG tablet Take 500 mg by  mouth every 6 (six) hours as needed for headache.   Yes [provider]  B Complex-C-Folic Acid (RENA-VITE RX) 1 MG TABS Take 1 tablet by mouth Every Tuesday,Thursday,and Saturday with dialysis.    Yes [provider]  gentamicin cream (GARAMYCIN) 0.1 % Apply 1 application topically daily. (apply to access site) 11/01/19   Yes [provider]  mycophenolate (CELLCEPT) 500 MG tablet Take 2 tablets (1,000 mg total) by mouth 2 (two) times daily. 04/30/18  Yes Sudini, Alveta Heimlich, MD  oxyCODONE-acetaminophen (PERCOCET) 5-325 MG tablet Take 1 tablet by mouth every 6 (six) hours as needed for severe pain. 12/08/19 12/07/20 Yes Carrie Mew, MD  RENAGEL 800 MG tablet Take 800 mg by mouth in the morning, at noon, in the evening, and at bedtime. (at meals at with a snack)   Yes [provider]    ROS: Unable to provide due to sedation  Physical Examination: Blood pressure (!) 151/99, pulse 75, temperature 97.8 F (36.6 C), temperature source Axillary, resp. rate (!) 21, height 5\' 2"  (1.575 m), weight 44 kg, last menstrual period 12/02/2019, SpO2 100 %.  HEENT-  Normocephalic, no lesions, without obvious abnormality.  Normal external eye and conjunctiva.  Normal TM's bilaterally.  Normal auditory canals and external ears. Normal external nose, mucus membranes and septum.  Normal pharynx. Cardiovascular- S1, S2 normal, pulses palpable throughout   Lungs- chest clear, no wheezing, rales, normal symmetric air entry Abdomen- soft, non-tender; bowel sounds normal; no masses,  no organomegaly Extremities- no edema Lymph-no adenopathy palpable Musculoskeletal-no joint tenderness, deformity or swelling Skin-warm and dry, no hyperpigmentation, vitiligo, or suspicious lesions  Neurological Examination   Mental Status: Lethargic.  Does not respond verbally.  Follows simple commands.   Cranial Nerves: II: Blinks to bilateral confrontation, pupils equal, round, reactive to light and accommodation III,IV, VI: Extra-ocular motions grossly intact bilaterally V,VII: face symmetric VIII: hearing normal bilaterally IX,X: unable to test XI: unable to test XII: midline tongue extension Motor: Moves all extremities spontaneously against gravity.  5/5 hand grips bilaterally Sensory: Responds to light noxious stimuli  throughout Deep Tendon Reflexes: Symmetric throughout Plantars: Right: mute   Left: mute Cerebellar: unable to test Gait: not tested due to safety concerns    Laboratory Studies:   Basic Metabolic Panel: Recent Labs  Lab 12/16/19 1403 12/19/19 1743  NA 137 138  K 3.6 5.0  CL 93* 99  CO2 27 19*  GLUCOSE 91 99  BUN 36* 66*  CREATININE 10.75* 18.85*  CALCIUM 8.8* 8.0*    Liver Function Tests: Recent Labs  Lab 12/19/19 1743  AST 59*  ALT 31  ALKPHOS 76  BILITOT 0.7  PROT 7.5  ALBUMIN 3.6   No results for input(s): LIPASE, AMYLASE in the last 168 hours. No results for input(s): AMMONIA in the last 168 hours.  CBC: Recent Labs  Lab 12/16/19 1403 12/19/19 1743  WBC 3.2* 3.8*  NEUTROABS  --  2.9  HGB 13.7 12.5  HCT 44.1 40.8  MCV 100.0 101.2*  PLT 36* 44*    Cardiac Enzymes: No results for input(s): CKTOTAL, CKMB, CKMBINDEX, TROPONINI in the last 168 hours.  BNP: Invalid input(s): POCBNP  CBG: Recent Labs  Lab 12/16/19 4967  RFFMBW 46    Microbiology: Results for orders placed or performed during the hospital encounter of 09/20/19  SARS CORONAVIRUS 2 (TAT 6-24 HRS) Nasopharyngeal Nasopharyngeal Swab     Status: None   Collection Time: 09/20/19 10:14 AM   Specimen: Nasopharyngeal Swab  Result Value Ref Range Status   SARS Coronavirus 2 NEGATIVE NEGATIVE Final    Comment: (NOTE) SARS-CoV-2 target nucleic acids are NOT DETECTED. The SARS-CoV-2 RNA is generally detectable in upper and lower respiratory specimens during the acute phase of infection. Negative results do not preclude SARS-CoV-2 infection, do not rule out co-infections with other pathogens, and should not be used as the sole basis for treatment or other patient management decisions. Negative results must be combined with clinical observations, patient history, and epidemiological information. The expected result is Negative. Fact Sheet for  Patients: SugarRoll.be Fact Sheet for Healthcare Providers: https://www.woods-mathews.com/ This test is not yet approved or cleared by the [place] FDA and  has been authorized for detection and/or diagnosis of SARS-CoV-2 by FDA under an Emergency Use Authorization (EUA). This EUA will remain  in effect (meaning this test can be used) for the duration of the COVID-19 declaration under Section 56 4(b)(1) of the Act, 21 U.S.C. section 360bbb-3(b)(1), unless the authorization is terminated or revoked sooner. Performed at [facility], [place] [address], [place], [place] [address]     Coagulation Studies: No results for input(s): LABPROT, INR in the last 72 hours.  Urinalysis: No results for input(s): COLORURINE, LABSPEC, PHURINE, GLUCOSEU, HGBUR, BILIRUBINUR, KETONESUR, PROTEINUR, UROBILINOGEN, NITRITE, LEUKOCYTESUR in the last 168 hours.  Invalid input(s): APPERANCEUR  Lipid Panel:  No results found for: CHOL, TRIG, HDL, CHOLHDL, VLDL, LDLCALC  HgbA1C: No results found for: HGBA1C  Urine Drug Screen:      Component Value Date/Time   LABOPIA NONE DETECTED 12/13/2019 0938   COCAINSCRNUR NONE DETECTED 12/13/2019 0938   LABBENZ NONE DETECTED 12/13/2019 0938   AMPHETMU NONE DETECTED 12/13/2019 0938   THCU POSITIVE (A) 12/13/2019 0938   LABBARB NONE DETECTED 12/13/2019 0938    Alcohol Level: No results for input(s): ETH in the last 168 hours.  Other results: EKG: sinus rhythm at 68 bpm.  Imaging: CT Head Wo Contrast  Result Date: 12/19/2019 CLINICAL DATA:  Seizure EXAM: CT HEAD WITHOUT CONTRAST TECHNIQUE: Contiguous axial images were obtained from the base of the skull through the vertex without intravenous contrast. COMPARISON:  MRI 12/12/2016 FINDINGS: Brain: No acute territorial infarction, hemorrhage or intracranial mass. The ventricles are nonenlarged. Vascular: No hyperdense vessel or unexpected calcification. Skull: Normal.  Negative for fracture or focal lesion. Sinuses/Orbits: No acute finding. Other: None IMPRESSION: Negative non contrasted CT appearance of the brain Electronically Signed   By: Donavan Foil M.D.   On: 12/19/2019 21:08     Assessment/Plan: 39 year old female with a history of HTN and lupus with ESRD on HD who presents with recurrent seizures.  Unclear if seizures provoked.  Renal function worse than baseline from review of labs.  MRI from 3/12 personally reviewed and shows no acute changes. Patient started on Keppra.    Recommendations: 1. Would continue maintenance Keppra at 500mg  daily.  Would administer in the evenings so that it can be repleted after her dialysis days.    2. EEG 3. Serum complements, magnesium and phosphorus  Alexis Goodell, MD Neurology [phone] 12/20/2019, 2:06 PM

## 2019-12-20 NOTE — ED Notes (Signed)
Sitter at bedside, pt resting comfortably, respirations even and unlabored. Pt on monitor and IV fluids going at this time

## 2019-12-20 NOTE — ED Notes (Addendum)
This tech at bedside as 1:1 Air cabin crew. Pt asleep at this time.

## 2019-12-20 NOTE — Progress Notes (Signed)
HD started. 

## 2019-12-20 NOTE — Progress Notes (Signed)
PRE HD   

## 2019-12-20 NOTE — ED Notes (Signed)
Received call from Dialysis, pt unable to maintain still and complete treatment. MD Kolluru at bedside and states to dialysis it is too dangerous and for the pt to go back to ED.  Pt to come back with EDT Dorian shortly

## 2019-12-20 NOTE — ED Notes (Signed)
Pt calm and cooperative at this time. Pt's mother sitting at bedside. Pt denies toileting needs, pt dry at this time. Repositioned, call bell within reach, stretcher locked in lowest position

## 2019-12-20 NOTE — ED Notes (Signed)
Pt in bed asleep at this time; this tech remains at bedside as 1:1 sitter.

## 2019-12-20 NOTE — ED Notes (Addendum)
This tech in room as 1:1 sitter since 0700. Pt sleeping until now. Pt grunting and able to slowly roll off R side and onto L side. Pt seems groggy but is able to follow simple commands to ensure IV line safety and monitor cable maintenance. Pt sleeping again.

## 2019-12-20 NOTE — ED Notes (Addendum)
Pt back from Dialysis at this time. EDt Dorian at bedside. Pt seems a little agitation and restless.

## 2019-12-20 NOTE — Progress Notes (Signed)
HD ended 

## 2019-12-20 NOTE — ED Notes (Signed)
Pt stated she needed to use the bathroom.  Once on the bedpan, the pt went back to sleep.  This tech reminded the pt to void multiple times before removing the bed pan

## 2019-12-21 LAB — CBC
HCT: 36.1 % (ref 36.0–46.0)
Hemoglobin: 11.1 g/dL — ABNORMAL LOW (ref 12.0–15.0)
MCH: 31.1 pg (ref 26.0–34.0)
MCHC: 30.7 g/dL (ref 30.0–36.0)
MCV: 101.1 fL — ABNORMAL HIGH (ref 80.0–100.0)
Platelets: 44 10*3/uL — ABNORMAL LOW (ref 150–400)
RBC: 3.57 MIL/uL — ABNORMAL LOW (ref 3.87–5.11)
RDW: 15.1 % (ref 11.5–15.5)
WBC: 3.9 10*3/uL — ABNORMAL LOW (ref 4.0–10.5)
nRBC: 0 % (ref 0.0–0.2)

## 2019-12-21 LAB — COMPREHENSIVE METABOLIC PANEL
ALT: 21 U/L (ref 0–44)
AST: 36 U/L (ref 15–41)
Albumin: 3 g/dL — ABNORMAL LOW (ref 3.5–5.0)
Alkaline Phosphatase: 65 U/L (ref 38–126)
Anion gap: 19 — ABNORMAL HIGH (ref 5–15)
BUN: 64 mg/dL — ABNORMAL HIGH (ref 6–20)
CO2: 19 mmol/L — ABNORMAL LOW (ref 22–32)
Calcium: 8.1 mg/dL — ABNORMAL LOW (ref 8.9–10.3)
Chloride: 104 mmol/L (ref 98–111)
Creatinine, Ser: 19.59 mg/dL — ABNORMAL HIGH (ref 0.44–1.00)
GFR calc Af Amer: 2 mL/min — ABNORMAL LOW (ref >60)
GFR calc non Af Amer: 2 mL/min — ABNORMAL LOW (ref >60)
Glucose, Bld: 65 mg/dL — ABNORMAL LOW (ref 70–99)
Potassium: 4.9 mmol/L (ref 3.5–5.1)
Sodium: 142 mmol/L (ref 135–145)
Total Bilirubin: 1 mg/dL (ref 0.3–1.2)
Total Protein: 6.4 g/dL — ABNORMAL LOW (ref 6.5–8.1)

## 2019-12-21 LAB — GLUCOSE, CAPILLARY: Glucose-Capillary: 62 mg/dL — ABNORMAL LOW (ref 70–99)

## 2019-12-21 MED ORDER — METOPROLOL TARTRATE 25 MG PO TABS
25.0000 mg | ORAL_TABLET | Freq: Two times a day (BID) | ORAL | Status: DC
  Administered 2019-12-21: 25 mg via ORAL
  Filled 2019-12-21: qty 1

## 2019-12-21 MED ORDER — CHLORHEXIDINE GLUCONATE CLOTH 2 % EX PADS: 6.0000 | MEDICATED_PAD | Freq: Every day | CUTANEOUS | Status: DC

## 2019-12-21 MED ORDER — NEPRO/CARBSTEADY PO LIQD: 237.0000 mL | Freq: Two times a day (BID) | ORAL | Status: DC

## 2019-12-21 MED ORDER — LABETALOL HCL 5 MG/ML IV SOLN
10.0000 mg | Freq: Once | INTRAVENOUS | Status: AC
  Administered 2019-12-21: 01:00:00 10 mg via INTRAVENOUS
  Filled 2019-12-21: qty 4

## 2019-12-21 MED ORDER — LEVETIRACETAM 500 MG PO TABS
500.0000 mg | ORAL_TABLET | Freq: Every day | ORAL | Status: DC
  Administered 2019-12-21: 500 mg via ORAL
  Filled 2019-12-21 (×2): qty 1

## 2019-12-21 NOTE — Progress Notes (Signed)
[place], [place] 12/21/19  Subjective:   Hospital day # 2   Patient presented to the hospital via EMS from home due to seizure.  She collapsed in the bathroom and had a tonic-clonic seizure.  She could not finish her dialysis yesterday because of altered mental status and was uncooperative.  Patient is being followed by neurology.  No seizures overnight. Today, patient remains somewhat lethargic but cooperative Able to answer few simple questions Oral intake appears to be very poor   Objective:  Vital signs in last 24 hours:  Temp:  [97.8 F (36.6 C)-98.8 F (37.1 C)] 98.2 F (36.8 C) (03/20 1052) Pulse Rate:  [71-113] 91 (03/20 1052) Resp:  [18-19] 18 (03/20 1052) BP: (143-163)/(95-114) 143/103 (03/20 1052) SpO2:  [95 %-100 %] 99 % (03/20 1052)  Weight change: 0 kg Filed Weights   12/19/19 1732 12/20/19 0745  Weight: 44 kg 44 kg    Intake/Output:    Intake/Output Summary (Last 24 hours) at 12/21/2019 1355 Last data filed at 12/21/2019 0307 Gross per 24 hour  Intake 1330.68 ml  Output --  Net 1330.68 ml     Physical Exam: General:  No acute distress, laying in the bed  HEENT  moist oral mucous membranes  Pulm/lungs  normal breathing effort room air, clear to auscultation  CVS/Heart  no rub or gallop  Abdomen:   Soft, nontender  Extremities:  No peripheral edema  Neurologic:  Lethargic but able to answer few simple questions  Skin:  Warm, dry  Access:  Right IJ PermCath       Basic Metabolic Panel:  Recent Labs  Lab 12/16/19 1403 12/19/19 1743 12/20/19 1701 12/21/19 0632  NA 137 138  --  142  K 3.6 5.0  --  4.9  CL 93* 99  --  104  CO2 27 19*  --  19*  GLUCOSE 91 99  --  65*  BUN 36* 66*  --  64*  CREATININE 10.75* 18.85*  --  19.59*  CALCIUM 8.8* 8.0*  --  8.1*  MG  --   --  2.0  --   PHOS  --   --  8.4*  --      CBC: Recent Labs  Lab 12/16/19 1403 12/19/19 1743 12/21/19 0632  WBC 3.2* 3.8* 3.9*   NEUTROABS  --  2.9  --   HGB 13.7 12.5 11.1*  HCT 44.1 40.8 36.1  MCV 100.0 101.2* 101.1*  PLT 36* 44* 44*     No results found for: HEPBSAG, HEPBSAB, HEPBIGM    Microbiology:  No results found for this or any previous visit (from the past 240 hour(s)).  Coagulation Studies: No results for input(s): LABPROT, INR in the last 72 hours.  Urinalysis: No results for input(s): COLORURINE, LABSPEC, PHURINE, GLUCOSEU, HGBUR, BILIRUBINUR, KETONESUR, PROTEINUR, UROBILINOGEN, NITRITE, LEUKOCYTESUR in the last 72 hours.  Invalid input(s): APPERANCEUR    Imaging: EEG  Result Date: 12/20/2019 Alexis Goodell, MD     12/20/2019  3:24 PM ELECTROENCEPHALOGRAM REPORT Patient: Patricia Maldonado       Room #: ED03A EEG No. ID: [id number] Age: 39 y.o.        Sex: female Requesting Physician: Manuella Ghazi Report Date:  12/20/2019       Interpreting Physician: Alexis Goodell History: Patricia Maldonado is an 39 y.o. female with recurrent seizure activity Medications: Keppra, Ativan Conditions of Recording:  This is a 21 channel routine scalp EEG performed with bipolar and monopolar  montages arranged in accordance to the international 10/20 system of electrode placement. One channel was dedicated to EKG recording. The patient is in the awake, drowsy and asleep states. Description:  The patient is awake only briefly when the waking background activity is noted to consist of a low voltage, symmetrical, fairly well organized, 8 Hz alpha activity, seen from the parieto-occipital and posterior temporal regions.  Low voltage fast activity, poorly organized, is seen anteriorly and is at times superimposed on more posterior regions.  A mixture of theta and alpha rhythms are seen from the central and temporal regions. The patient drowses with slowing to irregular, low voltage theta and beta activity.  The patient appears to be in drowse for the majority of the recording.  The patient goes in to a light sleep with symmetrical sleep  spindles, vertex central sharp transients and irregular slow activity. No epileptiform activity is noted.  Hyperventilation and intermittent photic stimulation were not performed. IMPRESSION: Normal electroencephalogram, awake, asleep and with activation procedures. There are no focal lateralizing or epileptiform features. Alexis Goodell, MD Neurology [phone] 12/20/2019, 3:21 PM   CT Head Wo Contrast  Result Date: 12/19/2019 CLINICAL DATA:  Seizure EXAM: CT HEAD WITHOUT CONTRAST TECHNIQUE: Contiguous axial images were obtained from the base of the skull through the vertex without intravenous contrast. COMPARISON:  MRI 12/12/2016 FINDINGS: Brain: No acute territorial infarction, hemorrhage or intracranial mass. The ventricles are nonenlarged. Vascular: No hyperdense vessel or unexpected calcification. Skull: Normal. Negative for fracture or focal lesion. Sinuses/Orbits: No acute finding. Other: None IMPRESSION: Negative non contrasted CT appearance of the brain Electronically Signed   By: Donavan Foil M.D.   On: 12/19/2019 21:08     Medications:   . sodium chloride 75 mL/hr (12/21/19 0307)   . levETIRAcetam  500 mg Oral Daily  . metoprolol tartrate  25 mg Oral BID   acetaminophen **OR** acetaminophen, hydrALAZINE, LORazepam, ondansetron **OR** ondansetron (ZOFRAN) IV  Assessment/ Plan:  39 y.o. female with with end stage renal disease, lupus nephritis, hypertension, nephrolithiasis  admitted on 12/19/2019 for Seizure [facility]) [R56.9] Seizure disorder ([place]) [G95.621]   [facility] MWF [place]. RIJ permcath 41kg.    #End-stage renal disease Electrolytes and volume status are acceptable No acute indication for dialysis Next dialysis is planned for Monday  #Hypertension Blood pressure is elevated Currently on metoprolol 25 mg twice a day  #Anemia of chronic kidney disease Lab Results  Component Value Date   HGB 11.1 (L) 12/21/2019   Will hold Epogen due to recent  seizure activity  #Secondary hyperparathyroidism Lab Results  Component Value Date   CALCIUM 8.1 (L) 12/21/2019   PHOS 8.4 (H) 12/20/2019   Phosphorus is elevated We will consider starting binders once able to eat normal diet  #Seizure Currently on Keppra Seizure precautions   LOS: 2 Patricia Maldonado 3/20/20211:55 PM  [place], [place]  Note: This note was prepared with Dragon dictation. Any transcription errors are unintentional

## 2019-12-21 NOTE — Progress Notes (Addendum)
Subjective: No seizures noted overnight.  Per mother patient back to baseline.  Patient expresses the wish to go home today.    Objective: Current vital signs: BP (!) 143/103 (BP Location: Right Arm)   Pulse 91   Temp 98.2 F (36.8 C) (Axillary)   Resp 18   Ht 5\' 2"  (1.575 m)   Wt 44 kg   LMP 12/02/2019 (Exact Date)   SpO2 99%   BMI 17.74 kg/m  Vital signs in last 24 hours: Temp:  [97.8 F (36.6 C)-98.8 F (37.1 C)] 98.2 F (36.8 C) (03/20 1052) Pulse Rate:  [68-113] 91 (03/20 1052) Resp:  [13-21] 18 (03/20 1052) BP: (143-163)/(95-114) 143/103 (03/20 1052) SpO2:  [95 %-100 %] 99 % (03/20 1052)  Intake/Output from previous day: 03/19 0701 - 03/20 0700 In: 1330.7 [I.V.:1230.7; IV Piggyback:100] Out: -  Intake/Output this shift: No intake/output data recorded. Nutritional status:  Diet Order            Diet renal with fluid restriction Fluid restriction: 1200 mL Fluid; Room service appropriate? Yes; Fluid consistency: Thin  Diet effective now              Neurologic Exam: Mental Status: Easily awakened.  Speech fluent.  Follows commands.   Cranial Nerves: II: Blinks to bilateral confrontation, pupils equal, round, reactive to light and accommodation III,IV, VI: Extra-ocular motions grossly intact bilaterally V,VII: face symmetric VIII: hearing normal bilaterally IX,X: gag reflex present XI: bilateral shoulder shrug XII: midline tongue extension Motor: Moves all extremities against gravity Sensory: Pinprick and light touch intact throughout, bilaterally   Lab Results: Basic Metabolic Panel: Recent Labs  Lab 12/16/19 1403 12/19/19 1743 12/20/19 1701 12/21/19 0632  NA 137 138  --  142  K 3.6 5.0  --  4.9  CL 93* 99  --  104  CO2 27 19*  --  19*  GLUCOSE 91 99  --  65*  BUN 36* 66*  --  64*  CREATININE 10.75* 18.85*  --  19.59*  CALCIUM 8.8* 8.0*  --  8.1*  MG  --   --  2.0  --   PHOS  --   --  8.4*  --     Liver Function Tests: Recent Labs   Lab 12/19/19 1743 12/21/19 0632  AST 59* 36  ALT 31 21  ALKPHOS 76 65  BILITOT 0.7 1.0  PROT 7.5 6.4*  ALBUMIN 3.6 3.0*   No results for input(s): LIPASE, AMYLASE in the last 168 hours. No results for input(s): AMMONIA in the last 168 hours.  CBC: Recent Labs  Lab 12/16/19 1403 12/19/19 1743 12/21/19 0632  WBC 3.2* 3.8* 3.9*  NEUTROABS  --  2.9  --   HGB 13.7 12.5 11.1*  HCT 44.1 40.8 36.1  MCV 100.0 101.2* 101.1*  PLT 36* 44* 44*    Cardiac Enzymes: No results for input(s): CKTOTAL, CKMB, CKMBINDEX, TROPONINI in the last 168 hours.  Lipid Panel: No results for input(s): CHOL, TRIG, HDL, CHOLHDL, VLDL, LDLCALC in the last 168 hours.  CBG: Recent Labs  Lab 12/16/19 1407 12/21/19 0816  GLUCAP 93 34*    Microbiology: Results for orders placed or performed during the hospital encounter of 09/20/19  SARS CORONAVIRUS 2 (TAT 6-24 HRS) Nasopharyngeal Nasopharyngeal Swab     Status: None   Collection Time: 09/20/19 10:14 AM   Specimen: Nasopharyngeal Swab  Result Value Ref Range Status   SARS Coronavirus 2 NEGATIVE NEGATIVE Final    Comment: (NOTE) SARS-CoV-2  target nucleic acids are NOT DETECTED. The SARS-CoV-2 RNA is generally detectable in upper and lower respiratory specimens during the acute phase of infection. Negative results do not preclude SARS-CoV-2 infection, do not rule out co-infections with other pathogens, and should not be used as the sole basis for treatment or other patient management decisions. Negative results must be combined with clinical observations, patient history, and epidemiological information. The expected result is Negative. Fact Sheet for Patients: SugarRoll.be Fact Sheet for Healthcare Providers: https://www.woods-mathews.com/ This test is not yet approved or cleared by the [place] FDA and  has been authorized for detection and/or diagnosis of SARS-CoV-2 by FDA under an Emergency  Use Authorization (EUA). This EUA will remain  in effect (meaning this test can be used) for the duration of the COVID-19 declaration under Section 56 4(b)(1) of the Act, 21 U.S.C. section 360bbb-3(b)(1), unless the authorization is terminated or revoked sooner. Performed at [facility], [place] [address] [place], [place] [address]     Coagulation Studies: No results for input(s): LABPROT, INR in the last 72 hours.  Imaging: EEG  Result Date: 12/20/2019 Alexis Goodell, MD     12/20/2019  3:24 PM ELECTROENCEPHALOGRAM REPORT Patient: Patricia Maldonado       Room #: ED03A EEG No. ID: [id number] Age: 39 y.o.        Sex: female Requesting Physician: Manuella Ghazi Report Date:  12/20/2019       Interpreting Physician: Alexis Goodell History: Patricia Maldonado is an 39 y.o. female with recurrent seizure activity Medications: Keppra, Ativan Conditions of Recording:  This is a 21 channel routine scalp EEG performed with bipolar and monopolar montages arranged in accordance to the international 10/20 system of electrode placement. One channel was dedicated to EKG recording. The patient is in the awake, drowsy and asleep states. Description:  The patient is awake only briefly when the waking background activity is noted to consist of a low voltage, symmetrical, fairly well organized, 8 Hz alpha activity, seen from the parieto-occipital and posterior temporal regions.  Low voltage fast activity, poorly organized, is seen anteriorly and is at times superimposed on more posterior regions.  A mixture of theta and alpha rhythms are seen from the central and temporal regions. The patient drowses with slowing to irregular, low voltage theta and beta activity.  The patient appears to be in drowse for the majority of the recording.  The patient goes in to a light sleep with symmetrical sleep spindles, vertex central sharp transients and irregular slow activity. No epileptiform activity is noted.  Hyperventilation and intermittent  photic stimulation were not performed. IMPRESSION: Normal electroencephalogram, awake, asleep and with activation procedures. There are no focal lateralizing or epileptiform features. Alexis Goodell, MD Neurology [phone] 12/20/2019, 3:21 PM   CT Head Wo Contrast  Result Date: 12/19/2019 CLINICAL DATA:  Seizure EXAM: CT HEAD WITHOUT CONTRAST TECHNIQUE: Contiguous axial images were obtained from the base of the skull through the vertex without intravenous contrast. COMPARISON:  MRI 12/12/2016 FINDINGS: Brain: No acute territorial infarction, hemorrhage or intracranial mass. The ventricles are nonenlarged. Vascular: No hyperdense vessel or unexpected calcification. Skull: Normal. Negative for fracture or focal lesion. Sinuses/Orbits: No acute finding. Other: None IMPRESSION: Negative non contrasted CT appearance of the brain Electronically Signed   By: Donavan Foil M.D.   On: 12/19/2019 21:08    Medications:  I have reviewed the patient's current medications. Scheduled: . metoprolol tartrate  25 mg Oral BID    Assessment/Plan: 38 year  old female with a history of HTN and lupus with ESRD on HD who presents with recurrent seizures.  Unclear if seizures provoked.  Renal function worse than baseline from review of labs.  Unable to receive dialysis.  MRI from 3/12 personally reviewed and shows no acute changes. Patient started on Keppra.  Phosphorus elevated.  Magnesium is normal.  Complements pending.   EEG shows no epileptiform activity.  No further seizures noted.  Recommendations: 1. Would continue maintenance Keppra at 500mg  daily.  Would administer in the evenings so that it can be repleted after her dialysis days.  May change to po.   2. Seizure precautions 3. Patient unable to drive, operate heavy machinery, perform activities at heights and participate in water activities until release by outpatient physician. 4. Follow up with neurology on an outpatient basis    LOS: 2 days   Alexis Goodell, MD Neurology [phone] 12/21/2019  10:58 AM

## 2019-12-21 NOTE — Plan of Care (Signed)
Continues to refuse education and states she wants to go home.

## 2019-12-21 NOTE — Progress Notes (Signed)
[facility] [place] NAME: Kayslee Furey    MR#:  [id number]  DATE OF BIRTH:  May 11, 1981  SUBJECTIVE:  CHIEF COMPLAINT:   Chief Complaint  Patient presents with   Seizures  Falls back to sleep easily, wakes up to verbal commands, no further seizure activity per nursing REVIEW OF SYSTEMS:  ROSunable to obtain due to mental status DRUG ALLERGIES:   Allergies  Allergen Reactions   Penicillins Swelling    Yeast infection Has patient had a PCN reaction causing immediate rash, facial/tongue/throat swelling, SOB or lightheadedness with hypotension:No Has patient had a PCN reaction causing severe rash involving mucus membranes or skin necrosis: Yes Has patient had a PCN reaction that required hospitalization No Has patient had a PCN reaction occurring within the last 10 years: No If all of the above answers are "NO", then may proceed with Cephalosporin use.  Yeast infection Has patient had a PCN reaction causing immediate rash, facial/tongue/throat swelling, SOB or lightheadedness with hypotension:No Has patient had a PCN reaction causing severe rash involving mucus membranes or skin necrosis: Yes Has patient had a PCN reaction that required hospitalization No Has patient had a PCN reaction occurring within the last 10 years: No If all of the above answers are "NO", then may proceed with Cephalosporin use.   VITALS:  Blood pressure (!) 143/114, pulse (!) 113, temperature 97.8 F (36.6 C), resp. rate 19, height 5\' 2"  (1.575 m), weight 44 kg, last menstrual period 12/02/2019, SpO2 100 %. PHYSICAL EXAMINATION:  Physical Exam seems comfortable. Eyes: PERLA, EOMI, irises appear normal, anicteric sclera,  ENMT: external ears and nose appear normal            Lips appears normal, oropharynx mucosa, tongue, posterior pharynx appear normal  Neck: neck appears normal, no masses, normal ROM, no thyromegaly, no JVD  CVS: S1-S2 clear, no murmur rubs or gallops,  , no  carotid bruits, pedal pulses palpable, No LE edema Respiratory:  clear to auscultation bilaterally, no wheezing, rales or rhonchi. Respiratory effort normal. No accessory muscle use.  Abdomen: soft nontender, nondistended, normal bowel sounds, no hepatosplenomegaly, no hernias Musculoskeletal: : no cyanosis, clubbing Neuro: Grossly intact.  Awake but falls back to sleep easily Psych:  Normal mood and affect Skin: no rashes or lesions or ulcers, no induration or nodules LABORATORY PANEL:  Female CBC Recent Labs  Lab 12/21/19 0632  WBC 3.9*  HGB 11.1*  HCT 36.1  PLT 44*   ------------------------------------------------------------------------------------------------------------------ Chemistries  Recent Labs  Lab 12/19/19 1743 12/20/19 1701 12/21/19 0632  NA   < >  --  142  K   < >  --  4.9  CL   < >  --  104  CO2   < >  --  19*  GLUCOSE   < >  --  65*  BUN   < >  --  64*  CREATININE   < >  --  19.59*  CALCIUM   < >  --  8.1*  MG  --  2.0  --   AST   < >  --  36  ALT   < >  --  21  ALKPHOS   < >  --  65  BILITOT   < >  --  1.0   < > = values in this interval not displayed.   RADIOLOGY:  EEG  Result Date: 12/20/2019 Alexis Goodell, MD     12/20/2019  3:24 PM ELECTROENCEPHALOGRAM  REPORT Patient: ZANETTA DEHAAN       Room #: EP32R EEG No. ID: [id number] Age: 39 y.o.        Sex: female Requesting Physician: Manuella Ghazi Report Date:  12/20/2019       Interpreting Physician: Alexis Goodell History: Cherisse S Kann is an 39 y.o. female with recurrent seizure activity Medications: Keppra, Ativan Conditions of Recording:  This is a 21 channel routine scalp EEG performed with bipolar and monopolar montages arranged in accordance to the international 10/20 system of electrode placement. One channel was dedicated to EKG recording. The patient is in the awake, drowsy and asleep states. Description:  The patient is awake only briefly when the waking background activity is noted to consist of a low  voltage, symmetrical, fairly well organized, 8 Hz alpha activity, seen from the parieto-occipital and posterior temporal regions.  Low voltage fast activity, poorly organized, is seen anteriorly and is at times superimposed on more posterior regions.  A mixture of theta and alpha rhythms are seen from the central and temporal regions. The patient drowses with slowing to irregular, low voltage theta and beta activity.  The patient appears to be in drowse for the majority of the recording.  The patient goes in to a light sleep with symmetrical sleep spindles, vertex central sharp transients and irregular slow activity. No epileptiform activity is noted.  Hyperventilation and intermittent photic stimulation were not performed. IMPRESSION: Normal electroencephalogram, awake, asleep and with activation procedures. There are no focal lateralizing or epileptiform features. Alexis Goodell, MD Neurology [phone] 12/20/2019, 3:21 PM   ASSESSMENT AND PLAN:  39 year old female with a history of HTN and lupus with ESRD on HD admitted with with recurrent seizures.    Acute repetitive seizure [facility]) -Patient had first seizure on 12/08/2019.  Had 2 seizures on the day of arrival -Continue Keppra 500 mg twice daily -Ativan as needed seizure -Seizure, fall and aspiration precautions -She is more awake today.  Will order renal diet if she is able to stay awake enough -Neurology input appreciated -EEG neg - MRI from 3/12 shows no acute changes.  CT head negative on 3/18 for acute pathology    ESRD on hemodialysis [facility]) -Nephrology for continuation of dialysis, was only able to dialyze for 20 mins on 3/19 as she got agitated    Thrombocytopenia ([place])   Systemic lupus erythematosus ([place]) -Patient with thrombocytopenia for the past year, possibly related to her lupus -DVT prophylaxis with SCDs -No evidence of active bleeding but continue to monitor.    Essential hypertension/sinus tachycardia -We will start  metoprolol 25 mg p.o. twice daily and adjust as needed  Sacral pain with history of Closed sacral fracture 12/08/2019 ([place]) -Recent history of sacral fracture related to fall onto her buttock when she had first seizure on 12/08/2019 -Pain control     DVT prophylaxis: SCD's Family Communication: Mother updated at bedside on 3/19.  Discussed with patient today disposition Plan: Back to home environment Barriers to DC -need to be seizure-free at least 24 hours, will also need to complete session of dialysis  All the records are reviewed and case discussed with Care Management/Social Worker. Management plans discussed with the patient, nursing and they are in agreement.  CODE STATUS: Full Code  TOTAL TIME TAKING CARE OF THIS PATIENT: 35 minutes.   More than 50% of the time was spent in counseling/coordination of care: YES  POSSIBLE D/C IN 1-2 DAYS, DEPENDING ON CLINICAL CONDITION.   Dylyn Mclaren Marguerite Olea.D  on 12/21/2019 at 10:06 AM  [facility]   CC: Primary care physician; Letta Median, MD  Note: This dictation was prepared with Dragon dictation along with smaller phrase technology. Any transcriptional errors that result from this process are unintentional.

## 2019-12-22 LAB — BASIC METABOLIC PANEL
Anion gap: 12 (ref 5–15)
BUN: 70 mg/dL — ABNORMAL HIGH (ref 6–20)
CO2: 24 mmol/L (ref 22–32)
Calcium: 8 mg/dL — ABNORMAL LOW (ref 8.9–10.3)
Chloride: 104 mmol/L (ref 98–111)
Creatinine, Ser: 20.64 mg/dL — ABNORMAL HIGH (ref 0.44–1.00)
GFR calc Af Amer: 2 mL/min — ABNORMAL LOW (ref >60)
GFR calc non Af Amer: 2 mL/min — ABNORMAL LOW (ref >60)
Glucose, Bld: 87 mg/dL (ref 70–99)
Potassium: 5.3 mmol/L — ABNORMAL HIGH (ref 3.5–5.1)
Sodium: 140 mmol/L (ref 135–145)

## 2019-12-22 LAB — CBC
HCT: 33.7 % — ABNORMAL LOW (ref 36.0–46.0)
HCT: 36.3 % (ref 36.0–46.0)
Hemoglobin: 10.7 g/dL — ABNORMAL LOW (ref 12.0–15.0)
Hemoglobin: 11.4 g/dL — ABNORMAL LOW (ref 12.0–15.0)
MCH: 31.5 pg (ref 26.0–34.0)
MCH: 31.8 pg (ref 26.0–34.0)
MCHC: 31.8 g/dL (ref 30.0–36.0)
MCHC: 31.4 g/dL (ref 30.0–36.0)
MCV: 99.1 fL (ref 80.0–100.0)
MCV: 101.4 fL — ABNORMAL HIGH (ref 80.0–100.0)
Platelets: 40 10*3/uL — ABNORMAL LOW (ref 150–400)
Platelets: 43 10*3/uL — ABNORMAL LOW (ref 150–400)
RBC: 3.4 MIL/uL — ABNORMAL LOW (ref 3.87–5.11)
RBC: 3.58 MIL/uL — ABNORMAL LOW (ref 3.87–5.11)
RDW: 15.4 % (ref 11.5–15.5)
RDW: 15.4 % (ref 11.5–15.5)
WBC: 3.3 10*3/uL — ABNORMAL LOW (ref 4.0–10.5)
WBC: 3.7 10*3/uL — ABNORMAL LOW (ref 4.0–10.5)
nRBC: 0 % (ref 0.0–0.2)
nRBC: 0 % (ref 0.0–0.2)

## 2019-12-22 LAB — C3 COMPLEMENT: C3 Complement: 78 mg/dL — ABNORMAL LOW (ref 82–167)

## 2019-12-22 LAB — C4 COMPLEMENT: Complement C4, Body Fluid: 16 mg/dL (ref 12–38)

## 2019-12-22 MED ORDER — LOSARTAN POTASSIUM 50 MG PO TABS: 50.0000 mg | ORAL_TABLET | Freq: Every evening | ORAL | Status: DC

## 2019-12-22 MED ORDER — LOSARTAN POTASSIUM 50 MG PO TABS: 50.0000 mg | ORAL_TABLET | Freq: Every evening | ORAL | 0 refills | Status: DC

## 2019-12-22 MED ORDER — LEVETIRACETAM 500 MG PO TABS: 500.0000 mg | ORAL_TABLET | Freq: Every day | ORAL | 0 refills | Status: DC

## 2019-12-22 NOTE — Progress Notes (Signed)
Post HD Tx Note  Pt continues to be on RA SPO2 100%. BP continues to be high but not different from baseline. Pt continues to be agitaged and anxious during Tx saying I don't want to be here, I want to go home. Tx stopped 20 min earlier than prescribed time. Prescribed UF of 1L was not met, only 520m of fluids removal. Tx run for 2hrs 438m on 2K2.5Ca for Potassium of 5.4. CVC new dressing is on. Limbs Heparin Locked closed and clamped   12/22/19 1455  Hand-Off documentation  Report given to (Full Name) AlCharlann BoxerN  Report received from (Full Name) DiNewt MinionN   Vital Signs  Temp 98 F (36.7 C)  Temp Source Oral  Pulse Rate 97  Pulse Rate Source Monitor  Resp 17  BP (!) 159/103  BP Location Left Arm  BP Method Automatic  Patient Position (if appropriate) Lying  Oxygen Therapy  SpO2 100 %  O2 Device Room Air  Post-Hemodialysis Assessment  Rinseback Volume (mL) 250 mL  KECN 38.9 V  Dialyzer Clearance Lightly streaked  Duration of HD Treatment -hour(s) 2.4 hour(s)  Hemodialysis Intake (mL) 500 mL  UF Total -Machine (mL) 510 mL  Net UF (mL) 10 mL  Tolerated HD Treatment Yes  Hemodialysis Catheter Right Subclavian  Placement Date: 12/20/19   Orientation: Right  Access Location: Subclavian  Site Condition No complications  Blue Lumen Status Heparin locked  Red Lumen Status Heparin locked  Purple Lumen Status N/A  Catheter fill solution Heparin 1000 units/ml  Catheter fill volume (Arterial) 1.5 cc  Catheter fill volume (Venous) 1.5  Dressing Type Biopatch  Dressing Status Antimicrobial disc changed;Dressing changed  Interventions New dressing;Dressing changed  Drainage Description None  Dressing Change Due 12/26/19  Post treatment catheter status Capped and Clamped

## 2019-12-22 NOTE — Progress Notes (Signed)
HD Tx Ended Pt became agitated and anxious saying I want to stand up, I want to leave the bed, and Pt kept moving and kneeling in bed jeopardizing the CVC blood line access and pulling lines, Tx stopped 20 min earlier than prescribed time   12/22/19 1445  Vital Signs  Pulse Rate 93  Resp 17  BP (!) 179/115  Oxygen Therapy  SpO2 100 %  O2 Device Room Air  During Hemodialysis Assessment  Blood Flow Rate (mL/min) 250 mL/min  Arterial Pressure (mmHg) -80 mmHg  Venous Pressure (mmHg) 80 mmHg  Transmembrane Pressure (mmHg) 50 mmHg  Ultrafiltration Rate (mL/min) 190 mL/min  Dialysate Flow Rate (mL/min) 600 ml/min  Conductivity: Machine  13.7  HD Safety Checks Performed Yes  Intra-Hemodialysis Comments Tx completed

## 2019-12-22 NOTE — Progress Notes (Signed)
[name], [place] 12/22/19  Subjective:   Hospital day # 3   Patient presented to the hospital via EMS from home due to seizure.  She collapsed in the bathroom and had a tonic-clonic seizure.  She could not finish her dialysis on Friday because of altered mental status and agitation.  This morning she is more calm Denies SOB She is tearful and states she is missing seeing her teenage kids   Objective:  Vital signs in last 24 hours:  Temp:  [98.2 F (36.8 C)-99.3 F (37.4 C)] 98.7 F (37.1 C) (03/21 0913) Pulse Rate:  [65-91] 65 (03/21 0913) Resp:  [16-18] 18 (03/21 0913) BP: (143-157)/(95-103) 153/101 (03/21 0913) SpO2:  [98 %-100 %] 98 % (03/21 0913)  Weight change:  Filed Weights   12/19/19 1732 12/20/19 0745  Weight: 44 kg 44 kg    Intake/Output:   No intake or output data in the 24 hours ending 12/22/19 0931   Physical Exam: General:  No acute distress, laying in the bed  HEENT  moist oral mucous membranes  Pulm/lungs  normal breathing effort room air, clear to auscultation  CVS/Heart  no rub or gallop  Abdomen:   Soft, nontender  Extremities:  No peripheral edema  Neurologic:  Alert and oriented  Skin:  Warm, dry, rash on face, scratch  Marks on arms  Access:  Right IJ PermCath       Basic Metabolic Panel:  Recent Labs  Lab 12/16/19 1403 12/16/19 1403 12/19/19 1743 12/20/19 1701 12/21/19 0632 12/22/19 0533  NA 137  --  138  --  142 140  K 3.6  --  5.0  --  4.9 5.3*  CL 93*  --  99  --  104 104  CO2 27  --  19*  --  19* 24  GLUCOSE 91  --  99  --  65* 87  BUN 36*  --  66*  --  64* 70*  CREATININE 10.75*  --  18.85*  --  19.59* 20.64*  CALCIUM 8.8*   < > 8.0*  --  8.1* 8.0*  MG  --   --   --  2.0  --   --   PHOS  --   --   --  8.4*  --   --    < > = values in this interval not displayed.     CBC: Recent Labs  Lab 12/16/19 1403 12/19/19 1743 12/21/19 0632 12/22/19 0533  WBC 3.2* 3.8* 3.9* 3.3*  NEUTROABS   --  2.9  --   --   HGB 13.7 12.5 11.1* 10.7*  HCT 44.1 40.8 36.1 33.7*  MCV 100.0 101.2* 101.1* 99.1  PLT 36* 44* 44* 40*     No results found for: HEPBSAG, HEPBSAB, HEPBIGM    Microbiology:  No results found for this or any previous visit (from the past 240 hour(s)).  Coagulation Studies: No results for input(s): LABPROT, INR in the last 72 hours.  Urinalysis: No results for input(s): COLORURINE, LABSPEC, PHURINE, GLUCOSEU, HGBUR, BILIRUBINUR, KETONESUR, PROTEINUR, UROBILINOGEN, NITRITE, LEUKOCYTESUR in the last 72 hours.  Invalid input(s): APPERANCEUR    Imaging: EEG  Result Date: 12/20/2019 Alexis Goodell, MD     12/20/2019  3:24 PM ELECTROENCEPHALOGRAM REPORT Patient: Patricia Maldonado       Room #: ED03A EEG No. ID: [id number] Age: 39 y.o.        Sex: female Requesting Physician: Manuella Ghazi Report Date:  12/20/2019  Interpreting Physician: Alexis Goodell History: Patricia Maldonado is an 39 y.o. female with recurrent seizure activity Medications: Keppra, Ativan Conditions of Recording:  This is a 21 channel routine scalp EEG performed with bipolar and monopolar montages arranged in accordance to the international 10/20 system of electrode placement. One channel was dedicated to EKG recording. The patient is in the awake, drowsy and asleep states. Description:  The patient is awake only briefly when the waking background activity is noted to consist of a low voltage, symmetrical, fairly well organized, 8 Hz alpha activity, seen from the parieto-occipital and posterior temporal regions.  Low voltage fast activity, poorly organized, is seen anteriorly and is at times superimposed on more posterior regions.  A mixture of theta and alpha rhythms are seen from the central and temporal regions. The patient drowses with slowing to irregular, low voltage theta and beta activity.  The patient appears to be in drowse for the majority of the recording.  The patient goes in to a light sleep with  symmetrical sleep spindles, vertex central sharp transients and irregular slow activity. No epileptiform activity is noted.  Hyperventilation and intermittent photic stimulation were not performed. IMPRESSION: Normal electroencephalogram, awake, asleep and with activation procedures. There are no focal lateralizing or epileptiform features. Alexis Goodell, MD Neurology [phone] 12/20/2019, 3:21 PM     Medications:   . sodium chloride 75 mL/hr (12/21/19 0307)   . Chlorhexidine Gluconate Cloth  6 each Topical Daily  . feeding supplement (NEPRO CARB STEADY)  237 mL Oral BID BM  . levETIRAcetam  500 mg Oral Daily  . losartan  50 mg Oral QPM  . metoprolol tartrate  25 mg Oral BID   acetaminophen **OR** acetaminophen, hydrALAZINE, LORazepam, ondansetron **OR** ondansetron (ZOFRAN) IV  Assessment/ Plan:  39 y.o. female with with end stage renal disease, lupus nephritis, hypertension, nephrolithiasis  admitted on 12/19/2019 for Seizure [facility]) [R56.9] Seizure disorder ([place]) [X41.287]   [facility]ph[facility] MWF [place]. RIJ permcath 41kg.    #End-stage renal disease Hyperkalemia noted today Patient has agreed to dialysis today Start usual schedule MWF starting tomorrow  #Hypertension Blood pressure is elevated Currently on metoprolol 25 mg twice a day Add losartan in evening   #Anemia of chronic kidney disease Lab Results  Component Value Date   HGB 10.7 (L) 12/22/2019   Will hold Epogen due to recent seizure activity  #Secondary hyperparathyroidism Lab Results  Component Value Date   CALCIUM 8.0 (L) 12/22/2019   PHOS 8.4 (H) 12/20/2019   Phosphorus is elevated We will consider starting binders once able to eat normal diet  #Seizure Currently on Keppra Seizure precautions   LOS: [place] 3/21/20219:31 AM  [facility] [place], [place]  Note: This note was prepared with Dragon dictation. Any transcription errors are  unintentional

## 2019-12-22 NOTE — Progress Notes (Signed)
HD Tx Started w/o complications   84/69/62 1150  Vital Signs  Temp 98.6 F (37 C)  Temp Source Oral  Pulse Rate 71  Pulse Rate Source Monitor  Resp (!) 22  BP (!) 183/119  BP Location Left Arm  BP Method Automatic  Patient Position (if appropriate) Lying  Oxygen Therapy  SpO2 100 %  O2 Device Room Air  During Hemodialysis Assessment  Blood Flow Rate (mL/min) 300 mL/min  Arterial Pressure (mmHg) -90 mmHg  Venous Pressure (mmHg) 90 mmHg  Transmembrane Pressure (mmHg) 50 mmHg  Ultrafiltration Rate (mL/min) 140 mL/min (UF reduced to 530ml instead of 1L Pt reprted to be dehydrate)  Dialysate Flow Rate (mL/min) 600 ml/min  Conductivity: Machine  13.8  HD Safety Checks Performed Yes  Dialysis Fluid Bolus Normal Saline  Bolus Amount (mL) 250 mL  Intra-Hemodialysis Comments Tx initiated

## 2019-12-22 NOTE — Discharge Summary (Signed)
[facility] NAME: Patricia Maldonado    MR#:  [id number]  DATE OF BIRTH:  [birth year]  DATE OF ADMISSION:  12/19/2019   ADMITTING PHYSICIAN: Athena Masse, MD  DATE OF DISCHARGE: 12/22/2019  5:08 PM  PRIMARY CARE PHYSICIAN: Letta Median, MD   ADMISSION DIAGNOSIS:  Seizure ([place]) [R56.9] Seizure disorder ([place]) [G40.909] DISCHARGE DIAGNOSIS:  Principal Problem:   Acute repetitive seizure ([place]) Active Problems:   Systemic lupus erythematosus ([place])   ESRD on hemodialysis ([place])   Essential hypertension   History of Closed sacral fracture ([place])   Thrombocytopenia ([place])   Seizure disorder ([place])  SECONDARY DIAGNOSIS:   Past Medical History:  Diagnosis Date  . Acute renal failure (ARF) ([place])   . Anemia   . Arthritis   . History of kidney stones   . Hypertension    OFF BP MEDS X 3 WEEKS DUE TO BP CONTROL  . 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 Rash   . Staghorn calculus   . 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[place]) 10/2016   DX DURING HOSPITAL ADMISSION   HOSPITAL COURSE:  39 year old female with a history of HTN and lupus with ESRD on HD admitted with with recurrent seizures.   Acute repetitive seizure [facility]) -Patient had first seizure on 12/08/2019. Had 2 seizures on the day of arrival -Unclear if seizures provoked.  - MRI brain on 3/12 shows no acute changes.  - EEG shows no epileptiform activity.  No further seizures while in the hospital - continue maintenance Keppra at 500mg  daily at discharge. Would administer in the evenings so that it can be repleted after her dialysis days.   - Patient unable to drive, operate heavy machinery, perform activities at heights and participate in water activities until release by outpatient physician. - Follow up with [facility] eurology on an outpatient basis   ESRD on hemodialysis ([place]) -Dialyzed on 3/21  Thrombocytopenia ([place]) Systemic lupus erythematosus ([place]) -Patient with  thrombocytopenia for the past year, possibly related to her lupus -No evidence of active bleeding and hemodynamically stable  Essential hypertension/sinus tachycardia -Better control.  Continue losartan as an outpatient  Sacral pain with history of Closed sacral fracture3/04/2020(HCC) -Recent history of sacral fracture related to fall onto her buttock when she had first seizure on 12/08/2019 -Try over-the-counter NSAIDs as needed DISCHARGE CONDITIONS:  Stable CONSULTS OBTAINED:  Treatment Team:  Catarina Hartshorn, MD DRUG ALLERGIES:   Allergies  Allergen Reactions  . Penicillins Swelling    Yeast infection Has patient had a PCN reaction causing immediate rash, facial/tongue/throat swelling, SOB or lightheadedness with hypotension:No Has patient had a PCN reaction causing severe rash involving mucus membranes or skin necrosis: Yes Has patient had a PCN reaction that required hospitalization No Has patient had a PCN reaction occurring within the last 10 years: No If all of the above answers are "NO", then may proceed with Cephalosporin use.  Yeast infection Has patient had a PCN reaction causing immediate rash, facial/tongue/throat swelling, SOB or lightheadedness with hypotension:No Has patient had a PCN reaction causing severe rash involving mucus membranes or skin necrosis: Yes Has patient had a PCN reaction that required hospitalization No Has patient had a PCN reaction occurring within the last 10 years: No If all of the above answers are "NO", then may proceed with Cephalosporin use.   DISCHARGE MEDICATIONS:   Allergies as of 12/22/2019  Reactions   Penicillins Swelling   Yeast infection Has patient had a PCN reaction causing immediate rash, facial/tongue/throat swelling, SOB or lightheadedness with hypotension:No Has patient had a PCN reaction causing severe rash involving mucus membranes or skin necrosis: Yes Has patient had a PCN reaction that required  hospitalization No Has patient had a PCN reaction occurring within the last 10 years: No If all of the above answers are "NO", then may proceed with Cephalosporin use. Yeast infection Has patient had a PCN reaction causing immediate rash, facial/tongue/throat swelling, SOB or lightheadedness with hypotension:No Has patient had a PCN reaction causing severe rash involving mucus membranes or skin necrosis: Yes Has patient had a PCN reaction that required hospitalization No Has patient had a PCN reaction occurring within the last 10 years: No If all of the above answers are "NO", then may proceed with Cephalosporin use.      Medication List    STOP taking these medications   oxyCODONE-acetaminophen 5-325 MG tablet Commonly known as: Percocet     TAKE these medications   acetaminophen 500 MG tablet Commonly known as: TYLENOL Take 500 mg by mouth every 6 (six) hours as needed for headache.   cinacalcet 30 MG tablet Commonly known as: SENSIPAR Take 30 mg by mouth daily.   gentamicin cream 0.1 % Commonly known as: GARAMYCIN Apply 1 application topically daily. (apply to access site)   levETIRAcetam 500 MG tablet Commonly known as: KEPPRA Take 1 tablet (500 mg total) by mouth daily.   losartan 50 MG tablet Commonly known as: COZAAR Take 1 tablet (50 mg total) by mouth every evening.   mycophenolate 500 MG tablet Commonly known as: CellCept Take 2 tablets (1,000 mg total) by mouth 2 (two) times daily.   Rena-Vite Rx 1 MG Tabs Take 1 tablet by mouth Every Tuesday,Thursday,and Saturday with dialysis.   Renagel 800 MG tablet Generic drug: sevelamer Take 800 mg by mouth in the morning, at noon, in the evening, and at bedtime. (at meals at with a snack)      DISCHARGE INSTRUCTIONS:   DIET:  Renal diet DISCHARGE CONDITION:  Stable ACTIVITY:  Activity as tolerated OXYGEN:  Home Oxygen: No.  Oxygen Delivery: room air DISCHARGE LOCATION:  home   If you experience  worsening of your admission symptoms, develop shortness of breath, life threatening emergency, suicidal or homicidal thoughts you must seek medical attention immediately by calling 911 or calling your MD immediately  if symptoms less severe.  You Must read complete instructions/literature along with all the possible adverse reactions/side effects for all the Medicines you take and that have been prescribed to you. Take any new Medicines after you have completely understood and accpet all the possible adverse reactions/side effects.   Please note  You were cared for by a hospitalist during your hospital stay. If you have any questions about your discharge medications or the care you received while you were in the hospital after you are discharged, you can call the unit and asked to speak with the hospitalist on call if the hospitalist that took care of you is not available. Once you are discharged, your primary care physician will handle any further medical issues. Please note that NO REFILLS for any discharge medications will be authorized once you are discharged, as it is imperative that you return to your primary care physician (or establish a relationship with a primary care physician if you do not have one) for your aftercare needs so that they can reassess  your need for medications and monitor your lab values.    On the day of Discharge:  VITAL SIGNS:  Blood pressure (!) 159/103, pulse 97, temperature 98 F (36.7 C), temperature source Oral, resp. rate 17, height 5\' 2"  (1.575 m), weight 44 kg, last menstrual period 12/02/2019, SpO2 100 %. PHYSICAL EXAMINATION:  GENERAL:  39 y.o.-year-old patient lying in the bed with no acute distress.  EYES: Pupils equal, round, reactive to light and accommodation. No scleral icterus. Extraocular muscles intact.  HEENT: Head atraumatic, normocephalic. Oropharynx and nasopharynx clear.  NECK:  Supple, no jugular venous distention. No thyroid enlargement, no  tenderness.  LUNGS: Normal breath sounds bilaterally, no wheezing, rales,rhonchi or crepitation. No use of accessory muscles of respiration.  CARDIOVASCULAR: S1, S2 normal. No murmurs, rubs, or gallops.  ABDOMEN: Soft, non-tender, non-distended. Bowel sounds present. No organomegaly or mass.  EXTREMITIES: No pedal edema, cyanosis, or clubbing.  NEUROLOGIC: Cranial nerves II through XII are intact. Muscle strength 5/5 in all extremities. Sensation intact. Gait not checked.  PSYCHIATRIC: The patient is alert and oriented x 3.  SKIN: No obvious rash, lesion, or ulcer.  DATA REVIEW:   CBC Recent Labs  Lab 12/22/19 1030  WBC 3.7*  HGB 11.4*  HCT 36.3  PLT 43*    Chemistries  Recent Labs  Lab 12/19/19 1743 12/20/19 1701 12/21/19 0632 12/21/19 0632 12/22/19 0533  NA   < >  --  142   < > 140  K   < >  --  4.9   < > 5.3*  CL   < >  --  104   < > 104  CO2   < >  --  19*   < > 24  GLUCOSE   < >  --  65*   < > 87  BUN   < >  --  64*   < > 70*  CREATININE   < >  --  19.59*   < > 20.64*  CALCIUM   < >  --  8.1*   < > 8.0*  MG  --  2.0  --   --   --   AST   < >  --  36  --   --   ALT   < >  --  21  --   --   ALKPHOS   < >  --  65  --   --   BILITOT   < >  --  1.0  --   --    < > = values in this interval not displayed.     Outpatient follow-up Follow-up Information    Bender, Durene Cal, MD. Schedule an appointment as soon as possible for a visit in 3 day(s).   Specialty: Family Medicine Contact information: [place] [id number] [phone]        Vladimir Crofts, MD. Schedule an appointment as soon as possible for a visit in 1 week(s).   Specialty: Neurology Contact information: [facility] [place] [address] [phone]            Management plans discussed with the patient, family and they are in agreement.  CODE STATUS: Full Code   TOTAL TIME TAKING CARE OF THIS PATIENT: 45  minutes.    Max Sane M.D on 12/22/2019 at 5:38 PM  [facility]   CC: Primary care physician; Letta Median, MD   Note: This  dictation was prepared with Dragon dictation along with smaller phrase technology. Any transcriptional errors that result from this process are unintentional.

## 2019-12-22 NOTE — Progress Notes (Signed)
Post HD Tx Assessment   12/22/19 1455  Neurological  Level of Consciousness Alert  Orientation Level Oriented to person;Disoriented to time;Oriented to situation;Oriented to place  Respiratory  Respiratory Pattern Regular;Unlabored  Chest Assessment Chest expansion symmetrical  Bilateral Breath Sounds Diminished;Clear  Cardiac  Pulse Regular  Heart Sounds S1, S2  Jugular Venous Distention (JVD) No  ECG Monitor Yes  Cardiac Rhythm NSR  Antiarrhythmic device No  Vascular  R Radial Pulse +2  L Radial Pulse +2  Integumentary  Integumentary (WDL) X  Skin Color Appropriate for ethnicity  Skin Condition Dry  Skin Integrity Abrasion;Other (Comment);Rash (generalized)  Rash Location Back;Arm;Face;Ankle;Leg  Rash Location Orientation Other (Comment) (Generalized)  Musculoskeletal  Musculoskeletal (WDL) X  Generalized Weakness Yes  Gastrointestinal  Bowel Sounds Assessment Hypoactive  Last BM Date  (Pt doesnt't remeber )  GU Assessment  Genitourinary Symptoms Oliguria (ESRD)  Psychosocial  Psychosocial (WDL) X  Patient Behaviors Anxious;Agitated;Irritable  Needs Expressed Emotional  Emotional support given Given to patient

## 2019-12-22 NOTE — Progress Notes (Signed)
Pre HD Tx Note Pt arrived from Patricia Maldonado to receive HD Tx. Pt is A*4 on RA SPO2 100%. BP is high not different from baseline. Pt's should receive 3 hrs Tx on 2K2.5Ca for K+5.3. CVC WDL new dressing and antimicrobial patch on. Will. Seizures and aspirations precautions maintained   12/22/19 1150  Hand-Off documentation  Report given to (Full Name) Newt Minion RN   Report received from (Full Name) Charlann Boxer RN  Vital Signs  Temp 98.6 F (37 C)  Temp Source Oral  Pulse Rate 71  Pulse Rate Source Monitor  Resp (!) 22  BP (!) 183/119  BP Location Left Arm  BP Method Automatic  Patient Position (if appropriate) Lying  Oxygen Therapy  SpO2 100 %  O2 Device Room Air  Pain Assessment  Pain Scale 0-10  Pain Score 0  Dialysis Weight  Weight 44 kg  Type of Weight Pre-Dialysis  Time-Out for Hemodialysis  What Procedure? HD  Pt Identifiers(min of two) First/Last Name;MRN/Account#  Correct Site? Yes  Correct Side? Yes  Correct Procedure? Yes  Consents Verified? Yes  Safety Precautions Reviewed? Yes  Engineer, civil (consulting) Number 8  Station Number 1  UF/Alarm Test Passed  Conductivity: Meter 13.8  Conductivity: Machine  13.9  pH 7.6  Reverse Osmosis Main  Normal Saline Lot Number [id number]  Dialyzer Lot Number [id number]  Disposable Set Lot Number [id number]  Machine Temperature 96.8 F (36 C)  Musician and Audible Yes  Blood Lines Intact and Secured Yes  Pre Treatment Patient Checks  Vascular access used during treatment Catheter  HD catheter dressing before treatment WDL  Patient is receiving dialysis in a chair  (In BEd)  Hepatitis B Surface Antigen Results  (Unknown)  Isolation Initiated Yes  Hepatitis B Surface Antibody  (Unknown)  Date Hepatitis B Surface Antibody Drawn  (NA)  Hemodialysis Consent Verified Yes  Hemodialysis Standing Orders Initiated Yes  ECG (Telemetry) Monitor On Yes  Prime Ordered Normal Saline  Length of  DialysisTreatment -hour(s) 3  Hour(s)  Dialysis Treatment Comments  (Na140)  Dialyzer Elisio 17H NR  Dialysate 2K;2.5 Ca  Dialysis Anticoagulant None  Dialysate Flow Ordered 600  Blood Flow Rate Ordered 300 mL/min  Ultrafiltration Goal 0.5 Liters  Dialysis Blood Pressure Support Ordered Albumin  Note  Observations pt transported to dialysis  Hemodialysis Catheter Right Subclavian  Placement Date: 12/20/19   Orientation: Right  Access Location: Subclavian  Site Condition No complications  Blue Lumen Status Infusing;Flushed;Blood return noted  Red Lumen Status Infusing;Flushed;Blood return noted  Purple Lumen Status N/A  Catheter fill solution Heparin 1000 units/ml  Catheter fill volume (Arterial) 1.5 cc  Catheter fill volume (Venous) 1.5  Dressing Type Biopatch  Dressing Status Clean;Dry  Interventions New dressing;Dressing changed  Drainage Description None  Dressing Change Due 12/26/19

## 2019-12-22 NOTE — Plan of Care (Signed)
  Problem: Coping: Goal: Ability to verbalize frustrations and anger appropriately will improve Outcome: Progressing Goal: Ability to demonstrate self-control will improve Outcome: Progressing   Problem: Education: Goal: Knowledge of [facility] Education information/materials will improve Outcome: Progressing Goal: Emotional status will improve Outcome: Progressing Goal: Mental status will improve Outcome: Progressing Goal: Verbalization of understanding the information provided will improve Outcome: Progressing  Pt HD is progressing UF is off. Only 537ml of fluids removal

## 2019-12-23 ENCOUNTER — Emergency Department: Payer: Medicaid Other

## 2019-12-23 ENCOUNTER — Inpatient Hospital Stay
Admission: EM | Admit: 2019-12-23 | Discharge: 2019-12-23 | DRG: 080 | Disposition: A | Discharge status: Other Acute Inpatient Hospital | Payer: Medicaid Other | Attending: Internal Medicine | Admitting: Internal Medicine

## 2019-12-23 ENCOUNTER — Ambulatory Visit (HOSPITAL_COMMUNITY)
Admission: AD | Admit: 2019-12-23 | Discharge: 2019-12-23 | Disposition: A | Discharge status: Home / Self Care | Payer: Medicaid Other | Source: Other Acute Inpatient Hospital | Attending: Emergency Medicine | Admitting: Emergency Medicine

## 2019-12-23 ENCOUNTER — Encounter: Payer: Self-pay | Admitting: Emergency Medicine

## 2019-12-23 ENCOUNTER — Other Ambulatory Visit: Payer: Self-pay

## 2019-12-23 DIAGNOSIS — Z880 Allergy status to penicillin: Secondary | ICD-10-CM | POA: Diagnosis not present

## 2019-12-23 DIAGNOSIS — Z20822 Contact with and (suspected) exposure to covid-19: Secondary | ICD-10-CM | POA: Diagnosis present

## 2019-12-23 DIAGNOSIS — M533 Sacrococcygeal disorders, not elsewhere classified: Secondary | ICD-10-CM | POA: Diagnosis present

## 2019-12-23 DIAGNOSIS — I169 Hypertensive crisis, unspecified: Secondary | ICD-10-CM | POA: Diagnosis present

## 2019-12-23 DIAGNOSIS — I120 Hypertensive chronic kidney disease with stage 5 chronic kidney disease or end stage renal disease: Secondary | ICD-10-CM | POA: Diagnosis present

## 2019-12-23 DIAGNOSIS — M329 Systemic lupus erythematosus, unspecified: Secondary | ICD-10-CM | POA: Diagnosis present

## 2019-12-23 DIAGNOSIS — Z79899 Other long term (current) drug therapy: Secondary | ICD-10-CM | POA: Diagnosis not present

## 2019-12-23 DIAGNOSIS — I6783 Posterior reversible encephalopathy syndrome: Secondary | ICD-10-CM | POA: Diagnosis present

## 2019-12-23 DIAGNOSIS — D696 Thrombocytopenia, unspecified: Secondary | ICD-10-CM | POA: Diagnosis present

## 2019-12-23 DIAGNOSIS — G40909 Epilepsy, unspecified, not intractable, without status epilepticus: Secondary | ICD-10-CM | POA: Diagnosis present

## 2019-12-23 DIAGNOSIS — F17210 Nicotine dependence, cigarettes, uncomplicated: Secondary | ICD-10-CM | POA: Diagnosis present

## 2019-12-23 DIAGNOSIS — Z992 Dependence on renal dialysis: Secondary | ICD-10-CM | POA: Diagnosis not present

## 2019-12-23 DIAGNOSIS — M1990 Unspecified osteoarthritis, unspecified site: Secondary | ICD-10-CM | POA: Diagnosis present

## 2019-12-23 DIAGNOSIS — N186 End stage renal disease: Secondary | ICD-10-CM | POA: Diagnosis present

## 2019-12-23 DIAGNOSIS — G936 Cerebral edema: Secondary | ICD-10-CM | POA: Diagnosis present

## 2019-12-23 DIAGNOSIS — R4182 Altered mental status, unspecified: Secondary | ICD-10-CM | POA: Diagnosis present

## 2019-12-23 LAB — RESPIRATORY PANEL BY RT PCR (FLU A&B, COVID)
Influenza A by PCR: NEGATIVE
Influenza B by PCR: NEGATIVE
SARS Coronavirus 2 by RT PCR: NEGATIVE

## 2019-12-23 LAB — CBC WITH DIFFERENTIAL/PLATELET
Abs Immature Granulocytes: 0.01 10*3/uL (ref 0.00–0.07)
Basophils Absolute: 0 10*3/uL (ref 0.0–0.1)
Basophils Relative: 1 %
Eosinophils Absolute: 0.1 10*3/uL (ref 0.0–0.5)
Eosinophils Relative: 2 %
HCT: 35.2 % — ABNORMAL LOW (ref 36.0–46.0)
Hemoglobin: 11.1 g/dL — ABNORMAL LOW (ref 12.0–15.0)
Immature Granulocytes: 0 %
Lymphocytes Relative: 16 %
Lymphs Abs: 0.5 10*3/uL — ABNORMAL LOW (ref 0.7–4.0)
MCH: 30.9 pg (ref 26.0–34.0)
MCHC: 31.5 g/dL (ref 30.0–36.0)
MCV: 98.1 fL (ref 80.0–100.0)
Monocytes Absolute: 0.4 10*3/uL (ref 0.1–1.0)
Monocytes Relative: 14 %
Neutro Abs: 1.9 10*3/uL (ref 1.7–7.7)
Neutrophils Relative %: 67 %
Platelets: 35 10*3/uL — ABNORMAL LOW (ref 150–400)
RBC: 3.59 MIL/uL — ABNORMAL LOW (ref 3.87–5.11)
RDW: 15.1 % (ref 11.5–15.5)
WBC: 2.9 10*3/uL — ABNORMAL LOW (ref 4.0–10.5)
nRBC: 0 % (ref 0.0–0.2)

## 2019-12-23 LAB — HCG, QUANTITATIVE, PREGNANCY: hCG, Beta Chain, Quant, S: 3 m[IU]/mL (ref <5)

## 2019-12-23 LAB — BASIC METABOLIC PANEL
Anion gap: 14 (ref 5–15)
BUN: 26 mg/dL — ABNORMAL HIGH (ref 6–20)
CO2: 27 mmol/L (ref 22–32)
Calcium: 8 mg/dL — ABNORMAL LOW (ref 8.9–10.3)
Chloride: 97 mmol/L — ABNORMAL LOW (ref 98–111)
Creatinine, Ser: 10.49 mg/dL — ABNORMAL HIGH (ref 0.44–1.00)
GFR calc Af Amer: 5 mL/min — ABNORMAL LOW (ref >60)
GFR calc non Af Amer: 4 mL/min — ABNORMAL LOW (ref >60)
Glucose, Bld: 81 mg/dL (ref 70–99)
Potassium: 3.9 mmol/L (ref 3.5–5.1)
Sodium: 138 mmol/L (ref 135–145)

## 2019-12-23 LAB — PATHOLOGIST SMEAR REVIEW

## 2019-12-23 MED ORDER — CLEVIDIPINE BUTYRATE 0.5 MG/ML IV EMUL
0.0000 mg/h | INTRAVENOUS | Status: DC
  Administered 2019-12-23: 1 mg/h via INTRAVENOUS
  Filled 2019-12-23: qty 50

## 2019-12-23 MED ORDER — ONDANSETRON 4 MG PO TBDP
4.0000 mg | ORAL_TABLET | Freq: Once | ORAL | Status: AC
  Administered 2019-12-23: 4 mg via ORAL
  Filled 2019-12-23: qty 1

## 2019-12-23 MED ORDER — OXYCODONE HCL 5 MG PO TABS
5.0000 mg | ORAL_TABLET | Freq: Once | ORAL | Status: AC
  Administered 2019-12-23: 5 mg via ORAL
  Filled 2019-12-23: qty 1

## 2019-12-23 MED ORDER — ACETAMINOPHEN 500 MG PO TABS
1000.0000 mg | ORAL_TABLET | Freq: Once | ORAL | Status: AC
  Administered 2019-12-23: 1000 mg via ORAL
  Filled 2019-12-23: qty 2

## 2019-12-23 NOTE — ED Notes (Signed)
Spoke with Erlene Quan with [facility] to help set up transportation.

## 2019-12-23 NOTE — ED Provider Notes (Addendum)
[facility] Emergency Department Provider Note  ____________________________________________   First MD Initiated Contact with Patient 12/23/19 1335     (approximate)  I have reviewed the triage vital signs and the nursing notes.   HISTORY  Chief Complaint Headache    HPI Patricia Maldonado is a 39 y.o. female with ESRD, lupus on CellCept who comes in for headaches.  Patient was at dialysis today when she was only 7 minutes in and she started having a headache and had to be taken off dialysis.  Patient was last dialyzed yesterday.  Patient is been seen for headache on 3/12 and had a negative MRI without contrast.  Patient was also admitted on 3/15-3/18 secondary to seizures and had a normal EEG but started on Keppra.  Patient denies any vision changes.  The headaches are in the front and back of her head, constant, onset this morning, did not take anything to help the pain, worse with light.  They reported that her blood pressures have been running higher than normal and at dialysis they are in the 200s although patient is in the 150s here.           Past Medical History:  Diagnosis Date  . Acute renal failure (ARF) ([place])   . Anemia   . Arthritis   . History of kidney stones   . Hypertension    OFF BP MEDS X 3 WEEKS DUE TO BP CONTROL  . 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 Rash   . Staghorn calculus   . 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[place]) 10/2016   [place] ADMISSION    Patient Active Problem List   Diagnosis Date Noted  . Seizure disorder ([place]) 12/20/2019  . Acute repetitive seizure ([place]) 12/19/2019  . History of Closed sacral fracture ([place]) 12/19/2019  . Thrombocytopenia ([place]) 12/19/2019  . Anemia, unspecified 04/28/2019  . ESRD on hemodialysis ([place]) 02/21/2019  . Essential hypertension 02/21/2019  . CKD (chronic kidney disease), stage V ([place]) 09/12/2018  . Metabolic acidosis 93/73/4287  . Secondary hyperparathyroidism  ([place]) 09/12/2018  . Acute on chronic renal failure ([place]) 04/26/2018  . Acute kidney injury ([place]) 07/27/2017  . Alopecia of scalp 03/08/2017  . Thyroid nodule 03/08/2017  . PE (pulmonary thromboembolism) ([place]) 03/01/2017  . Kidney stone on left side 01/23/2017  . Systemic lupus erythematosus ([place]) 12/08/2016  . Encounter for long-term (current) use of high-risk medication 12/08/2016  . 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 Acute renal failure (ARF) ([place]) 10/04/2016  . Nephrolithiasis   . Staghorn calculus   . Acute renal failure with tubular necrosis ([place]) 08/30/2016    Past Surgical History:  Procedure Laterality Date  . CYSTOSCOPY/URETEROSCOPY/HOLMIUM LASER/STENT PLACEMENT Right 02/21/2017   Procedure: CYSTOSCOPY/URETEROSCOPY/HOLMIUM LASER/STENT EXCHANGE;  Surgeon: [name], MD;  Location: [facility] ORS;  Service: Urology;  Laterality: Right;  . DIALYSIS/PERMA CATHETER INSERTION N/A 11/30/2018   Procedure: DIALYSIS/PERMA CATHETER INSERTION;  Surgeon: Algernon Huxley, MD;  Location: [place] CV LAB;  Service: Cardiovascular;  Laterality: N/A;  . IR NEPHROSTOMY PLACEMENT RIGHT  01/23/2017  . NEPHROLITHOTOMY Right 01/24/2017   Procedure: NEPHROLITHOTOMY PERCUTANEOUS;  Surgeon: [name], MD;  Location: [facility] ORS;  Service: Urology;  Laterality: Right;  . PERITONEAL CATHETER INSERTION    . TUBAL LIGATION      Prior to Admission medications   Medication Sig Start Date End Date Taking? Authorizing Provider  acetaminophen (TYLENOL) 500 MG tablet Take 500  mg by mouth every 6 (six) hours as needed for headache.    [provider]  B Complex-C-Folic Acid (RENA-VITE RX) 1 MG TABS Take 1 tablet by mouth Every Tuesday,Thursday,and Saturday with dialysis.     [provider]  cinacalcet (SENSIPAR) 30 MG tablet Take 30 mg by mouth daily.    [provider]  gentamicin cream (GARAMYCIN) 0.1 % Apply 1 application topically daily. (apply to  access site) 11/01/19   [provider]  levETIRAcetam (KEPPRA) 500 MG tablet Take 1 tablet (500 mg total) by mouth daily. 12/22/19   Max Sane, MD  losartan (COZAAR) 50 MG tablet Take 1 tablet (50 mg total) by mouth every evening. 12/22/19   Max Sane, MD  mycophenolate (CELLCEPT) 500 MG tablet Take 2 tablets (1,000 mg total) by mouth 2 (two) times daily. 04/30/18   Hillary Bow, MD  RENAGEL 800 MG tablet Take 800 mg by mouth in the morning, at noon, in the evening, and at bedtime. (at meals at with a snack)    [provider]    Allergies Penicillins  Family History  Problem Relation Age of Onset  . Thyroid disease Mother   . Thyroid disease Father   . Diabetes Father     Social History Social History   Tobacco Use  . Smoking status: Current Every Day Smoker    Packs/day: 0.25    Years: 15.00    Pack years: 3.75    Types: Cigarettes  . Smokeless tobacco: Never Used  Substance Use Topics  . Alcohol use: No  . Drug use: Yes    Types: Marijuana    Comment: OCC      Review of Systems Constitutional: No fever/chills Eyes: No visual changes. ENT: No sore throat. Cardiovascular: Denies chest pain. Respiratory: Denies shortness of breath. Gastrointestinal: No abdominal pain.  No nausea, no vomiting.  No diarrhea.  No constipation. Genitourinary: Negative for dysuria. Musculoskeletal: Negative for back pain. Skin: Negative for rash. Neurological: Positive headache, no focal weakness or numbness. All other ROS negative ____________________________________________   PHYSICAL EXAM:  VITAL SIGNS: ED Triage Vitals  Enc Vitals Group     BP 12/23/19 1258 (!) 156/112     Pulse Rate 12/23/19 1258 98     Resp 12/23/19 1258 16     Temp 12/23/19 1258 98.8 F (37.1 C)     Temp Source 12/23/19 1258 Oral     SpO2 --      Weight 12/23/19 1259 98 lb (44.5 kg)     Height 12/23/19 1259 5\' 2"  (1.575 m)     Head Circumference --      Peak Flow --      Pain  Score 12/23/19 1259 9     Pain Loc --      Pain Edu? --      Excl. in [place]? --     Constitutional: Alert and oriented. Well appearing and in no acute distress. Eyes: Conjunctivae are normal. EOMI. Head: Atraumatic. Nose: No congestion/rhinnorhea. Mouth/Throat: Mucous membranes are moist.   Neck: No stridor. Trachea Midline. FROM Cardiovascular: Normal rate, regular rhythm. Grossly normal heart sounds.  Good peripheral circulation.  Chest wall dialysis catheter. Respiratory: Normal respiratory effort.  No retractions. Lungs CTAB. Gastrointestinal: Soft and nontender. No distention. No abdominal bruits.  Musculoskeletal: No lower extremity tenderness nor edema.  No joint effusions. Neurologic:  Normal speech and language.  Cranial nerves II through XII appear intact.  Equal strength in arms and legs.  Sensation intact.  Patient is slightly ataxic with walking Skin:  Skin is warm, dry and intact. No rash noted. Psychiatric: Mood and affect are normal. Speech and behavior are normal. GU: Deferred   ____________________________________________   LABS (all labs ordered are listed, but only abnormal results are displayed)  Labs Reviewed  CBC WITH DIFFERENTIAL/PLATELET - Abnormal; Notable for the following components:      Result Value   WBC 2.9 (*)    RBC 3.59 (*)    Hemoglobin 11.1 (*)    HCT 35.2 (*)    Platelets 35 (*)    Lymphs Abs 0.5 (*)    All other components within normal limits  BASIC METABOLIC PANEL - Abnormal; Notable for the following components:   Chloride 97 (*)    BUN 26 (*)    Creatinine, Ser 10.49 (*)    Calcium 8.0 (*)    GFR calc non Af Amer 4 (*)    GFR calc Af Amer 5 (*)    All other components within normal limits  RESPIRATORY PANEL BY RT PCR (FLU A&B, COVID)  HCG, QUANTITATIVE, PREGNANCY  PATHOLOGIST SMEAR REVIEW   ____________________________________________  RADIOLOGY   Official radiology report(s): CT Head Wo Contrast  Result Date:  12/23/2019 CLINICAL DATA:  Headache during dialysis EXAM: CT HEAD WITHOUT CONTRAST TECHNIQUE: Contiguous axial images were obtained from the base of the skull through the vertex without intravenous contrast. COMPARISON:  12/19/2019 FINDINGS: Brain: There is no acute intracranial hemorrhage, mass effect, or edema. Gray-white differentiation is preserved. There is increased effacement of cortical sulci and decreased caliber of the ventricles. There is no extra-axial fluid collection. Vascular: No hyperdense vessel or unexpected calcification. Skull: Calvarium is unremarkable. Sinuses/Orbits: No acute finding. Other: None. IMPRESSION: Increased effacement cortical sulci with decreased caliber of the ventricles suggesting presence of cerebral edema. No acute intracranial hemorrhage or loss of gray-white differentiation Electronically Signed   By: Macy Mis M.D.   On: 12/23/2019 14:27   MR BRAIN WO CONTRAST  Result Date: 12/23/2019 CLINICAL DATA:  Headache with normal neuro exam. Dialysis patient with severe hypertension, headache EXAM: MRI HEAD WITHOUT CONTRAST TECHNIQUE: Multiplanar, multiecho pulse sequences of the brain and surrounding structures were obtained without intravenous contrast. COMPARISON:  MRI head 12/13/2019 FINDINGS: Brain: Symmetric and patchy T2 and FLAIR hyperintensity in the cortex primarily in the occipital parietal lobes but extending anteriorly into the frontal lobes over the convexity. Small patchy areas of edema also in the cerebellum bilaterally. No associated hemorrhage or acute infarct. This corresponds to the hypodense areas on CT today. This was not present on the MRI of 12/13/2019 is consistent with PRES. Ventricle size is normal.  No acute infarct hemorrhage or mass Vascular: Normal arterial flow voids Skull and upper cervical spine: No focal skeletal lesion. Sinuses/Orbits: Mild mucosal edema paranasal sinuses. Negative orbit Other: None IMPRESSION: Symmetric cortical edema in  the occipital, parietal, frontal lobes and in the cerebellum bilaterally. No acute infarct or hemorrhage. Findings compatible with PRES given history of dialysis and severe hypertension. Electronically Signed   By: Franchot Gallo M.D.   On: 12/23/2019 16:22    ____________________________________________   PROCEDURES  Procedure(s) performed (including Critical Care):  .Critical Care Performed by: Vanessa Victor, MD Authorized by: Vanessa Elkridge, MD   Critical care provider statement:    Critical care time (minutes):  45   Critical care was necessary to treat or prevent imminent or life-threatening deterioration of the following conditions:  CNS failure or  compromise   Critical care was time spent personally by me on the following activities:  Discussions with consultants, evaluation of patient's response to treatment, examination of patient, ordering and performing treatments and interventions, ordering and review of laboratory studies, ordering and review of radiographic studies, pulse oximetry, re-evaluation of patient's condition, obtaining history from patient or surrogate and review of old charts     ____________________________________________   INITIAL IMPRESSION / [place] / ED COURSE  Patricia Maldonado was evaluated in Emergency Department on 12/23/2019 for the symptoms described in the history of present illness. She was evaluated in the context of the global COVID-19 pandemic, which necessitated consideration that the patient might be at risk for infection with the SARS-CoV-2 virus that causes COVID-19. Institutional protocols and algorithms that pertain to the evaluation of patients at risk for COVID-19 are in a state of rapid change based on information released by regulatory bodies including the CDC and federal and state organizations. These policies and algorithms were followed during the patient's care in the ED.    Patient is a 39 year old with ESRD who comes in  with recurrent headaches.  Patient already had MRI and EEG that have been reassuring.  She denies any symptoms suggest meningitis or pseudotumor cerebri to warrant LP.  She also denies any neck trauma to suggest a dissection and has a normal neuro exam.  Patient was already discharged yesterday from the hospital for concerns for seizures with a plan to follow-up with neurology.  I discussed with patient that this would be the best to help her be started on some medicines to help prevent headaches given her recurrent headaches.  That she needs to follow-up with her nephrologist for her elevated blood pressures.  Given her history of low platelets will get repeat CT head just to make sure no evidence of intracranial hemorrhage although I have low suspicion and she was within 12 hours and I do not think LP would be necessary.  Will also get labs given patient missed dialysis to see if she needs emergent dialysis today.  Labs are reassuring does not need emergent dialysis.  CT imaging was concerning for edema.  I discussed with Dr. Doy Mince who recommends MRI and holding off on blood pressure modification until we get MRI to see what is going on.  MRI is consistent with edema concerning for press.  Discussed again with Dr. Doy Mince who recommends admission to ICU.  Recommend keeping blood pressure less than 160/100 and recommended using Cleviprex.  Discussed with Dr. Mortimer Fries who accept patient to the ICU   Charge nurse notified me that there was no ICU beds that would be available.  Discussed with patient that we usually transfer to [facility] but patient is requesting transfer to [facility].  Discussed with [facility] and patient accepted by Dr. Posey Pronto   ____________________________________________   FINAL CLINICAL IMPRESSION(S) / ED DIAGNOSES   Final diagnoses:  PRES (posterior reversible encephalopathy syndrome)  ESRD (end stage renal disease) on dialysis [facility])      MEDICATIONS GIVEN DURING THIS  VISIT:  Medications  clevidipine (CLEVIPREX) infusion 0.5 mg/mL (1 mg/hr Intravenous New Bag/Given 12/23/19 1710)  acetaminophen (TYLENOL) tablet 1,000 mg (1,000 mg Oral Given 12/23/19 1403)  oxyCODONE (Oxy IR/ROXICODONE) immediate release tablet 5 mg (5 mg Oral Given 12/23/19 1405)  ondansetron (ZOFRAN-ODT) disintegrating tablet 4 mg (4 mg Oral Given 12/23/19 1405)     ED Discharge Orders    None       Note:  This document was prepared using Dragon voice recognition software and may include unintentional dictation errors.   Vanessa [place], MD 12/23/19 1746    Vanessa , MD 12/23/19 [phone]

## 2019-12-23 NOTE — ED Notes (Signed)
[facility]  CALLED  PER  DR  Jari Pigg  MD

## 2019-12-23 NOTE — ED Notes (Signed)
CT SCAN  AND  MRI  [place]

## 2019-12-23 NOTE — ED Notes (Signed)
See triage note  Presents with frontal h/a   States she woke up with h/a this  Positive nausea  denies any trauma or fever   Was sent here from dialysis

## 2019-12-23 NOTE — H&P (Signed)
Name: Patricia Maldonado MRN: [id number] DOB: October 06, 1980     CONSULTATION DATE: 12/23/2019  REFERRING MD :  Jari Pigg  CHIEF COMPLAINT:  Mental status changes with abnormal CT  Head/MRI    HISTORY OF PRESENT ILLNESS:   39 yo AAF WITH ESRD on HD  history of HTN and lupus   Discharged yesterday after being admitted for Seizure like activity Pt here via EMS from dialysis with c/o headache at her forehead that began this am. Was in the process of receiving dialysis, only completed 7 minutes before EMS called for pain and htn. Initially, bp was 200's over 100's at dialysis, 156/112 in triage. Hasn't taken anything for head pain today, and requesting pain meds in triage  MRI FINDINGS   Symmetric cortical edema in the occipital, parietal, frontal lobes and in the cerebellum bilaterally. No acute infarct or hemorrhage. Findings compatible with PRES given history of dialysis and severe Hypertension.   ER COURSE PATIENT STARTED ON ANTI-HTN MEDS INFUSION BP GOAL 160/100 as per NEUROLOGY   PREVIOUS ADMISSION 3/21 DISCHARGED  Acute repetitive seizure [facility]) -Patient had first seizure on 12/08/2019.  Had 2 seizures on the day of arrival -Unclear if seizures provoked.  - MRI brain on 3/12 shows no acute changes.  - EEG shows no epileptiform activity.  No further seizures while in the hospital - continue maintenance Keppra at 500mg  daily at discharge.  Would administer in the evenings so that it can be repleted after her dialysis days.   - Patient unable to drive, operate heavy machinery, perform activities at heights and participate in water activities until release by outpatient physician. - Follow up with [facility] eurology on an outpatient basis    Systemic lupus erythematosus ([place]) -Patient with thrombocytopenia for the past year, possibly related to her lupus -No evidence of active bleeding and hemodynamically stable    Essential hypertension/sinus tachycardia -Better control.  Continue losartan  as an outpatient  Sacral pain with history of Closed sacral fracture 12/08/2019 [facility]) -Recent history of sacral fracture related to fall onto her buttock when she had first seizure on 12/08/2019 -Try over-the-counter NSAIDs as needed  Estimated body mass index is 17.92 kg/m as calculated from the following:   Height as of this encounter: 5\' 2"  (1.575 m).   Weight as of this encounter: 44.5 kg.    VITAL SIGNS: Temp:  [98.8 F (37.1 C)-99.6 F (37.6 C)] 99.6 F (37.6 C) (03/22 1702) Pulse Rate:  [82-98] 94 (03/22 1730) Resp:  [11-19] 11 (03/22 1730) BP: (151-182)/(111-126) 151/111 (03/22 1730) SpO2:  [96 %-97 %] 96 % (03/22 1730) Weight:  [44.5 kg] 44.5 kg (03/22 1259)   SpO2: 96 %   MEDICATIONS: I have reviewed all medications and confirmed regimen as documented   CULTURE RESULTS   No results found for this or any previous visit (from the past 240 hour(s)).        IMAGING    CT Head Wo Contrast  Result Date: 12/23/2019 CLINICAL DATA:  Headache during dialysis EXAM: CT HEAD WITHOUT CONTRAST TECHNIQUE: Contiguous axial images were obtained from the base of the skull through the vertex without intravenous contrast. COMPARISON:  12/19/2019 FINDINGS: Brain: There is no acute intracranial hemorrhage, mass effect, or edema. Gray-white differentiation is preserved. There is increased effacement of cortical sulci and decreased caliber of the ventricles. There is no extra-axial fluid collection. Vascular: No hyperdense vessel or unexpected calcification. Skull: Calvarium is unremarkable. Sinuses/Orbits: No acute finding. Other: None. IMPRESSION: Increased effacement cortical sulci  with decreased caliber of the ventricles suggesting presence of cerebral edema. No acute intracranial hemorrhage or loss of gray-white differentiation Electronically Signed   By: Macy Mis M.D.   On: 12/23/2019 14:27   MR BRAIN WO CONTRAST  Result Date: 12/23/2019 CLINICAL DATA:  Headache with normal  neuro exam. Dialysis patient with severe hypertension, headache EXAM: MRI HEAD WITHOUT CONTRAST TECHNIQUE: Multiplanar, multiecho pulse sequences of the brain and surrounding structures were obtained without intravenous contrast. COMPARISON:  MRI head 12/13/2019 FINDINGS: Brain: Symmetric and patchy T2 and FLAIR hyperintensity in the cortex primarily in the occipital parietal lobes but extending anteriorly into the frontal lobes over the convexity. Small patchy areas of edema also in the cerebellum bilaterally. No associated hemorrhage or acute infarct. This corresponds to the hypodense areas on CT today. This was not present on the MRI of 12/13/2019 is consistent with PRES. Ventricle size is normal.  No acute infarct hemorrhage or mass Vascular: Normal arterial flow voids Skull and upper cervical spine: No focal skeletal lesion. Sinuses/Orbits: Mild mucosal edema paranasal sinuses. Negative orbit Other: None IMPRESSION: Symmetric cortical edema in the occipital, parietal, frontal lobes and in the cerebellum bilaterally. No acute infarct or hemorrhage. Findings compatible with PRES given history of dialysis and severe hypertension. Electronically Signed   By: Franchot Gallo M.D.   On: 12/23/2019 16:22       ASSESSMENT AND PLAN SYNOPSIS  ACUTE CEREBRAL EDEMA DUE TO ACUTE HTN CRISIS WITH END ORGAN DAMAGE BP GOAL 160/100    ESRD ON HD   -follow chem 7 HD AS NEEDED  NEUROLOGY ACUTE PRES BP GOALS PER NEUROLOGY   CARDIAC ICU monitoring  GI GI PROPHYLAXIS as indicated  NUTRITIONAL STATUS DIET--> as tolerated Constipation protocol as indicated   ENDO - will use ICU hypoglycemic\Hyperglycemia protocol if needed    ELECTROLYTES -follow labs as needed -replace as needed -pharmacy consultation and following   DVT/GI PRX ordered TRANSFUSIONS AS NEEDED MONITOR FSBS ASSESS the need for LABS   Patient transferred to [facility] for further assessment evaluation

## 2019-12-23 NOTE — ED Triage Notes (Signed)
Pt here via EMS from dialysis with c/o headache at her forehead that began this am. Was in the process of receiving dialysis, only completed 7 minutes before EMS called for pain and htn. Initially, bp was 200's over 100's at dialysis, 156/112 in triage. Hasn't taken anything for head pain today, and requesting pain meds in triage. Nad.

## 2019-12-25 ENCOUNTER — Ambulatory Visit (HOSPITAL_COMMUNITY)
Admission: AD | Admit: 2019-12-25 | Discharge: 2019-12-25 | Disposition: A | Discharge status: Home / Self Care | Payer: Medicaid Other | Source: Other Acute Inpatient Hospital | Attending: *Deleted | Admitting: *Deleted

## 2019-12-26 MED ORDER — LABETALOL HCL 5 MG/ML IV SOLN
40.00 | INTRAVENOUS | Status: DC
Start: ? — End: 2019-12-26

## 2019-12-26 MED ORDER — ACETAMINOPHEN 325 MG PO TABS
650.00 | ORAL_TABLET | ORAL | Status: DC
Start: ? — End: 2019-12-26

## 2019-12-26 MED ORDER — MELATONIN 3 MG PO TABS
3.00 | ORAL_TABLET | ORAL | Status: DC
Start: ? — End: 2019-12-26

## 2019-12-26 MED ORDER — LEVETIRACETAM 500 MG PO TABS
500.00 | ORAL_TABLET | ORAL | Status: DC
Start: 2019-12-30 — End: 2019-12-26

## 2019-12-26 MED ORDER — SUPER QUINTS B-50 PO TABS
1.00 | ORAL_TABLET | ORAL | Status: DC
Start: 2019-12-31 — End: 2019-12-26

## 2019-12-26 MED ORDER — GENERIC EXTERNAL MEDICATION
2.00 | Status: DC
Start: ? — End: 2019-12-26

## 2019-12-26 MED ORDER — SEVELAMER CARBONATE 800 MG PO TABS
800.00 | ORAL_TABLET | ORAL | Status: DC
Start: 2019-12-26 — End: 2019-12-26

## 2019-12-26 MED ORDER — CARVEDILOL 25 MG PO TABS
25.00 | ORAL_TABLET | ORAL | Status: DC
Start: 2019-12-26 — End: 2019-12-26

## 2019-12-26 MED ORDER — ONDANSETRON 4 MG PO TBDP
4.00 | ORAL_TABLET | ORAL | Status: DC
Start: ? — End: 2019-12-26

## 2019-12-26 MED ORDER — MAGNESIUM OXIDE 400 MG PO TABS
400.00 | ORAL_TABLET | ORAL | Status: DC
Start: 2019-12-30 — End: 2019-12-26

## 2019-12-26 MED ORDER — LOSARTAN POTASSIUM 25 MG PO TABS
50.00 | ORAL_TABLET | ORAL | Status: DC
Start: 2019-12-26 — End: 2019-12-26

## 2019-12-26 MED ORDER — NIFEDIPINE ER OSMOTIC RELEASE 30 MG PO TB24
60.00 | ORAL_TABLET | ORAL | Status: DC
Start: 2019-12-27 — End: 2019-12-26

## 2019-12-26 MED ORDER — ALBUMIN HUMAN 25 % IV SOLN
25.00 | INTRAVENOUS | Status: DC
Start: ? — End: 2019-12-26

## 2019-12-26 MED ORDER — POLYETHYLENE GLYCOL 3350 17 GM/SCOOP PO POWD
17.00 | ORAL | Status: DC
Start: ? — End: 2019-12-26

## 2019-12-26 MED ORDER — GENERIC EXTERNAL MEDICATION
1.50 | Status: DC
Start: ? — End: 2019-12-26

## 2019-12-26 MED ORDER — INFLUENZA VAC SPLIT QUAD 0.5 ML IM SUSY
0.50 | PREFILLED_SYRINGE | INTRAMUSCULAR | Status: DC
Start: ? — End: 2019-12-26

## 2019-12-26 MED ORDER — POLYETHYLENE GLYCOL 3350 17 GM/SCOOP PO POWD
17.00 | ORAL | Status: DC
Start: 2019-12-27 — End: 2019-12-26

## 2019-12-26 MED ORDER — HYDROXYCHLOROQUINE SULFATE 200 MG PO TABS
200.00 | ORAL_TABLET | ORAL | Status: DC
Start: 2019-12-31 — End: 2019-12-26

## 2019-12-30 MED ORDER — LEVETIRACETAM 500 MG PO TABS
500.00 | ORAL_TABLET | ORAL | Status: DC
Start: 2019-12-31 — End: 2019-12-30

## 2019-12-30 MED ORDER — NIFEDIPINE ER OSMOTIC RELEASE 30 MG PO TB24
90.00 | ORAL_TABLET | ORAL | Status: DC
Start: 2019-12-31 — End: 2019-12-30

## 2019-12-30 MED ORDER — MYCOPHENOLATE MOFETIL 500 MG PO TABS
500.00 | ORAL_TABLET | ORAL | Status: DC
Start: 2019-12-30 — End: 2019-12-30

## 2019-12-30 MED ORDER — VALSARTAN 160 MG PO TABS
160.00 | ORAL_TABLET | ORAL | Status: DC
Start: 2019-12-31 — End: 2019-12-30

## 2019-12-30 MED ORDER — TRIAMCINOLONE ACETONIDE 0.1 % EX OINT
TOPICAL_OINTMENT | CUTANEOUS | Status: DC
Start: 2019-12-30 — End: 2019-12-30

## 2019-12-30 MED ORDER — CAPSAICIN 0.025 % EX CREA
TOPICAL_CREAM | CUTANEOUS | Status: DC
Start: ? — End: 2019-12-30

## 2019-12-30 MED ORDER — GENERIC EXTERNAL MEDICATION
37.50 | Status: DC
Start: 2019-12-30 — End: 2019-12-30

## 2019-12-30 MED ORDER — FLUOCINOLONE ACETONIDE BODY 0.01 % EX OIL
TOPICAL_OIL | CUTANEOUS | Status: DC
Start: 2019-12-30 — End: 2019-12-30

## 2019-12-30 MED ORDER — ACETAMINOPHEN 325 MG PO TABS
650.00 | ORAL_TABLET | ORAL | Status: DC
Start: ? — End: 2019-12-30

## 2019-12-30 MED ORDER — SEVELAMER CARBONATE 800 MG PO TABS
1600.00 | ORAL_TABLET | ORAL | Status: DC
Start: 2019-12-30 — End: 2019-12-30

## 2019-12-30 MED ORDER — DICLOFENAC SODIUM 1 % EX GEL
2.00 | CUTANEOUS | Status: DC
Start: ? — End: 2019-12-30

## 2020-01-21 ENCOUNTER — Ambulatory Visit (INDEPENDENT_AMBULATORY_CARE_PROVIDER_SITE_OTHER): Payer: Medicaid Other | Admitting: Vascular Surgery

## 2020-01-24 ENCOUNTER — Ambulatory Visit (INDEPENDENT_AMBULATORY_CARE_PROVIDER_SITE_OTHER): Payer: Medicaid Other | Admitting: Vascular Surgery

## 2020-04-21 ENCOUNTER — Ambulatory Visit (INDEPENDENT_AMBULATORY_CARE_PROVIDER_SITE_OTHER): Payer: Medicaid Other | Admitting: Vascular Surgery

## 2020-04-24 ENCOUNTER — Other Ambulatory Visit: Payer: Self-pay

## 2020-04-24 ENCOUNTER — Emergency Department
Admission: EM | Admit: 2020-04-24 | Discharge: 2020-04-25 | Disposition: A | Discharge status: Home / Self Care | Payer: Medicaid Other | Attending: Emergency Medicine | Admitting: Emergency Medicine

## 2020-04-24 DIAGNOSIS — R112 Nausea with vomiting, unspecified: Secondary | ICD-10-CM | POA: Diagnosis not present

## 2020-04-24 DIAGNOSIS — Z5321 Procedure and treatment not carried out due to patient leaving prior to being seen by health care provider: Secondary | ICD-10-CM | POA: Insufficient documentation

## 2020-04-24 DIAGNOSIS — I10 Essential (primary) hypertension: Secondary | ICD-10-CM | POA: Diagnosis not present

## 2020-04-24 LAB — CBC
HCT: 36.7 % (ref 36.0–46.0)
Hemoglobin: 11.4 g/dL — ABNORMAL LOW (ref 12.0–15.0)
MCH: 30.6 pg (ref 26.0–34.0)
MCHC: 31.1 g/dL (ref 30.0–36.0)
MCV: 98.7 fL (ref 80.0–100.0)
Platelets: 77 10*3/uL — ABNORMAL LOW (ref 150–400)
RBC: 3.72 MIL/uL — ABNORMAL LOW (ref 3.87–5.11)
RDW: 16.5 % — ABNORMAL HIGH (ref 11.5–15.5)
WBC: 4 10*3/uL (ref 4.0–10.5)
nRBC: 0 % (ref 0.0–0.2)

## 2020-04-24 LAB — URINALYSIS, COMPLETE (UACMP) WITH MICROSCOPIC
Bilirubin Urine: NEGATIVE
Glucose, UA: NEGATIVE mg/dL
Ketones, ur: NEGATIVE mg/dL
Nitrite: NEGATIVE
Protein, ur: >=300 mg/dL — AB
Specific Gravity, Urine: 1.011 (ref 1.005–1.030)
WBC, UA: >50 WBC/hpf — ABNORMAL HIGH (ref 0–5)
pH: 9 — ABNORMAL HIGH (ref 5.0–8.0)

## 2020-04-24 LAB — COMPREHENSIVE METABOLIC PANEL
ALT: 12 U/L (ref 0–44)
AST: 20 U/L (ref 15–41)
Albumin: 4.5 g/dL (ref 3.5–5.0)
Alkaline Phosphatase: 78 U/L (ref 38–126)
Anion gap: 14 (ref 5–15)
BUN: 19 mg/dL (ref 6–20)
CO2: 27 mmol/L (ref 22–32)
Calcium: 9.5 mg/dL (ref 8.9–10.3)
Chloride: 95 mmol/L — ABNORMAL LOW (ref 98–111)
Creatinine, Ser: 7.02 mg/dL — ABNORMAL HIGH (ref 0.44–1.00)
GFR calc Af Amer: 8 mL/min — ABNORMAL LOW (ref >60)
GFR calc non Af Amer: 7 mL/min — ABNORMAL LOW (ref >60)
Glucose, Bld: 90 mg/dL (ref 70–99)
Potassium: 3.4 mmol/L — ABNORMAL LOW (ref 3.5–5.1)
Sodium: 136 mmol/L (ref 135–145)
Total Bilirubin: 0.8 mg/dL (ref 0.3–1.2)
Total Protein: 8.9 g/dL — ABNORMAL HIGH (ref 6.5–8.1)

## 2020-04-24 LAB — LIPASE, BLOOD: Lipase: 33 U/L (ref 11–51)

## 2020-04-24 MED ORDER — SODIUM CHLORIDE 0.9% FLUSH: 3.0000 mL | Freq: Once | INTRAVENOUS | Status: DC

## 2020-04-24 MED ORDER — LOSARTAN POTASSIUM 50 MG PO TABS
50.0000 mg | ORAL_TABLET | Freq: Once | ORAL | Status: AC
  Administered 2020-04-24: 50 mg via ORAL

## 2020-04-24 MED ORDER — ONDANSETRON 4 MG PO TBDP
4.0000 mg | ORAL_TABLET | Freq: Once | ORAL | Status: AC | PRN
  Administered 2020-04-24: 4 mg via ORAL
  Filled 2020-04-24: qty 1

## 2020-04-24 NOTE — ED Triage Notes (Signed)
Pt to the er from dialysis for nausea, vomiting and hypertension. Pt states this is the second time this week has happened where during dialysis she becomes nauseated and vomits and her BP elevates. Dialysis was cut short on Monday and today.

## 2020-04-27 LAB — POCT PREGNANCY, URINE: Preg Test, Ur: NEGATIVE

## 2020-05-06 ENCOUNTER — Emergency Department: Payer: Medicare Other

## 2020-05-06 ENCOUNTER — Inpatient Hospital Stay
Admission: EM | Admit: 2020-05-06 | Discharge: 2020-05-10 | DRG: 070 | Disposition: A | Discharge status: Home / Self Care | Payer: Medicare Other | Attending: Internal Medicine | Admitting: Internal Medicine

## 2020-05-06 DIAGNOSIS — D631 Anemia in chronic kidney disease: Secondary | ICD-10-CM | POA: Diagnosis present

## 2020-05-06 DIAGNOSIS — Z992 Dependence on renal dialysis: Secondary | ICD-10-CM

## 2020-05-06 DIAGNOSIS — Z8349 Family history of other endocrine, nutritional and metabolic diseases: Secondary | ICD-10-CM | POA: Diagnosis not present

## 2020-05-06 DIAGNOSIS — Z880 Allergy status to penicillin: Secondary | ICD-10-CM

## 2020-05-06 DIAGNOSIS — H43393 Other vitreous opacities, bilateral: Secondary | ICD-10-CM | POA: Diagnosis present

## 2020-05-06 DIAGNOSIS — Z20822 Contact with and (suspected) exposure to covid-19: Secondary | ICD-10-CM | POA: Diagnosis present

## 2020-05-06 DIAGNOSIS — N186 End stage renal disease: Secondary | ICD-10-CM

## 2020-05-06 DIAGNOSIS — K529 Noninfective gastroenteritis and colitis, unspecified: Secondary | ICD-10-CM | POA: Diagnosis present

## 2020-05-06 DIAGNOSIS — H538 Other visual disturbances: Secondary | ICD-10-CM

## 2020-05-06 DIAGNOSIS — N390 Urinary tract infection, site not specified: Secondary | ICD-10-CM | POA: Diagnosis present

## 2020-05-06 DIAGNOSIS — I2699 Other pulmonary embolism without acute cor pulmonale: Secondary | ICD-10-CM | POA: Diagnosis present

## 2020-05-06 DIAGNOSIS — Z86711 Personal history of pulmonary embolism: Secondary | ICD-10-CM | POA: Diagnosis not present

## 2020-05-06 DIAGNOSIS — Z87442 Personal history of urinary calculi: Secondary | ICD-10-CM | POA: Diagnosis not present

## 2020-05-06 DIAGNOSIS — Z79899 Other long term (current) drug therapy: Secondary | ICD-10-CM

## 2020-05-06 DIAGNOSIS — R569 Unspecified convulsions: Secondary | ICD-10-CM

## 2020-05-06 DIAGNOSIS — M1990 Unspecified osteoarthritis, unspecified site: Secondary | ICD-10-CM | POA: Diagnosis present

## 2020-05-06 DIAGNOSIS — D696 Thrombocytopenia, unspecified: Secondary | ICD-10-CM | POA: Diagnosis present

## 2020-05-06 DIAGNOSIS — I6783 Posterior reversible encephalopathy syndrome: Secondary | ICD-10-CM | POA: Diagnosis present

## 2020-05-06 DIAGNOSIS — M3214 Glomerular disease in systemic lupus erythematosus: Secondary | ICD-10-CM | POA: Diagnosis present

## 2020-05-06 DIAGNOSIS — G40909 Epilepsy, unspecified, not intractable, without status epilepticus: Secondary | ICD-10-CM | POA: Diagnosis present

## 2020-05-06 DIAGNOSIS — N2581 Secondary hyperparathyroidism of renal origin: Secondary | ICD-10-CM | POA: Diagnosis present

## 2020-05-06 DIAGNOSIS — I161 Hypertensive emergency: Secondary | ICD-10-CM | POA: Diagnosis present

## 2020-05-06 DIAGNOSIS — I120 Hypertensive chronic kidney disease with stage 5 chronic kidney disease or end stage renal disease: Secondary | ICD-10-CM | POA: Diagnosis present

## 2020-05-06 DIAGNOSIS — F17210 Nicotine dependence, cigarettes, uncomplicated: Secondary | ICD-10-CM | POA: Diagnosis present

## 2020-05-06 DIAGNOSIS — I10 Essential (primary) hypertension: Secondary | ICD-10-CM | POA: Diagnosis present

## 2020-05-06 LAB — CBC WITH DIFFERENTIAL/PLATELET
Abs Immature Granulocytes: 0.03 10*3/uL (ref 0.00–0.07)
Basophils Absolute: 0 10*3/uL (ref 0.0–0.1)
Basophils Relative: 0 %
Eosinophils Absolute: 0 10*3/uL (ref 0.0–0.5)
Eosinophils Relative: 0 %
HCT: 37.2 % (ref 36.0–46.0)
Hemoglobin: 11.8 g/dL — ABNORMAL LOW (ref 12.0–15.0)
Immature Granulocytes: 0 %
Lymphocytes Relative: 8 %
Lymphs Abs: 0.6 10*3/uL — ABNORMAL LOW (ref 0.7–4.0)
MCH: 30.7 pg (ref 26.0–34.0)
MCHC: 31.7 g/dL (ref 30.0–36.0)
MCV: 96.9 fL (ref 80.0–100.0)
Monocytes Absolute: 0.4 10*3/uL (ref 0.1–1.0)
Monocytes Relative: 5 %
Neutro Abs: 6.4 10*3/uL (ref 1.7–7.7)
Neutrophils Relative %: 87 %
Platelets: 62 10*3/uL — ABNORMAL LOW (ref 150–400)
RBC: 3.84 MIL/uL — ABNORMAL LOW (ref 3.87–5.11)
RDW: 15.2 % (ref 11.5–15.5)
WBC: 7.3 10*3/uL (ref 4.0–10.5)
nRBC: 0 % (ref 0.0–0.2)

## 2020-05-06 LAB — COMPREHENSIVE METABOLIC PANEL
ALT: 10 U/L (ref 0–44)
AST: 26 U/L (ref 15–41)
Albumin: 3.8 g/dL (ref 3.5–5.0)
Alkaline Phosphatase: 57 U/L (ref 38–126)
Anion gap: 17 — ABNORMAL HIGH (ref 5–15)
BUN: 54 mg/dL — ABNORMAL HIGH (ref 6–20)
CO2: 19 mmol/L — ABNORMAL LOW (ref 22–32)
Calcium: 9.6 mg/dL (ref 8.9–10.3)
Chloride: 105 mmol/L (ref 98–111)
Creatinine, Ser: 13.49 mg/dL — ABNORMAL HIGH (ref 0.44–1.00)
GFR calc Af Amer: 4 mL/min — ABNORMAL LOW (ref >60)
GFR calc non Af Amer: 3 mL/min — ABNORMAL LOW (ref >60)
Glucose, Bld: 104 mg/dL — ABNORMAL HIGH (ref 70–99)
Potassium: 4.5 mmol/L (ref 3.5–5.1)
Sodium: 141 mmol/L (ref 135–145)
Total Bilirubin: 1 mg/dL (ref 0.3–1.2)
Total Protein: 7.6 g/dL (ref 6.5–8.1)

## 2020-05-06 LAB — TROPONIN I (HIGH SENSITIVITY): Troponin I (High Sensitivity): 19 ng/L — ABNORMAL HIGH (ref <18)

## 2020-05-06 MED ORDER — LEVETIRACETAM IN NACL 500 MG/100ML IV SOLN
500.0000 mg | Freq: Two times a day (BID) | INTRAVENOUS | Status: DC
  Administered 2020-05-07 – 2020-05-10 (×8): 500 mg via INTRAVENOUS
  Filled 2020-05-06 (×9): qty 100

## 2020-05-06 MED ORDER — HEPARIN SODIUM (PORCINE) 5000 UNIT/ML IJ SOLN
5000.0000 [IU] | Freq: Three times a day (TID) | INTRAMUSCULAR | Status: DC
  Administered 2020-05-07: 5000 [IU] via SUBCUTANEOUS
  Filled 2020-05-06: qty 1

## 2020-05-06 MED ORDER — DOCUSATE SODIUM 100 MG PO CAPS: 100.0000 mg | ORAL_CAPSULE | Freq: Two times a day (BID) | ORAL | Status: DC | PRN

## 2020-05-06 MED ORDER — LORAZEPAM 2 MG/ML IJ SOLN: 2.0000 mg | Freq: Once | INTRAMUSCULAR | Status: AC

## 2020-05-06 MED ORDER — LORAZEPAM 2 MG/ML IJ SOLN: 1.0000 mg | Freq: Once | INTRAMUSCULAR | Status: DC

## 2020-05-06 MED ORDER — LEVETIRACETAM IN NACL 1000 MG/100ML IV SOLN
1000.0000 mg | Freq: Once | INTRAVENOUS | Status: AC
  Administered 2020-05-06: 1000 mg via INTRAVENOUS
  Filled 2020-05-06: qty 100

## 2020-05-06 MED ORDER — LABETALOL HCL 5 MG/ML IV SOLN
20.0000 mg | Freq: Once | INTRAVENOUS | Status: AC
  Administered 2020-05-06: 20 mg via INTRAVENOUS
  Filled 2020-05-06: qty 4

## 2020-05-06 MED ORDER — LORAZEPAM 2 MG/ML IJ SOLN
INTRAMUSCULAR | Status: AC
  Administered 2020-05-06: 2 mg via INTRAMUSCULAR
  Filled 2020-05-06: qty 1

## 2020-05-06 MED ORDER — KETAMINE HCL 50 MG/ML IJ SOLN
4.0000 mg/kg | Freq: Once | INTRAMUSCULAR | Status: AC
  Administered 2020-05-06: 190 mg via INTRAMUSCULAR
  Filled 2020-05-06: qty 10

## 2020-05-06 MED ORDER — POLYETHYLENE GLYCOL 3350 17 G PO PACK: 17.0000 g | PACK | Freq: Every day | ORAL | Status: DC | PRN

## 2020-05-06 MED ORDER — NICARDIPINE HCL IN NACL 20-0.86 MG/200ML-% IV SOLN
3.0000 mg/h | INTRAVENOUS | Status: DC
  Administered 2020-05-06: 5 mg/h via INTRAVENOUS
  Filled 2020-05-06: qty 200
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
## 2020-05-06 NOTE — Sedation Documentation (Signed)
Family at bedside. 
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
## 2020-05-06 NOTE — ED Notes (Signed)
As per charge RN. We are no longer treating this pt as a sedation, but as a chemical restraint. Sedation documentation ended at 2325.

## 2020-05-06 NOTE — ED Triage Notes (Signed)
Ppt BIB EMS for a grandmal seizure that was witnessed and lasted for 30-60 seconds.. Pt has history of seizures. Pt was supposed to go to dialysis today, but did not. Pt got 1mg  of versed at 930pm and and another 1mg  of versed at 940. As per EMS pt is confused and not answering questions. Due to this, unknown allergies and unable to fully complete triage information. Family is not at bedside and EMS stated they do not know if family will come.

## 2020-05-06 NOTE — ED Notes (Signed)
When pt got onto bed, pt became agitated and combative. 6 hospital staff had to hold pt down due to safety, as pt was throwing body around. Seizure pads placed on bed. Pt placed in restraints due to combative nature. Pt is not alert and not making cognitive sence. EMS IV was removed during the process of pt moving around. PT to get IM ketamine. Pt attempted to bite this RN and other hospital staff \\[phone] \

## 2020-05-07 ENCOUNTER — Inpatient Hospital Stay: Payer: Medicare Other

## 2020-05-07 DIAGNOSIS — R569 Unspecified convulsions: Secondary | ICD-10-CM

## 2020-05-07 LAB — URINALYSIS, COMPLETE (UACMP) WITH MICROSCOPIC
Bilirubin Urine: NEGATIVE
Glucose, UA: NEGATIVE mg/dL
Ketones, ur: NEGATIVE mg/dL
Nitrite: NEGATIVE
Protein, ur: >=300 mg/dL — AB
Specific Gravity, Urine: 1.012 (ref 1.005–1.030)
WBC, UA: >50 WBC/hpf — ABNORMAL HIGH (ref 0–5)
pH: 8 (ref 5.0–8.0)

## 2020-05-07 LAB — BASIC METABOLIC PANEL
Anion gap: 15 (ref 5–15)
BUN: 60 mg/dL — ABNORMAL HIGH (ref 6–20)
CO2: 14 mmol/L — ABNORMAL LOW (ref 22–32)
Calcium: 8.4 mg/dL — ABNORMAL LOW (ref 8.9–10.3)
Chloride: 111 mmol/L (ref 98–111)
Creatinine, Ser: 12.27 mg/dL — ABNORMAL HIGH (ref 0.44–1.00)
GFR calc Af Amer: 4 mL/min — ABNORMAL LOW (ref >60)
GFR calc non Af Amer: 3 mL/min — ABNORMAL LOW (ref >60)
Glucose, Bld: 90 mg/dL (ref 70–99)
Potassium: 4.2 mmol/L (ref 3.5–5.1)
Sodium: 140 mmol/L (ref 135–145)

## 2020-05-07 LAB — CBC
HCT: 39.9 % (ref 36.0–46.0)
HCT: 40.3 % (ref 36.0–46.0)
Hemoglobin: 12.6 g/dL (ref 12.0–15.0)
Hemoglobin: 12.6 g/dL (ref 12.0–15.0)
MCH: 30.2 pg (ref 26.0–34.0)
MCH: 30.7 pg (ref 26.0–34.0)
MCHC: 31.3 g/dL (ref 30.0–36.0)
MCHC: 31.6 g/dL (ref 30.0–36.0)
MCV: 95.7 fL (ref 80.0–100.0)
MCV: 98.1 fL (ref 80.0–100.0)
Platelets: 45 10*3/uL — ABNORMAL LOW (ref 150–400)
Platelets: 51 10*3/uL — ABNORMAL LOW (ref 150–400)
RBC: 4.11 MIL/uL (ref 3.87–5.11)
RBC: 4.17 MIL/uL (ref 3.87–5.11)
RDW: 15.3 % (ref 11.5–15.5)
RDW: 15.4 % (ref 11.5–15.5)
WBC: 10.6 10*3/uL — ABNORMAL HIGH (ref 4.0–10.5)
WBC: 8.4 10*3/uL (ref 4.0–10.5)
nRBC: 0 % (ref 0.0–0.2)
nRBC: 0 % (ref 0.0–0.2)

## 2020-05-07 LAB — PHOSPHORUS: Phosphorus: 4.7 mg/dL — ABNORMAL HIGH (ref 2.5–4.6)

## 2020-05-07 LAB — SARS CORONAVIRUS 2 BY RT PCR (HOSPITAL ORDER, PERFORMED IN ~~LOC~~ HOSPITAL LAB): SARS Coronavirus 2: NEGATIVE

## 2020-05-07 LAB — TROPONIN I (HIGH SENSITIVITY): Troponin I (High Sensitivity): 23 ng/L — ABNORMAL HIGH (ref <18)

## 2020-05-07 LAB — URINE DRUG SCREEN, QUALITATIVE (ARMC ONLY)
Amphetamines, Ur Screen: NOT DETECTED
Barbiturates, Ur Screen: NOT DETECTED
Benzodiazepine, Ur Scrn: POSITIVE — AB
Cannabinoid 50 Ng, Ur ~~LOC~~: POSITIVE — AB
Cocaine Metabolite,Ur ~~LOC~~: NOT DETECTED
MDMA (Ecstasy)Ur Screen: NOT DETECTED
Methadone Scn, Ur: NOT DETECTED
Opiate, Ur Screen: NOT DETECTED
Phencyclidine (PCP) Ur S: NOT DETECTED
Tricyclic, Ur Screen: NOT DETECTED

## 2020-05-07 LAB — MAGNESIUM: Magnesium: 2 mg/dL (ref 1.7–2.4)

## 2020-05-07 LAB — HEPATITIS B SURFACE ANTIGEN: Hepatitis B Surface Ag: NONREACTIVE

## 2020-05-07 LAB — HCG, QUANTITATIVE, PREGNANCY: hCG, Beta Chain, Quant, S: 2 m[IU]/mL (ref <5)

## 2020-05-07 MED ORDER — LORAZEPAM 2 MG/ML IJ SOLN
1.0000 mg | Freq: Once | INTRAMUSCULAR | Status: AC
  Administered 2020-05-07: 1 mg via INTRAVENOUS
  Filled 2020-05-07: qty 0.5
  Filled 2020-05-07: qty 1

## 2020-05-07 MED ORDER — CIPROFLOXACIN IN D5W 400 MG/200ML IV SOLN
400.0000 mg | Freq: Once | INTRAVENOUS | Status: AC
  Administered 2020-05-07: 400 mg via INTRAVENOUS
  Filled 2020-05-07: qty 200

## 2020-05-07 MED ORDER — LEVETIRACETAM IN NACL 500 MG/100ML IV SOLN
500.0000 mg | INTRAVENOUS | Status: DC
  Filled 2020-05-07: qty 100

## 2020-05-07 MED ORDER — CHLORHEXIDINE GLUCONATE CLOTH 2 % EX PADS
6.0000 | MEDICATED_PAD | Freq: Every day | CUTANEOUS | Status: DC
  Administered 2020-05-08 – 2020-05-09 (×2): 6 via TOPICAL
  Filled 2020-05-07: qty 6

## 2020-05-07 MED ORDER — CIPROFLOXACIN IN D5W 400 MG/200ML IV SOLN
400.0000 mg | INTRAVENOUS | Status: DC
  Administered 2020-05-08: 400 mg via INTRAVENOUS
  Filled 2020-05-07: qty 200

## 2020-05-07 MED ORDER — ONDANSETRON HCL 4 MG/2ML IJ SOLN
4.0000 mg | Freq: Four times a day (QID) | INTRAMUSCULAR | Status: DC | PRN
  Administered 2020-05-07: 4 mg via INTRAVENOUS
  Filled 2020-05-07: qty 2

## 2020-05-07 MED ORDER — LORAZEPAM 2 MG/ML IJ SOLN: 2.0000 mg | Freq: Once | INTRAMUSCULAR | Status: DC | PRN

## 2020-05-07 MED ORDER — PROMETHAZINE HCL 25 MG/ML IJ SOLN: 12.5000 mg | Freq: Four times a day (QID) | INTRAMUSCULAR | Status: DC | PRN

## 2020-05-07 MED ORDER — DEXMEDETOMIDINE HCL IN NACL 400 MCG/100ML IV SOLN: 0.4000 ug/kg/h | INTRAVENOUS | Status: DC

## 2020-05-07 MED ORDER — LORAZEPAM 2 MG/ML IJ SOLN
1.0000 mg | Freq: Once | INTRAMUSCULAR | Status: AC
  Administered 2020-05-07: 1 mg via INTRAVENOUS

## 2020-05-07 MED ORDER — SODIUM CHLORIDE 0.9 % IV SOLN
15.0000 mg/kg | Freq: Once | INTRAVENOUS | Status: AC
  Administered 2020-05-07: 714 mg via INTRAVENOUS
  Filled 2020-05-07: qty 12

## 2020-05-07 MED ORDER — DROPERIDOL 2.5 MG/ML IJ SOLN
2.5000 mg | Freq: Once | INTRAMUSCULAR | Status: AC
  Administered 2020-05-07: 2.5 mg via INTRAVENOUS
  Filled 2020-05-07: qty 2

## 2020-05-07 NOTE — ED Notes (Signed)
Pt moving around in bed taking blankets off saying "i cannot do this it is to hot". Explained to pt i turned the ac up and I could open door to room to cool down faster but pt would have to keep gown on, pt still proceeded to take off all coverings.

## 2020-05-07 NOTE — ED Notes (Signed)
Pt got up to use bathroom, needed assistance walking, was very unsteady.

## 2020-05-07 NOTE — Progress Notes (Signed)
Pharmacy Antibiotic Note  Patricia Maldonado is a 39 y.o. female admitted on 05/06/2020 with UTI.  Pharmacy has been consulted for Cipro dosing.  Plan: Cipro 400mg  IV q24hrs (renally adjusted)     Temp (24hrs), Avg:97.5 F (36.4 C), Min:97.5 F (36.4 C), Max:97.5 F (36.4 C)  Recent Labs  Lab 05/06/20 0023 05/06/20 2245 05/07/20 0344  WBC 10.6* 7.3 8.4  CREATININE  --  13.49*  --     Estimated Creatinine Clearance: 4.3 mL/min (A) (by C-G formula based on SCr of 13.49 mg/dL (H)).    Allergies  Allergen Reactions  . Penicillins Swelling    Yeast infection Has patient had a PCN reaction causing immediate rash, facial/tongue/throat swelling, SOB or lightheadedness with hypotension:No Has patient had a PCN reaction causing severe rash involving mucus membranes or skin necrosis: Yes Has patient had a PCN reaction that required hospitalization No Has patient had a PCN reaction occurring within the last 10 years: No If all of the above answers are "NO", then may proceed with Cephalosporin use.  Yeast infection Has patient had a PCN reaction causing immediate rash, facial/tongue/throat swelling, SOB or lightheadedness with hypotension:No Has patient had a PCN reaction causing severe rash involving mucus membranes or skin necrosis: Yes Has patient had a PCN reaction that required hospitalization No Has patient had a PCN reaction occurring within the last 10 years: No If all of the above answers are "NO", then may proceed with Cephalosporin use.    Antimicrobials this admission:   >>    >>   Dose adjustments this admission:   Microbiology results:  BCx:   UCx:    Sputum:    MRSA PCR:   Thank you for allowing pharmacy to be a part of this patient's care.  Hart Robinsons A 05/07/2020 6:34 AM

## 2020-05-07 NOTE — Procedures (Signed)
eeg to be done 8/5-pt indialysis

## 2020-05-07 NOTE — ED Notes (Signed)
Pt is laying in bed. Approximately 15 minutes ago, pt became agitated, kept getting up and down in bed, saying she needed to urinate. Pt would be placed on bed pan and then not urinate, get off bed pain and say she needs to urinate. Pt did calm down after she was able to urinate. She allowed the monitors to be placed back on her. NT at bedside.  As per provider, precedex only needs to be given for sever agitation and pt ripping off monitor.  Pt is now calm

## 2020-05-07 NOTE — Progress Notes (Signed)
Hemodialysis patient known at [facility] ([place]) MWF 11:20. Patient normally rides with [facility]. Please contact me with any dialysis placement concerns.  Elvera Bicker Dialysis Coordinator [phone]

## 2020-05-07 NOTE — ED Notes (Signed)
RN attempted to call report. This RN was told "No one has approved this bed, so no nurse is assigned." Agricultural consultant notified.

## 2020-05-07 NOTE — ED Notes (Signed)
Admit team messaged at approximately [phone] notifying that pt is still nauseated. No answer. ER provider notified and placed order for droperidol.

## 2020-05-07 NOTE — ED Notes (Signed)
Sitter remains with patient- VSS, will continue to monitor

## 2020-05-07 NOTE — ED Notes (Signed)
MRI on phone to do screening

## 2020-05-07 NOTE — Progress Notes (Signed)
Urinalysis able to be obtained from pt, and appears consistent with UTI.  Will add on urine culture, and place pt on Ceftriaxone for now pending culture and sensitivities.      Darel Hong, AGACNP-BC [facility] Pager: [phone]

## 2020-05-07 NOTE — ED Notes (Signed)
Pt allowed RN to place cardiac monitor on her back

## 2020-05-07 NOTE — ED Notes (Signed)
Pt placed on bedpan and urine sample obtained; pt now sleeping. This tech remains as 1:1 Actuary.

## 2020-05-07 NOTE — ED Notes (Signed)
Report given to dialysis nurse.

## 2020-05-07 NOTE — ED Notes (Signed)
Pt is laying in bed. Sitter at bedside. Pt is laying and is calm with blankets on. NAD noted.

## 2020-05-07 NOTE — Progress Notes (Signed)
[facility]  ROUNDING NOTE   Subjective:  Patient well-known to [place] from prior admission from prior care. She missed hemodialysis yesterday. Patient found to have seizure episode yesterday. As an outpatient she has been on Keppra.    Objective:  Vital signs in last 24 hours:  Temp:  [97.5 F (36.4 C)] 97.5 F (36.4 C) (08/04 2235) Pulse Rate:  [67-112] 95 (08/05 0815) Resp:  [11-28] 19 (08/05 0815) BP: (123-226)/(87-138) 148/116 (08/05 0815) SpO2:  [97 %-100 %] 99 % (08/05 0815)  Weight change:  There were no vitals filed for this visit.  Intake/Output: I/O last 3 completed shifts: In: 161.5 [I.V.:161.5] Out: -    Intake/Output this shift:  Total I/O In: 300 [IV Piggyback:300] Out: -   Physical Exam: General: No acute distress  Head: Normocephalic, atraumatic. Moist oral mucosal membranes  Eyes: Anicteric  Neck: Supple  Lungs:  Clear to auscultation, normal effort  Heart: S1S2 no rubs  Abdomen:  Soft, nontender, bowel sounds present  Extremities: Trace peripheral edema.  Neurologic: Awake, alert, following commands  Skin: No lesions  Access: IJ PermCath    Basic Metabolic Panel: Recent Labs  Lab 05/06/20 2245 05/07/20 0607  NA 141 140  K 4.5 4.2  CL 105 111  CO2 19* 14*  GLUCOSE 104* 90  BUN 54* 60*  CREATININE 13.49* 12.27*  CALCIUM 9.6 8.4*  MG  --  2.0  PHOS  --  4.7*    Liver Function Tests: Recent Labs  Lab 05/06/20 2245  AST 26  ALT 10  ALKPHOS 57  BILITOT 1.0  PROT 7.6  ALBUMIN 3.8   No results for input(s): LIPASE, AMYLASE in the last 168 hours. No results for input(s): AMMONIA in the last 168 hours.  CBC: Recent Labs  Lab 05/06/20 0023 05/06/20 2245 05/07/20 0344  WBC 10.6* 7.3 8.4  NEUTROABS  --  6.4  --   HGB 12.6 11.8* 12.6  HCT 40.3 37.2 39.9  MCV 98.1 96.9 95.7  PLT 51* 62* 45*    Cardiac Enzymes: No results for input(s): CKTOTAL, CKMB, CKMBINDEX, TROPONINI in the last 168 hours.  BNP: Invalid  input(s): POCBNP  CBG: No results for input(s): GLUCAP in the last 168 hours.  Microbiology: Results for orders placed or performed during the hospital encounter of 05/06/20  SARS Coronavirus 2 by RT PCR (hospital order, performed in [facility] hospital lab) Nasopharyngeal Nasopharyngeal Swab     Status: None   Collection Time: 05/06/20 10:45 PM   Specimen: Nasopharyngeal Swab  Result Value Ref Range Status   SARS Coronavirus 2 NEGATIVE NEGATIVE Final    Comment: (NOTE) SARS-CoV-2 target nucleic acids are NOT DETECTED.  The SARS-CoV-2 RNA is generally detectable in upper and lower respiratory specimens during the acute phase of infection. The lowest concentration of SARS-CoV-2 viral copies this assay can detect is 250 copies / mL. A negative result does not preclude SARS-CoV-2 infection and should not be used as the sole basis for treatment or other patient management decisions.  A negative result may occur with improper specimen collection / handling, submission of specimen other than nasopharyngeal swab, presence of viral mutation(s) within the areas targeted by this assay, and inadequate number of viral copies (<250 copies / mL). A negative result must be combined with clinical observations, patient history, and epidemiological information.  Fact Sheet for Patients:   StrictlyIdeas.no  Fact Sheet for Healthcare Providers: BankingDealers.co.za  This test is not yet approved or  cleared by the  [place] Architectural technologist and has been authorized for detection and/or diagnosis of SARS-CoV-2 by FDA under an Print production planner (EUA).  This EUA will remain in effect (meaning this test can be used) for the duration of the COVID-19 declaration under Section 564(b)(1) of the Act, 21 U.S.C. section 360bbb-3(b)(1), unless the authorization is terminated or revoked sooner.  Performed at [facility], [address]  [place], [place] [address]     Coagulation Studies: No results for input(s): LABPROT, INR in the last 72 hours.  Urinalysis: Recent Labs    05/07/20 0344  COLORURINE YELLOW*  LABSPEC 1.012  PHURINE 8.0  GLUCOSEU NEGATIVE  HGBUR LARGE*  BILIRUBINUR NEGATIVE  KETONESUR NEGATIVE  PROTEINUR >=300*  NITRITE NEGATIVE  LEUKOCYTESUR MODERATE*      Imaging: CT Head Wo Contrast  Result Date: 05/06/2020 CLINICAL DATA:  Seizure. EXAM: CT HEAD WITHOUT CONTRAST TECHNIQUE: Contiguous axial images were obtained from the base of the skull through the vertex without intravenous contrast. COMPARISON:  December 23, 2019 FINDINGS: Brain: No evidence of acute infarction, hemorrhage, hydrocephalus, extra-axial collection or mass lesion/mass effect. Vascular: No hyperdense vessel or unexpected calcification. Skull: Normal. Negative for fracture or focal lesion. Sinuses/Orbits: No acute finding. Other: None. IMPRESSION: No acute intracranial pathology. Electronically Signed   By: Virgina Norfolk M.D.   On: 05/06/2020 23:02   DG Chest Portable 1 View  Result Date: 05/06/2020 CLINICAL DATA:  Tunneled dialysis catheter placement. EXAM: PORTABLE CHEST 1 VIEW COMPARISON:  07/27/2017 FINDINGS: There is a right-sided dialysis catheter with tip terminating over the right atrium. The heart size is stable but mildly enlarged. There is no pneumothorax. No significant pleural effusion. No focal infiltrate. No acute osseous abnormality. IMPRESSION: No active disease. Electronically Signed   By: Constance Holster M.D.   On: 05/06/2020 22:52     Medications:    [START ON 05/08/2020] ciprofloxacin     dexmedetomidine (PRECEDEX) IV infusion Stopped (05/07/20 0439)   fosPHENYtoin (CEREBYX) IV     levETIRAcetam Stopped (05/07/20 1014)   [START ON 05/08/2020] levETIRAcetam     niCARDipine Stopped (05/07/20 0457)    [START ON 05/08/2020] Chlorhexidine Gluconate Cloth  6 each Topical Q0600   docusate sodium, LORazepam,  ondansetron (ZOFRAN) IV, polyethylene glycol, promethazine  Assessment/ Plan:  39 y.o. female with past medical history of ESRD on HD MWF secondary to lupus nephritis, hypertension, seizure disorder, staghorn calculus, anemia of chronic kidney disease, secondary hyperparathyroidism, history of PRES who presented with seizure episode.  [facility]/MWF/[facility]  1.  ESRD on HD MWF.  Patient missed hemodialysis treatment yesterday.  Therefore we will plan for hemodialysis today and tomorrow.  Orders have been prepared.  2.  Hypertension patient was on nicardipine drip but now has been taken off.  Restart losartan 50 mg daily.  3.  Anemia of chronic kidney disease.  Hemoglobin 12.6.  Hold off on Epogen at this time.  4.  Secondary hyperparathyroidism.  Check PTH and serum phosphorus today.  5.  Thanks for consultation.   LOS: 1 Xzavian Semmel 8/5/20211:34 PM

## 2020-05-07 NOTE — H&P (Signed)
Name: Patricia Maldonado MRN: [id number] DOB: 1980-10-27    ADMISSION DATE:  05/06/2020 CONSULTATION DATE:  05/06/2020  REFERRING MD :  Dr. Charna Archer  CHIEF COMPLAINT:  Seizure, AMS  BRIEF PATIENT DESCRIPTION:  [name] with PMH of ESRD on HD and seizures admitted due to witnessed tonic-clonic seizure, AMS, Hypertensive emergency requiring Nicardipine gtt, and questionable PRES syndrome.  Nephrology and Neurology are consulted.  SIGNIFICANT EVENTS  8/4: Presented to ED; PCCM consulted for admission  STUDIES:  8/4: CXR>>There is a right-sided dialysis catheter with tip terminating over the right atrium. The heart size is stable but mildly enlarged. There is no pneumothorax. No significant pleural effusion. No focal infiltrate. No acute osseous abnormality. 8/4: CT Head w/o contrast>>No acute intracranial pathology.  CULTURES: SARS-CoV-2 PCR 8/4>> negative  ANTIBIOTICS: N/A  HISTORY OF PRESENT ILLNESS:   Patricia Maldonado is a 39 year old female with a past medical history significant for ESRD on HD (Monday, Wednesday, Friday), seizure disorder, hypertension, and lupus who presented to [facility] ED on 05/06/2020 following a witnessed seizure.  Patient is currently in the postictal state and sedated with no family present, therefore history is obtained from ED and nursing notes.  Per notes EMS was dispatched due to the patient feeling malaise throughout the day with headache and nausea, of which she missed her scheduled hemodialysis treatment today (reportedly received HD 2 days ago).  Shortly after the fire department arrived, she had a witnessed 30-60 second generalized tonic-clonic seizure.  She required 1 mg of Versed x2, but remained agitated during transport.  Upon arrival to the ED she remained confused, agitated, combative, and trying to pull out her Dialysis catheter.  She was noted to be markedly hypertensive with blood pressure 218/124.  She was given Ativan without improvement in  agitation,  subsequently given IM Ketamine in order to facilitate medical treatment, and due to risk to herself and medical staff.  She was also given labetalol without improvement in BP, therefore was placed on Nicardipine drip.  Initial work-up in the ED revealed WBC 7.3, hemoglobin 11.8, platelets 62, high-sensitivity troponin 19, hCG normal, bicarb 19, BUN 54, creatinine 13.49.  CT head is negative for any acute intracranial abnormality.  Chest x-ray is negative.  Her COVID-19 PCR is negative.  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  Nephrology and Neurology have been consulted.  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  has a past surgical history that includes Tubal ligation; IR NEPHROSTOMY PLACEMENT RIGHT (01/23/2017); Nephrolithotomy (Right, 01/24/2017); Cystoscopy/ureteroscopy/holmium laser/stent placement (Right, 02/21/2017); DIALYSIS/PERMA CATHETER INSERTION (N/A, 11/30/2018); and Peritoneal catheter insertion. Prior to Admission medications   Medication Sig Start Date End Date Taking? Authorizing Provider  cinacalcet (SENSIPAR) 30 MG tablet Take 30 mg by mouth daily.    [provider]  levETIRAcetam (KEPPRA) 500 MG tablet Take 1 tablet (500 mg total) by mouth daily. Patient not taking: Reported on 12/23/2019 12/22/19   Max Sane, MD  losartan (COZAAR) 50 MG tablet Take 1 tablet (50 mg total) by mouth every evening. Patient not taking: Reported on 12/23/2019 12/22/19   Max Sane, MD  mycophenolate (CELLCEPT) 500 MG tablet Take 2 tablets (1,000 mg total) by mouth 2 (two)  times daily. 04/30/18   Sudini, Alveta Heimlich, MD  RENAGEL 800  MG tablet Take 800 mg by mouth 3 (three) times daily with meals. (at meals at with a snack)    [provider]   Allergies  Allergen Reactions   Penicillins Swelling    Yeast infection Has patient had a PCN reaction causing immediate rash, facial/tongue/throat swelling, SOB or lightheadedness with hypotension:No Has patient had a PCN reaction causing severe rash involving mucus membranes or skin necrosis: Yes Has patient had a PCN reaction that required hospitalization No Has patient had a PCN reaction occurring within the last 10 years: No If all of the above answers are "NO", then may proceed with Cephalosporin use.  Yeast infection Has patient had a PCN reaction causing immediate rash, facial/tongue/throat swelling, SOB or lightheadedness with hypotension:No Has patient had a PCN reaction causing severe rash involving mucus membranes or skin necrosis: Yes Has patient had a PCN reaction that required hospitalization No Has patient had a PCN reaction occurring within the last 10 years: No If all of the above answers are "NO", then may proceed with Cephalosporin use.    FAMILY HISTORY:  family history includes Diabetes in her father; Thyroid disease in her father and mother. SOCIAL HISTORY:  reports that she has been smoking cigarettes. She has a 3.75 pack-year smoking history. She has never used smokeless tobacco. She reports current drug use. Drug: Marijuana. She reports that she does not drink alcohol.   COVID-19 DISASTER DECLARATION:  FULL CONTACT PHYSICAL EXAMINATION WAS NOT POSSIBLE DUE TO TREATMENT OF COVID-19 AND  CONSERVATION OF PERSONAL PROTECTIVE EQUIPMENT, LIMITED EXAM FINDINGS INCLUDE-  Patient assessed or the symptoms described in the history of present illness.  In the context of the Global COVID-19 pandemic, which necessitated consideration that the patient might be at risk for infection with the SARS-CoV-2 virus that causes COVID-19, Institutional  protocols and algorithms that pertain to the evaluation of patients at risk for COVID-19 are in a state of rapid change based on information released by regulatory bodies including the CDC and federal and state organizations. These policies and algorithms were followed during the patient's care while in hospital.  REVIEW OF SYSTEMS:   Unable to assess due to sedation and  AMS.  SUBJECTIVE:  Unable to assess due to sedation and  AMS  VITAL SIGNS: Temp:  [97.5 F (36.4 C)] 97.5 F (36.4 C) (08/04 2235) Pulse Rate:  [67-93] 93 (08/05 0010) Resp:  [11-28] 22 (08/05 0010) BP: (128-226)/(97-138) 128/97 (08/05 0010) SpO2:  [98 %-100 %] 99 % (08/05 0010)  PHYSICAL EXAMINATION: General: Acute on chronically ill-appearing female, laying in bed, sedated, maintaining her airway, no acute distress Neuro: Sedated (received IM ketamine), withdraws from pain, pupils PERRLA HEENT: Atraumatic, normocephalic, neck supple, no JVD Cardiovascular: Regular rate and rhythm, S1-S2, no murmurs, rubs, gallops, 2+ pulses throughout Lungs: Clear to auscultation throughout, no wheezing or rales, even, nonlabored, maintaining her airway Abdomen: Soft, nontender, nondistended, no guarding or rebound tenderness, sounds positive x4 Musculoskeletal: Normal bulk and tone, no deformities, no edema Skin: Warm and dry.  No obvious rashes, lesions, ulcerations  Recent Labs  Lab 05/06/20 2245  NA 141  K 4.5  CL 105  CO2 19*  BUN 54*  CREATININE 13.49*  GLUCOSE 104*   Recent Labs  Lab 05/06/20 2245  HGB 11.8*  HCT 37.2  WBC 7.3  PLT 62*   CT Head Wo Contrast  Result Date: 05/06/2020 CLINICAL DATA:  Seizure. EXAM: CT HEAD  WITHOUT CONTRAST TECHNIQUE: Contiguous axial images were obtained from the base of the skull through the vertex without intravenous contrast. COMPARISON:  December 23, 2019 FINDINGS: Brain: No evidence of acute infarction, hemorrhage, hydrocephalus, extra-axial collection or mass lesion/mass  effect. Vascular: No hyperdense vessel or unexpected calcification. Skull: Normal. Negative for fracture or focal lesion. Sinuses/Orbits: No acute finding. Other: None. IMPRESSION: No acute intracranial pathology. Electronically Signed   By: Virgina Norfolk M.D.   On: 05/06/2020 23:02   DG Chest Portable 1 View  Result Date: 05/06/2020 CLINICAL DATA:  Tunneled dialysis catheter placement. EXAM: PORTABLE CHEST 1 VIEW COMPARISON:  07/27/2017 FINDINGS: There is a right-sided dialysis catheter with tip terminating over the right atrium. The heart size is stable but mildly enlarged. There is no pneumothorax. No significant pleural effusion. No focal infiltrate. No acute osseous abnormality. IMPRESSION: No active disease. Electronically Signed   By: Constance Holster M.D.   On: 05/06/2020 22:52    ASSESSMENT / PLAN:  Tonic-clonic seizure ? PRES Syndrome Altered Mental Status -Provide supportive care -Received IM Ketamine in ED due to severe agitation and combativeness -Precedex if needed -CT Head 8/4 negative -Electrolytes WNL -IV Keppra -Check Urine drug screen -Obtain EEG -HCG is negative -Control Hypertension with Nicardipine gtt -Neurology consulted, appreciate input  Hypertensive Emergency Mildly elevated troponin, suspect demand ischemia Hx: HTN -Continuous cardiac monitoring -Maintain MAP greater than 65 -Nicardipine drip to decrease BP by 20-25% in first several hours, titrate for SBP 140-160 -Can restart home BP medications when mental status permits PO intake -Trend troponin ~ 19 ~ 23  ESRD on HD -Monitor I&O's / urinary output -Follow BMP -Ensure adequate renal perfusion -Avoid nephrotoxic agents as able -Replace electrolytes as indicated -Nephrology consulted, appreciate input -Hemodialysis as per nephrology  Anemia of Chronic Disease Thrombocytopenia -Monitor for S/Sx of bleeding -Trend CBC -SCD's for VTE Prophylaxis (no chemical prophylaxis given  thrombocytopenia) -Transfuse for Hgb <7 -Transfuse platelets for platelet count less than 50 with active bleeding            BEST PRACTICES DISPOSITION: ICU GOALS OF CARE: Full Code VTE PROPHYLAXIS: SCD's CONSULTS: Nephrology, Neurology UPDATES: No family present at bedside 05/07/20 during NP rounds   Darel Hong, [facility] [place] Pager: [phone]  05/07/2020, 12:15 AM

## 2020-05-07 NOTE — ED Notes (Signed)
RN have attempted to call report two times on this pt. Floor states pt is not going to progressive care, but to step down and cant come up. ER charge RN notified.

## 2020-05-07 NOTE — ED Notes (Signed)
ER provider notified of BP of 126/94 with nicardipine at 3mg /hr. ER provider stated to leave the nicardipine at the 3mg /hr. Pt on cardiac, bp and pulse ox monitor.  Sitter is now at bedside.

## 2020-05-07 NOTE — ED Notes (Signed)
Nicardipine gtt discontinued as per provider. Provider wants systolic BP targeted at 292T-090B.

## 2020-05-07 NOTE — ED Notes (Signed)
Mother left.  EMS brought in pts cell phone and home medications. These items secured in pt belonging bag. Medications include 2 bottles of sevelamer carb 800mg  2 bottles of levetiracetam 500mg  1 bottle of zofran 4mg  2 bottle of cinacalcet HCL 30mg  1 bottle of calcitriol oral capsules 0.5mg 

## 2020-05-07 NOTE — ED Notes (Signed)
Dialysis catheter site cleaned with chlorhexidine, and gauze dressing with tape secured, as pt is not being cooperative and will not lay on back.

## 2020-05-07 NOTE — ED Notes (Signed)
Pt is refusing the nurse tech to have monitor on or get vitals.

## 2020-05-07 NOTE — ED Notes (Signed)
Pt awake requesting nausea medicine; pt states it's too hot. Pt took blankets off d/t being "hot"; this tech checked temp (97.8) at this time. Pt covered with 1 blanket and is sleeping at this time.

## 2020-05-07 NOTE — Consult Note (Signed)
NEUROLOGY CONSULTATION NOTE   Date of service: May 07, 2020 Patient Name: Patricia Maldonado MRN:  [id number] DOB:  1981-05-19 Reason for consult: "Seizures"  History of Present Illness  Patricia Maldonado is a 39 y.o. 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  She was seen by our team back in March 2021 for recurrent seizures and it was unclear at that time the seizures were provoked. Work-up with MRI brain on March 12th 2021 which was negative for any acute abnormality.  She had a repeat MRI on December 23, 2019 which demonstrated symmetric cortical edema in the occipital, parietal, frontal lobes and cerebellum bilaterally and was thought to be compatible with PRES given her history of dialysis and severe hypertension.  EEG on December 21, 2019 with no epileptiform discharges.  She was started on Keppra 500 mg daily evening, dose adjusted for her ESRD.  She was seen by neurology outpatient on March 10, 2020 and continued on Keppra 500 mg twice a day with additional 1 tablet 500 mg after dialysis.  She presented to the ED on 05/06/2020 with a brief seizure lasting 30 to 60 seconds which was witnessed by EMS.  EMS stated that they were called after patient had been feeling malaise throughout the day with nausea and headache.  Patient had missed her dialysis appointment it was unclear if patient had been taking her Keppra as prescribed.  Patient is somnolent, opens eyes and answers some questions and goes right back to sleep. She does not recall any details of the event that brought her to the hospital. She does note remember anything about a seizure. Does endorse being tired over the last couple of days. Tells me that she takes her keppra on time and as prescribed and takes 3 pills on the day of dialysis and 2 on other days. Yesterday, she only took one. Seems like she got her second dose of Keppra last night in the ED.  Attempted to contact  patient's listed contact, Ms. Calvina Liptak on the listed phone number to get some colateral but no one answered and the voice mail box is full.  ROS   Somnolence makes it impossible to get a detailed ROS. She denies any headache, no blurred vision, no pain.  Past History   Past Medical History:  Diagnosis Date  . Acute renal failure (ARF) ([place])   . Anemia   . Arthritis   . History of kidney stones   . Hypertension    OFF BP MEDS X 3 WEEKS DUE TO BP CONTROL  . Lupus nephritis ([place]) 12/2016   DX DURING HOSPITAL ADMISSION  . Rash   . Staghorn calculus   . 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[place]) 10/2016   DX DURING HOSPITAL ADMISSION   Past Surgical History:  Procedure Laterality Date  . CYSTOSCOPY/URETEROSCOPY/HOLMIUM LASER/STENT PLACEMENT Right 02/21/2017   Procedure: CYSTOSCOPY/URETEROSCOPY/HOLMIUM LASER/STENT EXCHANGE;  Surgeon: [name], MD;  Location: [facility] ORS;  Service: Urology;  Laterality: Right;  . DIALYSIS/PERMA CATHETER INSERTION N/A 11/30/2018   Procedure: DIALYSIS/PERMA CATHETER INSERTION;  Surgeon: Algernon Huxley, MD;  Location: [place] [facility];  Service: Cardiovascular;  Laterality: N/A;  . IR NEPHROSTOMY PLACEMENT RIGHT  01/23/2017  . NEPHROLITHOTOMY Right 01/24/2017   Procedure: NEPHROLITHOTOMY PERCUTANEOUS;  Surgeon: [name], MD;  Location: [facility] OR[facility];  Service: Urology;  Laterality: Right;  . PERITONEAL CATHETER INSERTION    . TUBAL LIGATION     Family History  Problem  Relation Age of Onset  . Thyroid disease Mother   . Thyroid disease Father   . Diabetes Father    Social History   Socioeconomic History  . Marital status: Single    Spouse name: Not on file  . Number of children: Not on file  . Years of education: Not on file  . Highest education level: Not on file  Occupational History  . Not on file  Tobacco Use  . Smoking status: Current Every Day Smoker    Packs/day: 0.25    Years: 15.00    Pack years: 3.75    Types: Cigarettes  . Smokeless tobacco:  Never Used  Vaping Use  . Vaping Use: Never used  Substance and Sexual Activity  . Alcohol use: No  . Drug use: Yes    Types: Marijuana    Comment: OCC  . Sexual activity: Yes  Other Topics Concern  . Not on file  Social History Narrative  . Not on file   Social Determinants of Health   Financial Resource Strain:   . Difficulty of Paying Living Expenses:   Food Insecurity:   . Worried About Charity fundraiser in the Last Year:   . Arboriculturist in the Last Year:   Transportation Needs:   . Film/video editor (Medical):   Marland Kitchen Lack of Transportation (Non-Medical):   Physical Activity:   . Days of Exercise per Week:   . Minutes of Exercise per Session:   Stress:   . Feeling of Stress :   Social Connections:   . Frequency of Communication with Friends and Family:   . Frequency of Social Gatherings with Friends and Family:   . Attends Religious Services:   . Active Member of Clubs or Organizations:   . Attends Archivist Meetings:   Marland Kitchen Marital Status:    Allergies  Allergen Reactions  . Penicillins Swelling    Yeast infection Has patient had a PCN reaction causing immediate rash, facial/tongue/throat swelling, SOB or lightheadedness with hypotension:No Has patient had a PCN reaction causing severe rash involving mucus membranes or skin necrosis: Yes Has patient had a PCN reaction that required hospitalization No Has patient had a PCN reaction occurring within the last 10 years: No If all of the above answers are "NO", then may proceed with Cephalosporin use.  Yeast infection Has patient had a PCN reaction causing immediate rash, facial/tongue/throat swelling, SOB or lightheadedness with hypotension:No Has patient had a PCN reaction causing severe rash involving mucus membranes or skin necrosis: Yes Has patient had a PCN reaction that required hospitalization No Has patient had a PCN reaction occurring within the last 10 years: No If all of the above  answers are "NO", then may proceed with Cephalosporin use.    Medications    Current Outpatient Medications  Medication Sig Dispense Refill Last Dose  . calcitRIOL (ROCALTROL) 0.5 MCG capsule 2 Capsule(s) By Mouth Daily   Past Week at Unknown time  . carvedilol (COREG) 12.5 MG tablet tablet Take 3 tablets (37.5 mg total) by mouth two (2) times a day   Past Week at Unknown time  . cinacalcet (SENSIPAR) 30 MG tablet Take 30 mg by mouth daily.   Past Week at Unknown time  . citalopram (CELEXA) 10 MG tablet Take 10 mg by mouth daily.   Past Week at Unknown time  . gentamicin cream (GARAMYCIN) 0.1 % APPLY TO EXIT SITE DAILY   Past Week at Unknown time  .  hydroxychloroquine (PLAQUENIL) 200 MG tablet    Past Week at Unknown time  . hydroxyurea (DROXIA) 200 MG capsule Take 200 mg by mouth daily.   Past Week at Unknown time  . levETIRAcetam (KEPPRA) 500 MG tablet TAKE 1 TABLET BY MOUTH TWICE DAILY WITH ADDITIONAL 1 TABLET AFTER DIALYSIS   Past Week at Unknown time  . losartan (COZAAR) 50 MG tablet Take 50 mg by mouth daily.   Past Week at Unknown time  . mycophenolate (CELLCEPT) 500 MG tablet Take 2 tablets (1,000 mg total) by mouth 2 (two) times daily. 120 tablet 0 Past Week at Unknown time  . NIFEdipine (PROCARDIA XL/NIFEDICAL-XL) 90 MG 24 hr tablet Take 90 mg by mouth daily.   Past Week at Unknown time  . ondansetron (ZOFRAN) 4 MG tablet Take 4 mg by mouth 2 (two) times daily as needed.   Past Week at PRN  . sevelamer (RENAGEL) 800 MG tablet Take by mouth 3 (three) times daily with meals.   Past Week at Unknown time  . valsartan (DIOVAN) 160 MG tablet Take 160 mg by mouth daily.   Past Week at Unknown time      Vitals  Temp:  [97.5 F (36.4 C)] 97.5 F (36.4 C) (08/04 2235) Pulse Rate:  [67-112] 95 (08/05 0815) Resp:  [11-28] 19 (08/05 0815) BP: (123-226)/(87-138) 148/116 (08/05 0815) SpO2:  [97 %-100 %] 99 % (08/05 0815)  There is no height or weight on file to calculate BMI.  Physical  Exam   General: Laying in bed; in no acute distress.  HENT: Normal oropharynx and mucosa. Normal external appearance of ears and nose. Neck: Supple, no pain or tenderness CV: No JVD. No peripheral edema. Pulmonary: Symmetric Chest rise. Normal respiratory effort. Abdomen: Soft to touch, non-tender Ext: No cyanosis, edema, or deformity  Skin: Rash on her legs. Normal palpation of skin. Musculoskeletal: Normal digits and nails by inspection. No clubbing.  Neurologic Examination  Mental status/Cognition: somnolent and drowsy, oriented to self, place, month and year, poor attention. Speech/language: non fluent, comprehension intact to very simple questions. Mentation precludes further assessment of speech and language.  Cranial nerves:   CN II Pupils equal and reactive to light, unable to assess for visual field deficits but does blink to threat.   CN III,IV,VI Looks left and right, no gaze preference or deviation, no nystagmus   CN V normal sensation in V1, V2, and V3 segments bilaterally   CN VII no asymmetry, no nasolabial fold flattening   CN VIII normal hearing to speech   CN IX & X normal palatal elevation, no uvular deviation   CN XI    CN XII midline tongue protrusion   Motor:  Muscle bulk: poor, tone normal. Unable to do a detailed assessment but does squeeze my fingers with her hands with atleast 4+ strength BL and lifts both legs off the bed. Mvmt Root Nerve  Muscle Right Left Comments  SA C5/6 Ax Deltoid     EF C5/6 Mc Biceps     EE C6/7/8 Rad Triceps     WF C6/7 Med FCR     WE C7/8 PIN ECU     F Ab C8/T1 U ADM/FDI     HF L1/2/3 Fem Illopsoas     KE L2/3/4 Fem Quad     DF L4/5 D Peron Tib Ant     PF S1/2 Tibial Grc/Sol      Reflexes:  Right Left Comments  Pectoralis  Biceps (C5/6)     Brachioradialis (C5/6) 2 2    Triceps (C6/7) 1 1    Patellar (L3/4) 1 1    Achilles (S1) 0 0    Hoffman      Plantar mute mute   Jaw jerk    Sensation:  Light touch  Intact in all extremities.   Pin prick    Temperature    Vibration   Proprioception    Coordination/Complex Motor:  - Can touch her nose with no noticeable dysmetria. - Rapid alternating movement unable to assess - Gait: not safe.  Labs   Lab Results  Component Value Date   NA 140 05/07/2020   K 4.2 05/07/2020   CL 111 05/07/2020   CO2 14 (L) 05/07/2020   GLUCOSE 90 05/07/2020   BUN 60 (H) 05/07/2020   CREATININE 12.27 (H) 05/07/2020   CALCIUM 8.4 (L) 05/07/2020   ALBUMIN 3.8 05/06/2020   AST 26 05/06/2020   ALT 10 05/06/2020   ALKPHOS 57 05/06/2020   BILITOT 1.0 05/06/2020   GFRNONAA 3 (L) 05/07/2020   GFRAA 4 (L) 05/07/2020   Imaging and Diagnostic studies   CTH without contrast: No acute intracranial pathology.  Impression   Lucette S Victorian is a 39 y.o. 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 Vitals with SBP elevated to 200+ on presentation and concern for PRES. Exam with no focal deficit, she is somnolent which might be due to multiple medications due to her agitation in the ED or post ictal. She does not appear to be actively seizing right now.  My suspicion is that given her prior hx of PRES, this is likely a provoked seizure probably due to PRES again given her SBP at presentation. I will give her a loading dose of Fosphenytoin but hold off on starting any maintenance antiepileptic medications at this time. Given her hx of SLE, another consideration is Neuropsychiatric SLE. We will start with an MRI without contrast for now and go from there and see if additional workup is needed.  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   Recommendations  - I ordered Fosphenytoin 15mg /kg once. - Continue Keppra 500mg  BID and an additional 500mg  of Keppra on days of dialysis to be given  immediately after Dialysis. (pharmacy to help with putting order for Keppra after dialysis) - Seizure precautions - I ordered Ativan 2mg  IV once PRN for any seizure like activity lasting more than 2 mins. - I ordered MRI Brain without contrast - holding off on contrast 2/2 ESRD. - Recommend gradual normotension. Please do not drop SBP by more than 25% ion a 24 hour period. - Neurology inpatient team will continue to follow along  ______________________________________________________________________   Thank you for the opportunity to take part in the care of this patient. If you have any further questions, please contact the neurology consultation team.  Signed,  [place] Pager Number [phone]

## 2020-05-07 NOTE — ED Notes (Signed)
Pt taken for MRI.

## 2020-05-07 NOTE — ED Notes (Signed)
Incertion site of dialysis catheter to the right subclavian was cleaned with chlorhexidine a petroleum gauze placed around port and secured with a Tegaderm. Sitter at bedside.

## 2020-05-08 ENCOUNTER — Other Ambulatory Visit: Payer: Self-pay

## 2020-05-08 ENCOUNTER — Inpatient Hospital Stay: Payer: Medicare Other

## 2020-05-08 DIAGNOSIS — H538 Other visual disturbances: Secondary | ICD-10-CM

## 2020-05-08 LAB — BASIC METABOLIC PANEL
Anion gap: 16 — ABNORMAL HIGH (ref 5–15)
BUN: 73 mg/dL — ABNORMAL HIGH (ref 6–20)
CO2: 19 mmol/L — ABNORMAL LOW (ref 22–32)
Calcium: 8.6 mg/dL — ABNORMAL LOW (ref 8.9–10.3)
Chloride: 102 mmol/L (ref 98–111)
Creatinine, Ser: 14.2 mg/dL — ABNORMAL HIGH (ref 0.44–1.00)
GFR calc Af Amer: 3 mL/min — ABNORMAL LOW (ref >60)
GFR calc non Af Amer: 3 mL/min — ABNORMAL LOW (ref >60)
Glucose, Bld: 124 mg/dL — ABNORMAL HIGH (ref 70–99)
Potassium: 5.1 mmol/L (ref 3.5–5.1)
Sodium: 137 mmol/L (ref 135–145)

## 2020-05-08 LAB — MAGNESIUM: Magnesium: 2 mg/dL (ref 1.7–2.4)

## 2020-05-08 LAB — CBC
HCT: 27.3 % — ABNORMAL LOW (ref 36.0–46.0)
Hemoglobin: 9.3 g/dL — ABNORMAL LOW (ref 12.0–15.0)
MCH: 30.9 pg (ref 26.0–34.0)
MCHC: 34.1 g/dL (ref 30.0–36.0)
MCV: 90.7 fL (ref 80.0–100.0)
Platelets: 46 10*3/uL — ABNORMAL LOW (ref 150–400)
RBC: 3.01 MIL/uL — ABNORMAL LOW (ref 3.87–5.11)
RDW: 15.5 % (ref 11.5–15.5)
WBC: 8 10*3/uL (ref 4.0–10.5)
nRBC: 0 % (ref 0.0–0.2)

## 2020-05-08 LAB — PHOSPHORUS
Phosphorus: 7 mg/dL — ABNORMAL HIGH (ref 2.5–4.6)
Phosphorus: 7 mg/dL — ABNORMAL HIGH (ref 2.5–4.6)

## 2020-05-08 LAB — URINE CULTURE: Culture: <10000 — AB

## 2020-05-08 LAB — HEPATITIS B SURFACE ANTIBODY, QUANTITATIVE: Hep B S AB Quant (Post): >1000 m[IU]/mL (ref >9.9)

## 2020-05-08 MED ORDER — HYDRALAZINE HCL 20 MG/ML IJ SOLN: 10.0000 mg | Freq: Four times a day (QID) | INTRAMUSCULAR | Status: DC | PRN

## 2020-05-08 MED ORDER — PROCHLORPERAZINE EDISYLATE 10 MG/2ML IJ SOLN
10.0000 mg | Freq: Once | INTRAMUSCULAR | Status: AC
  Administered 2020-05-08: 10 mg via INTRAVENOUS
  Filled 2020-05-08: qty 2

## 2020-05-08 MED ORDER — ACETAMINOPHEN 325 MG PO TABS
650.0000 mg | ORAL_TABLET | Freq: Four times a day (QID) | ORAL | Status: DC | PRN
  Administered 2020-05-08: 650 mg via ORAL
  Filled 2020-05-08: qty 2

## 2020-05-08 MED ORDER — HEPARIN SODIUM (PORCINE) 1000 UNIT/ML DIALYSIS: 1000.0000 [IU] | INTRAMUSCULAR | Status: DC | PRN

## 2020-05-08 MED ORDER — RENA-VITE PO TABS
1.0000 | ORAL_TABLET | Freq: Every day | ORAL | Status: DC
  Administered 2020-05-08 – 2020-05-09 (×2): 1 via ORAL
  Filled 2020-05-08 (×3): qty 1

## 2020-05-08 MED ORDER — PENTAFLUOROPROP-TETRAFLUOROETH EX AERO
1.0000 "application " | INHALATION_SPRAY | CUTANEOUS | Status: DC | PRN
  Filled 2020-05-08: qty 30

## 2020-05-08 MED ORDER — MAGNESIUM SULFATE 2 GM/50ML IV SOLN
2.0000 g | Freq: Once | INTRAVENOUS | Status: AC
  Administered 2020-05-08: 2 g via INTRAVENOUS
  Filled 2020-05-08: qty 50

## 2020-05-08 MED ORDER — LIDOCAINE HCL (PF) 1 % IJ SOLN
5.0000 mL | INTRAMUSCULAR | Status: DC | PRN
  Filled 2020-05-08: qty 5

## 2020-05-08 MED ORDER — LOSARTAN POTASSIUM 50 MG PO TABS
50.0000 mg | ORAL_TABLET | Freq: Every day | ORAL | Status: DC
  Administered 2020-05-08 – 2020-05-10 (×3): 50 mg via ORAL
  Filled 2020-05-08 (×4): qty 1

## 2020-05-08 MED ORDER — DIPHENHYDRAMINE HCL 50 MG/ML IJ SOLN
25.0000 mg | Freq: Once | INTRAMUSCULAR | Status: AC
  Administered 2020-05-08: 25 mg via INTRAVENOUS
  Filled 2020-05-08: qty 1

## 2020-05-08 MED ORDER — LIDOCAINE-PRILOCAINE 2.5-2.5 % EX CREA
1.0000 "application " | TOPICAL_CREAM | CUTANEOUS | Status: DC | PRN
  Filled 2020-05-08: qty 5

## 2020-05-08 MED ORDER — ALTEPLASE 2 MG IJ SOLR: 2.0000 mg | Freq: Once | INTRAMUSCULAR | Status: DC | PRN

## 2020-05-08 MED ORDER — SODIUM CHLORIDE 0.9 % IV SOLN: 100.0000 mL | INTRAVENOUS | Status: DC | PRN

## 2020-05-08 NOTE — Progress Notes (Signed)
Dr Holley Raring notified of patients restlessness, decreased brr to 363ml/min, uf off for now,  Considered ending this treatment for patient safety but after patient has calmed down. BS 86.  Resting quietly again.

## 2020-05-08 NOTE — Progress Notes (Signed)
NEUROLOGY CONSULTATION PROGRESS NOTE   Date of service: May 08, 2020 Patient Name: Patricia Maldonado MRN:  [id number] DOB:  1981/08/16  History of Present Illness  Patricia Maldonado is a 39 y.o. 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 Vitals with SBP elevated to 200+ on presentation and concern for PRES. MRI Brain is supportive of a diagnosis of PRES at this time with multiple areas of subcortical hyperintense T2-weighted signal  within the posterior parietal lobes, left greater than right occipital lobes and cerebellum.   Interval Hx  Patient is awake, alert and conversant. Has a headache today 9/10 after dialysis. Mom at bedside states that she missed a couple sessions of dialysis and was very lethargic afterwards.  Reports that she has been having some headache with dialysis that has been making it difficult to tolerate dialysis.  Past History   Past Medical History:  Diagnosis Date  . Acute renal failure (ARF) ([place])   . Anemia   . Arthritis   . History of kidney stones   . Hypertension    OFF BP MEDS X 3 WEEKS DUE TO BP CONTROL  . Lupus nephritis ([place]) 12/2016   DX DURING HOSPITAL ADMISSION  . Rash   . Staghorn calculus   . 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[place]) 10/2016   DX DURING HOSPITAL ADMISSION   Past Surgical History:  Procedure Laterality Date  . CYSTOSCOPY/URETEROSCOPY/HOLMIUM LASER/STENT PLACEMENT Right 02/21/2017   Procedure: CYSTOSCOPY/URETEROSCOPY/HOLMIUM LASER/STENT EXCHANGE;  Surgeon: [name], MD;  Location: [facility];  Service: Urology;  Laterality: Right;  . DIALYSIS/PERMA CATHETER INSERTION N/A 11/30/2018   Procedure: DIALYSIS/PERMA CATHETER INSERTION;  Surgeon: Algernon Huxley, MD;  Location: [facility];  Service: Cardiovascular;  Laterality: N/A;  . IR NEPHROSTOMY PLACEMENT RIGHT  01/23/2017  . NEPHROLITHOTOMY Right 01/24/2017   Procedure:  NEPHROLITHOTOMY PERCUTANEOUS;  Surgeon: [name], MD;  Location: [facility];  Service: Urology;  Laterality: Right;  . PERITONEAL CATHETER INSERTION    . TUBAL LIGATION     Family History  Problem Relation Age of Onset  . Thyroid disease Mother   . Thyroid disease Father   . Diabetes Father    Social History   Socioeconomic History  . Marital status: Single    Spouse name: Not on file  . Number of children: Not on file  . Years of education: Not on file  . Highest education level: Not on file  Occupational History  . Not on file  Tobacco Use  . Smoking status: Current Every Day Smoker    Packs/day: 0.25    Years: 15.00    Pack years: 3.75    Types: Cigarettes  . Smokeless tobacco: Never Used  Vaping Use  . Vaping Use: Never used  Substance and Sexual Activity  . Alcohol use: No  . Drug use: Yes    Types: Marijuana    Comment: OCC  . Sexual activity: Yes  Other Topics Concern  . Not on file  Social History Narrative  . Not on file   Social Determinants of Health   Financial Resource Strain:   . Difficulty of Paying Living Expenses:   Food Insecurity:   . Worried About Charity fundraiser in the Last Year:   . Arboriculturist in the Last Year:   Transportation Needs:   . Film/video editor (Medical):   Marland Kitchen Lack of Transportation (  Non-Medical):   Physical Activity:   . Days of Exercise per Week:   . Minutes of Exercise per Session:   Stress:   . Feeling of Stress :   Social Connections:   . Frequency of Communication with Friends and Family:   . Frequency of Social Gatherings with Friends and Family:   . Attends Religious Services:   . Active Member of Clubs or Organizations:   . Attends Archivist Meetings:   Marland Kitchen Marital Status:    Allergies  Allergen Reactions  . Penicillins Swelling    Yeast infection Has patient had a PCN reaction causing immediate rash, facial/tongue/throat swelling, SOB or lightheadedness with hypotension:No Has  patient had a PCN reaction causing severe rash involving mucus membranes or skin necrosis: Yes Has patient had a PCN reaction that required hospitalization No Has patient had a PCN reaction occurring within the last 10 years: No If all of the above answers are "NO", then may proceed with Cephalosporin use.  Yeast infection Has patient had a PCN reaction causing immediate rash, facial/tongue/throat swelling, SOB or lightheadedness with hypotension:No Has patient had a PCN reaction causing severe rash involving mucus membranes or skin necrosis: Yes Has patient had a PCN reaction that required hospitalization No Has patient had a PCN reaction occurring within the last 10 years: No If all of the above answers are "NO", then may proceed with Cephalosporin use.    Medications   Medications Prior to Admission  Medication Sig Dispense Refill Last Dose  . calcitRIOL (ROCALTROL) 0.5 MCG capsule 2 Capsule(s) By Mouth Daily   Past Week at Unknown time  . carvedilol (COREG) 12.5 MG tablet tablet Take 3 tablets (37.5 mg total) by mouth two (2) times a day   Past Week at Unknown time  . cinacalcet (SENSIPAR) 30 MG tablet Take 30 mg by mouth daily.   Past Week at Unknown time  . citalopram (CELEXA) 10 MG tablet Take 10 mg by mouth daily.   Past Week at Unknown time  . gentamicin cream (GARAMYCIN) 0.1 % APPLY TO EXIT SITE DAILY   Past Week at Unknown time  . hydroxychloroquine (PLAQUENIL) 200 MG tablet    Past Week at Unknown time  . hydroxyurea (DROXIA) 200 MG capsule Take 200 mg by mouth daily.   Past Week at Unknown time  . levETIRAcetam (KEPPRA) 500 MG tablet TAKE 1 TABLET BY MOUTH TWICE DAILY WITH ADDITIONAL 1 TABLET AFTER DIALYSIS   Past Week at Unknown time  . losartan (COZAAR) 50 MG tablet Take 50 mg by mouth daily.   Past Week at Unknown time  . mycophenolate (CELLCEPT) 500 MG tablet Take 2 tablets (1,000 mg total) by mouth 2 (two) times daily. 120 tablet 0 Past Week at Unknown time  . NIFEdipine  (PROCARDIA XL/NIFEDICAL-XL) 90 MG 24 hr tablet Take 90 mg by mouth daily.   Past Week at Unknown time  . ondansetron (ZOFRAN) 4 MG tablet Take 4 mg by mouth 2 (two) times daily as needed.   Past Week at PRN  . sevelamer (RENAGEL) 800 MG tablet Take by mouth 3 (three) times daily with meals.   Past Week at Unknown time  . valsartan (DIOVAN) 160 MG tablet Take 160 mg by mouth daily.   Past Week at Unknown time     Vitals  Temp:  [98.6 F (37 C)-99.3 F (37.4 C)] 99.1 F (37.3 C) (08/06 1415) Pulse Rate:  [99-118] 117 (08/06 1447) Resp:  [12-31] 18 (08/06 1447) BP: (  131-159)/(91-124) 131/95 (08/06 1447) SpO2:  [98 %-100 %] 100 % (08/06 1447) Weight:  [45 kg-45.1 kg] 45 kg (08/06 0607)  Body mass index is 18.15 kg/m.  Physical Exam   General: Laying comfortably in bed; in no acute distress.  HENT: Normal oropharynx and mucosa. Normal external appearance of ears and nose. Neck: Supple, no pain or tenderness CV: No JVD. No peripheral edema. Pulmonary: Symmetric Chest rise. Normal respiratory effort. Abdomen: Soft to touch, non-tender Ext: No cyanosis, edema, or deformity  Skin: No rash. Normal palpation of skin.   Musculoskeletal: Normal digits and nails by inspection. No clubbing.  Neurologic Examination  Mental status/Cognition: Alert, oriented to self, place, month and year, good attention. Speech/language: Fluent, comprehension intact, object naming intact, repetition intact. Cranial nerves:   CN II Pupils equal and reactive to light, no VF deficits   CN III,IV,VI EOM intact, no gaze preference or deviation, no nystagmus   CN V    CN VII no asymmetry, no nasolabial fold flattening    CN VIII normal hearing to speech   CN IX & X    CN XI    CN XII midline tongue protrusion   Motor:  Muscle bulk: decreased , tone normal Moves all extremities spontaneously and antigravity.    Labs   Lab Results  Component Value Date   NA 137 05/08/2020   K 5.1 05/08/2020   CL 102  05/08/2020   CO2 19 (L) 05/08/2020   GLUCOSE 124 (H) 05/08/2020   BUN 73 (H) 05/08/2020   CREATININE 14.20 (H) 05/08/2020   CALCIUM 8.6 (L) 05/08/2020   ALBUMIN 3.8 05/06/2020   AST 26 05/06/2020   ALT 10 05/06/2020   ALKPHOS 57 05/06/2020   BILITOT 1.0 05/06/2020   GFRNONAA 3 (L) 05/08/2020   GFRAA 3 (L) 05/08/2020     Imaging and Diagnostic studies  MRI Brain without contrast:  1. Multiple areas of subcortical hyperintense T2-weighted signal within the posterior parietal lobes, left greater than right occipital lobes and cerebellum. 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 The extent of supratentorial involvement is decreased relative to the prior study. 2. No acute ischemia or hemorrhage.   Impression   Patricia Maldonado is a 39 y.o. 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 Vitals with SBP elevated to 200+ on presentation and concern for PRES. MRI Brain is consistent with PRES. Likely she had a provoked seizure in the setting of PRES.  The timeline of her symptoms with missing dialysis x couple session, followed by lethargy and then a seizure is more consistent with PRES than Neuropsychiatric SLE.  Discussed with patient and mom at bedside the improtance of dialysis and blood pressure control to prevent further episodes of PRES.  Recommendations  - Continue Keppra 500mg  BID - I gave a 1 time headache cocktail with Benaryl, Compazine, Magnesium IV - Follow up with outpatient Neurology. ______________________________________________________________________   Thank you for the opportunity to take part in the care of this patient. If you have any further questions, please contact the neurology consultation attending.  Signed,  [place] Pager Number [phone]

## 2020-05-08 NOTE — ED Notes (Signed)
Pt taken to floor with another Retail banker.

## 2020-05-08 NOTE — Procedures (Signed)
Pt in dialysis eeg delayed again

## 2020-05-08 NOTE — ED Notes (Signed)
RN attempted to call report to the nurse who is taking room 247, that pt is supposed to go to. Nurse unavailable at this time to get report.

## 2020-05-08 NOTE — TOC Initial Note (Signed)
Transition of Care [facility]) - Initial/Assessment Note    Patient Details  Name: Patricia Maldonado MRN: [id number] Date of Birth: [birth year]  Transition of [facility] CM/SW Contact:    Meriel Flavors, LCSW Phone Number: 05/08/2020, 3:43 PM  Clinical Narrative:                 Patient is currently requiring intense sedation for behavior control. CSW will re-visit patient at another time         Patient Goals and CMS Choice        Expected Discharge Plan and Services                                                Prior Living Arrangements/Services                       Activities of Daily Living Home Assistive Devices/Equipment: None ADL Screening (condition at time of admission) Patient's cognitive ability adequate to safely complete daily activities?: Yes Is the patient deaf or have difficulty hearing?: No Does the patient have difficulty seeing, even when wearing glasses/contacts?: No Does the patient have difficulty concentrating, remembering, or making decisions?: Yes Patient able to express need for assistance with ADLs?: Yes Does the patient have difficulty dressing or bathing?: Yes Independently performs ADLs?: Yes (appropriate for developmental age) Does the patient have difficulty walking or climbing stairs?: Yes Weakness of Legs: Both Weakness of Arms/Hands: Both  Permission Sought/Granted                  Emotional Assessment              Admission diagnosis:  Seizure ([place]) [R56.9] Seizures ([place]) [R56.9] ESRD on dialysis ([place]) [N18.6, Z99.2] PRES (posterior reversible encephalopathy syndrome) [I67.83] Patient Active Problem List   Diagnosis Date Noted  . Blurred vision, bilateral 05/08/2020  . Seizures ([place]) 05/06/2020  . Cerebral edema ([place]) 12/23/2019  . Seizure disorder ([place]) 12/20/2019  . Acute repetitive seizure ([place]) 12/19/2019  . History of Closed sacral fracture ([place]) 12/19/2019  . Thrombocytopenia ([place])  12/19/2019  . Anemia, unspecified 04/28/2019  . ESRD on hemodialysis ([place]) 02/21/2019  . Essential hypertension 02/21/2019  . CKD (chronic kidney disease), stage V ([place]) 09/12/2018  . Metabolic acidosis 85/46/2703  . Secondary hyperparathyroidism ([place]) 09/12/2018  . Acute on chronic renal failure ([place]) 04/26/2018  . Acute kidney injury ([place]) 07/27/2017  . Alopecia of scalp 03/08/2017  . Thyroid nodule 03/08/2017  . PE (pulmonary thromboembolism) ([place]) 03/01/2017  . Kidney stone on left side 01/23/2017  . Systemic lupus erythematosus ([place]) 12/08/2016  . Encounter for long-term (current) use of high-risk medication 12/08/2016  . 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 Lupus nephritis ([place]) 11/07/2016  . Acute renal failure (ARF) ([place]) 10/04/2016  . Nephrolithiasis   . Staghorn calculus   . Acute renal failure with tubular necrosis (H[facility]) 08/30/2016   PCP:  Letta Median, MD Pharmacy:   [id number] [address], [place] [address] Phone: [phone] Fax: [phone]  [place], [place] [address] [place] [address] Phone: [phone] Fax: [phone]  [id number] [place], [place] - [address] [place] [address] Phone: [phone] Fax: [phone]  [facility]  [place], [place] [address] Phone: [phone] Fax: [phone]     Social Determinants of Health (SDOH) Interventions    Readmission Risk Interventions Readmission Risk Prevention Plan 05/08/2020  Transportation Screening Complete  Medication Review ([place]) Complete  PCP or Specialist appointment within 3-5 days of discharge Complete  Palliative Care Screening Not [place] Not Applicable  Some recent data might be hidden

## 2020-05-08 NOTE — Progress Notes (Signed)
Pt taken to dialysis with sitter.

## 2020-05-08 NOTE — Progress Notes (Signed)
[facility]  ROUNDING NOTE   Subjective:  Patient a bit more awake and alert today. Dialysis had to be cut short yesterday as the patient was moving a lot in the gurney. Blood pressure currently 152/116.    Objective:  Vital signs in last 24 hours:  Temp:  [98.6 F (37 C)-99.3 F (37.4 C)] 98.9 F (37.2 C) (08/06 1130) Pulse Rate:  [84-118] 108 (08/06 1245) Resp:  [12-22] 16 (08/06 1245) BP: (138-176)/(84-129) 152/116 (08/06 1245) SpO2:  [98 %-100 %] 100 % (08/06 1130) Weight:  [45 kg-45.1 kg] 45 kg (08/06 0607)  Weight change:  Filed Weights   05/08/20 0122 05/08/20 0607  Weight: 45.1 kg 45 kg    Intake/Output: I/O last 3 completed shifts: In: 511.5 [I.V.:161.5; IV Piggyback:350] Out: 0277 [Urine:1; Other:1510]   Intake/Output this shift:  Total I/O In: 120 [P.O.:120] Out: -   Physical Exam: General: No acute distress  Head: Normocephalic, atraumatic. Moist oral mucosal membranes  Eyes: Anicteric  Neck: Supple  Lungs:  Clear to auscultation, normal effort  Heart: S1S2 no rubs  Abdomen:  Soft, nontender, bowel sounds present  Extremities: Trace peripheral edema.  Neurologic: Awake, alert, following commands  Skin: No lesions  Access: IJ PermCath    Basic Metabolic Panel: Recent Labs  Lab 05/06/20 2245 05/07/20 0607 05/08/20 0527  NA 141 140 137  K 4.5 4.2 5.1  CL 105 111 102  CO2 19* 14* 19*  GLUCOSE 104* 90 124*  BUN 54* 60* 73*  CREATININE 13.49* 12.27* 14.20*  CALCIUM 9.6 8.4* 8.6*  MG  --  2.0 2.0  PHOS  --  4.7* 7.0*    Liver Function Tests: Recent Labs  Lab 05/06/20 2245  AST 26  ALT 10  ALKPHOS 57  BILITOT 1.0  PROT 7.6  ALBUMIN 3.8   No results for input(s): LIPASE, AMYLASE in the last 168 hours. No results for input(s): AMMONIA in the last 168 hours.  CBC: Recent Labs  Lab 05/06/20 0023 05/06/20 2245 05/07/20 0344 05/08/20 0527  WBC 10.6* 7.3 8.4 8.0  NEUTROABS  --  6.4  --   --   HGB 12.6 11.8* 12.6  9.3*  HCT 40.3 37.2 39.9 27.3*  MCV 98.1 96.9 95.7 90.7  PLT 51* 62* 45* 46*    Cardiac Enzymes: No results for input(s): CKTOTAL, CKMB, CKMBINDEX, TROPONINI in the last 168 hours.  BNP: Invalid input(s): POCBNP  CBG: No results for input(s): GLUCAP in the last 168 hours.  Microbiology: Results for orders placed or performed during the hospital encounter of 05/06/20  SARS Coronavirus 2 by RT PCR (hospital order, performed in [facility] hospital lab) Nasopharyngeal Nasopharyngeal Swab     Status: None   Collection Time: 05/06/20 10:45 PM   Specimen: Nasopharyngeal Swab  Result Value Ref Range Status   SARS Coronavirus 2 NEGATIVE NEGATIVE Final    Comment: (NOTE) SARS-CoV-2 target nucleic acids are NOT DETECTED.  The SARS-CoV-2 RNA is generally detectable in upper and lower respiratory specimens during the acute phase of infection. The lowest concentration of SARS-CoV-2 viral copies this assay can detect is 250 copies / mL. A negative result does not preclude SARS-CoV-2 infection and should not be used as the sole basis for treatment or other patient management decisions.  A negative result may occur with improper specimen collection / handling, submission of specimen other than nasopharyngeal swab, presence of viral mutation(s) within the areas targeted by this assay, and inadequate number of viral copies (<250  copies / mL). A negative result must be combined with clinical observations, patient history, and epidemiological information.  Fact Sheet for Patients:   StrictlyIdeas.no  Fact Sheet for Healthcare Providers: BankingDealers.co.za  This test is not yet approved or  cleared by the [place] FDA and has been authorized for detection and/or diagnosis of SARS-CoV-2 by FDA under an Emergency Use Authorization (EUA).  This EUA will remain in effect (meaning this test can be used) for the duration of the COVID-19  declaration under Section 564(b)(1) of the Act, 21 U.S.C. section 360bbb-3(b)(1), unless the authorization is terminated or revoked sooner.  Performed at [facility], [address] [place], [place] [address]   Culture, Urine     Status: Abnormal   Collection Time: 05/07/20  3:44 AM   Specimen: Urine, Random  Result Value Ref Range Status   Specimen Description   Final    URINE, RANDOM Performed at [facility], [address] [place], [place] [address]    Special Requests   Final    NONE Performed at [facility], [address] [place], [place] [address]    Culture (A)  Final    <10,000 COLONIES/mL INSIGNIFICANT GROWTH Performed at [facility], [place] [address] [place],  [address]    Report Status 05/08/2020 FINAL  Final    Coagulation Studies: No results for input(s): LABPROT, INR in the last 72 hours.  Urinalysis: Recent Labs    05/07/20 0344  COLORURINE YELLOW*  LABSPEC 1.012  PHURINE 8.0  GLUCOSEU NEGATIVE  HGBUR LARGE*  BILIRUBINUR NEGATIVE  KETONESUR NEGATIVE  PROTEINUR >=300*  NITRITE NEGATIVE  LEUKOCYTESUR MODERATE*      Imaging: CT Head Wo Contrast  Result Date: 05/06/2020 CLINICAL DATA:  Seizure. EXAM: CT HEAD WITHOUT CONTRAST TECHNIQUE: Contiguous axial images were obtained from the base of the skull through the vertex without intravenous contrast. COMPARISON:  December 23, 2019 FINDINGS: Brain: No evidence of acute infarction, hemorrhage, hydrocephalus, extra-axial collection or mass lesion/mass effect. Vascular: No hyperdense vessel or unexpected calcification. Skull: Normal. Negative for fracture or focal lesion. Sinuses/Orbits: No acute finding. Other: None. IMPRESSION: No acute intracranial pathology. Electronically Signed   By: Virgina Norfolk M.D.   On: 05/06/2020 23:02   MR BRAIN WO CONTRAST  Result Date: 05/08/2020 CLINICAL DATA:  Encephalopathy.  Lupus nephritis. EXAM: MRI HEAD WITHOUT CONTRAST  TECHNIQUE: Multiplanar, multiecho pulse sequences of the brain and surrounding structures were obtained without intravenous contrast. COMPARISON:  12/23/2019 FINDINGS: Brain: No acute infarct, acute hemorrhage or extra-axial collection. There are multiple areas of subcortical hyperintense T2-weighted signal within posterior parietal lobes, the left greater than right occipital lobes and cerebellum. The supratentorial lesions are less numerous than on the prior study, but the cerebellar lesions are larger. Normal volume of CSF spaces. No chronic microhemorrhage. Normal midline structures. Vascular: Normal flow voids. Skull and upper cervical spine: Normal marrow signal. Sinuses/Orbits: Negative. Other: None. IMPRESSION: 1. Multiple areas of subcortical hyperintense T2-weighted signal within the posterior parietal lobes, left greater than right occipital lobes and cerebellum. 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 The extent of supratentorial involvement is decreased relative to the prior study. 2. No acute ischemia or hemorrhage. Electronically Signed   By: Ulyses Jarred M.D.   On: 05/08/2020 03:07   DG Chest Portable 1 View  Result Date: 05/06/2020 CLINICAL DATA:  Tunneled dialysis catheter placement. EXAM: PORTABLE CHEST 1 VIEW COMPARISON:  07/27/2017 FINDINGS: There is a right-sided dialysis catheter  with tip terminating over the right atrium. The heart size is stable but mildly enlarged. There is no pneumothorax. No significant pleural effusion. No focal infiltrate. No acute osseous abnormality. IMPRESSION: No active disease. Electronically Signed   By: Constance Holster M.D.   On: 05/06/2020 22:52     Medications:   . sodium chloride    . sodium chloride    . dexmedetomidine (PRECEDEX) IV infusion Stopped (05/07/20 0439)  . levETIRAcetam Stopped (05/07/20 2159)  . levETIRAcetam    . niCARDipine Stopped (05/07/20 0457)   . Chlorhexidine Gluconate Cloth  6  each Topical Q0600   sodium chloride, sodium chloride, alteplase, docusate sodium, heparin, lidocaine (PF), lidocaine-prilocaine, LORazepam, ondansetron (ZOFRAN) IV, pentafluoroprop-tetrafluoroeth, polyethylene glycol, promethazine  Assessment/ Plan:  39 y.o. female with past medical history of ESRD on HD MWF secondary to lupus nephritis, hypertension, seizure disorder, staghorn calculus, anemia of chronic kidney disease, secondary hyperparathyroidism, history of PRES who presented with seizure episode.  [facility] [address]/MWF/[facility]  1.  ESRD on HD MWF.  Patient missed dialysis treatment on Wednesday.  Therefore we attempted dialysis session yesterday however this had to be cut short as the patient was moving around a lot during dialysis treatment despite administration of Ativan 2 mg.  We will plan for another dialysis session today.  2.  Hypertension.  Patient now off nicardipine drip.  Add back losartan 50 mg daily today.  3.  Anemia of chronic kidney disease.  Hemoglobin was 12.6 but now 9.3.  Hold off on Epogen for now as the patient did present with seizure.  4.  Secondary hyperparathyroidism.  Phosphorus was 4.7 yesterday but now up to 7.0.  Likely secondary to missed dialysis.  Repeat serum phosphorus with next dialysis treatment.  LOS: 2 Korrey Schleicher 8/6/202112:58 PM

## 2020-05-08 NOTE — Progress Notes (Signed)
PROGRESS NOTE    MUREL WIGLE  WUJ:[id number] DOB: 07/28/1981 DOA: 05/06/2020 PCP: Letta Median, MD   Brief Narrative:  Patricia Maldonado is a 39 year old female with a past medical history significant for ESRD on HD (Monday, Wednesday, Friday), seizure disorder, hypertension, and lupus who presented to [facility] ED on 05/06/2020 following a witnessed seizure.  Patient is currently in the postictal state and sedated with no family present, therefore history is obtained from ED and nursing notes.  Per notes EMS was dispatched due to the patient feeling malaise throughout the day with headache and nausea, of which she missed her scheduled hemodialysis treatment today (reportedly received HD 2 days ago).  Shortly after the fire department arrived, she had a witnessed 30-60 second generalized tonic-clonic seizure.  She required 1 mg of Versed x2, but remained agitated during transport.  Upon arrival to the ED she remained confused, agitated, combative, and trying to pull out her Dialysis catheter.  She was noted to be markedly hypertensive with blood pressure 218/124.  She was given Ativan without improvement in agitation,  subsequently given IM Ketamine in order to facilitate medical treatment, and due to risk to herself and medical staff.  She was also given labetalol without improvement in BP, therefore was placed on Nicardipine drip.  Initial work-up in the ED revealed WBC 7.3, hemoglobin 11.8, platelets 62, high-sensitivity troponin 19, hCG normal, bicarb 19, BUN 54, creatinine 13.49.  CT head is negative for any acute intracranial abnormality.  Chest x-ray is negative.  Her COVID-19 PCR is negative.  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  Nephrology and Neurology have been consulted.  Assessment & Plan:   Principal Problem:    Seizures (HCC)/PRES: -seizure prophylaxis and precaution. -continue keppra. -continue with routine scheduled dialysis. -appreciate consult.     ESRD on hemodialysis [facility]): -Dialysis per nephrology, appreciate consult.  -d/w pt about renal diet and sodium restriction.     Essential hypertension: -Losartan started.  -renal /soidum, restricted diet.     Thrombocytopenia ([place]): -Pt has been anemic and thrombocytopenic since about 2017 on chart review. -We will request hematology consult.    Blurred vision, bilateral: -Pt reports blurred vision but describes floaters and may be due BP or uremia. -Have advised ophthalmology appt as outpatient.    DVT prophylaxis: Heparin Code Status: FULL. Family Communication:  None at bedside.  Disposition Plan:  Home.  Consultants:  Nephrology: Enis Slipper.  Neurology: Dr.Khaliqdina.  Subjective: Pt today was alert and awake and reported blurred vision her bp was high today. Pt has had Mri of brain which was negative. Neurology is following pt and we appreciate consult. Upon further questions pt reports floaters and headaches that have been going on since dialysis. Neurologist at  bedside and have d/w pt and mom in detail about results and headaches. D/W mom and patient that she has been anemic since 2017 she is receiving epo during dialysis which may cause headache but it is essential to find out what her anemia is from suspect ACD. Also advised eye exam for her floaters and headaches. Pt also to f/uw ith pcp for ongoing medical care.   Objective: Vitals:   05/08/20 1415 05/08/20 1447 05/08/20 1700 05/08/20 1742  BP: (!) 149/111 (!) 131/95  (!) 144/98  Pulse: (!) 103 (!) 117  (!) 115  Resp: (!) 25 18  16  Temp: 99.1 F (37.3 C)  99.5 F (37.5 C) 98 F (36.7 C)  TempSrc: Oral  Oral Oral  SpO2: 100% 100%  100%  Weight:      Height:        Intake/Output Summary (Last 24 hours) at 05/08/2020 1824 Last data filed at 05/08/2020 1700 Gross per  24 hour  Intake 120 ml  Output 556 ml  Net -436 ml   Filed Weights   05/08/20 0122 05/08/20 0607  Weight: 45.1 kg 45 kg    Examination: Blood pressure (!) 144/98, pulse (!) 115, temperature 98 F (36.7 C), temperature source Oral, resp. rate 16, height 5\' 2"  (1.575 m), weight 45 kg, SpO2 100 %.  General exam: Appears calm and comfortable  Respiratory system: Clear to auscultation. Respiratory effort normal. Cardiovascular system: S1 & S2 heard, RRR. No JVD, murmurs, rubs, gallops or clicks. No pedal edema. Gastrointestinal system: Abdomen is nondistended, soft and nontender. No organomegaly or masses felt. Normal bowel sounds heard. Central nervous system: Alert and oriented. No focal neurological deficits. Extremities: Symmetric 5 x 5 power. Skin: No rashes, lesions or ulcers Psychiatry: Judgement and insight appear normal. Mood & affect appropriate.   Data Reviewed: I have personally reviewed following labs and imaging studies  CBC: Recent Labs  Lab 05/06/20 0023 05/06/20 2245 05/07/20 0344 05/08/20 0527  WBC 10.6* 7.3 8.4 8.0  NEUTROABS  --  6.4  --   --   HGB 12.6 11.8* 12.6 9.3*  HCT 40.3 37.2 39.9 27.3*  MCV 98.1 96.9 95.7 90.7  PLT 51* 62* 45* 46*   Basic Metabolic Panel: Recent Labs  Lab 05/06/20 2245 05/07/20 0607 05/08/20 0527 05/08/20 0721  NA 141 140 137  --   K 4.5 4.2 5.1  --   CL 105 111 102  --   CO2 19* 14* 19*  --   GLUCOSE 104* 90 124*  --   BUN 54* 60* 73*  --   CREATININE 13.49* 12.27* 14.20*  --   CALCIUM 9.6 8.4* 8.6*  --   MG  --  2.0 2.0  --   PHOS  --  4.7* 7.0* 7.0*   GFR: Estimated Creatinine Clearance: 3.9 mL/min (A) (by C-G formula based on SCr of 14.2 mg/dL (H)). Liver Function Tests: Recent Labs  Lab 05/06/20 2245  AST 26  ALT 10  ALKPHOS 57  BILITOT 1.0  PROT 7.6  ALBUMIN 3.8     Recent Results (from the past 240 hour(s))  SARS Coronavirus 2 by RT PCR (hospital order, performed in [facility] hospital lab)  Nasopharyngeal Nasopharyngeal Swab     Status: None   Collection Time: 05/06/20 10:45 PM   Specimen: Nasopharyngeal Swab  Result Value Ref Range Status   SARS Coronavirus 2 NEGATIVE NEGATIVE Final    Comment: (NOTE) SARS-CoV-2 target nucleic acids are NOT DETECTED.  The SARS-CoV-2 RNA is generally detectable in upper and lower respiratory specimens during the acute phase of infection. The lowest concentration of SARS-CoV-2 viral copies this assay can detect is 250 copies / mL. A negative result does not preclude SARS-CoV-2 infection and should not be used as the sole basis for treatment or other patient management decisions.  A negative result may occur with improper specimen collection / handling, submission of specimen other than nasopharyngeal swab, presence of viral mutation(s) within the areas targeted by this assay, and inadequate number of viral copies (<250 copies / mL). A negative result must be combined with clinical observations, patient  history, and epidemiological information.  Fact Sheet for Patients:   StrictlyIdeas.no  Fact Sheet for Healthcare Providers: BankingDealers.co.za  This test is not yet approved or  cleared by the [place] FDA and has been authorized for detection and/or diagnosis of SARS-CoV-2 by FDA under an Emergency Use Authorization (EUA).  This EUA will remain in effect (meaning this test can be used) for the duration of the COVID-19 declaration under Section 564(b)(1) of the Act, 21 U.S.C. section 360bbb-3(b)(1), unless the authorization is terminated or revoked sooner.  Performed at [facility], [address] [place], [place] [address]   Culture, Urine     Status: Abnormal   Collection Time: 05/07/20  3:44 AM   Specimen: Urine, Random  Result Value Ref Range Status   Specimen Description   Final    URINE, RANDOM Performed at [facility], [address],  [place], [place] [address]    Special Requests   Final    NONE Performed at [facility], [address] [place], [place] [address]    Culture (A)  Final    <10,000 COLONIES/mL INSIGNIFICANT GROWTH Performed at [facility], [place] [address] [place], [place] [address]    Report Status 05/08/2020 FINAL  Final      Radiology Studies: CT Head Wo Contrast  Result Date: 05/06/2020 CLINICAL DATA:  Seizure. EXAM: CT HEAD WITHOUT CONTRAST TECHNIQUE: Contiguous axial images were obtained from the base of the skull through the vertex without intravenous contrast. COMPARISON:  December 23, 2019 FINDINGS: Brain: No evidence of acute infarction, hemorrhage, hydrocephalus, extra-axial collection or mass lesion/mass effect. Vascular: No hyperdense vessel or unexpected calcification. Skull: Normal. Negative for fracture or focal lesion. Sinuses/Orbits: No acute finding. Other: None. IMPRESSION: No acute intracranial pathology. Electronically Signed   By: Virgina Norfolk M.D.   On: 05/06/2020 23:02   MR BRAIN WO CONTRAST  Result Date: 05/08/2020 CLINICAL DATA:  Encephalopathy.  Lupus nephritis. EXAM: MRI HEAD WITHOUT CONTRAST TECHNIQUE: Multiplanar, multiecho pulse sequences of the brain and surrounding structures were obtained without intravenous contrast. COMPARISON:  12/23/2019 FINDINGS: Brain: No acute infarct, acute hemorrhage or extra-axial collection. There are multiple areas of subcortical hyperintense T2-weighted signal within posterior parietal lobes, the left greater than right occipital lobes and cerebellum. The supratentorial lesions are less numerous than on the prior study, but the cerebellar lesions are larger. Normal volume of CSF spaces. No chronic microhemorrhage. Normal midline structures. Vascular: Normal flow voids. Skull and upper cervical spine: Normal marrow signal. Sinuses/Orbits: Negative. Other: None. IMPRESSION: 1. Multiple areas of subcortical hyperintense T2-weighted signal  within the posterior parietal lobes, left greater than right occipital lobes and cerebellum. 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 The extent of supratentorial involvement is decreased relative to the prior study. 2. No acute ischemia or hemorrhage. Electronically Signed   By: Ulyses Jarred M.D.   On: 05/08/2020 03:07   DG Chest Portable 1 View  Result Date: 05/06/2020 CLINICAL DATA:  Tunneled dialysis catheter placement. EXAM: PORTABLE CHEST 1 VIEW COMPARISON:  07/27/2017 FINDINGS: There is a right-sided dialysis catheter with tip terminating over the right atrium. The heart size is stable but mildly enlarged. There is no pneumothorax. No significant pleural effusion. No focal infiltrate. No acute osseous abnormality. IMPRESSION: No active disease. Electronically Signed   By: Constance Holster M.D.   On: 05/06/2020 22:52     Scheduled Meds:  Chlorhexidine Gluconate Cloth  6 each Topical Q0600   losartan  50 mg Oral Daily   multivitamin  1 tablet Oral QHS   Continuous Infusions:  dexmedetomidine (PRECEDEX) IV infusion Stopped (05/07/20 0439)   levETIRAcetam 500 mg (05/08/20 1735)   levETIRAcetam     niCARDipine Stopped (05/07/20 0457)     LOS: 2 days    Para Skeans, MD [facility] Pager [phone] If 7PM-7AM, please contact night-coverage [email or web address] Password [facility] 05/08/2020, 6:24 PM

## 2020-05-09 LAB — CBC
HCT: 21.5 % — ABNORMAL LOW (ref 36.0–46.0)
Hemoglobin: 7.3 g/dL — ABNORMAL LOW (ref 12.0–15.0)
MCH: 31.2 pg (ref 26.0–34.0)
MCHC: 34 g/dL (ref 30.0–36.0)
MCV: 91.9 fL (ref 80.0–100.0)
Platelets: 35 10*3/uL — ABNORMAL LOW (ref 150–400)
RBC: 2.34 MIL/uL — ABNORMAL LOW (ref 3.87–5.11)
RDW: 15.6 % — ABNORMAL HIGH (ref 11.5–15.5)
WBC: 6.4 10*3/uL (ref 4.0–10.5)
nRBC: 0 % (ref 0.0–0.2)

## 2020-05-09 LAB — PARATHYROID HORMONE, INTACT (NO CA): PTH: 810 pg/mL — ABNORMAL HIGH (ref 15–65)

## 2020-05-09 LAB — BASIC METABOLIC PANEL
Anion gap: 12 (ref 5–15)
BUN: 34 mg/dL — ABNORMAL HIGH (ref 6–20)
CO2: 26 mmol/L (ref 22–32)
Calcium: 8.5 mg/dL — ABNORMAL LOW (ref 8.9–10.3)
Chloride: 99 mmol/L (ref 98–111)
Creatinine, Ser: 8.42 mg/dL — ABNORMAL HIGH (ref 0.44–1.00)
GFR calc Af Amer: 6 mL/min — ABNORMAL LOW (ref >60)
GFR calc non Af Amer: 5 mL/min — ABNORMAL LOW (ref >60)
Glucose, Bld: 78 mg/dL (ref 70–99)
Potassium: 4.6 mmol/L (ref 3.5–5.1)
Sodium: 137 mmol/L (ref 135–145)

## 2020-05-09 LAB — MAGNESIUM: Magnesium: 2.7 mg/dL — ABNORMAL HIGH (ref 1.7–2.4)

## 2020-05-09 LAB — PHOSPHORUS: Phosphorus: 4.3 mg/dL (ref 2.5–4.6)

## 2020-05-09 LAB — PREPARE RBC (CROSSMATCH)

## 2020-05-09 MED ORDER — CARVEDILOL 12.5 MG PO TABS
12.5000 mg | ORAL_TABLET | Freq: Two times a day (BID) | ORAL | Status: DC
  Administered 2020-05-09 – 2020-05-10 (×2): 12.5 mg via ORAL
  Filled 2020-05-09 (×2): qty 1

## 2020-05-09 MED ORDER — IRBESARTAN 150 MG PO TABS
150.0000 mg | ORAL_TABLET | Freq: Every day | ORAL | Status: DC
  Administered 2020-05-09 – 2020-05-10 (×2): 150 mg via ORAL
  Filled 2020-05-09 (×2): qty 1

## 2020-05-09 MED ORDER — SEVELAMER CARBONATE 800 MG PO TABS
1600.0000 mg | ORAL_TABLET | Freq: Three times a day (TID) | ORAL | Status: DC
  Administered 2020-05-09 – 2020-05-10 (×2): 1600 mg via ORAL
  Filled 2020-05-09 (×2): qty 2

## 2020-05-09 MED ORDER — NIFEDIPINE ER OSMOTIC RELEASE 30 MG PO TB24
90.0000 mg | ORAL_TABLET | Freq: Every day | ORAL | Status: DC
  Administered 2020-05-09: 90 mg via ORAL
  Filled 2020-05-09 (×3): qty 3
  Filled 2020-05-09: qty 1

## 2020-05-09 MED ORDER — CINACALCET HCL 30 MG PO TABS
30.0000 mg | ORAL_TABLET | Freq: Every day | ORAL | Status: DC
  Filled 2020-05-09 (×2): qty 1

## 2020-05-09 MED ORDER — SODIUM CHLORIDE 0.9% IV SOLUTION: Freq: Once | INTRAVENOUS | Status: DC

## 2020-05-09 NOTE — Progress Notes (Signed)
PROGRESS NOTE    ASHANTEE DEUPREE  LSL:[id number] DOB: 03-22-1981 DOA: 05/06/2020 PCP: Letta Median, MD   Brief Narrative:  Tashaya Kakos is a 39 year old female with a past medical history significant for ESRD on HD (Monday, Wednesday, Friday), seizure disorder, hypertension, and lupus who presented to [facility] ED on 05/06/2020 following a witnessed seizure.  Patient is currently in the postictal state and sedated with no family present, therefore history is obtained from ED and nursing notes.  Per notes EMS was dispatched due to the patient feeling malaise throughout the day with headache and nausea, of which she missed her scheduled hemodialysis treatment today (reportedly received HD 2 days ago).  Shortly after the fire department arrived, she had a witnessed 30-60 second generalized tonic-clonic seizure.  She required 1 mg of Versed x2, but remained agitated during transport.  Upon arrival to the ED she remained confused, agitated, combative, and trying to pull out her Dialysis catheter.  She was noted to be markedly hypertensive with blood pressure 218/124.  She was given Ativan without improvement in agitation,  subsequently given IM Ketamine in order to facilitate medical treatment, and due to risk to herself and medical staff.  She was also given labetalol without improvement in BP, therefore was placed on Nicardipine drip.  Initial work-up in the ED revealed WBC 7.3, hemoglobin 11.8, platelets 62, high-sensitivity troponin 19, hCG normal, bicarb 19, BUN 54, creatinine 13.49.  CT head is negative for any acute intracranial abnormality.  Chest x-ray is negative.  Her COVID-19 PCR is negative.  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  Nephrology and Neurology have been consulted.  Assessment & Plan:   Principal Problem:    Seizures (HCC)/PRES: -seizure prophylaxis and precaution. -continue keppra. -continue with routine scheduled dialysis. -appreciate consult. -pretreatment with companzine    ESRD on hemodialysis [facility]): -Dialysis per nephrology, appreciate consult.  -d/w pt about renal diet and sodium restriction.     Essential hypertension: -Losartan started.  -renal /soidum, restricted diet.     Thrombocytopenia ([place]): -Pt has been anemic and thrombocytopenic since about 2017 on chart review. -We will request hematology consult.    Blurred vision, bilateral: -Pt reports blurred vision but describes floaters and may be due BP or uremia. -Have advised ophthalmology appt as outpatient.    DVT prophylaxis: Heparin Code Status: FULL. Family Communication:  None at bedside.  Disposition Plan:  Home.  Consultants:  Nephrology: Enis Slipper.  Neurology: Dr.Khaliqdina.  Subjective: Pt today was alert and awake and reported blurred vision her bp was high today. Pt has had Mri of brain which was negative. Neurology is following pt and we appreciate consult. Upon further questions pt reports floaters and headaches that have been going on since dialysis. Neurologist at  bedside and have d/w pt and mom in detail about results and headaches. D/W mom and patient that she has been anemic since 2017 she is receiving epo during dialysis which may cause headache but it is essential to find out what her anemia is from suspect ACD. Also advised eye exam for her floaters and headaches. Pt also to f/uw ith pcp for ongoing medical care.   Pt is alert awake and comfortable and denise any complaints today she did reports some diarrhea but she has that chronically per pt.   Objective: Vitals:   05/09/20 1148 05/09/20 1457 05/09/20 1534 05/09/20  1606  BP: (!) 145/104 (!) 151/103 (!) 145/107 (!) 147/111  Pulse: (!) 107 (!) 115 (!) 118 (!) 105  Resp:  19 16 19   Temp: 98.4 F (36.9 C) 98.4 F (36.9 C) 98.5 F (36.9 C)  98.2 F (36.8 C)  TempSrc: Oral Oral Oral Oral  SpO2: 100% 100% 100% 100%  Weight:      Height:        Intake/Output Summary (Last 24 hours) at 05/09/2020 1715 Last data filed at 05/09/2020 1359 Gross per 24 hour  Intake 360 ml  Output 150 ml  Net 210 ml   Filed Weights   05/08/20 0122 05/08/20 0607 05/09/20 0506  Weight: 45.1 kg 45 kg 45.5 kg    Examination: Blood pressure (!) 147/111, pulse (!) 105, temperature 98.2 F (36.8 C), temperature source Oral, resp. rate 19, height 5\' 2"  (1.575 m), weight 45.5 kg, SpO2 100 %.  General exam: Appears calm and comfortable  Respiratory system: Clear to auscultation. Respiratory effort normal. Cardiovascular system: S1 & S2 heard, RRR. No JVD, murmurs, rubs, gallops or clicks. No pedal edema. Gastrointestinal system: Abdomen is nondistended, soft and nontender. No organomegaly or masses felt. Normal bowel sounds heard. Central nervous system: Alert and oriented. No focal neurological deficits. Extremities: Symmetric 5 x 5 power. Skin: No rashes, lesions or ulcers Psychiatry: Judgement and insight appear normal. Mood & affect appropriate.   Data Reviewed: I have personally reviewed following labs and imaging studies  CBC: Recent Labs  Lab 05/06/20 0023 05/06/20 2245 05/07/20 0344 05/08/20 0527 05/09/20 0401  WBC 10.6* 7.3 8.4 8.0 6.4  NEUTROABS  --  6.4  --   --   --   HGB 12.6 11.8* 12.6 9.3* 7.3*  HCT 40.3 37.2 39.9 27.3* 21.5*  MCV 98.1 96.9 95.7 90.7 91.9  PLT 51* 62* 45* 46* 35*   Basic Metabolic Panel: Recent Labs  Lab 05/06/20 2245 05/07/20 0607 05/08/20 0527 05/08/20 0721 05/09/20 0401  NA 141 140 137  --  137  K 4.5 4.2 5.1  --  4.6  CL 105 111 102  --  99  CO2 19* 14* 19*  --  26  GLUCOSE 104* 90 124*  --  78  BUN 54* 60* 73*  --  34*  CREATININE 13.49* 12.27* 14.20*  --  8.42*  CALCIUM 9.6 8.4* 8.6*  --  8.5*  MG  --  2.0 2.0  --  2.7*  PHOS  --  4.7* 7.0* 7.0* 4.3   GFR: Estimated Creatinine  Clearance: 6.6 mL/min (A) (by C-G formula based on SCr of 8.42 mg/dL (H)). Liver Function Tests: Recent Labs  Lab 05/06/20 2245  AST 26  ALT 10  ALKPHOS 57  BILITOT 1.0  PROT 7.6  ALBUMIN 3.8     Recent Results (from the past 240 hour(s))  SARS Coronavirus 2 by RT PCR (hospital order, performed in [facility] hospital lab) Nasopharyngeal Nasopharyngeal Swab     Status: None   Collection Time: 05/06/20 10:45 PM   Specimen: Nasopharyngeal Swab  Result Value Ref Range Status   SARS Coronavirus 2 NEGATIVE NEGATIVE Final    Comment: (NOTE) SARS-CoV-2 target nucleic acids are NOT DETECTED.  The SARS-CoV-2 RNA is generally detectable in upper and lower respiratory specimens during the acute phase of infection. The lowest concentration of SARS-CoV-2 viral copies this assay can detect is 250 copies / mL. A negative result does not preclude SARS-CoV-2 infection and should not be used as the sole basis  for treatment or other patient management decisions.  A negative result may occur with improper specimen collection / handling, submission of specimen other than nasopharyngeal swab, presence of viral mutation(s) within the areas targeted by this assay, and inadequate number of viral copies (<250 copies / mL). A negative result must be combined with clinical observations, patient history, and epidemiological information.  Fact Sheet for Patients:   StrictlyIdeas.no  Fact Sheet for Healthcare Providers: BankingDealers.co.za  This test is not yet approved or  cleared by the [place] FDA and has been authorized for detection and/or diagnosis of SARS-CoV-2 by FDA under an Emergency Use Authorization (EUA).  This EUA will remain in effect (meaning this test can be used) for the duration of the COVID-19 declaration under Section 564(b)(1) of the Act, 21 U.S.C. section 360bbb-3(b)(1), unless the authorization is terminated or revoked  sooner.  Performed at [facility], [address] [place], [place] [address]   Culture, Urine     Status: Abnormal   Collection Time: 05/07/20  3:44 AM   Specimen: Urine, Random  Result Value Ref Range Status   Specimen Description   Final    URINE, RANDOM Performed at [facility], [address] [place], [place] [address]    Special Requests   Final    NONE Performed at [facility], [address] [place], [place] [address]    Culture (A)  Final    <10,000 COLONIES/mL INSIGNIFICANT GROWTH Performed at [facility], [place] [address] [place], [place] [address]    Report Status 05/08/2020 FINAL  Final      Radiology Studies: MR BRAIN WO CONTRAST  Result Date: 05/08/2020 CLINICAL DATA:  Encephalopathy.  Lupus nephritis. EXAM: MRI HEAD WITHOUT CONTRAST TECHNIQUE: Multiplanar, multiecho pulse sequences of the brain and surrounding structures were obtained without intravenous contrast. COMPARISON:  12/23/2019 FINDINGS: Brain: No acute infarct, acute hemorrhage or extra-axial collection. There are multiple areas of subcortical hyperintense T2-weighted signal within posterior parietal lobes, the left greater than right occipital lobes and cerebellum. The supratentorial lesions are less numerous than on the prior study, but the cerebellar lesions are larger. Normal volume of CSF spaces. No chronic microhemorrhage. Normal midline structures. Vascular: Normal flow voids. Skull and upper cervical spine: Normal marrow signal. Sinuses/Orbits: Negative. Other: None. IMPRESSION: 1. Multiple areas of subcortical hyperintense T2-weighted signal within the posterior parietal lobes, left greater than right occipital lobes and cerebellum. 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 The extent of supratentorial involvement is decreased relative to the prior study. 2. No acute ischemia or hemorrhage. Electronically Signed   By: Ulyses Jarred M.D.   On: 05/08/2020 03:07     Scheduled Meds: . sodium chloride   Intravenous Once  . carvedilol  12.5 mg Oral BID WC  . Chlorhexidine Gluconate Cloth  6 each Topical Q0600  . cinacalcet  30 mg Oral Q supper  . irbesartan  150 mg Oral Daily  . losartan  50 mg Oral Daily  . multivitamin  1 tablet Oral QHS  . NIFEdipine  90 mg Oral Daily  . sevelamer carbonate  1,600 mg Oral TID WC   Continuous Infusions: . dexmedetomidine (PRECEDEX) IV infusion Stopped (05/07/20 0439)  . levETIRAcetam 500 mg (05/09/20 1000)  . levETIRAcetam    . niCARDipine Stopped (05/07/20 0457)     LOS: 3 days    Para Skeans, MD [facility] Pager [phone] If 7PM-7AM, please contact night-coverage [email or web address] Password [facility] 05/09/2020,  5:15 PM

## 2020-05-09 NOTE — Progress Notes (Signed)
NEUROLOGY CONSULTATION PROGRESS NOTE   Date of service: May 09, 2020 Patient Name: Patricia Maldonado MRN:  [id number] DOB:  12-Sep-1981  History of Present Illness  Patricia Maldonado is a 39 y.o. 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 Vitals with SBP elevated to 200+ on presentation and concern for PRES. MRI Brain is supportive of a diagnosis of PRES at this time with multiple areas of subcortical hyperintense T2-weighted signal  within the posterior parietal lobes, left greater than right occipital lobes and cerebellum.   Interval Hx  No headaches today. Wants to go home. Compazine helped her significantly with headache and was asking if she could get this as a pretreatment for her dialysis.  Past History   Past Medical History:  Diagnosis Date  . Acute renal failure (ARF) ([place])   . Anemia   . Arthritis   . History of kidney stones   . Hypertension    OFF BP MEDS X 3 WEEKS DUE TO BP CONTROL  . Lupus nephritis ([place]) 12/2016   DX DURING HOSPITAL ADMISSION  . Rash   . Staghorn calculus   . 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 CYSTOSCOPY/URETEROSCOPY/HOLMIUM LASER/STENT PLACEMENT Right 02/21/2017   Procedure: CYSTOSCOPY/URETEROSCOPY/HOLMIUM LASER/STENT EXCHANGE;  Surgeon: [name], MD;  Location: [facility];  Service: Urology;  Laterality: Right;  . DIALYSIS/PERMA CATHETER INSERTION N/A 11/30/2018   Procedure: DIALYSIS/PERMA CATHETER INSERTION;  Surgeon: Algernon Huxley, MD;  Location: [facility];  Service: Cardiovascular;  Laterality: N/A;  . IR NEPHROSTOMY PLACEMENT RIGHT  01/23/2017  . NEPHROLITHOTOMY Right 01/24/2017   Procedure: NEPHROLITHOTOMY PERCUTANEOUS;  Surgeon: [name], MD;  Location: [facility];  Service: Urology;  Laterality: Right;  . PERITONEAL CATHETER INSERTION     . TUBAL LIGATION     Family History  Problem Relation Age of Onset  . Thyroid disease Mother   . Thyroid disease Father   . Diabetes Father    Social History   Socioeconomic History  . Marital status: Single    Spouse name: Not on file  . Number of children: Not on file  . Years of education: Not on file  . Highest education level: Not on file  Occupational History  . Not on file  Tobacco Use  . Smoking status: Current Every Day Smoker    Packs/day: 0.25    Years: 15.00    Pack years: 3.75    Types: Cigarettes  . Smokeless tobacco: Never Used  Vaping Use  . Vaping Use: Never used  Substance and Sexual Activity  . Alcohol use: No  . Drug use: Yes    Types: Marijuana    Comment: OCC  . Sexual activity: Yes  Other Topics Concern  . Not on file  Social History Narrative  . Not on file   Social Determinants of Health   Financial Resource Strain:   . Difficulty of Paying Living Expenses:   Food Insecurity:   . Worried About Charity fundraiser in the Last Year:   . Arboriculturist in the Last Year:   Transportation Needs:   . Film/video editor (Medical):   Marland Kitchen Lack of Transportation (Non-Medical):   Physical Activity:   . Days of Exercise per Week:   . Minutes of Exercise per Session:  Stress:   . Feeling of Stress :   Social Connections:   . Frequency of Communication with Friends and Family:   . Frequency of Social Gatherings with Friends and Family:   . Attends Religious Services:   . Active Member of Clubs or Organizations:   . Attends Archivist Meetings:   Marland Kitchen Marital Status:    Allergies  Allergen Reactions  . Penicillins Swelling    Yeast infection Has patient had a PCN reaction causing immediate rash, facial/tongue/throat swelling, SOB or lightheadedness with hypotension:No Has patient had a PCN reaction causing severe rash involving mucus membranes or skin necrosis: Yes Has patient had a PCN reaction that required hospitalization  No Has patient had a PCN reaction occurring within the last 10 years: No If all of the above answers are "NO", then may proceed with Cephalosporin use.  Yeast infection Has patient had a PCN reaction causing immediate rash, facial/tongue/throat swelling, SOB or lightheadedness with hypotension:No Has patient had a PCN reaction causing severe rash involving mucus membranes or skin necrosis: Yes Has patient had a PCN reaction that required hospitalization No Has patient had a PCN reaction occurring within the last 10 years: No If all of the above answers are "NO", then may proceed with Cephalosporin use.    Medications   Medications Prior to Admission  Medication Sig Dispense Refill Last Dose  . calcitRIOL (ROCALTROL) 0.5 MCG capsule 2 Capsule(s) By Mouth Daily   Past Week at Unknown time  . carvedilol (COREG) 12.5 MG tablet tablet Take 3 tablets (37.5 mg total) by mouth two (2) times a day   Past Week at Unknown time  . cinacalcet (SENSIPAR) 30 MG tablet Take 30 mg by mouth daily.   Past Week at Unknown time  . citalopram (CELEXA) 10 MG tablet Take 10 mg by mouth daily.   Past Week at Unknown time  . gentamicin cream (GARAMYCIN) 0.1 % APPLY TO EXIT SITE DAILY   Past Week at Unknown time  . hydroxychloroquine (PLAQUENIL) 200 MG tablet    Past Week at Unknown time  . hydroxyurea (DROXIA) 200 MG capsule Take 200 mg by mouth daily.   Past Week at Unknown time  . levETIRAcetam (KEPPRA) 500 MG tablet TAKE 1 TABLET BY MOUTH TWICE DAILY WITH ADDITIONAL 1 TABLET AFTER DIALYSIS   Past Week at Unknown time  . losartan (COZAAR) 50 MG tablet Take 50 mg by mouth daily.   Past Week at Unknown time  . mycophenolate (CELLCEPT) 500 MG tablet Take 2 tablets (1,000 mg total) by mouth 2 (two) times daily. 120 tablet 0 Past Week at Unknown time  . NIFEdipine (PROCARDIA XL/NIFEDICAL-XL) 90 MG 24 hr tablet Take 90 mg by mouth daily.   Past Week at Unknown time  . ondansetron (ZOFRAN) 4 MG tablet Take 4 mg by mouth  2 (two) times daily as needed.   Past Week at PRN  . sevelamer (RENAGEL) 800 MG tablet Take by mouth 3 (three) times daily with meals.   Past Week at Unknown time  . valsartan (DIOVAN) 160 MG tablet Take 160 mg by mouth daily.   Past Week at Unknown time     Vitals  Temp:  [97.5 F (36.4 C)-99.5 F (37.5 C)] 98.4 F (36.9 C) (08/07 1148) Pulse Rate:  [87-118] 107 (08/07 1148) Resp:  [16-25] 17 (08/07 0819) BP: (125-153)/(95-119) 145/104 (08/07 1148) SpO2:  [99 %-100 %] 100 % (08/07 1148) Weight:  [45.5 kg] 45.5 kg (08/07 0506)  Body  mass index is 18.35 kg/m.  Physical Exam   General: Laying comfortably in bed; in no acute distress.  HENT: Normal oropharynx and mucosa. Normal external appearance of ears and nose. Neck: Supple, no pain or tenderness CV: No JVD. No peripheral edema. Pulmonary: Symmetric Chest rise. Normal respiratory effort. Abdomen: Soft to touch, non-tender Ext: No cyanosis, edema, or deformity  Skin: No rash. Normal palpation of skin.   Musculoskeletal: Normal digits and nails by inspection. No clubbing.  Neurologic Examination  Mental status/Cognition: Alert, oriented to self, place, month and year, good attention. Speech/language: Fluent, comprehension intact, object naming intact, repetition intact. Cranial nerves:   CN II Pupils equal and reactive to light, no VF deficits   CN III,IV,VI EOM intact, no gaze preference or deviation, no nystagmus   CN V normal sensation in V1, V2, and V3 segments bilaterally   CN VII no asymmetry, no nasolabial fold flattening    CN VIII normal hearing to speech    CN IX & X normal palatal elevation, no uvular deviation    CN XI 5/5 head turn and 5/5 shoulder shrug bilaterally    CN XII midline tongue protrusion    Motor:  Muscle bulk: decreased, tone normal, pronator drift none Mvmt Root Nerve  Muscle Right Left Comments  SA C5/6 Ax Deltoid 5 5   EF C5/6 Mc Biceps 5 5   EE C6/7/8 Rad Triceps 5 5   WF C6/7 Med FCR 5  5   WE C7/8 PIN ECU 5 5   F Ab C8/T1 U ADM/FDI 5 5   HF L1/2/3 Fem Illopsoas 5 5   KE L2/3/4 Fem Quad 5 5   DF L4/5 D Peron Tib Ant 5 5   PF S1/2 Tibial Grc/Sol 5 5    Reflexes:  Right Left Comments  Pectoralis      Biceps (C5/6)     Brachioradialis (C5/6) 2 2    Triceps (C6/7) 2 2    Patellar (L3/4) 2 2    Achilles (S1) 1 1    Hoffman      Plantar down down   Jaw jerk    Sensation:  Light touch Intact throughout   Pin prick Intact throughout   Temperature    Vibration   Proprioception    Coordination/Complex Motor:  - Finger to Nose intact BL with no ataxia, no dysmettria. - Heel to shin normal BL. - Rapid alternating movement normal - Gait: deferred.  Labs   Lab Results  Component Value Date   NA 137 05/09/2020   K 4.6 05/09/2020   CL 99 05/09/2020   CO2 26 05/09/2020   GLUCOSE 78 05/09/2020   BUN 34 (H) 05/09/2020   CREATININE 8.42 (H) 05/09/2020   CALCIUM 8.5 (L) 05/09/2020   ALBUMIN 3.8 05/06/2020   AST 26 05/06/2020   ALT 10 05/06/2020   ALKPHOS 57 05/06/2020   BILITOT 1.0 05/06/2020   GFRNONAA 5 (L) 05/09/2020   GFRAA 6 (L) 05/09/2020     Imaging and Diagnostic studies  MRI Brain without contrast:  1. Multiple areas of subcortical hyperintense T2-weighted signal within the posterior parietal lobes, left greater than right occipital lobes and cerebellum. 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 The extent of supratentorial involvement is decreased relative to the prior study. 2. No acute ischemia or hemorrhage.   Impression   Patricia Maldonado is a 38 y.o. 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 Vitals with SBP elevated to 200+ on presentation and concern for PRES. MRI Brain is consistent with PRES. Likely she had a provoked seizure in the setting of PRES.  The timeline of her symptoms with  missing dialysis x couple session, followed by lethargy and then a seizure is more consistent with PRES than Neuropsychiatric SLE.  Discussed with patient and mom at bedside the improtance of dialysis and blood pressure control to prevent further episodes of PRES.  Recommendations  - Continue Keppra 500mg  BID - Can do IV Compazine 10mg  as a pretreatment for dialysis. Endorses significant headache and nausea with dialysis that makes it difficult for her to complete the session. - Follow up with outpatient Neurology. - Neurology inpatient team will signoff. Please feel free to contact us with any questions or concerns. ______________________________________________________________________   Thank you for the opportunity to take part in the care of this patient. If you have any further questions, please contact the neurology consultation attending.  Signed,  [place] Pager Number [phone]

## 2020-05-09 NOTE — Progress Notes (Signed)
[facility]  ROUNDING NOTE   Subjective:   Hemodialysis treatment yesterday. Tolerated treatment well. UF 543mL.   Sitter at bedside  MRI consistent with PRES.   Weaned off nicardipine gtt.   BP 145/104  Patient is concerned that her neck is swelling.   Objective:  Vital signs in last 24 hours:  Temp:  [97.5 F (36.4 C)-99.5 F (37.5 C)] 98.4 F (36.9 C) (08/07 1148) Pulse Rate:  [87-118] 107 (08/07 1148) Resp:  [16-31] 17 (08/07 0819) BP: (125-154)/(91-119) 145/104 (08/07 1148) SpO2:  [99 %-100 %] 100 % (08/07 1148) Weight:  [45.5 kg] 45.5 kg (08/07 0506)  Weight change: 0.367 kg Filed Weights   05/08/20 0122 05/08/20 0607 05/09/20 0506  Weight: 45.1 kg 45 kg 45.5 kg    Intake/Output: I/O last 3 completed shifts: In: 120 [P.O.:120] Out: 706 [Urine:152; Other:554]   Intake/Output this shift:  Total I/O In: 240 [P.O.:240] Out: -   Physical Exam: General: No acute distress, laying in bed  Head: Normocephalic, atraumatic. Moist oral mucosal membranes  Eyes: Anicteric  Neck: Supple  Lungs:  Clear to auscultation, normal effort  Heart: regular  Abdomen:  Soft, nontender, bowel sounds present  Extremities: No peripheral edema.  Neurologic: Awake, alert, following commands  Skin: No lesions  Access: IJ PermCath    Basic Metabolic Panel: Recent Labs  Lab 05/06/20 2245 05/06/20 2245 05/07/20 0607 05/08/20 0527 05/08/20 0721 05/09/20 0401  NA 141  --  140 137  --  137  K 4.5  --  4.2 5.1  --  4.6  CL 105  --  111 102  --  99  CO2 19*  --  14* 19*  --  26  GLUCOSE 104*  --  90 124*  --  78  BUN 54*  --  60* 73*  --  34*  CREATININE 13.49*  --  12.27* 14.20*  --  8.42*  CALCIUM 9.6   < > 8.4* 8.6*  --  8.5*  MG  --   --  2.0 2.0  --  2.7*  PHOS  --   --  4.7* 7.0* 7.0* 4.3   < > = values in this interval not displayed.    Liver Function Tests: Recent Labs  Lab 05/06/20 2245  AST 26  ALT 10  ALKPHOS 57  BILITOT 1.0  PROT 7.6   ALBUMIN 3.8   No results for input(s): LIPASE, AMYLASE in the last 168 hours. No results for input(s): AMMONIA in the last 168 hours.  CBC: Recent Labs  Lab 05/06/20 0023 05/06/20 2245 05/07/20 0344 05/08/20 0527 05/09/20 0401  WBC 10.6* 7.3 8.4 8.0 6.4  NEUTROABS  --  6.4  --   --   --   HGB 12.6 11.8* 12.6 9.3* 7.3*  HCT 40.3 37.2 39.9 27.3* 21.5*  MCV 98.1 96.9 95.7 90.7 91.9  PLT 51* 62* 45* 46* 35*    Cardiac Enzymes: No results for input(s): CKTOTAL, CKMB, CKMBINDEX, TROPONINI in the last 168 hours.  BNP: Invalid input(s): POCBNP  CBG: No results for input(s): GLUCAP in the last 168 hours.  Microbiology: Results for orders placed or performed during the hospital encounter of 05/06/20  SARS Coronavirus 2 by RT PCR (hospital order, performed in [facility] hospital lab) Nasopharyngeal Nasopharyngeal Swab     Status: None   Collection Time: 05/06/20 10:45 PM   Specimen: Nasopharyngeal Swab  Result Value Ref Range Status   SARS Coronavirus 2 NEGATIVE NEGATIVE Final  Comment: (NOTE) SARS-CoV-2 target nucleic acids are NOT DETECTED.  The SARS-CoV-2 RNA is generally detectable in upper and lower respiratory specimens during the acute phase of infection. The lowest concentration of SARS-CoV-2 viral copies this assay can detect is 250 copies / mL. A negative result does not preclude SARS-CoV-2 infection and should not be used as the sole basis for treatment or other patient management decisions.  A negative result may occur with improper specimen collection / handling, submission of specimen other than nasopharyngeal swab, presence of viral mutation(s) within the areas targeted by this assay, and inadequate number of viral copies (<250 copies / mL). A negative result must be combined with clinical observations, patient history, and epidemiological information.  Fact Sheet for Patients:   StrictlyIdeas.no  Fact Sheet for Healthcare  Providers: BankingDealers.co.za  This test is not yet approved or  cleared by the [place] FDA and has been authorized for detection and/or diagnosis of SARS-CoV-2 by FDA under an Emergency Use Authorization (EUA).  This EUA will remain in effect (meaning this test can be used) for the duration of the COVID-19 declaration under Section 564(b)(1) of the Act, 21 U.S.C. section 360bbb-3(b)(1), unless the authorization is terminated or revoked sooner.  Performed at [facility], [address] [place], [place] [address]   Culture, Urine     Status: Abnormal   Collection Time: 05/07/20  3:44 AM   Specimen: Urine, Random  Result Value Ref Range Status   Specimen Description   Final    URINE, RANDOM Performed at [facility], [address], [place], [place] [address]    Special Requests   Final    NONE Performed at [facility], [address] [place], [place] [address]    Culture (A)  Final    <10,000 COLONIES/mL INSIGNIFICANT GROWTH Performed at [facility], [place] [address], [place], [place] [address]    Report Status 05/08/2020 FINAL  Final    Coagulation Studies: No results for input(s): LABPROT, INR in the last 72 hours.  Urinalysis: Recent Labs    05/07/20 0344  COLORURINE YELLOW*  LABSPEC 1.012  PHURINE 8.0  GLUCOSEU NEGATIVE  HGBUR LARGE*  BILIRUBINUR NEGATIVE  KETONESUR NEGATIVE  PROTEINUR >=300*  NITRITE NEGATIVE  LEUKOCYTESUR MODERATE*      Imaging: MR BRAIN WO CONTRAST  Result Date: 05/08/2020 CLINICAL DATA:  Encephalopathy.  Lupus nephritis. EXAM: MRI HEAD WITHOUT CONTRAST TECHNIQUE: Multiplanar, multiecho pulse sequences of the brain and surrounding structures were obtained without intravenous contrast. COMPARISON:  12/23/2019 FINDINGS: Brain: No acute infarct, acute hemorrhage or extra-axial collection. There are multiple areas of subcortical hyperintense T2-weighted signal within posterior  parietal lobes, the left greater than right occipital lobes and cerebellum. The supratentorial lesions are less numerous than on the prior study, but the cerebellar lesions are larger. Normal volume of CSF spaces. No chronic microhemorrhage. Normal midline structures. Vascular: Normal flow voids. Skull and upper cervical spine: Normal marrow signal. Sinuses/Orbits: Negative. Other: None. IMPRESSION: 1. Multiple areas of subcortical hyperintense T2-weighted signal within the posterior parietal lobes, left greater than right occipital lobes and cerebellum. 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 The extent of supratentorial involvement is decreased relative to the prior study. 2. No acute ischemia or hemorrhage. Electronically Signed   By: Ulyses Jarred M.D.   On: 05/08/2020 03:07     Medications:   . dexmedetomidine (PRECEDEX) IV infusion Stopped (05/07/20 0439)  . levETIRAcetam 500 mg (05/09/20 1000)  . levETIRAcetam    .  niCARDipine Stopped (05/07/20 0457)   . sodium chloride   Intravenous Once  . Chlorhexidine Gluconate Cloth  6 each Topical Q0600  . losartan  50 mg Oral Daily  . multivitamin  1 tablet Oral QHS   acetaminophen, docusate sodium, hydrALAZINE, LORazepam, ondansetron (ZOFRAN) IV, polyethylene glycol, promethazine  Assessment/ Plan:    Ms. BRANDEN VINE is a 39 y.o. black female with end stage renal disease on hemodialysis secondary to lupus nephritis, hypertension, seizure disorder, staghorn calculus who was admitted to [facility] on 05/06/2020 for Seizure ([place]) [R56.9] Seizures ([place]) [R56.9] ESRD on dialysis ([place]) [N18.6, Z99.2] PRES (posterior reversible encephalopathy syndrome) [I67.83]  [facility] MWF [place]. RIJ permcath 41kg  1. End Stage Renal Disease: hemodialysis treatment yesterday. Tolerated treatment well. Next scheduled treatment for Monday.   2. Hypertension: with emergency and PRES. Required nicardipine gtt.   - Continue amlodipine.  - Restart home blood pressure regimen. Valsartan, nifedipine and carvedilol.   3. Anemia of chronic kidney disease: hemoglobin dropped from 9.3 to 7.3.  PRBC transfusion is scheduled. Will see if this can be avoided.   4. Secondary Hyperparathyroidism: with hyperphosphatemia.  - restart sevelamer and cinacalcet.   LOS: 3 Nahum Sherrer 8/7/202112:18 PM

## 2020-05-09 NOTE — Progress Notes (Signed)
Spoke with dr. Posey Pronto regarding orders for Air cabin crew. Patient has been calm all shift with no issue. Per md she has not seen any agitation in patient and it is okay to discontinue safety 1:1 sitter. Will continue to monitor

## 2020-05-10 LAB — HEMOGLOBIN AND HEMATOCRIT, BLOOD
HCT: 24.2 % — ABNORMAL LOW (ref 36.0–46.0)
Hemoglobin: 7.7 g/dL — ABNORMAL LOW (ref 12.0–15.0)

## 2020-05-10 LAB — TYPE AND SCREEN
ABO/RH(D): B POS
Antibody Screen: NEGATIVE
Unit division: 0

## 2020-05-10 LAB — BPAM RBC
Blood Product Expiration Date: 202109062359
ISSUE DATE / TIME: 202108071500
Unit Type and Rh: 7300

## 2020-05-10 MED ORDER — PROCHLORPERAZINE MALEATE 10 MG PO TABS
10.0000 mg | ORAL_TABLET | Freq: Four times a day (QID) | ORAL | 0 refills | Status: AC | PRN
Start: 2020-05-10 — End: ?

## 2020-05-10 MED ORDER — RENA-VITE PO TABS: 1.0000 | ORAL_TABLET | Freq: Every day | ORAL | 0 refills | Status: AC

## 2020-05-10 NOTE — Discharge Instructions (Signed)
Dialysis Dialysis is a procedure that is done when the kidneys have stopped working properly (kidney failure). It may also be done earlier if it may help improve symptoms. During dialysis, wastes, salt, and extra water are removed from the blood, and the levels of certain minerals in the blood are maintained. Dialysis is done in sessions which are continued until the kidneys get better. If the kidneys cannot get better, such as in end-stage kidney disease, dialysis is continued for life or until you receive a new kidney from a donor (kidney transplant). There are two types of dialysis: hemodialysis and peritoneal dialysis. What is hemodialysis?        Hemodialysis is when a machine called a dialyzer is used to filter the blood. Before starting hemodialysis, you will have surgery to create a site where blood can be removed from the body and returned to the body (vascular access). There are three types of vascular accesses:  Arteriovenous fistula. This type of access is created when an artery and a vein (usually in the arm) are connected during surgery. The arteriovenous fistula usually takes 1-6 months to develop after surgery. It may last longer than the other types of vascular accesses and is less likely to become infected or cause blood clots.  Arteriovenous graft. This type of access is created when an artery and a vein in the arm are connected during surgery with a tube. An arteriovenous graft can usually be used within 2-3 weeks of surgery.  A venous catheter. To create this type of access, a thin tube (catheter) is placed in a large vein in your neck, chest, or groin. A venous catheter can be used right away. It is usually used as a temporary access when dialysis needs to begin immediately. During hemodialysis, blood leaves your body through your access site. It travels through a tube to the dialyzer, where it is filtered. The blood then returns to your body through another tube. Hemodialysis  is usually done at a hospital or dialysis center three times a week. Visits last about 3-5 hours. With special training, it may also be done at home with the help of another person. What is peritoneal dialysis? Peritoneal dialysis is when the thin lining of the abdomen (peritoneum) and a fluid called dialysate are used to filter the blood. Before starting peritoneal dialysis, you will have surgery to place a catheter in your abdomen. The catheter will be used to transfer dialysate to and from your abdomen. At the start of a session, your abdomen is filled with dialysate. During the session, wastes, salt, and extra water in the blood pass through the peritoneum and into the dialysate. The dialysate is drained from the body at the end of the session. The process of filling and draining the dialysate is called an exchange. Exchanges are repeated until you have used up all the dialysate for the day. You may do peritoneal dialysis at home or at almost any other location. It is done every day. You may need up to five exchanges a day. Each exchange takes about 30-40 minutes. The amount of time the dialysate is in your body between exchanges is called a dwell. The dwell usually lasts 1.5-3 hours and can vary with each person. You may choose to do exchanges at night while you sleep, using a machine called a cycler. Which type of dialysis should I choose? Both types of dialysis have advantages and disadvantages. Talk with your health care provider about which type of dialysis is best  for you. Your lifestyle, preferences, and medical condition should be considered. In some cases, only one type of dialysis can be chosen. Advantages of hemodialysis  It is done less often than peritoneal dialysis.  Someone else can do the dialysis for you.  If you go to a dialysis center: ? Your health care provider can recognize any problems you may be having. ? You can interact with others who are having dialysis. This can  provide you with emotional support. Disadvantages of hemodialysis  Hemodialysis may cause cramps and low blood pressure. It may leave you feeling tired on the days you have the treatment.  If you go to a dialysis center, you will need to make weekly appointments and work around the centers schedule.  You will need to take extra care when traveling. If you usually get treatment in a dialysis center, you will need to arrange to visit a dialysis center near your destination. If you are having treatments at home, you will need to take the dialyzer with you when traveling.  There are more eating restrictions than with peritoneal dialysis. Advantages of peritoneal dialysis  It is less likely than hemodialysis to cause cramps and low blood pressure.  There are fewer eating restrictions than with hemodialysis.  You may do exchanges on your own wherever you are, including when you travel. Disadvantages of peritoneal dialysis  It is done more often than hemodialysis.  Doing peritoneal dialysis requires you to have a good use (dexterity) of your hands. You must also be able to lift bags.  You must learn how to make your equipment free of germs (sterilization techniques). You will need to use these techniques every day to prevent infection. What changes will I need to make to my diet during dialysis? Both types of dialysis require you to make some changes to your diet. For example, you will need to limit your intake of foods that contain a lot of phosphorus and potassium. You will also need to limit your fluid intake. A diet and nutrition specialist (dietitian) can help you make a meal plan that can help improve your dialysis and your health. What should I expect when starting dialysis? Adjusting to the dialysis treatment, schedule, and diet can take some time. You may need to stop working and may not be able to do some of your normal activities. You may feel anxious or depressed when starting  dialysis. Over time, many people feel better overall because of dialysis. You may be able to return to work after making some changes, such as reducing work intensity. Where to find more information  [place]: www.kidney.org  American Association of Kidney Patients: BombTimer.gl  [employer]: www.kidneyfund.org Summary  During dialysis, wastes, salt, and extra water are removed from the blood, and the levels of certain minerals in the blood are maintained. There are two types of dialysis: hemodialysis and peritoneal dialysis.  Hemodialysis is when a machine called a dialyzer is used to filter the blood.  Hemodialysis is usually done by a health care provider at a hospital or dialysis center three times a week.  Peritoneal dialysis is when the peritoneum is used as a filter. You may do peritoneal dialysis at home or at almost any other location.  Both types of dialysis have advantages and disadvantages. Talk with your health care provider about which type of dialysis is best for you. This information is not intended to replace advice given to you by your health care provider. Make sure you  discuss any questions you have with your health care provider. Document Revised: 02/05/2019 Document Reviewed: 11/15/2016 Elsevier Patient Education  [place].   Seizure, Adult A seizure is a sudden burst of abnormal electrical activity in the brain. Seizures usually last from 30 seconds to 2 minutes. The abnormal activity temporarily interrupts normal brain function. A seizure can cause many different symptoms depending on where in the brain it starts. What are the causes? Common causes of this condition include:  Fever or infection.  Brain abnormality, injury, bleeding, or tumor.  Low blood sugar.  Metabolic disorders or other conditions that are passed from parent to child (are inherited).  Reaction to a substance, such as a drug or a medicine, or suddenly  stopping the use of a substance (withdrawal).  Stroke.  Developmental disorders such as autism or cerebral palsy. In some cases, the cause of this condition may not be known. Some people who have a seizure never have another one. Seizures usually do not cause brain damage or permanent problems unless they are prolonged. A person who has repeated seizures over time without a clear cause has a condition called epilepsy. What increases the risk? You are more likely to develop this condition if you have:  A family history of epilepsy.  Had a tonic-clonic seizure in the past. This is a type of seizure that involves whole-body contraction of muscles and a loss of consciousness.  Autism, cerebral palsy, or other brain disorders.  A history of head trauma, lack of oxygen at birth, or strokes. What are the signs or symptoms? There are many different types of seizures. The symptoms of a seizure vary depending on the type of seizure you have. Examples of symptoms during a seizure include:  Uncontrollable shaking (convulsions).  Stiffening of the body.  Loss of consciousness.  Head nodding.  Staring.  Not responding to sound or touch.  Loss of bladder or bowel control. Some people have symptoms right before a seizure happens (aura) and right after a seizure happens (postictal). Symptoms before a seizure may include:  Fear or anxiety.  Nausea.  Feeling like the room is spinning (vertigo).  A feeling of having seen or heard something before (dj vu).  Odd tastes or smells.  Changes in vision, such as seeing flashing lights or spots. Symptoms after a seizure may include:  Confusion.  Sleepiness.  Headache.  Weakness on one side of the body. How is this diagnosed? This condition may be diagnosed based on:  A description of your symptoms. Video of your seizures can be helpful.  Your medical history.  A physical exam. You may also have tests, including:  Blood  tests.  CT scan.  MRI.  Electroencephalogram (EEG). This test measures electrical activity in the brain. An EEG can predict whether seizures will return (recur).  A spinal tap (also called a lumbar puncture). This is the removal and testing of fluid that surrounds the brain and spinal cord. How is this treated? Most seizures will stop on their own in under 5 minutes, and no treatment is needed. Seizures that last longer than 5 minutes will usually need treatment. Treatment can include:  Medicines given through an IV.  Avoiding known triggers, such as medicines that you take for another condition.  Medicines to treat epilepsy (antiepileptics), if epilepsy caused your seizures.  Surgery to stop seizures, if you have epilepsy that does not respond to medicines. Follow these instructions at home: Medicines  Take over-the-counter and prescription medicines only as  told by your health care provider.  Avoid any substances that may prevent your medicine from working properly, such as alcohol. Activity  Do not drive, swim, or do any other activities that would be dangerous if you had another seizure. Wait until your health care provider says it is safe to do them.  If you live in the [place] check with your local DMV (department of motor vehicles) to find out about local driving laws. Each state has specific rules about when you can legally return to driving.  Get enough rest. Lack of sleep can make seizures more likely to occur. Educating others Teach friends and family what to do if you have a seizure. They should:  Lay you on the ground to prevent a fall.  Cushion your head and body.  Loosen any tight clothing around your neck.  Turn you on your side. If vomiting occurs, this helps keep your airway clear.  Not hold you down. Holding you down will not stop the seizure.  Not put anything into your mouth.  Know whether or not you need emergency care. For example, they should get  help right away if you have a seizure that lasts longer than 5 minutes or have several seizures in a row.  Stay with you until you recover.  General instructions  Contact your health care provider each time you have a seizure.  Avoid anything that has ever triggered a seizure for you.  Keep a seizure diary. Record what you remember about each seizure, especially anything that might have triggered the seizure.  Keep all follow-up visits as told by your health care provider. This is important. Contact a health care provider if:  You have another seizure.  You have seizures more often.  Your seizure symptoms change.  You continue to have seizures with treatment.  You have symptoms of an infection or illness. This might increase your risk of having a seizure. Get help right away if:  You have a seizure that: ? Lasts longer than 5 minutes. ? Is different than previous seizures. ? Leaves you unable to speak or use a part of your body. ? Makes it harder to breathe.  You have: ? A seizure after a head injury. ? Multiple seizures in a row. ? Confusion or a severe headache right after a seizure.  You do not wake up immediately after a seizure.  You injure yourself during a seizure. These symptoms may represent a serious problem that is an emergency. Do not wait to see if the symptoms will go away. Get medical help right away. Call your local emergency services (911 in the [place] Do not drive yourself to the hospital. Summary  Seizures are caused by abnormal electrical activity in the brain. The activity disrupts normal brain function and can cause various symptoms, such as convulsions, abnormal movements, or a change in consciousness.  There are many causes of seizures, including illnesses, medicines, genetic conditions, head injuries, strokes, tumors, substance abuse, or substance withdrawal.  Most seizures will stop on their own in under 5 minutes. Seizures that last longer  than 5 minutes are a medical emergency and require immediate treatment.  Many medicines are used to treat seizures. Take over-the-counter and prescription medicines only as told by your health care provider. This information is not intended to replace advice given to you by your health care provider. Make sure you discuss any questions you have with your health care provider. Document Revised: 12/07/2018 Document Reviewed: 12/07/2018 Elsevier Patient Education  [place].

## 2020-05-10 NOTE — Progress Notes (Signed)
Patient discharge instructions reviewed with patient with medication sheet printed out as well. Patient verbalizes understanding of these instructions and all questions answered. Patient discharged via wheelchair with belongings and printed instructions.

## 2020-05-10 NOTE — Plan of Care (Signed)

## 2020-05-10 NOTE — Progress Notes (Signed)
[facility]  ROUNDING NOTE   Subjective:   Blood pressure 143/99 prior to medication administration.   Patient with no complaints.   Objective:  Vital signs in last 24 hours:  Temp:  [97.5 F (36.4 C)-99.2 F (37.3 C)] 97.7 F (36.5 C) (08/08 0753) Pulse Rate:  [65-118] 88 (08/08 0753) Resp:  [16-19] 17 (08/08 0753) BP: (115-175)/(58-117) 143/99 (08/08 0753) SpO2:  [95 %-100 %] 99 % (08/08 0753) Weight:  [44.9 kg] 44.9 kg (08/08 0306)  Weight change: -0.6 kg Filed Weights   05/08/20 0607 05/09/20 0506 05/10/20 0306  Weight: 45 kg 45.5 kg 44.9 kg    Intake/Output: I/O last 3 completed shifts: In: 49 [P.O.:600; Blood:320] Out: 150 [Urine:150]   Intake/Output this shift:  Total I/O In: 180 [P.O.:180] Out: -   Physical Exam: General: No acute distress, laying in bed  Head: Normocephalic, atraumatic. Moist oral mucosal membranes  Eyes: Anicteric  Neck: Supple  Lungs:  Clear to auscultation, normal effort  Heart: regular  Abdomen:  Soft, nontender, bowel sounds present  Extremities: No peripheral edema.  Neurologic: Awake, alert, following commands  Skin: No lesions  Access: IJ PermCath    Basic Metabolic Panel: Recent Labs  Lab 05/06/20 2245 05/06/20 2245 05/07/20 0607 05/08/20 0527 05/08/20 0721 05/09/20 0401  NA 141  --  140 137  --  137  K 4.5  --  4.2 5.1  --  4.6  CL 105  --  111 102  --  99  CO2 19*  --  14* 19*  --  26  GLUCOSE 104*  --  90 124*  --  78  BUN 54*  --  60* 73*  --  34*  CREATININE 13.49*  --  12.27* 14.20*  --  8.42*  CALCIUM 9.6   < > 8.4* 8.6*  --  8.5*  MG  --   --  2.0 2.0  --  2.7*  PHOS  --   --  4.7* 7.0* 7.0* 4.3   < > = values in this interval not displayed.    Liver Function Tests: Recent Labs  Lab 05/06/20 2245  AST 26  ALT 10  ALKPHOS 57  BILITOT 1.0  PROT 7.6  ALBUMIN 3.8   No results for input(s): LIPASE, AMYLASE in the last 168 hours. No results for input(s): AMMONIA in the last 168  hours.  CBC: Recent Labs  Lab 05/06/20 0023 05/06/20 0023 05/06/20 2245 05/07/20 0344 05/08/20 0527 05/09/20 0401 05/10/20 0721  WBC 10.6*  --  7.3 8.4 8.0 6.4  --   NEUTROABS  --   --  6.4  --   --   --   --   HGB 12.6   < > 11.8* 12.6 9.3* 7.3* 7.7*  HCT 40.3   < > 37.2 39.9 27.3* 21.5* 24.2*  MCV 98.1  --  96.9 95.7 90.7 91.9  --   PLT 51*  --  62* 45* 46* 35*  --    < > = values in this interval not displayed.    Cardiac Enzymes: No results for input(s): CKTOTAL, CKMB, CKMBINDEX, TROPONINI in the last 168 hours.  BNP: Invalid input(s): POCBNP  CBG: No results for input(s): GLUCAP in the last 168 hours.  Microbiology: Results for orders placed or performed during the hospital encounter of 05/06/20  SARS Coronavirus 2 by RT PCR (hospital order, performed in [facility] hospital lab) Nasopharyngeal Nasopharyngeal Swab     Status: None  Collection Time: 05/06/20 10:45 PM   Specimen: Nasopharyngeal Swab  Result Value Ref Range Status   SARS Coronavirus 2 NEGATIVE NEGATIVE Final    Comment: (NOTE) SARS-CoV-2 target nucleic acids are NOT DETECTED.  The SARS-CoV-2 RNA is generally detectable in upper and lower respiratory specimens during the acute phase of infection. The lowest concentration of SARS-CoV-2 viral copies this assay can detect is 250 copies / mL. A negative result does not preclude SARS-CoV-2 infection and should not be used as the sole basis for treatment or other patient management decisions.  A negative result may occur with improper specimen collection / handling, submission of specimen other than nasopharyngeal swab, presence of viral mutation(s) within the areas targeted by this assay, and inadequate number of viral copies (<250 copies / mL). A negative result must be combined with clinical observations, patient history, and epidemiological information.  Fact Sheet for Patients:   StrictlyIdeas.no  Fact Sheet for  Healthcare Providers: BankingDealers.co.za  This test is not yet approved or  cleared by the [place] FDA and has been authorized for detection and/or diagnosis of SARS-CoV-2 by FDA under an Emergency Use Authorization (EUA).  This EUA will remain in effect (meaning this test can be used) for the duration of the COVID-19 declaration under Section 564(b)(1) of the Act, 21 U.S.C. section 360bbb-3(b)(1), unless the authorization is terminated or revoked sooner.  Performed at [facility], [address] [place], [place] [address]   Culture, Urine     Status: Abnormal   Collection Time: 05/07/20  3:44 AM   Specimen: Urine, Random  Result Value Ref Range Status   Specimen Description   Final    URINE, RANDOM Performed at [facility], [address], [place], [place] [address]    Special Requests   Final    NONE Performed at [facility], [address] [place], [place] [address]    Culture (A)  Final    <10,000 COLONIES/mL INSIGNIFICANT GROWTH Performed at [facility], [place] [address] [place], [place] [address]    Report Status 05/08/2020 FINAL  Final    Coagulation Studies: No results for input(s): LABPROT, INR in the last 72 hours.  Urinalysis: No results for input(s): COLORURINE, LABSPEC, PHURINE, GLUCOSEU, HGBUR, BILIRUBINUR, KETONESUR, PROTEINUR, UROBILINOGEN, NITRITE, LEUKOCYTESUR in the last 72 hours.  Invalid input(s): APPERANCEUR    Imaging: No results found.   Medications:   . dexmedetomidine (PRECEDEX) IV infusion Stopped (05/07/20 0439)  . levETIRAcetam 500 mg (05/10/20 1043)  . levETIRAcetam    . niCARDipine Stopped (05/07/20 0457)   . sodium chloride   Intravenous Once  . carvedilol  12.5 mg Oral BID WC  . Chlorhexidine Gluconate Cloth  6 each Topical Q0600  . cinacalcet  30 mg Oral Q supper  . irbesartan  150 mg Oral Daily  . losartan  50 mg Oral Daily  . multivitamin  1 tablet Oral  QHS  . NIFEdipine  90 mg Oral Daily  . sevelamer carbonate  1,600 mg Oral TID WC   acetaminophen, docusate sodium, hydrALAZINE, LORazepam, ondansetron (ZOFRAN) IV, polyethylene glycol, promethazine  Assessment/ Plan:    Ms. DAYRA RAPLEY is a 39 y.o. 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  RIJ permcath 41kg  1. End Stage Renal Disease:  - Next scheduled treatment for Monday.   2. Hypertension: with emergency and PRES during this admission. Required nicardipine gtt.  - Restarted home blood pressure regimen of Valsartan, nifedipine and carvedilol.   3. Anemia of chronic kidney disease: status post PRBC transfusion on 8/7.  - ESA as outpatient.   4. Secondary Hyperparathyroidism: with hyperphosphatemia.  - Continue sevelamer and cinacalcet.   LOS: 4 Emilygrace Grothe 8/8/202112:01 PM

## 2020-05-10 NOTE — Discharge Summary (Signed)
Physician Discharge Summary  Patricia Maldonado YBO:[id number] DOB: 27-Nov-1980 DOA: 05/06/2020  PCP: Letta Median, MD  Admit date: 05/06/2020 Discharge date: 05/10/2020  Time spent: 20  minutes  Recommendations for Outpatient Follow-up:  PCP as scheduled. Nephrology and Dialysis MWF. Hematology as advised.   Discharge Diagnoses:  Principal Problem:   Seizures ([place]) Active Problems:   PE (pulmonary thromboembolism) ([place])   ESRD on hemodialysis ([place])   Essential hypertension   Thrombocytopenia (HCC)   Blurred vision, bilateral  Seizures (HCC)/PRES: -seizure prophylaxis and precaution. -continue keppra. -continue with routine scheduled dialysis. -appreciate consult. -pretreatment with compazine.    ESRD on hemodialysis [facility]): -Dialysis per nephrology, appreciate consult.  -d/w pt about renal diet and sodium restriction. -Pt advised to keep her dialysis sch M/W/F - advised  complaince with reanl diet.      Essential hypertension: -Losartan started.  -renal /soidum, restricted diet.  - d/c plan with Diovan per nephrology.     Thrombocytopenia (HCC)/ Anemia: -Pt has been anemic and thrombocytopenic since about 2017 on chart review. -We will request hematology consult. -outpatient hematology consult.    Blurred vision, bilateral: -Pt reports blurred vision but describes floaters and may be due BP or uremia. -Have advised ophthalmology appt as outpatient.    Discharge Condition:  Stable.  Diet recommendation:  Renal/ Cardiac.   Filed Weights   05/08/20 0607 05/09/20 0506 05/10/20 0306  Weight: 45 kg 45.5 kg 44.9 kg    History of present illness:  Patricia Maldonado is a 39 year old female with a past medical history significant for ESRD on HD (Monday, Wednesday, Friday), seizure disorder, hypertension, and lupus who presented to [facility] ED on 05/06/2020 following a witnessed seizure.  Patient is currently in the postictal state and sedated with no family present,  therefore history is obtained from ED and nursing notes.  Per notes EMS was dispatched due to the patient feeling malaise throughout the day with headache and nausea, of which she missed her scheduled hemodialysis treatment today (reportedly received HD 2 days ago).  Shortly after the fire department arrived, she had a witnessed 30-60 second generalized tonic-clonic seizure.  She required 1 mg of Versed x2, but remained agitated during transport.  Upon arrival to the ED she remained confused, agitated, combative, and trying to pull out her Dialysis catheter.  She was noted to be markedly hypertensive with blood pressure 218/124.  She was given Ativan without improvement in agitation,  subsequently given IM Ketamine in order to facilitate medical treatment, and due to risk to herself and medical staff.  She was also given labetalol without improvement in BP, therefore was placed on Nicardipine drip.  Initial work-up in the ED revealed WBC 7.3, hemoglobin 11.8, platelets 62, high-sensitivity troponin 19, hCG normal, bicarb 19, BUN 54, creatinine 13.49.  CT head is negative for any acute intracranial abnormality.  Chest x-ray is negative.  Her COVID-19 PCR is negative.  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  Nephrology and Neurology have been consulted.   Hospital Course:  She presented to the ED on 05/06/2020 with a brief seizure lasting 30 to 60 seconds which was witnessed by EMS.  EMS stated that they were called after patient had been feeling malaise throughout the day with nausea and headache.  Patient had missed her dialysis appointment it was unclear if patient had been taking her Keppra  as prescribed. Pt was stabilized in ICU and nephrology  And neurology consulted. 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 Pt also has  reports of headaches prior to dialysis. She tolerated compazine prior to dialysis and will be given home script. Pt's hemoglobin dropped to below 8 and per history she says she has never been told of anemia  And heme consult advised.   Procedures:  Blood transfusion .  Consultations:  Nephrology-[facility] dialysis.  Neurology-  Discharge Exam: Vitals:   05/10/20 0753 05/10/20 1252  BP: (!) 143/99 (!) 158/110  Pulse: 88 90  Resp: 17 18  Temp: 97.7 F (36.5 C) (!) 97.5 F (36.4 C)  SpO2: 99% 100%  Physical Exam Vitals reviewed.  HENT:     Head: Normocephalic and atraumatic.     Right Ear: External ear normal.     Left Ear: External ear normal.     Mouth/Throat:     Mouth: Mucous membranes are moist.  Eyes:     Extraocular Movements: Extraocular movements intact.  Cardiovascular:     Rate and Rhythm: Normal rate and regular rhythm.     Pulses: Normal pulses.     Heart sounds: Normal heart sounds.  Pulmonary:     Effort: Pulmonary effort is normal.     Breath sounds: Normal breath sounds.  Abdominal:     General: Bowel sounds are normal.     Palpations: Abdomen is soft.  Skin:    General: Skin is warm.  Neurological:     General: No focal deficit present.     Mental Status: She is alert and oriented to person, place, and time.  Psychiatric:        Mood and Affect: Mood normal.    Discharge Instructions  Discharge Instructions    Call MD for:  difficulty breathing, headache or visual disturbances   Complete by: As directed    Call MD for:  extreme fatigue   Complete by: As directed    Call MD for:  hives   Complete by: As directed    Call MD for:  persistant dizziness or light-headedness   Complete by: As directed    Call MD for:  persistant nausea and vomiting   Complete by: As directed    Call MD for:  redness, tenderness, or signs of infection (pain, swelling, redness, odor or green/yellow discharge around incision site)   Complete by: As directed    Call MD  for:  severe uncontrolled pain   Complete by: As directed    Call MD for:  temperature >100.4   Complete by: As directed    Diet - low sodium heart healthy   Complete by: As directed    RENAL DIET.   Discharge instructions   Complete by: As directed    Please follow all appt and dialysis appt tomorrow.  Please take all meds as prescribed.   Increase activity slowly   Complete by: As directed      Allergies as of 05/10/2020      Reactions   Penicillins Swelling   Yeast infection Has patient had a PCN reaction causing immediate rash, facial/tongue/throat swelling, SOB or lightheadedness with hypotension:No Has patient had a PCN reaction causing severe rash involving mucus membranes or skin necrosis: Yes Has patient had a PCN reaction that required hospitalization No Has patient had a PCN reaction occurring within the last 10 years: No If all of the above answers are "NO", then may proceed with Cephalosporin use.  Yeast infection Has patient had a PCN reaction causing immediate rash, facial/tongue/throat swelling, SOB or lightheadedness with hypotension:No Has patient had a PCN reaction causing severe rash involving mucus membranes or skin necrosis: Yes Has patient had a PCN reaction that required hospitalization No Has patient had a PCN reaction occurring within the last 10 years: No If all of the above answers are "NO", then may proceed with Cephalosporin use.      Medication List    STOP taking these medications   losartan 50 MG tablet Commonly known as: COZAAR     TAKE these medications   calcitRIOL 0.5 MCG capsule Commonly known as: ROCALTROL 2 Capsule(s) By Mouth Daily   carvedilol 12.5 MG tablet Commonly known as: COREG tablet Take 3 tablets (37.5 mg total) by mouth two (2) times a day   cinacalcet 30 MG tablet Commonly known as: SENSIPAR Take 30 mg by mouth daily.   citalopram 10 MG tablet Commonly known as: CELEXA Take 10 mg by mouth daily.   gentamicin  cream 0.1 % Commonly known as: GARAMYCIN APPLY TO EXIT SITE DAILY   hydroxychloroquine 200 MG tablet Commonly known as: PLAQUENIL   hydroxyurea 200 MG capsule Commonly known as: DROXIA Take 200 mg by mouth daily.   levETIRAcetam 500 MG tablet Commonly known as: KEPPRA TAKE 1 TABLET BY MOUTH TWICE DAILY WITH ADDITIONAL 1 TABLET AFTER DIALYSIS   multivitamin Tabs tablet Take 1 tablet by mouth at bedtime.   mycophenolate 500 MG tablet Commonly known as: CellCept Take 2 tablets (1,000 mg total) by mouth 2 (two) times daily.   NIFEdipine 90 MG 24 hr tablet Commonly known as: PROCARDIA XL/NIFEDICAL-XL Take 90 mg by mouth daily.   ondansetron 4 MG tablet Commonly known as: ZOFRAN Take 4 mg by mouth 2 (two) times daily as needed.   prochlorperazine 10 MG tablet Commonly known as: COMPAZINE Take 1 tablet (10 mg total) by mouth every 6 (six) hours as needed for up to 15 doses (30 Min prior to dialysis.).   sevelamer 800 MG tablet Commonly known as: RENAGEL Take by mouth 3 (three) times daily with meals.   valsartan 160 MG tablet Commonly known as: DIOVAN Take 160 mg by mouth daily.      Allergies  Allergen Reactions  . Penicillins Swelling    Yeast infection Has patient had a PCN reaction causing immediate rash, facial/tongue/throat swelling, SOB or lightheadedness with hypotension:No Has patient had a PCN reaction causing severe rash involving mucus membranes or skin necrosis: Yes Has patient had a PCN reaction that required hospitalization No Has patient had a PCN reaction occurring within the last 10 years: No If all of the above answers are "NO", then may proceed with Cephalosporin use.  Yeast infection Has patient had a PCN reaction causing immediate rash, facial/tongue/throat swelling, SOB or lightheadedness with hypotension:No Has patient had a PCN reaction causing severe rash involving mucus membranes or skin necrosis: Yes Has patient had a PCN reaction that  required hospitalization No Has patient had a PCN reaction occurring within the last 10 years: No If all of the above answers are "NO", then may proceed with Cephalosporin use.    Follow-up Information    Bender, Durene Cal, MD Follow up in 1 week(s).   Specialty: Family Medicine Contact information: [place] [address] [phone]        Lequita Asal, MD Follow up in 1 week(s).   Specialty: Hematology and Oncology Why: Appt for anemia  and thrombocytopenia.  Contact information: [place] [address] [phone]                The results of significant diagnostics from this hospitalization (including imaging, microbiology, ancillary and laboratory) are listed below for reference.    Significant Diagnostic Studies: CT Head Wo Contrast  Result Date: 05/06/2020 CLINICAL DATA:  Seizure. EXAM: CT HEAD WITHOUT CONTRAST TECHNIQUE: Contiguous axial images were obtained from the base of the skull through the vertex without intravenous contrast. COMPARISON:  December 23, 2019 FINDINGS: Brain: No evidence of acute infarction, hemorrhage, hydrocephalus, extra-axial collection or mass lesion/mass effect. Vascular: No hyperdense vessel or unexpected calcification. Skull: Normal. Negative for fracture or focal lesion. Sinuses/Orbits: No acute finding. Other: None. IMPRESSION: No acute intracranial pathology. Electronically Signed   By: Virgina Norfolk M.D.   On: 05/06/2020 23:02   MR BRAIN WO CONTRAST  Result Date: 05/08/2020 CLINICAL DATA:  Encephalopathy.  Lupus nephritis. EXAM: MRI HEAD WITHOUT CONTRAST TECHNIQUE: Multiplanar, multiecho pulse sequences of the brain and surrounding structures were obtained without intravenous contrast. COMPARISON:  12/23/2019 FINDINGS: Brain: No acute infarct, acute hemorrhage or extra-axial collection. There are multiple areas of subcortical hyperintense T2-weighted signal within posterior  parietal lobes, the left greater than right occipital lobes and cerebellum. The supratentorial lesions are less numerous than on the prior study, but the cerebellar lesions are larger. Normal volume of CSF spaces. No chronic microhemorrhage. Normal midline structures. Vascular: Normal flow voids. Skull and upper cervical spine: Normal marrow signal. Sinuses/Orbits: Negative. Other: None. IMPRESSION: 1. Multiple areas of subcortical hyperintense T2-weighted signal within the posterior parietal lobes, left greater than right occipital lobes and cerebellum. 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 The extent of supratentorial involvement is decreased relative to the prior study. 2. No acute ischemia or hemorrhage. Electronically Signed   By: Ulyses Jarred M.D.   On: 05/08/2020 03:07   DG Chest Portable 1 View  Result Date: 05/06/2020 CLINICAL DATA:  Tunneled dialysis catheter placement. EXAM: PORTABLE CHEST 1 VIEW COMPARISON:  07/27/2017 FINDINGS: There is a right-sided dialysis catheter with tip terminating over the right atrium. The heart size is stable but mildly enlarged. There is no pneumothorax. No significant pleural effusion. No focal infiltrate. No acute osseous abnormality. IMPRESSION: No active disease. Electronically Signed   By: Constance Holster M.D.   On: 05/06/2020 22:52    Microbiology: Recent Results (from the past 240 hour(s))  SARS Coronavirus 2 by RT PCR (hospital order, performed in [facility] hospital lab) Nasopharyngeal Nasopharyngeal Swab     Status: None   Collection Time: 05/06/20 10:45 PM   Specimen: Nasopharyngeal Swab  Result Value Ref Range Status   SARS Coronavirus 2 NEGATIVE NEGATIVE Final    Comment: (NOTE) SARS-CoV-2 target nucleic acids are NOT DETECTED.  The SARS-CoV-2 RNA is generally detectable in upper and lower respiratory specimens during the acute phase of infection. The lowest concentration of SARS-CoV-2 viral copies  this assay can detect is 250 copies / mL. A negative result does not preclude SARS-CoV-2 infection and should not be used as the sole basis for treatment or other patient management decisions.  A negative result may occur with improper specimen collection / handling, submission of specimen other than nasopharyngeal swab, presence of viral mutation(s) within the areas targeted by this assay, and inadequate number of viral copies (<250 copies / mL). A negative result must be combined with clinical observations, patient history, and epidemiological information.  Fact Sheet  for Patients:   StrictlyIdeas.no  Fact Sheet for Healthcare Providers: BankingDealers.co.za  This test is not yet approved or  cleared by the [place] FDA and has been authorized for detection and/or diagnosis of SARS-CoV-2 by FDA under an Emergency Use Authorization (EUA).  This EUA will remain in effect (meaning this test can be used) for the duration of the COVID-19 declaration under Section 564(b)(1) of the Act, 21 U.S.C. section 360bbb-3(b)(1), unless the authorization is terminated or revoked sooner.  Performed at [facility], [address] [place], [place] [address]   Culture, Urine     Status: Abnormal   Collection Time: 05/07/20  3:44 AM   Specimen: Urine, Random  Result Value Ref Range Status   Specimen Description   Final    URINE, RANDOM Performed at [facility], [address], [place], [place] [address]    Special Requests   Final    NONE Performed at [facility], [address] [place],  [address]    Culture (A)  Final    <10,000 COLONIES/mL INSIGNIFICANT GROWTH Performed at [facility], [place] [address] [place],  [address]    Report Status 05/08/2020 FINAL  Final     Labs: Basic Metabolic Panel: Recent Labs  Lab 05/06/20 2245 05/07/20 0607 05/08/20 0527 05/08/20 0721  05/09/20 0401  NA 141 140 137  --  137  K 4.5 4.2 5.1  --  4.6  CL 105 111 102  --  99  CO2 19* 14* 19*  --  26  GLUCOSE 104* 90 124*  --  78  BUN 54* 60* 73*  --  34*  CREATININE 13.49* 12.27* 14.20*  --  8.42*  CALCIUM 9.6 8.4* 8.6*  --  8.5*  MG  --  2.0 2.0  --  2.7*  PHOS  --  4.7* 7.0* 7.0* 4.3   Liver Function Tests: Recent Labs  Lab 05/06/20 2245  AST 26  ALT 10  ALKPHOS 57  BILITOT 1.0  PROT 7.6  ALBUMIN 3.8   No results for input(s): LIPASE, AMYLASE in the last 168 hours. No results for input(s): AMMONIA in the last 168 hours. CBC: Recent Labs  Lab 05/06/20 0023 05/06/20 0023 05/06/20 2245 05/07/20 0344 05/08/20 0527 05/09/20 0401 05/10/20 0721  WBC 10.6*  --  7.3 8.4 8.0 6.4  --   NEUTROABS  --   --  6.4  --   --   --   --   HGB 12.6   < > 11.8* 12.6 9.3* 7.3* 7.7*  HCT 40.3   < > 37.2 39.9 27.3* 21.5* 24.2*  MCV 98.1  --  96.9 95.7 90.7 91.9  --   PLT 51*  --  62* 45* 46* 35*  --    < > = values in this interval not displayed.    Signed:  Para Skeans MD.  [facility] 05/10/2020, 1:19 PM

## 2020-05-11 LAB — GLUCOSE, CAPILLARY: Glucose-Capillary: 86 mg/dL (ref 70–99)

## 2020-05-14 ENCOUNTER — Encounter: Payer: Self-pay | Admitting: Internal Medicine

## 2020-06-18 ENCOUNTER — Ambulatory Visit
Admission: RE | Admit: 2020-06-18 | Discharge: 2020-06-18 | Disposition: A | Discharge status: Home / Self Care | Payer: Medicaid Other | Source: Ambulatory Visit | Attending: Nephrology | Admitting: Nephrology

## 2020-06-18 ENCOUNTER — Other Ambulatory Visit: Payer: Self-pay

## 2020-06-18 DIAGNOSIS — D649 Anemia, unspecified: Secondary | ICD-10-CM | POA: Diagnosis not present

## 2020-06-18 LAB — PREPARE RBC (CROSSMATCH)

## 2020-06-18 MED ORDER — SODIUM CHLORIDE 0.9% IV SOLUTION: Freq: Once | INTRAVENOUS | Status: AC

## 2020-06-18 MED ORDER — ACETAMINOPHEN 325 MG PO TABS
650.0000 mg | ORAL_TABLET | Freq: Once | ORAL | Status: AC
  Administered 2020-06-18: 650 mg via ORAL

## 2020-06-18 MED ORDER — ACETAMINOPHEN 325 MG PO TABS
ORAL_TABLET | ORAL | Status: AC
  Filled 2020-06-18: qty 2

## 2020-06-19 LAB — BPAM RBC
Blood Product Expiration Date: 202110112359
ISSUE DATE / TIME: 202109160921
Unit Type and Rh: 7300

## 2020-06-19 LAB — TYPE AND SCREEN
ABO/RH(D): B POS
Antibody Screen: NEGATIVE
Unit division: 0

## 2020-06-30 ENCOUNTER — Telehealth (INDEPENDENT_AMBULATORY_CARE_PROVIDER_SITE_OTHER): Payer: Self-pay

## 2020-06-30 NOTE — Telephone Encounter (Signed)
I received a fax for the patient to have a permcath exchange from [facility]. Per our last exchange with the patient she was going to [facility], I called and spoke with Baldo Ash to clarify if the patient wants to her procedure with our office and was given the answer that she does. The patient has been scheduled with Dr. Lucky Cowboy for a permcath exchange on 07/02/20 with a 11:45 am arrival time to the [facility]. Covid testing on 07/01/20 between 8-1 pm at the [facility]. Pre-procedure instructions will be faxed back to attention Baldo Ash at [facility].

## 2020-07-01 ENCOUNTER — Other Ambulatory Visit: Admission: RE | Admit: 2020-07-01 | Payer: Medicaid Other | Source: Ambulatory Visit

## 2020-07-01 ENCOUNTER — Other Ambulatory Visit (INDEPENDENT_AMBULATORY_CARE_PROVIDER_SITE_OTHER): Payer: Self-pay | Admitting: Nurse Practitioner

## 2020-07-02 NOTE — Telephone Encounter (Signed)
Patient called yesterday due to missing her covid testing and needed to be rescheduled. The patient was offered 07/06/20 but declined stating she would be at dialysis, I attempted to explain that without her permcath working properly she will not be able to dialysis. The patient was adamant about not having a procedure on Monday. Patient was scheduled for 07/07/20 with Dr. Delana Meyer with a 1:00 pm arrival time to the [facility] and covid testing on 07/03/20 between 8-1 pm. Patients center called asking about the procedure and if she had it and I explained that she did not get her covid test and has been rescheduled to 07/07/20 with Dr. Delana Meyer. It was stated that prior to the appt she was told she couldn't dialyze.  The patient  has not had dialysis since last week and they explained to the patient that she needs to have a permcath exchange in order to dialyze. They will call back if they can get her to agree to have her procedure on Monday  07/03/20.

## 2020-07-03 ENCOUNTER — Other Ambulatory Visit: Admission: RE | Admit: 2020-07-03 | Payer: Medicaid Other | Source: Ambulatory Visit

## 2020-07-04 ENCOUNTER — Emergency Department
Admission: EM | Admit: 2020-07-04 | Discharge: 2020-07-04 | Disposition: A | Discharge status: Home / Self Care | Payer: Medicaid Other | Attending: Emergency Medicine | Admitting: Emergency Medicine

## 2020-07-04 ENCOUNTER — Emergency Department: Payer: Medicaid Other

## 2020-07-04 ENCOUNTER — Other Ambulatory Visit: Payer: Self-pay

## 2020-07-04 ENCOUNTER — Encounter: Payer: Self-pay | Admitting: Emergency Medicine

## 2020-07-04 DIAGNOSIS — N3000 Acute cystitis without hematuria: Secondary | ICD-10-CM | POA: Diagnosis not present

## 2020-07-04 DIAGNOSIS — R197 Diarrhea, unspecified: Secondary | ICD-10-CM | POA: Diagnosis not present

## 2020-07-04 DIAGNOSIS — T8249XA Other complication of vascular dialysis catheter, initial encounter: Secondary | ICD-10-CM | POA: Insufficient documentation

## 2020-07-04 DIAGNOSIS — Y828 Other medical devices associated with adverse incidents: Secondary | ICD-10-CM | POA: Insufficient documentation

## 2020-07-04 DIAGNOSIS — T829XXA Unspecified complication of cardiac and vascular prosthetic device, implant and graft, initial encounter: Secondary | ICD-10-CM

## 2020-07-04 DIAGNOSIS — N3090 Cystitis, unspecified without hematuria: Secondary | ICD-10-CM

## 2020-07-04 DIAGNOSIS — Z20822 Contact with and (suspected) exposure to covid-19: Secondary | ICD-10-CM | POA: Diagnosis not present

## 2020-07-04 LAB — BASIC METABOLIC PANEL
Anion gap: 16 — ABNORMAL HIGH (ref 5–15)
BUN: 70 mg/dL — ABNORMAL HIGH (ref 6–20)
CO2: 18 mmol/L — ABNORMAL LOW (ref 22–32)
Calcium: 9.6 mg/dL (ref 8.9–10.3)
Chloride: 102 mmol/L (ref 98–111)
Creatinine, Ser: 20.12 mg/dL — ABNORMAL HIGH (ref 0.44–1.00)
GFR calc Af Amer: 2 mL/min — ABNORMAL LOW (ref >60)
GFR calc non Af Amer: 2 mL/min — ABNORMAL LOW (ref >60)
Glucose, Bld: 86 mg/dL (ref 70–99)
Potassium: 3.7 mmol/L (ref 3.5–5.1)
Sodium: 136 mmol/L (ref 135–145)

## 2020-07-04 LAB — HEPATIC FUNCTION PANEL
ALT: 6 U/L (ref 0–44)
AST: 20 U/L (ref 15–41)
Albumin: 3.9 g/dL (ref 3.5–5.0)
Alkaline Phosphatase: 49 U/L (ref 38–126)
Bilirubin, Direct: 0.3 mg/dL — ABNORMAL HIGH (ref 0.0–0.2)
Indirect Bilirubin: 0.6 mg/dL (ref 0.3–0.9)
Total Bilirubin: 0.9 mg/dL (ref 0.3–1.2)
Total Protein: 7.7 g/dL (ref 6.5–8.1)

## 2020-07-04 LAB — CBC
HCT: 28.1 % — ABNORMAL LOW (ref 36.0–46.0)
Hemoglobin: 8.8 g/dL — ABNORMAL LOW (ref 12.0–15.0)
MCH: 29.1 pg (ref 26.0–34.0)
MCHC: 31.3 g/dL (ref 30.0–36.0)
MCV: 93 fL (ref 80.0–100.0)
Platelets: 120 10*3/uL — ABNORMAL LOW (ref 150–400)
RBC: 3.02 MIL/uL — ABNORMAL LOW (ref 3.87–5.11)
RDW: 20.6 % — ABNORMAL HIGH (ref 11.5–15.5)
WBC: 6 10*3/uL (ref 4.0–10.5)
nRBC: 0 % (ref 0.0–0.2)

## 2020-07-04 LAB — RESPIRATORY PANEL BY RT PCR (FLU A&B, COVID)
Influenza A by PCR: NEGATIVE
Influenza B by PCR: NEGATIVE
SARS Coronavirus 2 by RT PCR: NEGATIVE

## 2020-07-04 LAB — LIPASE, BLOOD: Lipase: 34 U/L (ref 11–51)

## 2020-07-04 LAB — URINALYSIS, COMPLETE (UACMP) WITH MICROSCOPIC
Bilirubin Urine: NEGATIVE
Glucose, UA: NEGATIVE mg/dL
Ketones, ur: NEGATIVE mg/dL
Nitrite: NEGATIVE
Protein, ur: >=300 mg/dL — AB
Specific Gravity, Urine: 1.013 (ref 1.005–1.030)
WBC, UA: >50 WBC/hpf — ABNORMAL HIGH (ref 0–5)
pH: 6 (ref 5.0–8.0)

## 2020-07-04 LAB — POC URINE PREG, ED: Preg Test, Ur: NEGATIVE

## 2020-07-04 MED ORDER — CEPHALEXIN 250 MG PO CAPS: 250.0000 mg | ORAL_CAPSULE | Freq: Two times a day (BID) | ORAL | 0 refills | Status: AC

## 2020-07-04 MED ORDER — ONDANSETRON 4 MG PO TBDP
4.0000 mg | ORAL_TABLET | Freq: Once | ORAL | Status: AC
  Administered 2020-07-04: 4 mg via ORAL
  Filled 2020-07-04: qty 1

## 2020-07-04 MED ORDER — ONDANSETRON HCL 4 MG PO TABS: 4.0000 mg | ORAL_TABLET | Freq: Every day | ORAL | 0 refills | Status: DC | PRN

## 2020-07-04 MED ORDER — CEPHALEXIN 250 MG PO CAPS
250.0000 mg | ORAL_CAPSULE | Freq: Once | ORAL | Status: AC
  Administered 2020-07-04: 250 mg via ORAL
  Filled 2020-07-04: qty 1

## 2020-07-04 NOTE — Discharge Instructions (Addendum)
Patient was scheduled for 07/07/20 with Dr. Delana Meyer with a 1:00 pm arrival

## 2020-07-04 NOTE — ED Provider Notes (Signed)
[facility] Emergency Department Provider Note  ____________________________________________  Time seen: Approximately 6:45 PM  I have reviewed the triage vital signs and the nursing notes.   HISTORY  Chief Complaint missed dialysis    HPI Patricia Maldonado is a 39 y.o. female that presents to the emergency department requesting dialysis cath replacement.  Patient states that she has not had dialysis for 1 week.  She was told that she is unable to have dialysis until she has the catheter replacement.  She reports she was told to come to the emergency department for her cath replacement.  Patient also notes that she had several episodes of diarrhea yesterday and one episode this morning.  She had some mild central lower abdominal discomfort this afternoon but no further diarrhea.  She has also noticed decreased urination today.  No recent illness.  No fevers, shortness of breath, chest pain, vomiting.   Past Medical History:  Diagnosis Date  . Acute renal failure (ARF) ([place])   . Anemia   . Arthritis   . History of kidney stones   . Hypertension    OFF BP MEDS X 3 WEEKS DUE TO BP CONTROL  . 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 Rash   . Staghorn calculus   . 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[place]) 10/2016   [place] ADMISSION    Patient Active Problem List   Diagnosis Date Noted  . Blurred vision, bilateral 05/08/2020  . Seizures ([place]) 05/06/2020  . Cerebral edema ([place]) 12/23/2019  . Seizure disorder ([place]) 12/20/2019  . Acute repetitive seizure ([place]) 12/19/2019  . History of Closed sacral fracture ([place]) 12/19/2019  . Thrombocytopenia ([place]) 12/19/2019  . Anemia, unspecified 04/28/2019  . ESRD on hemodialysis ([place]) 02/21/2019  . Essential hypertension 02/21/2019  . CKD (chronic kidney disease), stage V ([place]) 09/12/2018  . Metabolic acidosis 16/07/9603  . Secondary hyperparathyroidism ([place]) 09/12/2018  . Acute on chronic renal  failure ([place]) 04/26/2018  . Acute kidney injury ([place]) 07/27/2017  . Alopecia of scalp 03/08/2017  . Thyroid nodule 03/08/2017  . PE (pulmonary thromboembolism) ([place]) 03/01/2017  . Kidney stone on left side 01/23/2017  . Systemic lupus erythematosus ([place]) 12/08/2016  . Encounter for long-term (current) use of high-risk medication 12/08/2016  . 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 Acute renal failure (ARF) ([place]) 10/04/2016  . Nephrolithiasis   . Staghorn calculus   . Acute renal failure with tubular necrosis ([place]) 08/30/2016    Past Surgical History:  Procedure Laterality Date  . CYSTOSCOPY/URETEROSCOPY/HOLMIUM LASER/STENT PLACEMENT Right 02/21/2017   Procedure: CYSTOSCOPY/URETEROSCOPY/HOLMIUM LASER/STENT EXCHANGE;  Surgeon: [name], MD;  Location: [facility] ORS;  Service: Urology;  Laterality: Right;  . DIALYSIS/PERMA CATHETER INSERTION N/A 11/30/2018   Procedure: DIALYSIS/PERMA CATHETER INSERTION;  Surgeon: Algernon Huxley, MD;  Location: [facility];  Service: Cardiovascular;  Laterality: N/A;  . IR NEPHROSTOMY PLACEMENT RIGHT  01/23/2017  . NEPHROLITHOTOMY Right 01/24/2017   Procedure: NEPHROLITHOTOMY PERCUTANEOUS;  Surgeon: [name], MD;  Location: [facility] ORS;  Service: Urology;  Laterality: Right;  . PERITONEAL CATHETER INSERTION    . TUBAL LIGATION      Prior to Admission medications   Medication Sig Start Date End Date Taking? Authorizing Provider  calcitRIOL (ROCALTROL) 0.5 MCG capsule 2 Capsule(s) By Mouth Daily    [provider]  carvedilol (COREG) 12.5 MG tablet tablet Take 3 tablets (37.5 mg total) by mouth two (2) times a day 04/25/20  [provider]  cephALEXin (KEFLEX) 250 MG capsule Take 1 capsule (250 mg total) by mouth 2 (two) times daily for 7 days. 07/04/20 07/11/20  Laban Emperor, PA-C  cinacalcet (SENSIPAR) 30 MG tablet Take 30 mg by mouth daily.    [provider]  citalopram (CELEXA) 10 MG tablet  Take 10 mg by mouth daily.    [provider]  gentamicin cream (GARAMYCIN) 0.1 % APPLY TO EXIT SITE DAILY    [provider]  hydroxychloroquine (PLAQUENIL) 200 MG tablet  04/25/20   [provider]  hydroxyurea (DROXIA) 200 MG capsule Take 200 mg by mouth daily.    [provider]  levETIRAcetam (KEPPRA) 500 MG tablet TAKE 1 TABLET BY MOUTH TWICE DAILY WITH ADDITIONAL 1 TABLET AFTER DIALYSIS    [provider]  mycophenolate (CELLCEPT) 500 MG tablet Take 2 tablets (1,000 mg total) by mouth 2 (two) times daily. 04/30/18   Hillary Bow, MD  NIFEdipine (PROCARDIA XL/NIFEDICAL-XL) 90 MG 24 hr tablet Take 90 mg by mouth daily. 04/25/20   [provider]  ondansetron (ZOFRAN) 4 MG tablet Take 1 tablet (4 mg total) by mouth daily as needed for nausea or vomiting. 07/04/20 07/04/21  Laban Emperor, PA-C  prochlorperazine (COMPAZINE) 10 MG tablet Take 1 tablet (10 mg total) by mouth every 6 (six) hours as needed for up to 15 doses (30 Min prior to dialysis.). 05/10/20   Para Skeans, MD  sevelamer (RENAGEL) 800 MG tablet Take by mouth 3 (three) times daily with meals.    [provider]  valsartan (DIOVAN) 160 MG tablet Take 160 mg by mouth daily.    [provider]    Allergies Penicillins  Family History  Problem Relation Age of Onset  . Thyroid disease Mother   . Thyroid disease Father   . Diabetes Father     Social History Social History   Tobacco Use  . Smoking status: Current Every Day Smoker    Packs/day: 0.25    Years: 15.00    Pack years: 3.75    Types: Cigarettes  . Smokeless tobacco: Never Used  Vaping Use  . Vaping Use: Never used  Substance Use Topics  . Alcohol use: No  . Drug use: Yes    Types: Marijuana    Comment: OCC     Review of Systems  Constitutional: No fever/chills ENT: No upper respiratory complaints. Cardiovascular: No chest pain. Respiratory: No cough. No SOB. Gastrointestinal:  Positive for lower abdominal cramping. No nausea, no vomiting. Positive for diarrhea.  Genitourinary: Negative for dysuria. Musculoskeletal: Negative for musculoskeletal pain. Skin: Negative for rash, abrasions, lacerations, ecchymosis. Neurological: Negative for headaches   ____________________________________________   PHYSICAL EXAM:  VITAL SIGNS: ED Triage Vitals  Enc Vitals Group     BP 07/04/20 1301 (!) 141/115     Pulse Rate 07/04/20 1301 74     Resp 07/04/20 1301 18     Temp 07/04/20 1301 98.1 F (36.7 C)     Temp Source 07/04/20 1301 Oral     SpO2 07/04/20 1301 100 %     Weight 07/04/20 1250 99 lb (44.9 kg)     Height 07/04/20 1250 5\' 3"  (1.6 m)     Head Circumference --      Peak Flow --      Pain Score 07/04/20 1250 0     Pain Loc --      Pain Edu? --      Excl. in [place]? --  Constitutional: Alert and oriented. Well appearing and in no acute distress. Eyes: Conjunctivae are normal. PERRL. EOMI. Head: Atraumatic. ENT:      Ears:      Nose: No congestion/rhinnorhea.      Mouth/Throat: Mucous membranes are moist.  Neck: No stridor.   Cardiovascular: Normal rate, regular rhythm.  Good peripheral circulation. Respiratory: Normal respiratory effort without tachypnea or retractions. Lungs CTAB. Good air entry to the bases with no decreased or absent breath sounds. Gastrointestinal: Bowel sounds 4 quadrants. Soft and nontender to palpation. No guarding or rigidity. No palpable masses. No distention. No CVA tenderness. Musculoskeletal: Full range of motion to all extremities. No gross deformities appreciated. Neurologic:  Normal speech and language. No gross focal neurologic deficits are appreciated.  Skin:  Skin is warm, dry and intact. No rash noted. Psychiatric: Mood and affect are normal. Speech and behavior are normal. Patient exhibits appropriate insight and judgement.   ____________________________________________   LABS (all labs ordered are listed, but  only abnormal results are displayed)  Labs Reviewed  CBC - Abnormal; Notable for the following components:      Result Value   RBC 3.02 (*)    Hemoglobin 8.8 (*)    HCT 28.1 (*)    RDW 20.6 (*)    Platelets 120 (*)    All other components within normal limits  BASIC METABOLIC PANEL - Abnormal; Notable for the following components:   CO2 18 (*)    BUN 70 (*)    Creatinine, Ser 20.12 (*)    GFR calc non Af Amer 2 (*)    GFR calc Af Amer 2 (*)    Anion gap 16 (*)    All other components within normal limits  HEPATIC FUNCTION PANEL - Abnormal; Notable for the following components:   Bilirubin, Direct 0.3 (*)    All other components within normal limits  URINALYSIS, COMPLETE (UACMP) WITH MICROSCOPIC - Abnormal; Notable for the following components:   Color, Urine YELLOW (*)    APPearance CLOUDY (*)    Hgb urine dipstick SMALL (*)    Protein, ur >=300 (*)    Leukocytes,Ua LARGE (*)    WBC, UA >50 (*)    Bacteria, UA RARE (*)    All other components within normal limits  RESPIRATORY PANEL BY RT PCR (FLU A&B, COVID)  URINE CULTURE  LIPASE, BLOOD  POC URINE PREG, ED   ____________________________________________  EKG   ____________________________________________  RADIOLOGY Robinette Haines, personally viewed and evaluated these images (plain radiographs) as part of my medical decision making, as well as reviewing the written report by the radiologist.  CT Renal Stone Study  Result Date: 07/04/2020 CLINICAL DATA:  Dialysis catheter malfunction, diarrhea, lower abdominal pain EXAM: CT ABDOMEN AND PELVIS WITHOUT CONTRAST TECHNIQUE: Multidetector CT imaging of the abdomen and pelvis was performed following the standard protocol without IV contrast. COMPARISON:  10/04/2016 FINDINGS: Lower chest: No acute abnormality. Hepatobiliary: No solid liver abnormality is seen. No gallstones, gallbladder wall thickening, or biliary dilatation. Pancreas: Unremarkable. No pancreatic ductal  dilatation or surrounding inflammatory changes. Spleen: Normal in size without significant abnormality. Adrenals/Urinary Tract: Adrenal glands are unremarkable. Atrophic appearance of the kidneys. Small nonobstructive right renal calculi. No hydronephrosis. Bladder is unremarkable. Stomach/Bowel: Stomach is within normal limits. Appendix appears normal. No evidence of bowel wall thickening, distention, or inflammatory changes. Vascular/Lymphatic: Scattered aortic atherosclerosis. No enlarged abdominal or pelvic lymph nodes. Reproductive: No mass or other significant abnormality. Other: No abdominal wall hernia or  abnormality. Trace ascites throughout the abdomen and pelvis. Musculoskeletal: Renal osteodystrophy. IMPRESSION: 1. No acute noncontrast CT findings of the abdomen or pelvis to explain abdominal pain or diarrhea. 2. Trace nonspecific ascites throughout the abdomen and pelvis. 3. Atrophic appearance of the kidneys with small nonobstructive right renal calculi. No hydronephrosis. 4. Renal osteodystrophy. 5. Aortic Atherosclerosis (ICD10-I70.0) advanced for patient age. Electronically Signed   By: Eddie Candle M.D.   On: 07/04/2020 19:57    ____________________________________________    PROCEDURES  Procedure(s) performed:    Procedures    Medications  cephALEXin (KEFLEX) capsule 250 mg (has no administration in time range)  ondansetron (ZOFRAN-ODT) disintegrating tablet 4 mg (4 mg Oral Given 07/04/20 1921)     ____________________________________________   INITIAL IMPRESSION / ASSESSMENT AND PLAN / ED COURSE  Pertinent labs & imaging results that were available during my care of the patient were reviewed by me and considered in my medical decision making (see chart for details).  Review of the [place] CSRS was performed in accordance of the [place] prior to dispensing any controlled drugs.   Patient presents to emergency department for a dialysis catheter replacement.  Vital signs and  exam are reassuring.  On review of patient's chart, patient has an appointment scheduled in 3 days for her dialysis catheter at 1 PM.  Patient states that she was unaware of this appointment.  Patient also had some diarrhea yesterday and some mild lower abdominal discomfort today with decreased urination.  Urinalysis contributory to infection.  She will be started on Keflex for a urinary tract infection.  Case, lab work, CT scan was discussed with Dr. Joni Fears, who is in agreement with the plan of care for outpatient follow-up for her dialysis catheter replacement and treatment of UTI. patient will be discharged home with prescriptions for keflex. Patient is to follow up with PCP and vascular surgery as directed. Patient is given ED precautions to return to the ED for any worsening or new symptoms.   Niyla S Buehler was evaluated in Emergency Department on 07/04/2020 for the symptoms described in the history of present illness. She was evaluated in the context of the global COVID-19 pandemic, which necessitated consideration that the patient might be at risk for infection with the SARS-CoV-2 virus that causes COVID-19. Institutional protocols and algorithms that pertain to the evaluation of patients at risk for COVID-19 are in a state of rapid change based on information released by regulatory bodies including the CDC and federal and state organizations. These policies and algorithms were followed during the patient's care in the ED.  ____________________________________________  FINAL CLINICAL IMPRESSION(S) / ED DIAGNOSES  Final diagnoses:  Complication associated with dialysis catheter  Cystitis  Diarrhea, unspecified type      NEW MEDICATIONS STARTED DURING THIS VISIT:  ED Discharge Orders         Ordered    cephALEXin (KEFLEX) 250 MG capsule  2 times daily        07/04/20 2033    ondansetron (ZOFRAN) 4 MG tablet  Daily PRN        07/04/20 2033              This chart was dictated  using voice recognition software/Dragon. Despite best efforts to proofread, errors can occur which can change the meaning. Any change was purely unintentional.    Laban Emperor, PA-C 07/05/20 1903    Carrie Mew, MD 07/07/20 [phone]

## 2020-07-04 NOTE — ED Triage Notes (Signed)
Pt here to have dialysis catheter fixed.  Pt denies pain at this time. No fever.  Has not had dialysis in over a week, last on 06/26/20.

## 2020-07-06 ENCOUNTER — Telehealth (INDEPENDENT_AMBULATORY_CARE_PROVIDER_SITE_OTHER): Payer: Self-pay

## 2020-07-06 LAB — URINE CULTURE

## 2020-07-06 NOTE — Telephone Encounter (Signed)
Patricia Maldonado from specials called and stated the patient did not show for her covid test on Friday or today. Patient has been canceled for her procedure of a permcath insert for 07/07/20. I did contact [facility] to let them know what was going on. Per Patricia Maldonado at [facility] they have attempted to contact the patient this morning. Patient went to the ED on 07/04/20 stating she was told to do so, but did not say who gave her this information. Patient stated she was unaware of the appt scheduled for 07/07/20 with Dr. Delana Meyer although a phone call between the patient and myself was on 07/02/20. During the conversation it was noted that she was given the information  to get her covid testing on 07/03/20 and the procedure will be on 07/07/20. Patient was tested at the ED on 07/04/20 and was negative.

## 2020-07-07 ENCOUNTER — Other Ambulatory Visit: Payer: Self-pay

## 2020-07-07 ENCOUNTER — Encounter: Payer: Self-pay | Admitting: Vascular Surgery

## 2020-07-07 ENCOUNTER — Emergency Department: Payer: Medicaid Other

## 2020-07-07 ENCOUNTER — Encounter: Payer: Self-pay | Admitting: Registered Nurse

## 2020-07-07 ENCOUNTER — Emergency Department
Admission: EM | Admit: 2020-07-07 | Discharge: 2020-07-07 | Disposition: A | Discharge status: Home / Self Care | Payer: Medicaid Other | Attending: Emergency Medicine | Admitting: Emergency Medicine

## 2020-07-07 ENCOUNTER — Ambulatory Visit
Admission: RE | Admit: 2020-07-07 | Discharge: 2020-07-07 | Disposition: A | Discharge status: Home / Self Care | Payer: Medicaid Other | Attending: Vascular Surgery | Admitting: Vascular Surgery

## 2020-07-07 ENCOUNTER — Encounter
Admission: RE | Disposition: A | Discharge status: Home / Self Care | Payer: Self-pay | Source: Home / Self Care | Attending: Vascular Surgery

## 2020-07-07 DIAGNOSIS — Z880 Allergy status to penicillin: Secondary | ICD-10-CM | POA: Diagnosis not present

## 2020-07-07 DIAGNOSIS — Z4901 Encounter for fitting and adjustment of extracorporeal dialysis catheter: Secondary | ICD-10-CM | POA: Diagnosis present

## 2020-07-07 DIAGNOSIS — N186 End stage renal disease: Secondary | ICD-10-CM | POA: Diagnosis not present

## 2020-07-07 DIAGNOSIS — I120 Hypertensive chronic kidney disease with stage 5 chronic kidney disease or end stage renal disease: Secondary | ICD-10-CM | POA: Diagnosis not present

## 2020-07-07 DIAGNOSIS — Z79899 Other long term (current) drug therapy: Secondary | ICD-10-CM | POA: Diagnosis not present

## 2020-07-07 DIAGNOSIS — F17210 Nicotine dependence, cigarettes, uncomplicated: Secondary | ICD-10-CM | POA: Insufficient documentation

## 2020-07-07 DIAGNOSIS — T82898A Other specified complication of vascular prosthetic devices, implants and grafts, initial encounter: Secondary | ICD-10-CM | POA: Diagnosis not present

## 2020-07-07 DIAGNOSIS — Z992 Dependence on renal dialysis: Secondary | ICD-10-CM | POA: Insufficient documentation

## 2020-07-07 DIAGNOSIS — G8918 Other acute postprocedural pain: Secondary | ICD-10-CM

## 2020-07-07 DIAGNOSIS — T82838A Hemorrhage of vascular prosthetic devices, implants and grafts, initial encounter: Secondary | ICD-10-CM | POA: Insufficient documentation

## 2020-07-07 DIAGNOSIS — T148XXA Other injury of unspecified body region, initial encounter: Secondary | ICD-10-CM

## 2020-07-07 HISTORY — PX: DIALYSIS/PERMA CATHETER INSERTION: CATH118288

## 2020-07-07 LAB — CBC
HCT: 25.5 % — ABNORMAL LOW (ref 36.0–46.0)
Hemoglobin: 8.1 g/dL — ABNORMAL LOW (ref 12.0–15.0)
MCH: 29.2 pg (ref 26.0–34.0)
MCHC: 31.8 g/dL (ref 30.0–36.0)
MCV: 92.1 fL (ref 80.0–100.0)
Platelets: 133 10*3/uL — ABNORMAL LOW (ref 150–400)
RBC: 2.77 MIL/uL — ABNORMAL LOW (ref 3.87–5.11)
RDW: 19.9 % — ABNORMAL HIGH (ref 11.5–15.5)
WBC: 5.2 10*3/uL (ref 4.0–10.5)
nRBC: 0 % (ref 0.0–0.2)

## 2020-07-07 LAB — BASIC METABOLIC PANEL
Anion gap: 16 — ABNORMAL HIGH (ref 5–15)
BUN: 82 mg/dL — ABNORMAL HIGH (ref 6–20)
CO2: 17 mmol/L — ABNORMAL LOW (ref 22–32)
Calcium: 8.9 mg/dL (ref 8.9–10.3)
Chloride: 104 mmol/L (ref 98–111)
Creatinine, Ser: 23.26 mg/dL — ABNORMAL HIGH (ref 0.44–1.00)
GFR calc non Af Amer: 2 mL/min — ABNORMAL LOW (ref >60)
Glucose, Bld: 87 mg/dL (ref 70–99)
Potassium: 3.4 mmol/L — ABNORMAL LOW (ref 3.5–5.1)
Sodium: 137 mmol/L (ref 135–145)

## 2020-07-07 LAB — POTASSIUM (ARMC VASCULAR LAB ONLY): Potassium (ARMC vascular lab): 3.9 (ref 3.5–5.1)

## 2020-07-07 SURGERY — DIALYSIS/PERMA CATHETER INSERTION
Anesthesia: Moderate Sedation

## 2020-07-07 MED ORDER — ONDANSETRON HCL 4 MG/2ML IJ SOLN: 4.0000 mg | Freq: Four times a day (QID) | INTRAMUSCULAR | Status: DC | PRN

## 2020-07-07 MED ORDER — BACITRACIN-NEOMYCIN-POLYMYXIN 400-5-5000 EX OINT
TOPICAL_OINTMENT | CUTANEOUS | Status: AC
  Filled 2020-07-07: qty 1

## 2020-07-07 MED ORDER — MIDAZOLAM HCL 2 MG/2ML IJ SOLN
INTRAMUSCULAR | Status: DC | PRN
  Administered 2020-07-07: 1 mg via INTRAVENOUS
  Administered 2020-07-07: 2 mg via INTRAVENOUS

## 2020-07-07 MED ORDER — MIDAZOLAM HCL 2 MG/2ML IJ SOLN
INTRAMUSCULAR | Status: AC
  Filled 2020-07-07: qty 2

## 2020-07-07 MED ORDER — LABETALOL HCL 5 MG/ML IV SOLN
INTRAVENOUS | Status: DC | PRN
  Administered 2020-07-07: 10 mg via INTRAVENOUS

## 2020-07-07 MED ORDER — OXYCODONE-ACETAMINOPHEN 5-325 MG PO TABS
1.0000 | ORAL_TABLET | ORAL | Status: DC | PRN
  Administered 2020-07-07: 1 via ORAL
  Filled 2020-07-07: qty 1

## 2020-07-07 MED ORDER — HYDRALAZINE HCL 20 MG/ML IJ SOLN
INTRAMUSCULAR | Status: DC | PRN
  Administered 2020-07-07 (×2): 10 mg via INTRAVENOUS

## 2020-07-07 MED ORDER — FENTANYL CITRATE (PF) 100 MCG/2ML IJ SOLN
INTRAMUSCULAR | Status: DC | PRN
Start: 2020-07-07 — End: 2020-07-07
  Administered 2020-07-07: 25 ug via INTRAVENOUS
  Administered 2020-07-07: 50 ug via INTRAVENOUS

## 2020-07-07 MED ORDER — LIDOCAINE HCL (PF) 1 % IJ SOLN
INTRAMUSCULAR | Status: AC
  Administered 2020-07-07: 5 mL via INTRADERMAL
  Filled 2020-07-07: qty 5

## 2020-07-07 MED ORDER — CLINDAMYCIN PHOSPHATE 300 MG/50ML IV SOLN
INTRAVENOUS | Status: AC
  Administered 2020-07-07: 300 mg via INTRAVENOUS
  Filled 2020-07-07: qty 50

## 2020-07-07 MED ORDER — SODIUM CHLORIDE 0.9 % IV SOLN: INTRAVENOUS | Status: DC

## 2020-07-07 MED ORDER — METHYLPREDNISOLONE SODIUM SUCC 125 MG IJ SOLR: 125.0000 mg | Freq: Once | INTRAMUSCULAR | Status: DC | PRN

## 2020-07-07 MED ORDER — FENTANYL CITRATE (PF) 100 MCG/2ML IJ SOLN
INTRAMUSCULAR | Status: AC
  Filled 2020-07-07: qty 2

## 2020-07-07 MED ORDER — HYDROMORPHONE HCL 1 MG/ML IJ SOLN: 1.0000 mg | Freq: Once | INTRAMUSCULAR | Status: DC | PRN

## 2020-07-07 MED ORDER — LABETALOL HCL 5 MG/ML IV SOLN
INTRAVENOUS | Status: AC
  Filled 2020-07-07: qty 4

## 2020-07-07 MED ORDER — DIPHENHYDRAMINE HCL 50 MG/ML IJ SOLN: 50.0000 mg | Freq: Once | INTRAMUSCULAR | Status: DC | PRN

## 2020-07-07 MED ORDER — CLINDAMYCIN PHOSPHATE 300 MG/50ML IV SOLN: 300.0000 mg | Freq: Once | INTRAVENOUS | Status: AC

## 2020-07-07 MED ORDER — MIDAZOLAM HCL 2 MG/ML PO SYRP: 8.0000 mg | ORAL_SOLUTION | Freq: Once | ORAL | Status: DC | PRN

## 2020-07-07 MED ORDER — LIDOCAINE HCL (PF) 1 % IJ SOLN: 5.0000 mL | Freq: Once | INTRAMUSCULAR | Status: AC

## 2020-07-07 MED ORDER — FAMOTIDINE 20 MG PO TABS: 40.0000 mg | ORAL_TABLET | Freq: Once | ORAL | Status: DC | PRN

## 2020-07-07 MED ORDER — HYDRALAZINE HCL 20 MG/ML IJ SOLN
INTRAMUSCULAR | Status: AC
  Filled 2020-07-07: qty 1

## 2020-07-07 SURGICAL SUPPLY — 5 items
CATH PALINDROME-P 19CM W/VT (CATHETERS) ×2 IMPLANT
GUIDEWIRE SUPER STIFF .035X180 (WIRE) ×2 IMPLANT
PACK ANGIOGRAPHY (CUSTOM PROCEDURE TRAY) ×2 IMPLANT
SUT SILK 0 FSL (SUTURE) ×2 IMPLANT
TOWEL OR 17X26 4PK STRL BLUE (TOWEL DISPOSABLE) ×2 IMPLANT

## 2020-07-07 NOTE — ED Notes (Signed)
Patient discharged to home per MD order. Patient in stable condition, and deemed medically cleared by ED provider for discharge. Discharge instructions reviewed with patient/family using "Teach Back"; verbalized understanding of medication education and administration, and information about follow-up care. Denies further concerns. ° °

## 2020-07-07 NOTE — Discharge Instructions (Signed)
Tunneled Catheter Insertion, Care After This sheet gives you information about how to care for yourself after your procedure. Your health care provider may also give you more specific instructions. If you have problems or questions, contact your health care provider. What can I expect after the procedure? After the procedure, it is common to have:  Some mild redness, bruising, swelling, and pain around your catheter site.  A small amount of blood or clear fluid coming from your incisions. Follow these instructions at home: Incision care  1. Follow instructions from your health care provider about how to take care of your incisions. Make sure you: ? Wash your hands with soap and water before and after you change your bandages (dressings). If soap and water are not available, use hand sanitizer. ? Change your dressings as told by your health care provider. Wash the area around your incisions with a germ-killing (antiseptic) solution when you change your dressings. ? Leave stitches (sutures), skin glue, or adhesive strips in place. These skin closures may need to stay in place for 2 weeks or longer. If adhesive strip edges start to loosen and curl up, you may trim the loose edges. Do not remove adhesive strips completely unless your health care provider tells you to do that. 2. Keep your dressings clean and dry. 3. Check your incision areas every day for signs of infection. Check for: ? More redness, swelling, or pain. ? More fluid or blood. ? Warmth. ? Pus or a bad smell. Catheter care   Wash your hands with soap and water before and after caring for your catheter. If soap and water are not available, use hand sanitizer.  Keep your catheter site clean and dry.  Apply an antibiotic ointment to your catheter site as told by your health care provider.  Flush your catheter as told by your health care provider. This helps prevent it from becoming clogged.  Do not open the caps on the ends of  the catheter.  Do not pull on your catheter. Medicines  Take over-the-counter and prescription medicines only as told by your health care provider.  If you were prescribed an antibiotic medicine, take it as told by your health care provider. Do not stop taking the antibiotic even if you start to feel better. Activity  Return to your normal activities as told by your health care provider. Ask your health care provider what activities are safe for you.  Follow any other activity restrictions as instructed by your health care provider.  Do not lift anything that is heavier than 10 lb (4.5 kg), or the limit that you are told, until your health care provider says that it is safe. Driving  Do not drive until your health care provider approves.  Ask your health care provider if the medicine prescribed to you requires you to avoid driving or using heavy machinery. General instructions  Follow your health care provider's specific instructions for the type of catheter that you have.  Do not take baths, swim, or use a hot tub until your health care provider approves. Ask your health care provider if you may take showers.  Keep all follow-up visits as told by your health care provider. This is important. Contact a health care provider if:  You feel unusually weak or nauseous.  You have more redness, swelling, or pain at your incisions or around the area where your catheter is inserted.  Your catheter is not working properly.  You are unable to flush your catheter.  Get help right away if:  Your catheter develops a hole or it breaks.  You have pain or swelling when fluids or medicines are being given through the catheter.  Fluid is leaking from the catheter, under the dressing, or around the dressing.  Your catheter comes loose or gets pulled completely out. If this happens, press on your catheter site firmly with a clean cloth until you can get medical help.  You have swelling in  your shoulder, neck, chest, or face.  You have chest pain or difficulty breathing.  You feel dizzy or light-headed.  You have pus or a bad smell coming from your catheter site.  You have a fever or chills.  Your catheter site feels warm to the touch.  You develop bleeding from your catheter or your insertion site, and your bleeding does not stop. Summary  After the procedure, it is common to have mild redness, swelling, and pain around your catheter site.  Return to your normal activities as told by your health care provider. Ask your health care provider what activities are safe for you.  Follow your health care provider's specific instructions for the type of catheter that you have.  Keep your catheter site and your dressings clean and dry.  Contact a health care provider if your catheter is not working properly. Get help right away if you have chest pain, fever, or difficulty breathing. This information is not intended to replace advice given to you by your health care provider. Make sure you discuss any questions you have with your health care provider. Document Revised: 09/11/2018 Document Reviewed: 09/11/2018 Elsevier Patient Education  [place] Catheter Insertion, Care After This sheet gives you information about how to care for yourself after your procedure. Your health care provider may also give you more specific instructions. If you have problems or questions, contact your health care provider. What can I expect after the procedure? After the procedure, it is common to have:  Some mild redness, bruising, swelling, and pain around your catheter site.  A small amount of blood or clear fluid coming from your incisions. Follow these instructions at home: Incision care   Follow instructions from your health care provider about how to take care of your incisions. Make sure you: ? Wash your hands with soap and water before and after you change your  bandages (dressings). If soap and water are not available, use hand sanitizer. ? Change your dressings as told by your health care provider. Wash the area around your incisions with a germ-killing (antiseptic) solution when you change your dressings. ? Leave stitches (sutures), skin glue, or adhesive strips in place. These skin closures may need to stay in place for 2 weeks or longer. If adhesive strip edges start to loosen and curl up, you may trim the loose edges. Do not remove adhesive strips completely unless your health care provider tells you to do that.  Keep your dressings clean and dry.  Check your incision areas every day for signs of infection. Check for: ? More redness, swelling, or pain. ? More fluid or blood. ? Warmth. ? Pus or a bad smell. Catheter care   Wash your hands with soap and water before and after caring for your catheter. If soap and water are not available, use hand sanitizer.  Keep your catheter site clean and dry.  Apply an antibiotic ointment to your catheter site as told by your health care provider.  Flush your catheter as told by  your health care provider. This helps prevent it from becoming clogged.  Do not open the caps on the ends of the catheter.  Do not pull on your catheter. Medicines  Take over-the-counter and prescription medicines only as told by your health care provider.  If you were prescribed an antibiotic medicine, take it as told by your health care provider. Do not stop taking the antibiotic even if you start to feel better. Activity  Return to your normal activities as told by your health care provider. Ask your health care provider what activities are safe for you.  Follow any other activity restrictions as instructed by your health care provider.  Do not lift anything that is heavier than 10 lb (4.5 kg), or the limit that you are told, until your health care provider says that it is safe. Driving  Do not drive until your  health care provider approves.  Ask your health care provider if the medicine prescribed to you requires you to avoid driving or using heavy machinery. General instructions  Follow your health care provider's specific instructions for the type of catheter that you have.  Do not take baths, swim, or use a hot tub until your health care provider approves. Ask your health care provider if you may take showers.  Keep all follow-up visits as told by your health care provider. This is important. Contact a health care provider if:  You feel unusually weak or nauseous.  You have more redness, swelling, or pain at your incisions or around the area where your catheter is inserted.  Your catheter is not working properly.  You are unable to flush your catheter. Get help right away if:  Your catheter develops a hole or it breaks.  You have pain or swelling when fluids or medicines are being given through the catheter.  Fluid is leaking from the catheter, under the dressing, or around the dressing.  Your catheter comes loose or gets pulled completely out. If this happens, press on your catheter site firmly with a clean cloth until you can get medical help.  You have swelling in your shoulder, neck, chest, or face.  You have chest pain or difficulty breathing.  You feel dizzy or light-headed.  You have pus or a bad smell coming from your catheter site.  You have a fever or chills.  Your catheter site feels warm to the touch.  You develop bleeding from your catheter or your insertion site, and your bleeding does not stop. Summary  After the procedure, it is common to have mild redness, swelling, and pain around your catheter site.  Return to your normal activities as told by your health care provider. Ask your health care provider what activities are safe for you.  Follow your health care provider's specific instructions for the type of catheter that you have.  Keep your catheter  site and your dressings clean and dry.  Contact a health care provider if your catheter is not working properly. Get help right away if you have chest pain, fever, or difficulty breathing. This information is not intended to replace advice given to you by your health care provider. Make sure you discuss any questions you have with your health care provider. Document Revised: 09/11/2018 Document Reviewed: 09/11/2018 Elsevier Patient Education  [place].    Moderate Conscious Sedation, Adult, Care After These instructions provide you with information about caring for yourself after your procedure. Your health care provider may also give you more specific instructions.  Your treatment has been planned according to current medical practices, but problems sometimes occur. Call your health care provider if you have any problems or questions after your procedure. What can I expect after the procedure? After your procedure, it is common:  To feel sleepy for several hours.  To feel clumsy and have poor balance for several hours.  To have poor judgment for several hours.  To vomit if you eat too soon. Follow these instructions at home: For at least 24 hours after the procedure:   Do not: ? Participate in activities where you could fall or become injured. ? Drive. ? Use heavy machinery. ? Drink alcohol. ? Take sleeping pills or medicines that cause drowsiness. ? Make important decisions or sign legal documents. ? Take care of children on your own.  Rest. Eating and drinking  Follow the diet recommended by your health care provider.  If you vomit: ? Drink water, juice, or soup when you can drink without vomiting. ? Make sure you have little or no nausea before eating solid foods. General instructions  Have a responsible adult stay with you until you are awake and alert.  Take over-the-counter and prescription medicines only as told by your health care provider.  If you  smoke, do not smoke without supervision.  Keep all follow-up visits as told by your health care provider. This is important. Contact a health care provider if:  You keep feeling nauseous or you keep vomiting.  You feel light-headed.  You develop a rash.  You have a fever. Get help right away if:  You have trouble breathing. This information is not intended to replace advice given to you by your health care provider. Make sure you discuss any questions you have with your health care provider. Document Revised: 09/01/2017 Document Reviewed: 01/09/2016 Elsevier Patient Education  2020 [employer].

## 2020-07-07 NOTE — H&P (Signed)
[facility] Admission History & Physical  MRN : [id number]  Patricia Maldonado is a 39 y.o. (1981/01/07) female who presents with chief complaint of planned permcath exchange.  History of Present Illness:  I am asked to evaluate the patient by the dialysis center. The patient was sent here because they were unable to achieve adequate dialysis yesterday. Furthermore the Center states they were unable to aspirate either lumen of the catheter yesterday. The patient is unaware of any other change. Patient denies pain or tenderness overlying the access.  There is no pain with dialysis.  Patient denies fevers or shaking chills while on dialysis.    Scheduled permcath exchange.  Current Facility-Administered Medications  Medication Dose Route Frequency Provider Last Rate Last Admin  . 0.9 %  sodium chloride infusion   Intravenous Continuous Kris Hartmann, NP 10 mL/hr at 07/07/20 1320 New Bag at 07/07/20 1320  . clindamycin (CLEOCIN) 300 MG/50ML IVPB           . clindamycin (CLEOCIN) IVPB 300 mg  300 mg Intravenous Once Eulogio Ditch E, NP      . diphenhydrAMINE (BENADRYL) injection 50 mg  50 mg Intravenous Once PRN Kris Hartmann, NP      . famotidine (PEPCID) tablet 40 mg  40 mg Oral Once PRN Kris Hartmann, NP      . fentaNYL (SUBLIMAZE) 100 MCG/2ML injection           . HYDROmorphone (DILAUDID) injection 1 mg  1 mg Intravenous Once PRN Eulogio Ditch E, NP      . methylPREDNISolone sodium succinate (SOLU-MEDROL) 125 mg/2 mL injection 125 mg  125 mg Intravenous Once PRN Kris Hartmann, NP      . midazolam (VERSED) 2 MG/2ML injection           . midazolam (VERSED) 2 MG/ML syrup 8 mg  8 mg Oral Once PRN Kris Hartmann, NP      . ondansetron [facility]) injection 4 mg  4 mg Intravenous Q6H PRN Kris Hartmann, NP       Past Surgical History:  Procedure Laterality Date  . CYSTOSCOPY/URETEROSCOPY/HOLMIUM LASER/STENT PLACEMENT Right 02/21/2017   Procedure:  CYSTOSCOPY/URETEROSCOPY/HOLMIUM LASER/STENT EXCHANGE;  Surgeon: [name], MD;  Location: [facility] ORS;  Service: Urology;  Laterality: Right;  . DIALYSIS/PERMA CATHETER INSERTION N/A 11/30/2018   Procedure: DIALYSIS/PERMA CATHETER INSERTION;  Surgeon: Algernon Huxley, MD;  Location: [place] [facility];  Service: Cardiovascular;  Laterality: N/A;  . IR NEPHROSTOMY PLACEMENT RIGHT  01/23/2017  . NEPHROLITHOTOMY Right 01/24/2017   Procedure: NEPHROLITHOTOMY PERCUTANEOUS;  Surgeon: [name], MD;  Location: [facility] ORS;  Service: Urology;  Laterality: Right;  . PERITONEAL CATHETER INSERTION    . TUBAL LIGATION     Social History Social History   Tobacco Use  . Smoking status: Current Every Day Smoker    Packs/day: 0.25    Years: 15.00    Pack years: 3.75    Types: Cigarettes  . Smokeless tobacco: Never Used  Vaping Use  . Vaping Use: Never used  Substance Use Topics  . Alcohol use: No  . Drug use: Yes    Types: Marijuana    Comment: OCC   Family History Family History  Problem Relation Age of Onset  . Thyroid disease Mother   . Thyroid disease Father   . Diabetes Father   No family history of bleeding or clotting disorders, autoimmune disease or porphyria  Allergies  Allergen Reactions  .  Penicillins Swelling    Yeast infection Has patient had a PCN reaction causing immediate rash, facial/tongue/throat swelling, SOB or lightheadedness with hypotension:No Has patient had a PCN reaction causing severe rash involving mucus membranes or skin necrosis: Yes Has patient had a PCN reaction that required hospitalization No Has patient had a PCN reaction occurring within the last 10 years: No If all of the above answers are "NO", then may proceed with Cephalosporin use.  Yeast infection Has patient had a PCN reaction causing immediate rash, facial/tongue/throat swelling, SOB or lightheadedness with hypotension:No Has patient had a PCN reaction causing severe rash involving mucus  membranes or skin necrosis: Yes Has patient had a PCN reaction that required hospitalization No Has patient had a PCN reaction occurring within the last 10 years: No If all of the above answers are "NO", then may proceed with Cephalosporin use.    REVIEW OF SYSTEMS (Negative unless checked)  Constitutional: [] Weight loss  [] Fever  [] Chills Cardiac: [] Chest pain   [] Chest pressure   [] Palpitations   [] Shortness of breath when laying flat   [] Shortness of breath at rest   [x] Shortness of breath with exertion. Vascular:  [] Pain in legs with walking   [] Pain in legs at rest   [] Pain in legs when laying flat   [] Claudication   [] Pain in feet when walking  [] Pain in feet at rest  [] Pain in feet when laying flat   [] History of DVT   [] Phlebitis   [] Swelling in legs   [] Varicose veins   [] Non-healing ulcers Pulmonary:   [] Uses home oxygen   [] Productive cough   [] Hemoptysis   [] Wheeze  [] COPD   [] Asthma Neurologic:  [] Dizziness  [] Blackouts   [] Seizures   [] History of stroke   [] History of TIA  [] Aphasia   [] Temporary blindness   [] Dysphagia   [] Weakness or numbness in arms   [] Weakness or numbness in legs Musculoskeletal:  [] Arthritis   [] Joint swelling   [] Joint pain   [] Low back pain Hematologic:  [] Easy bruising  [] Easy bleeding   [] Hypercoagulable state   [] Anemic  [] Hepatitis Gastrointestinal:  [] Blood in stool   [] Vomiting blood  [] Gastroesophageal reflux/heartburn   [] Difficulty swallowing. Genitourinary:  [x] Chronic kidney disease   [] Difficult urination  [] Frequent urination  [] Burning with urination   [] Blood in urine Skin:  [] Rashes   [] Ulcers   [] Wounds Psychological:  [] History of anxiety   []  History of major depression.  Physical Examination  Vitals:   07/07/20 1305  BP: (!) 181/110  Pulse: 88  Resp: 18  Temp: 98.8 F (37.1 C)  TempSrc: Oral  SpO2: 100%  Weight: 44.9 kg  Height: 5\' 3"  (1.6 m)   Body mass index is 17.54 kg/m. Gen: WD/WN, NAD Head: [place]/AT, No temporalis  wasting. Prominent temp pulse not noted. Ear/Nose/Throat: Hearing grossly intact, nares w/o erythema or drainage, oropharynx w/o Erythema/Exudate,  Eyes: Conjunctiva clear, sclera non-icteric Neck: Trachea midline.  No JVD.  Pulmonary:  Good air movement, respirations not labored, no use of accessory muscles.  Cardiac: RRR, normal S1, S2. Vascular:  tunneled catheter without tenderness or drainage Vessel Right Left  Radial Palpable Palpable  Gastrointestinal: soft, non-tender/non-distended. No guarding/reflex.  Musculoskeletal: M/S 5/5 throughout.  Extremities without ischemic changes.  No deformity or atrophy.  Neurologic: Sensation grossly intact in extremities.  Symmetrical.  Speech is fluent. Motor exam as listed above. Psychiatric: Judgment intact, Mood & affect appropriate for pt's clinical situation. Dermatologic: No rashes or ulcers noted.  No cellulitis or open wounds.  Lymph : No Cervical, Axillary, or Inguinal lymphadenopathy.  CBC Lab Results  Component Value Date   WBC 6.0 07/04/2020   HGB 8.8 (L) 07/04/2020   HCT 28.1 (L) 07/04/2020   MCV 93.0 07/04/2020   PLT 120 (L) 07/04/2020   BMET    Component Value Date/Time   NA 136 07/04/2020 1311   K 3.7 07/04/2020 1311   CL 102 07/04/2020 1311   CO2 18 (L) 07/04/2020 1311   GLUCOSE 86 07/04/2020 1311   BUN 70 (H) 07/04/2020 1311   CREATININE 20.12 (H) 07/04/2020 1311   CALCIUM 9.6 07/04/2020 1311   GFRNONAA 2 (L) 07/04/2020 1311   GFRAA 2 (L) 07/04/2020 1311   Estimated Creatinine Clearance: 2.7 mL/min (A) (by C-G formula based on SCr of 20.12 mg/dL (H)).  COAG Lab Results  Component Value Date   INR 1.0 09/20/2019   INR 0.96 07/27/2017   INR 0.98 03/01/2017   Radiology CT Renal Stone Study  Result Date: 07/04/2020 CLINICAL DATA:  Dialysis catheter malfunction, diarrhea, lower abdominal pain EXAM: CT ABDOMEN AND PELVIS WITHOUT CONTRAST TECHNIQUE: Multidetector CT imaging of the abdomen and pelvis was performed  following the standard protocol without IV contrast. COMPARISON:  10/04/2016 FINDINGS: Lower chest: No acute abnormality. Hepatobiliary: No solid liver abnormality is seen. No gallstones, gallbladder wall thickening, or biliary dilatation. Pancreas: Unremarkable. No pancreatic ductal dilatation or surrounding inflammatory changes. Spleen: Normal in size without significant abnormality. Adrenals/Urinary Tract: Adrenal glands are unremarkable. Atrophic appearance of the kidneys. Small nonobstructive right renal calculi. No hydronephrosis. Bladder is unremarkable. Stomach/Bowel: Stomach is within normal limits. Appendix appears normal. No evidence of bowel wall thickening, distention, or inflammatory changes. Vascular/Lymphatic: Scattered aortic atherosclerosis. No enlarged abdominal or pelvic lymph nodes. Reproductive: No mass or other significant abnormality. Other: No abdominal wall hernia or abnormality. Trace ascites throughout the abdomen and pelvis. Musculoskeletal: Renal osteodystrophy. IMPRESSION: 1. No acute noncontrast CT findings of the abdomen or pelvis to explain abdominal pain or diarrhea. 2. Trace nonspecific ascites throughout the abdomen and pelvis. 3. Atrophic appearance of the kidneys with small nonobstructive right renal calculi. No hydronephrosis. 4. Renal osteodystrophy. 5. Aortic Atherosclerosis (ICD10-I70.0) advanced for patient age. Electronically Signed   By: Eddie Candle M.D.   On: 07/04/2020 19:57   Assessment/Plan 1.  Complication dialysis device with thrombosis AV access:  Patient's tunneled catheter is thrombosed. The patient will undergo exchange of the catheter same venous access using interventional techniques.  The risks and benefits were described to the patient.  All questions were answered.  The patient agrees to proceed with intervention.  2.  End-stage renal disease requiring hemodialysis:  Patient will continue dialysis therapy without further interruption if a successful  exchange is not achieved then new site will be found for tunneled catheter placement. Dialysis has already been arranged since the patient missed their previous session 3.  Hypertension:  Patient will continue medical management; nephrology is following no changes in oral medications.  Lyons Switch, PA-C  07/07/2020 2:57 PM

## 2020-07-07 NOTE — ED Provider Notes (Signed)
[facility] Emergency Department Provider Note  ____________________________________________  Time seen: Approximately 9:28 PM  I have reviewed the triage vital signs and the nursing notes.   HISTORY  Chief Complaint Post-op Problem    HPI [name] is a 39 y.o. female with a history of end-stage renal disease on hemodialysis, hypertension, lupus, seizures who comes the ED due to bleeding from her right upper chest.  Patient has had a nonfunctioning tunneled dialysis catheter for the past week or 2.  Today she had the old one removed and new one inserted in the OR.  On returning home, she was getting settled in bed when she noticed bleeding from her chest and came back to the ED.  Denies acute chest pain or shortness of breath, no dizziness or syncope.  No palpitations.  No dialysis yet.  She is normally on a Monday Wednesdays Friday schedule but believes they took her off the schedule until her catheter can be replaced.      Past Medical History:  Diagnosis Date  . Acute renal failure (ARF) ([place])   . Anemia   . Arthritis   . History of kidney stones   . Hypertension    OFF BP MEDS X 3 WEEKS DUE TO BP CONTROL  . 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 Rash   . Staghorn calculus   . 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[place]) 10/2016   [place] ADMISSION     Patient Active Problem List   Diagnosis Date Noted  . Blurred vision, bilateral 05/08/2020  . Seizures ([place]) 05/06/2020  . Cerebral edema ([place]) 12/23/2019  . Seizure disorder ([place]) 12/20/2019  . Acute repetitive seizure ([place]) 12/19/2019  . History of Closed sacral fracture ([place]) 12/19/2019  . Thrombocytopenia ([place]) 12/19/2019  . Anemia, unspecified 04/28/2019  . ESRD on hemodialysis ([place]) 02/21/2019  . Essential hypertension 02/21/2019  . CKD (chronic kidney disease), stage V ([place]) 09/12/2018  . Metabolic acidosis 77/41/2878  . Secondary hyperparathyroidism ([place])  09/12/2018  . Acute on chronic renal failure ([place]) 04/26/2018  . Acute kidney injury ([place]) 07/27/2017  . Alopecia of scalp 03/08/2017  . Thyroid nodule 03/08/2017  . PE (pulmonary thromboembolism) ([place]) 03/01/2017  . Kidney stone on left side 01/23/2017  . Systemic lupus erythematosus ([place]) 12/08/2016  . Encounter for long-term (current) use of high-risk medication 12/08/2016  . 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 Acute renal failure (ARF) ([place]) 10/04/2016  . Nephrolithiasis   . Staghorn calculus   . Acute renal failure with tubular necrosis ([place]) 08/30/2016     Past Surgical History:  Procedure Laterality Date  . CYSTOSCOPY/URETEROSCOPY/HOLMIUM LASER/STENT PLACEMENT Right 02/21/2017   Procedure: CYSTOSCOPY/URETEROSCOPY/HOLMIUM LASER/STENT EXCHANGE;  Surgeon: [name], MD;  Location: [facility] ORS;  Service: Urology;  Laterality: Right;  . DIALYSIS/PERMA CATHETER INSERTION N/A 11/30/2018   Procedure: DIALYSIS/PERMA CATHETER INSERTION;  Surgeon: Algernon Huxley, MD;  Location: [facility];  Service: Cardiovascular;  Laterality: N/A;  . IR NEPHROSTOMY PLACEMENT RIGHT  01/23/2017  . NEPHROLITHOTOMY Right 01/24/2017   Procedure: NEPHROLITHOTOMY PERCUTANEOUS;  Surgeon: [name], MD;  Location: [facility] ORS;  Service: Urology;  Laterality: Right;  . PERITONEAL CATHETER INSERTION    . TUBAL LIGATION       Prior to Admission medications   Medication Sig Start Date End Date Taking? Authorizing Provider  calcitRIOL (ROCALTROL) 0.5 MCG capsule 2 Capsule(Patricia) By Mouth Daily    [provider]  carvedilol (  COREG) 12.5 MG tablet tablet Take 3 tablets (37.5 mg total) by mouth two (2) times a day 04/25/20   [provider]  cephALEXin (KEFLEX) 250 MG capsule Take 1 capsule (250 mg total) by mouth 2 (two) times daily for 7 days. Patient not taking: Reported on 07/07/2020 07/04/20 07/11/20  Laban Emperor, PA-C  cinacalcet (SENSIPAR) 30 MG tablet Take  30 mg by mouth daily.    [provider]  citalopram (CELEXA) 10 MG tablet Take 10 mg by mouth daily.    [provider]  gentamicin cream (GARAMYCIN) 0.1 % APPLY TO EXIT SITE DAILY Patient not taking: Reported on 07/07/2020    [provider]  hydroxychloroquine (PLAQUENIL) 200 MG tablet  04/25/20   [provider]  hydroxyurea (DROXIA) 200 MG capsule Take 200 mg by mouth daily.    [provider]  levETIRAcetam (KEPPRA) 500 MG tablet TAKE 1 TABLET BY MOUTH TWICE DAILY WITH ADDITIONAL 1 TABLET AFTER DIALYSIS    [provider]  mycophenolate (CELLCEPT) 500 MG tablet Take 2 tablets (1,000 mg total) by mouth 2 (two) times daily. 04/30/18   Hillary Bow, MD  NIFEdipine (PROCARDIA XL/NIFEDICAL-XL) 90 MG 24 hr tablet Take 90 mg by mouth daily. 04/25/20   [provider]  ondansetron (ZOFRAN) 4 MG tablet Take 1 tablet (4 mg total) by mouth daily as needed for nausea or vomiting. 07/04/20 07/04/21  Laban Emperor, PA-C  prochlorperazine (COMPAZINE) 10 MG tablet Take 1 tablet (10 mg total) by mouth every 6 (six) hours as needed for up to 15 doses (30 Min prior to dialysis.). 05/10/20   Para Skeans, MD  sevelamer (RENAGEL) 800 MG tablet Take by mouth 3 (three) times daily with meals.    [provider]  valsartan (DIOVAN) 160 MG tablet Take 160 mg by mouth daily.    [provider]     Allergies Penicillins   Family History  Problem Relation Age of Onset  . Thyroid disease Mother   . Thyroid disease Father   . Diabetes Father     Social History Social History   Tobacco Use  . Smoking status: Current Every Day Smoker    Packs/day: 0.25    Years: 15.00    Pack years: 3.75    Types: Cigarettes  . Smokeless tobacco: Never Used  Vaping Use  . Vaping Use: Never used  Substance Use Topics  . Alcohol use: No  . Drug use: Yes    Types: Marijuana    Comment: OCC    Review of Systems  Constitutional:   No fever  or chills.  ENT:   No sore throat. No rhinorrhea. Cardiovascular:   No chest pain or syncope. Respiratory:   No dyspnea or cough. Gastrointestinal:   Negative for abdominal pain, vomiting and diarrhea.  Musculoskeletal:   Negative for focal pain or swelling All other systems reviewed and are negative except as documented above in ROS and HPI.  ____________________________________________   PHYSICAL EXAM:  VITAL SIGNS: ED Triage Vitals  Enc Vitals Group     BP 07/07/20 1905 (!) 167/116     Pulse Rate 07/07/20 1905 82     Resp 07/07/20 1905 20     Temp 07/07/20 1905 99 F (37.2 C)     Temp src --      SpO2 07/07/20 1905 100 %     Weight 07/07/20 1912 99 lb (44.9 kg)     Height 07/07/20 1912 5\' 3"  (1.6 m)  Head Circumference --      Peak Flow --      Pain Score 07/07/20 1912 10     Pain Loc --      Pain Edu? --      Excl. in [place]? --     Vital signs reviewed, nursing assessments reviewed.   Constitutional:   Alert and oriented. Non-toxic appearance. Eyes:   Conjunctivae are normal. EOMI. PERRL. ENT      Head:   Normocephalic and atraumatic.      Nose:   Wearing a mask.      Mouth/Throat:   Wearing a mask.      Neck:   No meningismus. Full ROM. Hematological/Lymphatic/Immunilogical:   No cervical lymphadenopathy. Cardiovascular:   RRR. Symmetric bilateral radial and DP pulses.  No murmurs. Cap refill less than 2 seconds.  Dialysis catheter in place on the right upper chest wall, hemostatic around the skin entry site.  There is a 1 cm wound in the right upper chest wall, likely the site of the old catheter that was removed.  No suture material present in the wound is stellate, suggesting that there was a suture which got snagged and avulsed through the skin.  There is soft tissue swelling overlying the medial clavicle over the course of the tunneled catheter.  Respiratory:   Normal respiratory effort without tachypnea/retractions. Breath sounds are clear and equal  bilaterally. No wheezes/rales/rhonchi. Gastrointestinal:   Soft and nontender. Non distended. There is no CVA tenderness.  No rebound, rigidity, or guarding.  Musculoskeletal:   Normal range of motion in all extremities. No joint effusions.  No lower extremity tenderness.  No edema. Neurologic:   Normal speech and language.  Motor grossly intact. No acute focal neurologic deficits are appreciated.  Skin:    Skin is warm, dry and intact. No rash noted.  No petechiae, purpura, or bullae.  ____________________________________________    LABS (pertinent positives/negatives) (all labs ordered are listed, but only abnormal results are displayed) Labs Reviewed  CBC - Abnormal; Notable for the following components:      Result Value   RBC 2.77 (*)    Hemoglobin 8.1 (*)    HCT 25.5 (*)    RDW 19.9 (*)    Platelets 133 (*)    All other components within normal limits  BASIC METABOLIC PANEL - Abnormal; Notable for the following components:   Potassium 3.4 (*)    CO2 17 (*)    BUN 82 (*)    Creatinine, Ser 23.26 (*)    GFR calc non Af Amer 2 (*)    Anion gap 16 (*)    All other components within normal limits   ____________________________________________   EKG    ____________________________________________    RADIOLOGY  CT Chest Wo Contrast  Result Date: 07/07/2020 CLINICAL DATA:  Bleeding from dialysis catheter site EXAM: CT CHEST WITHOUT CONTRAST TECHNIQUE: Multidetector CT imaging of the chest was performed following the standard protocol without IV contrast. COMPARISON:  None. FINDINGS: Cardiovascular: Limited due to lack of IV contrast. No atherosclerotic calcifications of the aorta are noted. No cardiac enlargement is seen. Right-sided jugular dialysis catheter is noted in satisfactory position. Mild inflammatory changes are noted in the right chest wall related to the recent exchange as well as a small amount of hemorrhage beneath the skin surface consistent with the given  clinical history. No sizable hematoma is noted. Mediastinum/Nodes: Thoracic inlet again demonstrates a 1.7 cm hypodense nodule within the inferior pole of the  right lobe of the thyroid. This is stable from a prior ultrasound from 2018 and again does not meet criteria for biopsy. No sizable adenopathy is noted. The esophagus is within normal limits. Lungs/Pleura: Lungs are well aerated bilaterally. No focal infiltrate or sizable effusion is seen. Upper Abdomen: Visualized upper abdomen shows atrophy of the native kidneys. Scattered nonobstructing right renal calculi are noted. No other focal abnormality in the upper abdomen is seen. Musculoskeletal: No chest wall mass or suspicious bone lesions identified. IMPRESSION: Changes consistent with the known history of recent dialysis catheter exchange. No sizable hematoma is identified. No findings to suggest active extravasation are seen. Hypodense nodule within the lower pole of the right lobe of the thyroid. This is stable from 2018 Recommend nonemergent thyroid US for comparison to prior exam from 2018.(ref: J Am Coll Radiol. 2015 Feb;12(2): 143-50). Nonobstructing right renal calculi. Electronically Signed   By: Inez Catalina M.D.   On: 07/07/2020 22:12   PERIPHERAL VASCULAR CATHETERIZATION  Result Date: 07/07/2020 See Op Note   ____________________________________________   PROCEDURES .Marland KitchenLaceration Repair  Date/Time: 07/07/2020 11:50 PM Performed by: Carrie Mew, MD Authorized by: Carrie Mew, MD   Consent:    Consent obtained:  Verbal   Consent given by:  Patient   Risks discussed:  Infection, pain, retained foreign body, poor cosmetic result and poor wound healing   Alternatives discussed:  No treatment Anesthesia (see MAR for exact dosages):    Anesthesia method:  Local infiltration   Local anesthetic:  Lidocaine 1% w/o epi Laceration details:    Location:  Trunk   Trunk location:  R chest   Length (cm):  1 Repair type:     Repair type:  Simple Pre-procedure details:    Preparation:  Imaging obtained to evaluate for foreign bodies and patient was prepped and draped in usual sterile fashion Exploration:    Hemostasis achieved with:  Direct pressure   Wound exploration: entire depth of wound probed and visualized     Wound extent: no foreign bodies/material noted, no muscle damage noted and no vascular damage noted     Contaminated: no   Treatment:    Area cleansed with:  Saline and Betadine   Amount of cleaning:  Extensive   Irrigation solution:  Sterile saline   Visualized foreign bodies/material removed: no   Skin repair:    Repair method:  Sutures   Suture size:  4-0   Wound skin closure material used: monocryl.   Suture technique:  Horizontal mattress   Number of sutures:  1 Approximation:    Approximation:  Close Post-procedure details:    Dressing:  Sterile dressing   Patient tolerance of procedure:  Tolerated well, no immediate complications    ____________________________________________  DIFFERENTIAL DIAGNOSIS   Soft tissue wound, subcutaneous hematoma, catheter disruption, hyperkalemia, azotemia  CLINICAL IMPRESSION / ASSESSMENT AND PLAN / ED COURSE  Medications ordered in the ED: Medications  oxyCODONE-acetaminophen (PERCOCET/ROXICET) 5-325 MG per tablet 1 tablet (1 tablet Oral Given 07/07/20 1917)  lidocaine (PF) (XYLOCAINE) 1 % injection 5 mL (5 mLs Intradermal Given by Other 07/07/20 2254)    Pertinent labs & imaging results that were available during my care of the patient were reviewed by me and considered in my medical decision making (see chart for details).  Joda Patricia Empey was evaluated in Emergency Department on 07/07/2020 for the symptoms described in the history of present illness. She was evaluated in the context of the global COVID-19 pandemic, which necessitated consideration that  the patient might be at risk for infection with the SARS-CoV-2 virus that causes COVID-19.  Institutional protocols and algorithms that pertain to the evaluation of patients at risk for COVID-19 are in a state of rapid change based on information released by regulatory bodies including the CDC and federal and state organizations. These policies and algorithms were followed during the patient'Patricia care in the ED.   Patient presents with bleeding from right upper chest after having dialysis catheter replacement today.  On exam, I suspect the bleeding was coming from the skin site where the old tunneled catheter was removed.  There was probably a suture here which got torn through the skin causing bleeding.  Exam also find some swelling overlying the medial clavicle.  This could be normal postoperative changes versus hematoma or some other complications will obtain a CT scan of the chest to further evaluate.  Vital signs are unremarkable.  Labs show potassium of 3.4, BUN of 80, CBC is stable.  No urgent indication for dialysis, stable for outpatient management if CT is reassuring.  We will plan to place a mattress suture through the remaining wound from the prior catheter if there are no significant findings on CT.   ----------------------------------------- 11:49 PM on 07/07/2020 -----------------------------------------  CT unremarkable, no sizeable hematoma or other complication.  Chest wound cleaned, closed with mattress suture. Hemostatic. Catheters wrapped with clean gauze. Stable for DC home - she'll call her HD center in the morning.     ____________________________________________   FINAL CLINICAL IMPRESSION(Patricia) / ED DIAGNOSES    Final diagnoses:  Postoperative pain  Bleeding from wound  ESRD on hemodialysis [facility])     ED Discharge Orders    None      Portions of this note were generated with dragon dictation software. Dictation errors may occur despite best attempts at proofreading.   Carrie Mew, MD 07/07/20 2351

## 2020-07-07 NOTE — ED Triage Notes (Signed)
Pt comes into the ED via EMS from home with c/o minimal bleeding around her chest dialysis catheter today/ VSS,

## 2020-07-07 NOTE — Op Note (Signed)
[facility]   OPERATIVE NOTE     PROCEDURE: 1. Exchange right IJ tunneled dialysis catheter over wire same access   PRE-OPERATIVE DIAGNOSIS: Complication of dialysis device with nonfunction of tunneled catheter; end-stage renal requiring hemodialysis  POST-OPERATIVE DIAGNOSIS: same as above  SURGEON: Katha Cabal, M.D.  ANESTHESIA: Conscious sedation was administered under my direct supervision by the interventional radiology RN.  IV Versed plus fentanyl were utilized. Continuous ECG, pulse oximetry and blood pressure was monitored throughout the entire procedure.  Conscious sedation was for a total of 36 minutes.  ESTIMATED BLOOD LOSS: Minimal  FINDING(S): 1.  Tips of the catheter in the right atrium on fluoroscopy 2.  No obvious pneumothorax on fluoroscopy  SPECIMEN(S):  none  INDICATIONS:   Patricia Maldonado is a 39 y.o. female  presents with end stage renal disease.  Therefore, the patient requires a tunneled dialysis catheter placement.  The patient is informed of  the risks catheter placement include but are not limited to: bleeding, infection, central venous injury, pneumothorax, possible venous stenosis, possible malpositioning in the venous system, and possible infections related to long-term catheter presence.  The patient was aware of these risks and agreed to proceed.  DESCRIPTION: The patient was taken back to Special Procedure suite.  Prior to sedation, the patient was given IV antibiotics.  After obtaining adequate sedation, the patient was prepped and draped in the standard fashion for a chest or neck tunneled dialysis catheter placement.  Appropriate Time Out is called.   The right neck and chest wall are then infiltrated with 1% Lidocaine with epinepherine.  A  Right IJ catheter is then selected, opened on the back table and prepped.  The cuff of the existing catheter is localized.  Using both blunt and sharp dissection the cuff is then  freed from surrounding attachments. The catheter is then controlled with a hemostat and transected above the level of the cuff.  Under fluoroscopy an Amplatz Super Stiff wire is introduced through the catheter and negotiated into the inferior vena cava. The remaining portion of the catheter is then removed without difficulty.  A new catheter is then threaded over the wire and advanced under fluoroscopy and positioned so that the catheter tip is in the proximal atrium.  Each port was tested by aspirating and flushing.  No resistance was noted.  Each port was then thoroughly flushed with heparinized saline.  The catheter was secured in placed with two interrupted stitches of 0 silk tied to the catheter.   Each port was then packed with concentrated heparin (10,000 Units/mL) at the manufacturer recommended volumes to each port.  Sterile caps were applied to each port.  On completion fluoroscopy, the tips of the catheter were in the right atrium, and there was no evidence of pneumothorax.  COMPLICATIONS: None  CONDITION: [name] 07/07/2020,4:10 PM [facility] Office: [phone]   07/07/2020, 4:10 PM

## 2020-07-07 NOTE — ED Triage Notes (Signed)
PT to ED via EMS from home c/o pain and bleeding from dialysis catheter site. PT had catheter replaced this afternoon and noticed it bleeding shortly after coming home. PT continues to bleed, tearful in traige. Color WDL.

## 2020-07-07 NOTE — Interval H&P Note (Signed)
History and Physical Interval Note:  07/07/2020 3:06 PM  Patricia Maldonado  has presented today for surgery, with the diagnosis of Perma Cath Exchange  End Stage Renal Covid Sept 30.  The various methods of treatment have been discussed with the patient and family. After consideration of risks, benefits and other options for treatment, the patient has consented to  Procedure(s): DIALYSIS/PERMA CATHETER INSERTION (N/A) as a surgical intervention.  The patient's history has been reviewed, patient examined, no change in status, stable for surgery.  I have reviewed the patient's chart and labs.  Questions were answered to the patient's satisfaction.     Hortencia Pilar

## 2020-07-07 NOTE — ED Notes (Signed)
Report received from Robin RN. Patient care assumed. Patient/RN introduction complete. Will continue to monitor.  

## 2020-07-08 ENCOUNTER — Encounter: Payer: Self-pay | Admitting: Vascular Surgery

## 2020-07-29 ENCOUNTER — Other Ambulatory Visit (INDEPENDENT_AMBULATORY_CARE_PROVIDER_SITE_OTHER): Payer: Self-pay | Admitting: Vascular Surgery

## 2020-07-29 DIAGNOSIS — N186 End stage renal disease: Secondary | ICD-10-CM

## 2020-07-30 ENCOUNTER — Other Ambulatory Visit (INDEPENDENT_AMBULATORY_CARE_PROVIDER_SITE_OTHER): Payer: Medicaid Other

## 2020-07-30 ENCOUNTER — Encounter (INDEPENDENT_AMBULATORY_CARE_PROVIDER_SITE_OTHER): Payer: Medicaid Other

## 2020-07-30 ENCOUNTER — Ambulatory Visit (INDEPENDENT_AMBULATORY_CARE_PROVIDER_SITE_OTHER): Payer: Medicaid Other | Admitting: Nurse Practitioner

## 2020-08-18 ENCOUNTER — Ambulatory Visit (INDEPENDENT_AMBULATORY_CARE_PROVIDER_SITE_OTHER): Payer: Medicaid Other | Admitting: Nurse Practitioner

## 2020-08-18 ENCOUNTER — Encounter (INDEPENDENT_AMBULATORY_CARE_PROVIDER_SITE_OTHER): Payer: Medicaid Other

## 2020-08-18 ENCOUNTER — Other Ambulatory Visit (INDEPENDENT_AMBULATORY_CARE_PROVIDER_SITE_OTHER): Payer: Medicaid Other

## 2020-08-19 ENCOUNTER — Emergency Department
Admission: EM | Admit: 2020-08-19 | Discharge: 2020-08-19 | Disposition: A | Discharge status: Home / Self Care | Payer: Medicaid Other | Attending: Emergency Medicine | Admitting: Emergency Medicine

## 2020-08-19 ENCOUNTER — Other Ambulatory Visit: Payer: Self-pay

## 2020-08-19 ENCOUNTER — Emergency Department: Payer: Medicaid Other

## 2020-08-19 DIAGNOSIS — Z79899 Other long term (current) drug therapy: Secondary | ICD-10-CM | POA: Insufficient documentation

## 2020-08-19 DIAGNOSIS — R519 Headache, unspecified: Secondary | ICD-10-CM

## 2020-08-19 DIAGNOSIS — F17210 Nicotine dependence, cigarettes, uncomplicated: Secondary | ICD-10-CM | POA: Diagnosis not present

## 2020-08-19 DIAGNOSIS — H53149 Visual discomfort, unspecified: Secondary | ICD-10-CM | POA: Insufficient documentation

## 2020-08-19 DIAGNOSIS — R1110 Vomiting, unspecified: Secondary | ICD-10-CM | POA: Insufficient documentation

## 2020-08-19 DIAGNOSIS — Z992 Dependence on renal dialysis: Secondary | ICD-10-CM | POA: Insufficient documentation

## 2020-08-19 DIAGNOSIS — I120 Hypertensive chronic kidney disease with stage 5 chronic kidney disease or end stage renal disease: Secondary | ICD-10-CM | POA: Diagnosis not present

## 2020-08-19 DIAGNOSIS — R197 Diarrhea, unspecified: Secondary | ICD-10-CM | POA: Insufficient documentation

## 2020-08-19 DIAGNOSIS — Z20822 Contact with and (suspected) exposure to covid-19: Secondary | ICD-10-CM | POA: Diagnosis not present

## 2020-08-19 DIAGNOSIS — E876 Hypokalemia: Secondary | ICD-10-CM | POA: Diagnosis not present

## 2020-08-19 DIAGNOSIS — N186 End stage renal disease: Secondary | ICD-10-CM | POA: Insufficient documentation

## 2020-08-19 DIAGNOSIS — I10 Essential (primary) hypertension: Secondary | ICD-10-CM

## 2020-08-19 LAB — BASIC METABOLIC PANEL
Anion gap: 14 (ref 5–15)
BUN: 29 mg/dL — ABNORMAL HIGH (ref 6–20)
CO2: 23 mmol/L (ref 22–32)
Calcium: 8.7 mg/dL — ABNORMAL LOW (ref 8.9–10.3)
Chloride: 97 mmol/L — ABNORMAL LOW (ref 98–111)
Creatinine, Ser: 8.34 mg/dL — ABNORMAL HIGH (ref 0.44–1.00)
GFR, Estimated: 6 mL/min — ABNORMAL LOW (ref >60)
Glucose, Bld: 90 mg/dL (ref 70–99)
Potassium: 2.4 mmol/L — CL (ref 3.5–5.1)
Sodium: 134 mmol/L — ABNORMAL LOW (ref 135–145)

## 2020-08-19 LAB — CBC
HCT: 29.3 % — ABNORMAL LOW (ref 36.0–46.0)
Hemoglobin: 9.7 g/dL — ABNORMAL LOW (ref 12.0–15.0)
MCH: 30.8 pg (ref 26.0–34.0)
MCHC: 33.1 g/dL (ref 30.0–36.0)
MCV: 93 fL (ref 80.0–100.0)
Platelets: 113 10*3/uL — ABNORMAL LOW (ref 150–400)
RBC: 3.15 MIL/uL — ABNORMAL LOW (ref 3.87–5.11)
RDW: 17.9 % — ABNORMAL HIGH (ref 11.5–15.5)
WBC: 3.7 10*3/uL — ABNORMAL LOW (ref 4.0–10.5)
nRBC: 0 % (ref 0.0–0.2)

## 2020-08-19 LAB — RESPIRATORY PANEL BY RT PCR (FLU A&B, COVID)
Influenza A by PCR: NEGATIVE
Influenza B by PCR: NEGATIVE
SARS Coronavirus 2 by RT PCR: NEGATIVE

## 2020-08-19 LAB — TROPONIN I (HIGH SENSITIVITY): Troponin I (High Sensitivity): 14 ng/L (ref <18)

## 2020-08-19 MED ORDER — DIPHENHYDRAMINE HCL 50 MG/ML IJ SOLN
25.0000 mg | Freq: Once | INTRAMUSCULAR | Status: AC
  Administered 2020-08-19: 25 mg via INTRAVENOUS
  Filled 2020-08-19: qty 1

## 2020-08-19 MED ORDER — IRBESARTAN 150 MG PO TABS
150.0000 mg | ORAL_TABLET | Freq: Every day | ORAL | Status: DC
  Administered 2020-08-19: 150 mg via ORAL
  Filled 2020-08-19: qty 1

## 2020-08-19 MED ORDER — PROCHLORPERAZINE EDISYLATE 10 MG/2ML IJ SOLN
10.0000 mg | Freq: Once | INTRAMUSCULAR | Status: AC
  Administered 2020-08-19: 10 mg via INTRAVENOUS
  Filled 2020-08-19: qty 2

## 2020-08-19 MED ORDER — ACETAMINOPHEN 500 MG PO TABS
1000.0000 mg | ORAL_TABLET | Freq: Once | ORAL | Status: AC
  Administered 2020-08-19: 1000 mg via ORAL
  Filled 2020-08-19: qty 2

## 2020-08-19 MED ORDER — CARVEDILOL 6.25 MG PO TABS
12.5000 mg | ORAL_TABLET | Freq: Once | ORAL | Status: AC
  Administered 2020-08-19: 12.5 mg via ORAL
  Filled 2020-08-19: qty 2

## 2020-08-19 MED ORDER — NIFEDIPINE ER OSMOTIC RELEASE 90 MG PO TB24
90.0000 mg | ORAL_TABLET | Freq: Every day | ORAL | Status: DC
  Filled 2020-08-19 (×2): qty 1

## 2020-08-19 MED ORDER — POTASSIUM CHLORIDE CRYS ER 20 MEQ PO TBCR
20.0000 meq | EXTENDED_RELEASE_TABLET | Freq: Once | ORAL | Status: AC
  Administered 2020-08-19: 20 meq via ORAL
  Filled 2020-08-19: qty 1

## 2020-08-19 MED ORDER — NIFEDIPINE ER OSMOTIC RELEASE 30 MG PO TB24
90.0000 mg | ORAL_TABLET | Freq: Every day | ORAL | Status: DC
  Administered 2020-08-19: 90 mg via ORAL
  Filled 2020-08-19: qty 3

## 2020-08-19 NOTE — ED Triage Notes (Signed)
Pt here via es from [facility]. facility called ems due to pt systolic being above 473. Completed 1/2 of dialysis treatment. Hypertension. Headache since yesterday. Takes bp meds at home but missed dose yesterday. Missed treatment on Monday due to transportation.   Ems vitals 194/103 72 100%

## 2020-08-19 NOTE — ED Triage Notes (Signed)
Pt comes into the ED via [facility] from dialysis where she was HTN with systolic over 779.  Pt only received 1/2 dialysis treatment today and c/o headache.   Pt neurologically intact.  PT states she did miss her HTN medication yesterday and missed a dialysis treatment on Monday d/t transportation.

## 2020-08-19 NOTE — ED Notes (Signed)
Pt with n/v/d for several days. Pt reports she missed her Friday dialysis and morning BP meds due to headache/n/v/d. Pt reports she had full dialysis Monday, missed BP meds yesterday AM.   Pt alert and oriented x4. Pt with one episode of diarrhea while here

## 2020-08-19 NOTE — ED Notes (Signed)
Signature pad unresponsive. Pt verbally acknowledges d/c instructions, no further questions

## 2020-08-19 NOTE — ED Provider Notes (Signed)
[facility] Emergency Department Provider Note  ____________________________________________   First MD Initiated Contact with Patient 08/19/20 1524     (approximate)  I have reviewed the triage vital signs and the nursing notes.   HISTORY  Chief Complaint Hypertension   HPI Patricia Maldonado is a 39 y.o. female with a past medical history of lupus nephritis, ESRD on HD and HTN who presents EMS from her dialysis center with concerns of elevated blood pressure in the setting of headache that began yesterday associate with some nonbloody nonbilious emesis today and some diarrhea.  Patient states her headache was particularly bad during dialysis but has eased off a little bit.  She states she did not take her blood pressure medicines today as a dialysis technician told not to take it on days she was scheduled to do dialysis.  She denies any prior similar headaches.  Endorses some photophobia.  She endorses several weeks of nonbloody diarrhea but denies any other acute sick symptoms including vision changes, vertigo, chest pain, cough, shortness of breath, dental pain, back pain, burning with urination, blood in her stool, blood in her urine, rash, extremity pain, focal weakness numbness or tingling, recent falls or injuries, or other acute complaints.         Past Medical History:  Diagnosis Date  . Acute renal failure (ARF) ([place])   . Anemia   . Arthritis   . History of kidney stones   . Hypertension    OFF BP MEDS X 3 WEEKS DUE TO BP CONTROL  . Lupus nephritis ([place]) 12/2016   DX DURING HOSPITAL ADMISSION  . Rash   . Staghorn calculus   . 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[place]) 10/2016   [employer] ADMISSION    Patient Active Problem List   Diagnosis Date Noted  . Blurred vision, bilateral 05/08/2020  . Seizures ([place]) 05/06/2020  . Cerebral edema ([place]) 12/23/2019  . Seizure disorder ([place]) 12/20/2019  . Acute repetitive seizure ([place]) 12/19/2019  . History of  Closed sacral fracture ([place]) 12/19/2019  . Thrombocytopenia ([place]) 12/19/2019  . Anemia, unspecified 04/28/2019  . ESRD on hemodialysis ([place]) 02/21/2019  . Essential hypertension 02/21/2019  . CKD (chronic kidney disease), stage V ([place]) 09/12/2018  . Metabolic acidosis 42/87/6811  . Secondary hyperparathyroidism ([place]) 09/12/2018  . Acute on chronic renal failure ([place]) 04/26/2018  . Acute kidney injury ([place]) 07/27/2017  . Alopecia of scalp 03/08/2017  . Thyroid nodule 03/08/2017  . PE (pulmonary thromboembolism) ([place]) 03/01/2017  . Kidney stone on left side 01/23/2017  . Systemic lupus erythematosus ([place]) 12/08/2016  . Encounter for long-term (current) use of high-risk medication 12/08/2016  . 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 Lupus nephritis ([place]) 11/07/2016  . Acute renal failure (ARF) ([place]) 10/04/2016  . Nephrolithiasis   . Staghorn calculus   . Acute renal failure with tubular necrosis ([place]) 08/30/2016    Past Surgical History:  Procedure Laterality Date  . CYSTOSCOPY/URETEROSCOPY/HOLMIUM LASER/STENT PLACEMENT Right 02/21/2017   Procedure: CYSTOSCOPY/URETEROSCOPY/HOLMIUM LASER/STENT EXCHANGE;  Surgeon: [name], MD;  Location: [facility] ORS;  Service: Urology;  Laterality: Right;  . DIALYSIS/PERMA CATHETER INSERTION N/A 11/30/2018   Procedure: DIALYSIS/PERMA CATHETER INSERTION;  Surgeon: Algernon Huxley, MD;  Location: [place] [facility];  Service: Cardiovascular;  Laterality: N/A;  . DIALYSIS/PERMA CATHETER INSERTION N/A 07/07/2020   Procedure: DIALYSIS/PERMA CATHETER INSERTION;  Surgeon: Katha Cabal, MD;  Location: [place] [facility];  Service: Cardiovascular;  Laterality: N/A;  . IR NEPHROSTOMY PLACEMENT RIGHT  01/23/2017  .  NEPHROLITHOTOMY Right 01/24/2017   Procedure: NEPHROLITHOTOMY PERCUTANEOUS;  Surgeon: [name], MD;  Location: [facility] ORS;  Service: Urology;  Laterality: Right;  . PERITONEAL CATHETER INSERTION    . TUBAL LIGATION      Prior to Admission  medications   Medication Sig Start Date End Date Taking? Authorizing Provider  calcitRIOL (ROCALTROL) 0.5 MCG capsule 2 Capsule(s) By Mouth Daily    [provider]  carvedilol (COREG) 12.5 MG tablet tablet Take 3 tablets (37.5 mg total) by mouth two (2) times a day 04/25/20   [provider]  cinacalcet (SENSIPAR) 30 MG tablet Take 30 mg by mouth daily.    [provider]  citalopram (CELEXA) 10 MG tablet Take 10 mg by mouth daily.    [provider]  gentamicin cream (GARAMYCIN) 0.1 % APPLY TO EXIT SITE DAILY Patient not taking: Reported on 07/07/2020    [provider]  hydroxychloroquine (PLAQUENIL) 200 MG tablet  04/25/20   [provider]  hydroxyurea (DROXIA) 200 MG capsule Take 200 mg by mouth daily.    [provider]  levETIRAcetam (KEPPRA) 500 MG tablet TAKE 1 TABLET BY MOUTH TWICE DAILY WITH ADDITIONAL 1 TABLET AFTER DIALYSIS    [provider]  mycophenolate (CELLCEPT) 500 MG tablet Take 2 tablets (1,000 mg total) by mouth 2 (two) times daily. 04/30/18   Hillary Bow, MD  NIFEdipine (PROCARDIA XL/NIFEDICAL-XL) 90 MG 24 hr tablet Take 90 mg by mouth daily. 04/25/20   [provider]  ondansetron (ZOFRAN) 4 MG tablet Take 1 tablet (4 mg total) by mouth daily as needed for nausea or vomiting. 07/04/20 07/04/21  Laban Emperor, PA-C  prochlorperazine (COMPAZINE) 10 MG tablet Take 1 tablet (10 mg total) by mouth every 6 (six) hours as needed for up to 15 doses (30 Min prior to dialysis.). 05/10/20   Para Skeans, MD  sevelamer (RENAGEL) 800 MG tablet Take by mouth 3 (three) times daily with meals.    [provider]  valsartan (DIOVAN) 160 MG tablet Take 160 mg by mouth daily.    [provider]    Allergies Penicillins  Family History  Problem Relation Age of Onset  . Thyroid disease Mother   . Thyroid disease Father   . Diabetes Father     Social History Social History   Tobacco Use    . Smoking status: Current Every Day Smoker    Packs/day: 0.25    Years: 15.00    Pack years: 3.75    Types: Cigarettes  . Smokeless tobacco: Never Used  Vaping Use  . Vaping Use: Never used  Substance Use Topics  . Alcohol use: No  . Drug use: Yes    Types: Marijuana    Comment: OCC    Review of Systems  Review of Systems  Constitutional: Negative for chills and fever.  HENT: Negative for sore throat.   Eyes: Negative for pain.  Respiratory: Negative for cough and stridor.   Cardiovascular: Negative for chest pain.  Gastrointestinal: Positive for diarrhea, nausea and vomiting.  Skin: Negative for rash.  Neurological: Positive for headaches. Negative for seizures and loss of consciousness.  Psychiatric/Behavioral: Negative for suicidal ideas.  All other systems reviewed and are negative.     ____________________________________________   PHYSICAL EXAM:  VITAL SIGNS: ED Triage Vitals  Enc Vitals Group     BP 08/19/20 1418 (!) 166/100     Pulse Rate 08/19/20 1418 62     Resp 08/19/20 1418 18  Temp 08/19/20 1418 98.2 F (36.8 C)     Temp Source 08/19/20 1418 Oral     SpO2 08/19/20 1418 100 %     Weight 08/19/20 1419 108 lb (49 kg)     Height 08/19/20 1419 5\' 2"  (1.575 m)     Head Circumference --      Peak Flow --      Pain Score 08/19/20 1418 9     Pain Loc --      Pain Edu? --      Excl. in [place]? --    Vitals:   08/19/20 1635 08/19/20 1931  BP:  (!) 160/113  Pulse: 72 73  Resp:  16  Temp:  98.4 F (36.9 C)  SpO2:  100%   Physical Exam Vitals and nursing note reviewed.  Constitutional:      General: She is not in acute distress.    Appearance: She is well-developed.  HENT:     Head: Normocephalic and atraumatic.  Eyes:     Conjunctiva/sclera: Conjunctivae normal.  Cardiovascular:     Rate and Rhythm: Normal rate and regular rhythm.     Heart sounds: No murmur heard.   Pulmonary:     Effort: Pulmonary effort is normal. No respiratory  distress.     Breath sounds: Normal breath sounds.  Abdominal:     Palpations: Abdomen is soft.     Tenderness: There is no abdominal tenderness.  Musculoskeletal:     Cervical back: Neck supple.  Skin:    General: Skin is warm and dry.  Neurological:     Mental Status: She is alert.     Cranial nerves II through XII grossly intact.  No pronator drift.  No finger dysmetria.  Symmetric 5/5 strength of all extremities.  Sensation intact to light touch in all extremities.  Unremarkable unassisted gait.  ____________________________________________   LABS (all labs ordered are listed, but only abnormal results are displayed)  Labs Reviewed  BASIC METABOLIC PANEL - Abnormal; Notable for the following components:      Result Value   Sodium 134 (*)    Potassium 2.4 (*)    Chloride 97 (*)    BUN 29 (*)    Creatinine, Ser 8.34 (*)    Calcium 8.7 (*)    GFR, Estimated 6 (*)    All other components within normal limits  CBC - Abnormal; Notable for the following components:   WBC 3.7 (*)    RBC 3.15 (*)    Hemoglobin 9.7 (*)    HCT 29.3 (*)    RDW 17.9 (*)    Platelets 113 (*)    All other components within normal limits  RESPIRATORY PANEL BY RT PCR (FLU A&B, COVID)  TROPONIN I (HIGH SENSITIVITY)   ____________________________________________  EKG  Sinus rhythm with a ventricular rate of a 66, normal axis, unremarkable intervals, nonspecific ST change in anterior lateral leads, no other clear evidence of acute ischemia or other significant underlying arrhythmia. ____________________________________________  RADIOLOGY  ED MD interpretation: No evidence of acute intracranial hemorrhage or other acute intracranial process.  Official radiology report(s): CT Head Wo Contrast  Result Date: 08/19/2020 CLINICAL DATA:  39 year old female with concern for intracranial hemorrhage. EXAM: CT HEAD WITHOUT CONTRAST TECHNIQUE: Contiguous axial images were obtained from the base of the  skull through the vertex without intravenous contrast. COMPARISON:  Head CT dated 05/06/2020. FINDINGS: Brain: No evidence of acute infarction, hemorrhage, hydrocephalus, extra-axial collection or mass lesion/mass effect. Vascular: No hyperdense  vessel or unexpected calcification. Skull: Normal. Negative for fracture or focal lesion. Sinuses/Orbits: No acute finding. Other: None IMPRESSION: Normal noncontrast CT of the brain. Electronically Signed   By: Anner Crete M.D.   On: 08/19/2020 17:18    ____________________________________________   PROCEDURES  Procedure(s) performed (including Critical Care):  .1-3 Lead EKG Interpretation Performed by: Lucrezia Starch, MD Authorized by: Lucrezia Starch, MD     Interpretation: normal     ECG rate assessment: normal     Rhythm: sinus rhythm     Ectopy: none     Conduction: normal       ____________________________________________   INITIAL IMPRESSION / ASSESSMENT AND PLAN / ED COURSE        Patient presents with [place] to history exam for assessment of headache and elevated blood pressure at dialysis earlier today after she states she did not take her dialysis medicines today.  On arrival patient is hypertensive with a BP of 166/100 otherwise stable vital signs on room air.  Suspect patient may have an element of hypertensive urgency given she reports a headache and some vomiting associate elevated blood pressures.  No focal deficits on exam to suggest a stroke and no evidence of hemorrhage on CT head.  No foci of infectious process on exam although patient does endorse some diarrhea it is possible she has a viral gastritis.  ECG has nonspecific ST change but troponin is not elevated and I suspect this nonspecific change may be related to elevated blood pressures no very low suspicion for ACS at this time.  BMP remarkable for hyperkalemia with a K of 2.4 which was repleted as noted below.  Otherwise BUN and creatinine are elevated but  consistent with patient's known history of CKD.  Patient has no evidence of significant hyponatremia or acidosis.  CBC remarkable for evidence of leukopenia and anemia with hemoglobin of 9.7 but patient's baseline noted 1 month ago was 8.1.  Patient is also noted to be chronically from cytopenic and today her platelets are 113 compared to 133 1 month ago.  Patient's headache treated with below noted headache cocktail and home BP medicines were reordered.  On reassessment patient stated she felt much improved.  Advised patient to take all of her blood pressure medicines as directed and continue regular dialysis schedule.  Patient voiced understanding agreement this plan.  Discharged stable condition.   ____________________________________________   FINAL CLINICAL IMPRESSION(S) / ED DIAGNOSES  Final diagnoses:  Hypertension, unspecified type  ESRD (end stage renal disease) (HCC)  Hypokalemia  Acute nonintractable headache, unspecified headache type    Medications  irbesartan (AVAPRO) tablet 150 mg (150 mg Oral Given 08/19/20 1932)  NIFEdipine (PROCARDIA-XL/NIFEDICAL-XL) 24 hr tablet 90 mg (90 mg Oral Given 08/19/20 1932)  potassium chloride SA (KLOR-CON) CR tablet 20 mEq (20 mEq Oral Given 08/19/20 1556)  acetaminophen (TYLENOL) tablet 1,000 mg (1,000 mg Oral Given 08/19/20 1555)  prochlorperazine (COMPAZINE) injection 10 mg (10 mg Intravenous Given 08/19/20 1556)  diphenhydrAMINE (BENADRYL) injection 25 mg (25 mg Intravenous Given 08/19/20 1636)  carvedilol (COREG) tablet 12.5 mg (12.5 mg Oral Given 08/19/20 1635)     ED Discharge Orders    None       Note:  This document was prepared using Dragon voice recognition software and may include unintentional dictation errors.   Lucrezia Starch, MD 08/20/20 [phone]

## 2020-09-25 ENCOUNTER — Emergency Department: Payer: Medicaid Other

## 2020-09-25 ENCOUNTER — Other Ambulatory Visit: Payer: Self-pay

## 2020-09-25 ENCOUNTER — Observation Stay
Admission: EM | Admit: 2020-09-25 | Discharge: 2020-09-26 | Disposition: A | Discharge status: Home / Self Care | Payer: Medicaid Other | Attending: Internal Medicine | Admitting: Internal Medicine

## 2020-09-25 DIAGNOSIS — N189 Chronic kidney disease, unspecified: Secondary | ICD-10-CM | POA: Diagnosis present

## 2020-09-25 DIAGNOSIS — Z79899 Other long term (current) drug therapy: Secondary | ICD-10-CM | POA: Diagnosis not present

## 2020-09-25 DIAGNOSIS — I160 Hypertensive urgency: Secondary | ICD-10-CM

## 2020-09-25 DIAGNOSIS — G40909 Epilepsy, unspecified, not intractable, without status epilepticus: Secondary | ICD-10-CM

## 2020-09-25 DIAGNOSIS — N186 End stage renal disease: Secondary | ICD-10-CM | POA: Diagnosis not present

## 2020-09-25 DIAGNOSIS — I10 Essential (primary) hypertension: Secondary | ICD-10-CM | POA: Diagnosis not present

## 2020-09-25 DIAGNOSIS — M329 Systemic lupus erythematosus, unspecified: Secondary | ICD-10-CM | POA: Diagnosis present

## 2020-09-25 DIAGNOSIS — Z992 Dependence on renal dialysis: Secondary | ICD-10-CM

## 2020-09-25 DIAGNOSIS — M3214 Glomerular disease in systemic lupus erythematosus: Secondary | ICD-10-CM | POA: Diagnosis not present

## 2020-09-25 DIAGNOSIS — F12188 Cannabis abuse with other cannabis-induced disorder: Secondary | ICD-10-CM

## 2020-09-25 DIAGNOSIS — R519 Headache, unspecified: Secondary | ICD-10-CM

## 2020-09-25 DIAGNOSIS — I2699 Other pulmonary embolism without acute cor pulmonale: Secondary | ICD-10-CM | POA: Diagnosis present

## 2020-09-25 DIAGNOSIS — R0600 Dyspnea, unspecified: Secondary | ICD-10-CM

## 2020-09-25 DIAGNOSIS — F17210 Nicotine dependence, cigarettes, uncomplicated: Secondary | ICD-10-CM | POA: Diagnosis not present

## 2020-09-25 DIAGNOSIS — I6783 Posterior reversible encephalopathy syndrome: Secondary | ICD-10-CM

## 2020-09-25 DIAGNOSIS — N179 Acute kidney failure, unspecified: Secondary | ICD-10-CM | POA: Diagnosis present

## 2020-09-25 DIAGNOSIS — Z20822 Contact with and (suspected) exposure to covid-19: Secondary | ICD-10-CM | POA: Diagnosis not present

## 2020-09-25 DIAGNOSIS — D696 Thrombocytopenia, unspecified: Secondary | ICD-10-CM | POA: Diagnosis present

## 2020-09-25 DIAGNOSIS — N2581 Secondary hyperparathyroidism of renal origin: Secondary | ICD-10-CM | POA: Diagnosis present

## 2020-09-25 LAB — TROPONIN I (HIGH SENSITIVITY)
Troponin I (High Sensitivity): 21 ng/L — ABNORMAL HIGH (ref <18)
Troponin I (High Sensitivity): 23 ng/L — ABNORMAL HIGH (ref <18)

## 2020-09-25 LAB — CBC
HCT: 42.9 % (ref 36.0–46.0)
Hemoglobin: 14.3 g/dL (ref 12.0–15.0)
MCH: 30.6 pg (ref 26.0–34.0)
MCHC: 33.3 g/dL (ref 30.0–36.0)
MCV: 91.7 fL (ref 80.0–100.0)
Platelets: 45 10*3/uL — ABNORMAL LOW (ref 150–400)
RBC: 4.68 MIL/uL (ref 3.87–5.11)
RDW: 14.1 % (ref 11.5–15.5)
WBC: 3.9 10*3/uL — ABNORMAL LOW (ref 4.0–10.5)
nRBC: 0 % (ref 0.0–0.2)

## 2020-09-25 LAB — BASIC METABOLIC PANEL
Anion gap: 15 (ref 5–15)
BUN: 49 mg/dL — ABNORMAL HIGH (ref 6–20)
CO2: 22 mmol/L (ref 22–32)
Calcium: 9.3 mg/dL (ref 8.9–10.3)
Chloride: 97 mmol/L — ABNORMAL LOW (ref 98–111)
Creatinine, Ser: 11.32 mg/dL — ABNORMAL HIGH (ref 0.44–1.00)
GFR, Estimated: 4 mL/min — ABNORMAL LOW (ref >60)
Glucose, Bld: 98 mg/dL (ref 70–99)
Potassium: 3.5 mmol/L (ref 3.5–5.1)
Sodium: 134 mmol/L — ABNORMAL LOW (ref 135–145)

## 2020-09-25 LAB — RESP PANEL BY RT-PCR (FLU A&B, COVID) ARPGX2
Influenza A by PCR: NEGATIVE
Influenza B by PCR: NEGATIVE
SARS Coronavirus 2 by RT PCR: NEGATIVE

## 2020-09-25 LAB — CBC WITH DIFFERENTIAL/PLATELET
Abs Immature Granulocytes: 0.01 10*3/uL (ref 0.00–0.07)
Basophils Absolute: 0 10*3/uL (ref 0.0–0.1)
Basophils Relative: 1 %
Eosinophils Absolute: 0.1 10*3/uL (ref 0.0–0.5)
Eosinophils Relative: 2 %
HCT: 39.5 % (ref 36.0–46.0)
Hemoglobin: 12.5 g/dL (ref 12.0–15.0)
Immature Granulocytes: 0 %
Lymphocytes Relative: 17 %
Lymphs Abs: 0.7 10*3/uL (ref 0.7–4.0)
MCH: 29.6 pg (ref 26.0–34.0)
MCHC: 31.6 g/dL (ref 30.0–36.0)
MCV: 93.4 fL (ref 80.0–100.0)
Monocytes Absolute: 0.4 10*3/uL (ref 0.1–1.0)
Monocytes Relative: 11 %
Neutro Abs: 2.9 10*3/uL (ref 1.7–7.7)
Neutrophils Relative %: 69 %
Platelets: 44 10*3/uL — ABNORMAL LOW (ref 150–400)
RBC: 4.23 MIL/uL (ref 3.87–5.11)
RDW: 13.9 % (ref 11.5–15.5)
WBC: 4.2 10*3/uL (ref 4.0–10.5)
nRBC: 0 % (ref 0.0–0.2)

## 2020-09-25 LAB — HCG, QUANTITATIVE, PREGNANCY: hCG, Beta Chain, Quant, S: 3 m[IU]/mL (ref <5)

## 2020-09-25 MED ORDER — SODIUM CHLORIDE 0.9% FLUSH
3.0000 mL | Freq: Two times a day (BID) | INTRAVENOUS | Status: DC
  Administered 2020-09-26: 08:00:00 3 mL via INTRAVENOUS

## 2020-09-25 MED ORDER — PROCHLORPERAZINE EDISYLATE 10 MG/2ML IJ SOLN
10.0000 mg | Freq: Once | INTRAMUSCULAR | Status: AC
  Administered 2020-09-25: 17:00:00 10 mg via INTRAVENOUS
  Filled 2020-09-25: qty 2

## 2020-09-25 MED ORDER — RENA-VITE PO TABS
1.0000 | ORAL_TABLET | Freq: Every day | ORAL | Status: DC
  Administered 2020-09-25: 22:00:00 1 via ORAL
  Filled 2020-09-25: qty 1

## 2020-09-25 MED ORDER — LEVETIRACETAM 500 MG PO TABS
500.0000 mg | ORAL_TABLET | ORAL | Status: AC
  Administered 2020-09-25: 15:00:00 500 mg via ORAL
  Filled 2020-09-25: qty 1

## 2020-09-25 MED ORDER — CINACALCET HCL 30 MG PO TABS
30.0000 mg | ORAL_TABLET | Freq: Every day | ORAL | Status: DC
  Filled 2020-09-25: qty 1

## 2020-09-25 MED ORDER — OXYCODONE HCL 5 MG PO TABS: 5.0000 mg | ORAL_TABLET | ORAL | Status: DC | PRN

## 2020-09-25 MED ORDER — LABETALOL HCL 5 MG/ML IV SOLN
10.0000 mg | Freq: Once | INTRAVENOUS | Status: AC
  Administered 2020-09-25: 18:00:00 10 mg via INTRAVENOUS
  Filled 2020-09-25: qty 4

## 2020-09-25 MED ORDER — LEVETIRACETAM 500 MG PO TABS
500.0000 mg | ORAL_TABLET | Freq: Two times a day (BID) | ORAL | Status: DC
  Administered 2020-09-25 – 2020-09-26 (×2): 500 mg via ORAL
  Filled 2020-09-25 (×2): qty 1

## 2020-09-25 MED ORDER — IRBESARTAN 150 MG PO TABS
150.0000 mg | ORAL_TABLET | ORAL | Status: AC
  Administered 2020-09-25: 14:00:00 150 mg via ORAL
  Filled 2020-09-25: qty 1

## 2020-09-25 MED ORDER — ONDANSETRON HCL 4 MG/2ML IJ SOLN: 4.0000 mg | INTRAMUSCULAR | Status: DC

## 2020-09-25 MED ORDER — NIFEDIPINE ER OSMOTIC RELEASE 30 MG PO TB24
90.0000 mg | ORAL_TABLET | ORAL | Status: AC
  Administered 2020-09-25: 14:00:00 90 mg via ORAL
  Filled 2020-09-25: qty 3

## 2020-09-25 MED ORDER — ACETAMINOPHEN 650 MG RE SUPP: 650.0000 mg | Freq: Four times a day (QID) | RECTAL | Status: DC | PRN

## 2020-09-25 MED ORDER — SEVELAMER CARBONATE 800 MG PO TABS
1600.0000 mg | ORAL_TABLET | Freq: Three times a day (TID) | ORAL | Status: DC
  Administered 2020-09-26 (×2): 1600 mg via ORAL
  Filled 2020-09-25 (×2): qty 2

## 2020-09-25 MED ORDER — TRAZODONE HCL 50 MG PO TABS: 25.0000 mg | ORAL_TABLET | Freq: Every evening | ORAL | Status: DC | PRN

## 2020-09-25 MED ORDER — CARVEDILOL 25 MG PO TABS
37.5000 mg | ORAL_TABLET | Freq: Two times a day (BID) | ORAL | Status: DC
  Administered 2020-09-26 (×2): 37.5 mg via ORAL
  Filled 2020-09-25 (×2): qty 1

## 2020-09-25 MED ORDER — CALCITRIOL 0.25 MCG PO CAPS
0.5000 ug | ORAL_CAPSULE | Freq: Every day | ORAL | Status: DC
  Administered 2020-09-26: 08:00:00 0.5 ug via ORAL
  Filled 2020-09-25 (×2): qty 2

## 2020-09-25 MED ORDER — DIPHENHYDRAMINE HCL 50 MG/ML IJ SOLN
25.0000 mg | Freq: Once | INTRAMUSCULAR | Status: AC
  Administered 2020-09-25: 17:00:00 25 mg via INTRAVENOUS
  Filled 2020-09-25: qty 1

## 2020-09-25 MED ORDER — IRBESARTAN 150 MG PO TABS
150.0000 mg | ORAL_TABLET | Freq: Every day | ORAL | Status: DC
  Administered 2020-09-26: 08:00:00 150 mg via ORAL
  Filled 2020-09-25: qty 1

## 2020-09-25 MED ORDER — LOSARTAN POTASSIUM 50 MG PO TABS
50.0000 mg | ORAL_TABLET | ORAL | Status: AC
  Administered 2020-09-25: 14:00:00 50 mg via ORAL
  Filled 2020-09-25: qty 1

## 2020-09-25 MED ORDER — POLYETHYLENE GLYCOL 3350 17 G PO PACK: 17.0000 g | PACK | Freq: Every day | ORAL | Status: DC | PRN

## 2020-09-25 MED ORDER — NIFEDIPINE ER OSMOTIC RELEASE 90 MG PO TB24
90.0000 mg | ORAL_TABLET | Freq: Every day | ORAL | Status: DC
  Filled 2020-09-25: qty 1

## 2020-09-25 MED ORDER — CARVEDILOL 6.25 MG PO TABS
12.5000 mg | ORAL_TABLET | ORAL | Status: AC
  Administered 2020-09-25: 14:00:00 12.5 mg via ORAL
  Filled 2020-09-25: qty 2

## 2020-09-25 MED ORDER — METHOCARBAMOL 1000 MG/10ML IJ SOLN
500.0000 mg | Freq: Four times a day (QID) | INTRAVENOUS | Status: DC | PRN
  Filled 2020-09-25: qty 5

## 2020-09-25 MED ORDER — ACETAMINOPHEN 325 MG PO TABS: 650.0000 mg | ORAL_TABLET | Freq: Four times a day (QID) | ORAL | Status: DC | PRN

## 2020-09-25 MED ORDER — ONDANSETRON HCL 4 MG/2ML IJ SOLN: 4.0000 mg | Freq: Four times a day (QID) | INTRAMUSCULAR | Status: DC | PRN

## 2020-09-25 MED ORDER — NIFEDIPINE ER OSMOTIC RELEASE 30 MG PO TB24
90.0000 mg | ORAL_TABLET | Freq: Every day | ORAL | Status: DC
  Administered 2020-09-26: 08:00:00 90 mg via ORAL
  Filled 2020-09-25: qty 3

## 2020-09-25 MED ORDER — ONDANSETRON HCL 4 MG PO TABS: 4.0000 mg | ORAL_TABLET | Freq: Four times a day (QID) | ORAL | Status: DC | PRN

## 2020-09-25 NOTE — ED Notes (Signed)
Pt assisted to the bathroom at this time. Pt ambulatory with steady gait

## 2020-09-25 NOTE — ED Notes (Signed)
Pt sitting up in bed awake and alert-- oriented x4.  NADN.  Pt reports HA significantly improved since arrival  - rated 5/10 and described as dull.  BP remains elevated (current bp 152/114 - all other VSS).  Pt updated on plan for admission and transfer to room assignment within 7min to 1 hr -- pt remains agreeable with plan- denies any additional questions, concerns, needs at this time.

## 2020-09-25 NOTE — ED Notes (Addendum)
This RN at bedside to answer call bell. Pt stating she suddenly started to feel diaphoretic and states she feels worse. Pt also c/o SOB. Pt VSS except BP remains hypertensive. MD made aware.

## 2020-09-25 NOTE — ED Triage Notes (Signed)
Pt to ED via [facility] from dialysis center. Per EMS pt unable to complete dialysis tx due to HA and HTN. Pt scheduled MWF for dialysis but has been unable to complete tx for the last week due to not feeling well. Pt stating last full tx was Friday. Pt initial BP 194/133. Pt c/o HA, N/V, left sided CP, SOB.

## 2020-09-25 NOTE — H&P (Signed)
[facility] History and Physical  IKEA DEMICCO PPJ:[id number] DOB: May 20, 1981 DOA: 09/25/2020  Referring physician: Dr. Charna Archer PCP: Letta Median, MD   Chief Complaint: nausea, vomiting, headache  HPI: Patricia Maldonado is a 39 y.o. 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  On my history patient reports that she has been feeling unwell for about 3 weeks. Her main symptoms have been headache, nausea, and vomiting. She endorses using marijuana and says that her nausea and vomiting are improved with taking hot steaming showers. She reports that her last full dialysis session was a week ago last Friday. On Monday she received less than the full session though she is unsure how much and was stopped early due to unwell. The same thing occurred on Wednesday after less than an hour of her dialysis run. Today she presented for dialysis and after about 20 minutes began to have nausea and vomiting, her pressures were noted to be elevated and staff sent her to the ED to be evaluated. She reports she has not been taking her medications recently as she ran out several weeks ago and only heard from the pharmacy about a refill today.  View of chart notable for admission in August 2021. During that admission patient arrived to the ED and had witnessed tonic-clonic seizure. She was subsequently diagnosed with PRES.  In the ED patient's vital signs notable for significant hypertension with systolics initially ranging 160s to 180s over 120s. Labs notable for BMP with normal potassium of 3.5 and creatinine of 11, troponin barely positive and flat over 2-hour trend at 21-23. CBC notable for platelets of 45 which was confirmed on recheck but CBC otherwise unremarkable. Respiratory viral  panel was negative for influenza and Covid. Chest x-ray was unremarkable. CT head was unremarkable.  She was admitted for further management of her hypertension and end-stage renal disease.  Review of Systems:  Pertinent positives and negative per HPI, all others reviewed and negative  Past Medical History:  Diagnosis Date  . Acute renal failure (ARF) ([place])   . Anemia   . Arthritis   . History of kidney stones   . Hypertension    OFF BP MEDS X 3 WEEKS DUE TO BP CONTROL  . Lupus nephritis ([place]) 12/2016   DX DURING HOSPITAL ADMISSION  . Rash   . Staghorn calculus   . 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[place]) 10/2016   DX DURING HOSPITAL ADMISSION   Past Surgical History:  Procedure Laterality Date  . CYSTOSCOPY/URETEROSCOPY/HOLMIUM LASER/STENT PLACEMENT Right 02/21/2017   Procedure: CYSTOSCOPY/URETEROSCOPY/HOLMIUM LASER/STENT EXCHANGE;  Surgeon: [name], MD;  Location: [facility];  Service: Urology;  Laterality: Right;  . DIALYSIS/PERMA CATHETER INSERTION N/A 11/30/2018   Procedure: DIALYSIS/PERMA CATHETER INSERTION;  Surgeon: Algernon Huxley, MD;  Location: [facility];  Service: Cardiovascular;  Laterality: N/A;  . DIALYSIS/PERMA CATHETER INSERTION N/A 07/07/2020   Procedure: DIALYSIS/PERMA CATHETER INSERTION;  Surgeon: Katha Cabal, MD;  Location: [facility];  Service: Cardiovascular;  Laterality: N/A;  . IR NEPHROSTOMY PLACEMENT RIGHT  01/23/2017  . NEPHROLITHOTOMY Right 01/24/2017   Procedure: NEPHROLITHOTOMY PERCUTANEOUS;  Surgeon: [name], MD;  Location: [facility];  Service: Urology;  Laterality: Right;  . PERITONEAL CATHETER INSERTION    . TUBAL LIGATION     Social History:  reports that she has been smoking cigarettes.  She has a 3.75 pack-year smoking history. She has never used smokeless tobacco. She reports current drug use. Drug: Marijuana. She reports that she does not drink alcohol.  Allergies  Allergen Reactions  . Penicillins Swelling    Yeast  infection Has patient had a PCN reaction causing immediate rash, facial/tongue/throat swelling, SOB or lightheadedness with hypotension:No Has patient had a PCN reaction causing severe rash involving mucus membranes or skin necrosis: Yes Has patient had a PCN reaction that required hospitalization No Has patient had a PCN reaction occurring within the last 10 years: No If all of the above answers are "NO", then may proceed with Cephalosporin use.  Yeast infection Has patient had a PCN reaction causing immediate rash, facial/tongue/throat swelling, SOB or lightheadedness with hypotension:No Has patient had a PCN reaction causing severe rash involving mucus membranes or skin necrosis: Yes Has patient had a PCN reaction that required hospitalization No Has patient had a PCN reaction occurring within the last 10 years: No If all of the above answers are "NO", then may proceed with Cephalosporin use.    Family History  Problem Relation Age of Onset  . Thyroid disease Mother   . Thyroid disease Father   . Diabetes Father      Prior to Admission medications   Medication Sig Start Date End Date Taking? Authorizing Provider  B Complex-C-Zn-Folic Acid (DIALYVITE/ZINC) TABS Take 1 tablet by mouth at bedtime. 05/27/20  Yes [provider]  calcitRIOL (ROCALTROL) 0.5 MCG capsule 2 Capsule(s) By Mouth Daily   Yes [provider]  carvedilol (COREG) 12.5 MG tablet tablet Take 3 tablets (37.5 mg total) by mouth two (2) times a day 04/25/20  Yes [provider]  cinacalcet (SENSIPAR) 30 MG tablet Take 30 mg by mouth daily.   Yes [provider]  citalopram (CELEXA) 10 MG tablet Take 10 mg by mouth daily.   Yes [provider]  hydroxychloroquine (PLAQUENIL) 200 MG tablet  04/25/20  Yes [provider]  hydroxyurea (DROXIA) 200 MG capsule Take 200 mg by mouth daily.   Yes [provider]  levETIRAcetam (KEPPRA) 500 MG tablet TAKE 1 TABLET BY  MOUTH TWICE DAILY WITH ADDITIONAL 1 TABLET AFTER DIALYSIS   Yes [provider]  mycophenolate (CELLCEPT) 500 MG tablet Take 2 tablets (1,000 mg total) by mouth 2 (two) times daily. 04/30/18  Yes Sudini, Alveta Heimlich, MD  NIFEdipine (PROCARDIA XL/NIFEDICAL-XL) 90 MG 24 hr tablet Take 90 mg by mouth daily. 04/25/20  Yes [provider]  ondansetron (ZOFRAN-ODT) 4 MG disintegrating tablet Take 4 mg by mouth every 6 (six) hours as needed for nausea/vomiting. 09/09/20  Yes [provider]  prochlorperazine (COMPAZINE) 10 MG tablet Take 1 tablet (10 mg total) by mouth every 6 (six) hours as needed for up to 15 doses (30 Min prior to dialysis.). 05/10/20  Yes Para Skeans, MD  sevelamer carbonate (RENVELA) 800 MG tablet Take 1,600 mg by mouth 3 (three) times daily. 07/24/20  Yes [provider]  valsartan (DIOVAN) 160 MG tablet Take 160 mg by mouth daily.   Yes [provider]  gentamicin cream (GARAMYCIN) 0.1 % APPLY TO EXIT SITE DAILY Patient not taking: Reported on 07/07/2020    [provider]   Physical Exam: Vitals:   09/25/20 1830 09/25/20 1845 09/25/20 1900 09/25/20 1920  BP: (!) 155/123  (!) 141/108 (!) 152/114  Pulse: (!) 104 95 96 (!) 106  Resp: 18     Temp:  98.3 F (36.8 C)  TempSrc:    Oral  SpO2: 100% 100% 100% 100%  Weight:      Height:        Wt Readings from Last 3 Encounters:  09/25/20 46.3 kg  08/19/20 49 kg  07/07/20 44.9 kg     . General:  Appears calm and comfortable . Eyes: PERRL, normal lids, irises & conjunctiva . ENT: grossly normal hearing, lips & tongue . Neck: no LAD, masses or thyromegaly . Cardiovascular: RRR, no m/r/g. No LE edema. Marland Kitchen Respiratory: CTA bilaterally, no w/r/r. Normal respiratory effort. . Abdomen: soft, ntnd . Skin: no rash or induration seen on limited exam . Musculoskeletal: grossly normal tone BUE/BLE . Psychiatric: grossly normal mood and affect, speech fluent and  appropriate . Neurologic: grossly non-focal.          Labs on Admission:  Basic Metabolic Panel: Recent Labs  Lab 09/25/20 1304  NA 134*  K 3.5  CL 97*  CO2 22  GLUCOSE 98  BUN 49*  CREATININE 11.32*  CALCIUM 9.3   Liver Function Tests: No results for input(s): AST, ALT, ALKPHOS, BILITOT, PROT, ALBUMIN in the last 168 hours. No results for input(s): LIPASE, AMYLASE in the last 168 hours. No results for input(s): AMMONIA in the last 168 hours. CBC: Recent Labs  Lab 09/25/20 1304 09/25/20 1815  WBC 3.9* 4.2  NEUTROABS  --  2.9  HGB 14.3 12.5  HCT 42.9 39.5  MCV 91.7 93.4  PLT 45* 44*   Cardiac Enzymes: No results for input(s): CKTOTAL, CKMB, CKMBINDEX, TROPONINI in the last 168 hours.  BNP (last 3 results) No results for input(s): BNP in the last 8760 hours.  ProBNP (last 3 results) No results for input(s): PROBNP in the last 8760 hours.  CBG: No results for input(s): GLUCAP in the last 168 hours.  Radiological Exams on Admission: DG Chest 2 View  Result Date: 09/25/2020 CLINICAL DATA:  Left-sided chest pain with difficulty breathing 1 week. EXAM: CHEST - 2 VIEW COMPARISON:  None. FINDINGS: Right IJ central venous catheter unchanged with tip over the right atrium. Lungs are adequately inflated and otherwise clear. Cardiomediastinal silhouette and remainder of the exam is unchanged. IMPRESSION: No active cardiopulmonary disease. Electronically Signed   By: Marin Olp M.D.   On: 09/25/2020 15:54   CT Head Wo Contrast  Result Date: 09/25/2020 CLINICAL DATA:  Head trauma, headache, hypertension, missed dialysis EXAM: CT HEAD WITHOUT CONTRAST TECHNIQUE: Contiguous axial images were obtained from the base of the skull through the vertex without intravenous contrast. COMPARISON:  08/19/2020 FINDINGS: Brain: No evidence of acute infarction, hemorrhage, hydrocephalus, extra-axial collection or mass lesion/mass effect. Vascular: No hyperdense vessel or unexpected  calcification. Skull: Normal. Negative for fracture or focal lesion. Sinuses/Orbits: No acute finding. Other: None. IMPRESSION: No acute intracranial pathology. Electronically Signed   By: Eddie Candle M.D.   On: 09/25/2020 16:07    EKG: Independently reviewed. Sinus rhythm, no ischemic changes, unremarkable EKG.  Assessment/Plan Active Problems:   Lupus nephritis (HCC)   Systemic lupus erythematosus (HCC)   PE (pulmonary thromboembolism) (HCC)   ESRD on hemodialysis (HCC)   Essential hypertension   Secondary hyperparathyroidism (HCC)   Thrombocytopenia (HCC)   Seizure disorder (HCC)   Acute on chronic kidney failure (HCC)   Posterior reversible encephalopathy syndrome (PRES)   Patricia Maldonado is a 39 y.o. 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  #Headache #Nausea and vomiting #End-stage renal disease #Lupus nephritis #Cannabis hyperemesis syndrome Patient presenting with vague symptoms of headache and nausea vomiting.  No signs of press as when she last presented to the hospital.  Symptoms are likely due to combination of her lack of dialysis over the past week as well as cannabis hyperemesis syndrome.  Counseled on cannabis cessation.  We will try to dialyze as soon as possible and monitor symptoms. -Consult renal for assistance with dialysis -Continue calcitriol, cinacalcet, multivitamins, [place] sevelamer  #Hypertension Likely secondary to combination of lack of proper dialysis for over a week and not having her home medications for the past 3 weeks. Has been given all of her home medications and does not currently have any concerning signs to suggest a repeat episode of press syndrome. Will allow her blood pressure to improve with home medications and  dialyze her as quickly as possible. -Continue carvedilol, nifedipine, valsartan -May need to add hydralazine if still uncontrolled after dialysis  #Thrombocytopenia #Lupus Review of chart shows the patient has had thrombocytopenia intermittently dating back to the end of 2020.  Has occasionally dips as low as the 30s.  Review of the chart show she had an outpatient consultation with hematology on August 19 and 20, 2020.  At that time note does not elucidate what the etiology is thought to be but hematology did not recommend any acute intervention as her platelets were stable.  Possibilities include her lupus, she is also on many disease modifying agents which are associated with thrombocytopenia.  We will hold some of her medications for now pending possible hematology consult in a.m. -Hold mycophenolate, hydroxychloroquine, hydroxyurea -Trend CBC  #Chronic medical problems Seizure disorder: Continue Keppra 500 twice daily Depression: Patient reports she is not taking citalopram  Code Status: Full Code, confirmed DVT Prophylaxis: SCDs, lovenox held in setting of significant thrombocytopenia Family Communication: None Disposition Plan: Admit to inpatient   Time spent: 50 min  Clarnce Flock MD/[facility]

## 2020-09-25 NOTE — ED Provider Notes (Signed)
[facility] Emergency Department Provider Note   ____________________________________________   Event Date/Time   First MD Initiated Contact with Patient 09/25/20 1306     (approximate)  I have reviewed the triage vital signs and the nursing notes.   HISTORY  Chief Complaint Headache and Hypertension    HPI Benita S Seydel is a 39 y.o. female history of end-stage renal disease and lupus   Patient reports that she has not been able to get her medications for about a month.  She also reports has been trying to get these refilled to her doctor but they have not responded to her online requests except now [facility] but she does not have transportation to get them and they no longer delivered to her  She has not been on any blood pressure medicine for several weeks.  She reports she is having the same headache she has had about a month now and it gets worse when she does her dialysis.  It is a throbbing bilateral headache.  Associated with nausea and vomiting when it severe.  When she has her hemodialysis sessions she will get nauseated and began to vomit.  Associated with this she is also had some chest pain over the left side sharp at times.  She reports has had all the same symptoms and they come about when she has bouts of high blood pressure  She did have dialysis this week on Monday Wednesday and started today but she reports she has not been able to complete any of those sessions due to severity of headache and symptoms.  She had a similar ER visit about a month ago for same  No numbness or weakness.  Past Medical History:  Diagnosis Date  . Acute renal failure (ARF) ([place])   . Anemia   . Arthritis   . History of kidney stones   . Hypertension    OFF BP MEDS X 3 WEEKS DUE TO BP CONTROL  . 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 Rash   . Staghorn calculus   . 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[place])  10/2016   [place] ADMISSION    Patient Active Problem List   Diagnosis Date Noted  . Blurred vision, bilateral 05/08/2020  . Seizures ([place]) 05/06/2020  . Cerebral edema ([place]) 12/23/2019  . Seizure disorder ([place]) 12/20/2019  . Acute repetitive seizure ([place]) 12/19/2019  . History of Closed sacral fracture ([place]) 12/19/2019  . Thrombocytopenia ([place]) 12/19/2019  . Anemia, unspecified 04/28/2019  . ESRD on hemodialysis ([place]) 02/21/2019  . Essential hypertension 02/21/2019  . CKD (chronic kidney disease), stage V ([place]) 09/12/2018  . Metabolic acidosis 19/37/9024  . Secondary hyperparathyroidism ([place]) 09/12/2018  . Acute on chronic renal failure ([place]) 04/26/2018  . Acute kidney injury ([place]) 07/27/2017  . Alopecia of scalp 03/08/2017  . Thyroid nodule 03/08/2017  . PE (pulmonary thromboembolism) ([place]) 03/01/2017  . Kidney stone on left side 01/23/2017  . Systemic lupus erythematosus ([place]) 12/08/2016  . Encounter for long-term (current) use of high-risk medication 12/08/2016  . 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 Acute renal failure (ARF) ([place]) 10/04/2016  . Nephrolithiasis   . Staghorn calculus   . Acute renal failure with tubular necrosis ([place]) 08/30/2016    Past Surgical History:  Procedure Laterality Date  . CYSTOSCOPY/URETEROSCOPY/HOLMIUM LASER/STENT PLACEMENT Right 02/21/2017   Procedure: CYSTOSCOPY/URETEROSCOPY/HOLMIUM LASER/STENT EXCHANGE;  Surgeon: [name], MD;  Location: [facility] ORS;  Service: Urology;  Laterality: Right;  . DIALYSIS/PERMA CATHETER INSERTION N/A 11/30/2018   Procedure: DIALYSIS/PERMA CATHETER INSERTION;  Surgeon: Algernon Huxley, MD;  Location: [place] CV LAB;  Service: Cardiovascular;  Laterality: N/A;  . DIALYSIS/PERMA CATHETER INSERTION N/A 07/07/2020   Procedure: DIALYSIS/PERMA CATHETER INSERTION;  Surgeon: Katha Cabal, MD;  Location: [place] [facility];  Service: Cardiovascular;  Laterality: N/A;  .  IR NEPHROSTOMY PLACEMENT RIGHT  01/23/2017  . NEPHROLITHOTOMY Right 01/24/2017   Procedure: NEPHROLITHOTOMY PERCUTANEOUS;  Surgeon: [name], MD;  Location: [facility] ORS;  Service: Urology;  Laterality: Right;  . PERITONEAL CATHETER INSERTION    . TUBAL LIGATION      Prior to Admission medications   Medication Sig Start Date End Date Taking? Authorizing Provider  calcitRIOL (ROCALTROL) 0.5 MCG capsule 2 Capsule(s) By Mouth Daily    [provider]  carvedilol (COREG) 12.5 MG tablet tablet Take 3 tablets (37.5 mg total) by mouth two (2) times a day 04/25/20   [provider]  cinacalcet (SENSIPAR) 30 MG tablet Take 30 mg by mouth daily.    [provider]  citalopram (CELEXA) 10 MG tablet Take 10 mg by mouth daily.    [provider]  gentamicin cream (GARAMYCIN) 0.1 % APPLY TO EXIT SITE DAILY Patient not taking: Reported on 07/07/2020    [provider]  hydroxychloroquine (PLAQUENIL) 200 MG tablet  04/25/20   [provider]  hydroxyurea (DROXIA) 200 MG capsule Take 200 mg by mouth daily.    [provider]  levETIRAcetam (KEPPRA) 500 MG tablet TAKE 1 TABLET BY MOUTH TWICE DAILY WITH ADDITIONAL 1 TABLET AFTER DIALYSIS    [provider]  mycophenolate (CELLCEPT) 500 MG tablet Take 2 tablets (1,000 mg total) by mouth 2 (two) times daily. 04/30/18   Hillary Bow, MD  NIFEdipine (PROCARDIA XL/NIFEDICAL-XL) 90 MG 24 hr tablet Take 90 mg by mouth daily. 04/25/20   [provider]  ondansetron (ZOFRAN) 4 MG tablet Take 1 tablet (4 mg total) by mouth daily as needed for nausea or vomiting. 07/04/20 07/04/21  Laban Emperor, PA-C  prochlorperazine (COMPAZINE) 10 MG tablet Take 1 tablet (10 mg total) by mouth every 6 (six) hours as needed for up to 15 doses (30 Min prior to dialysis.). 05/10/20   Para Skeans, MD  sevelamer (RENAGEL) 800 MG tablet Take by mouth 3 (three) times daily with meals.    [provider]   valsartan (DIOVAN) 160 MG tablet Take 160 mg by mouth daily.    [provider]    Allergies Penicillins  Family History  Problem Relation Age of Onset  . Thyroid disease Mother   . Thyroid disease Father   . Diabetes Father     Social History Social History   Tobacco Use  . Smoking status: Current Every Day Smoker    Packs/day: 0.25    Years: 15.00    Pack years: 3.75    Types: Cigarettes  . Smokeless tobacco: Never Used  Vaping Use  . Vaping Use: Never used  Substance Use Topics  . Alcohol use: No  . Drug use: Yes    Types: Marijuana    Comment: OCC    Review of Systems Constitutional: No fever/chills Eyes: No visual changes. ENT: No sore throat. Cardiovascular: Denies chest pain symptom occasional aching across her left upper chest, reports this is been present in the past and happens whenever her blood pressure is high and does  not radiate. Respiratory: Denies shortness of breath. Gastrointestinal: No abdominal pain.   Genitourinary: Negative for dysuria. Musculoskeletal: Negative for back pain. Skin: Negative for rash. Neurological: Negative for areas of focal weakness or numbness.    ____________________________________________   PHYSICAL EXAM:  VITAL SIGNS: ED Triage Vitals  Enc Vitals Group     BP 09/25/20 1300 (!) 169/125     Pulse Rate 09/25/20 1300 97     Resp 09/25/20 1300 15     Temp 09/25/20 1300 98 F (36.7 C)     Temp Source 09/25/20 1300 Oral     SpO2 09/25/20 1300 100 %     Weight 09/25/20 1300 102 lb (46.3 kg)     Height 09/25/20 1300 5\' 2"  (1.575 m)     Head Circumference --      Peak Flow --      Pain Score 09/25/20 1301 9     Pain Loc --      Pain Edu? --      Excl. in [place]? --     Constitutional: Alert and oriented. Well appearing and in no acute distress.  She is very pleasant. Eyes: Conjunctivae are normal. Head: Atraumatic. Nose: No congestion/rhinnorhea. Mouth/Throat: Mucous membranes are moist. Neck: No  stridor.  JVD present. Cardiovascular: Normal rate, regular rhythm. Grossly normal heart sounds.  Good peripheral circulation. Respiratory: Normal respiratory effort.  No retractions. Lungs CTAB. Gastrointestinal: Soft and nontender. No distention. Musculoskeletal: No lower extremity tenderness nor edema.  Moves all extremities equally to command.  No noted deficits. Neurologic:  Normal speech and language. No gross focal neurologic deficits are appreciated.  Cranial nerve exam normal. Skin:  Skin is warm, dry and intact. No rash noted. Psychiatric: Mood and affect are normal. Speech and behavior are normal.  ____________________________________________   LABS (all labs ordered are listed, but only abnormal results are displayed)  Labs Reviewed  BASIC METABOLIC PANEL - Abnormal; Notable for the following components:      Result Value   Sodium 134 (*)    Chloride 97 (*)    BUN 49 (*)    Creatinine, Ser 11.32 (*)    GFR, Estimated 4 (*)    All other components within normal limits  CBC - Abnormal; Notable for the following components:   WBC 3.9 (*)    Platelets 45 (*)    All other components within normal limits  TROPONIN I (HIGH SENSITIVITY) - Abnormal; Notable for the following components:   Troponin I (High Sensitivity) 21 (*)    All other components within normal limits  TROPONIN I (HIGH SENSITIVITY) - Abnormal; Notable for the following components:   Troponin I (High Sensitivity) 23 (*)    All other components within normal limits  HCG, QUANTITATIVE, PREGNANCY  URINE DRUG SCREEN, QUALITATIVE ([facility] ONLY)  POC URINE PREG, ED  POC URINE PREG, ED   ____________________________________________  EKG  Reviewed entered by me at 1305 Heart rate 100 QRS 85 QTc 480 Normal sinus rhythm, possible old anterior infarct.  No evidence of acute ischemia  Patient's troponin reviewed, consistent with previous/historical  values ____________________________________________  RADIOLOGY    Discussed obtaining a CT of the head with the patient to evaluate for bleeding or hemorrhage or stroke.  However in discussion with the patient, she notes previous imaging and reports that she has had the exact same symptoms multiple times they always get better when her blood pressure improves.  Rather than obtaining a CT today she would rather we see  if her headache improves with blood pressure medications, will trial this and observe.  She does not have any noted deficits at this time  Due to persistent headache despite medication, CT of the head ordered.  Discussed with the patient who is in agreement.  Evaluate for acute abnormality or hemorrhage ____________________________________________   PROCEDURES  Procedure(s) performed: None  Procedures  Critical Care performed: No  ____________________________________________   INITIAL IMPRESSION / ASSESSMENT AND PLAN / ED COURSE  Pertinent labs & imaging results that were available during my care of the patient were reviewed by me and considered in my medical decision making (see chart for details).   Patient presents for a throbbing headache severe diastolic hypertension in the setting of medication noncompliance due to issues obtaining her medication which she reports is prescribed and ready but at her pharmacy.  Additionally she reports difficulty with completing her full hemodialysis runs.  No shortness of breath but is having some fleeting left-sided chest discomfort that she also associates with similar episodes of hypertension in the past.  We will provide her prescribed antihypertensives and observe.  Lab work is reviewed, case dsicussed with nephrology Dr. Murlean Iba who recommends symptomatic management, blood pressure management.   ----------------------------------------- 4:01 PM on 09/25/2020 ----------------------------------------- Ongoing care assigned  to Dr. Charna Archer.  Follow-up on pending chest x-ray and CT of the head.  Additionally follow-up clinical symptomatology especially as relates to nausea and headache which is the patient's primary chief complaint, suspect secondary to hypertension though further work-up is required.  Patient having persistent headache despite treatments with recommend admission for further management and nephrology consultation       ____________________________________________   FINAL CLINICAL IMPRESSION(S) / ED DIAGNOSES  Final diagnoses:  Headache, malignant hypertension        Note:  This document was prepared using Dragon voice recognition software and may include unintentional dictation errors       Delman Kitten, MD 09/25/20 1603

## 2020-09-25 NOTE — ED Provider Notes (Signed)
-----------------------------------------   3:24 PM on 09/25/2020 -----------------------------------------  Blood pressure (!) 159/120, pulse 98, temperature 98 F (36.7 C), temperature source Oral, resp. rate 19, height 5\' 2"  (1.575 m), weight 46.3 kg, last menstrual period 09/02/2020, SpO2 99 %.  Assuming care from Dr. Jacqualine Code.  In short, Patricia Maldonado is a 39 y.o. female with a chief complaint of Headache and Hypertension .  Refer to the original H&P for additional details.  The current plan of care is to follow-up head CT and recheck blood pressure after patient given usual home medications.  ----------------------------------------- 5:59 PM on 09/25/2020 -----------------------------------------  Patient with no improvement in blood pressure following her usual antihypertensives.  CT head reviewed by me shows no obvious hemorrhage, negative for acute process per radiology.  Chest x-ray is also unremarkable with no pulmonary edema.  Patient treated with migraine cocktail, continues to complain of nausea and headache with no change in her blood pressure.  We will treat with labetalol and case discussed with hospitalist for admission.    Blake Divine, MD 09/25/20 1800

## 2020-09-26 DIAGNOSIS — Z992 Dependence on renal dialysis: Secondary | ICD-10-CM | POA: Diagnosis not present

## 2020-09-26 DIAGNOSIS — F12188 Cannabis abuse with other cannabis-induced disorder: Secondary | ICD-10-CM

## 2020-09-26 DIAGNOSIS — I160 Hypertensive urgency: Secondary | ICD-10-CM

## 2020-09-26 DIAGNOSIS — R519 Headache, unspecified: Secondary | ICD-10-CM

## 2020-09-26 DIAGNOSIS — R0600 Dyspnea, unspecified: Secondary | ICD-10-CM | POA: Diagnosis not present

## 2020-09-26 DIAGNOSIS — N186 End stage renal disease: Secondary | ICD-10-CM | POA: Diagnosis not present

## 2020-09-26 LAB — URINE DRUG SCREEN, QUALITATIVE (ARMC ONLY)
Amphetamines, Ur Screen: NOT DETECTED
Barbiturates, Ur Screen: NOT DETECTED
Benzodiazepine, Ur Scrn: NOT DETECTED
Cannabinoid 50 Ng, Ur ~~LOC~~: POSITIVE — AB
Cocaine Metabolite,Ur ~~LOC~~: NOT DETECTED
MDMA (Ecstasy)Ur Screen: NOT DETECTED
Methadone Scn, Ur: NOT DETECTED
Opiate, Ur Screen: NOT DETECTED
Phencyclidine (PCP) Ur S: NOT DETECTED
Tricyclic, Ur Screen: NOT DETECTED

## 2020-09-26 LAB — BASIC METABOLIC PANEL
Anion gap: 15 (ref 5–15)
BUN: 54 mg/dL — ABNORMAL HIGH (ref 6–20)
CO2: 22 mmol/L (ref 22–32)
Calcium: 9.5 mg/dL (ref 8.9–10.3)
Chloride: 101 mmol/L (ref 98–111)
Creatinine, Ser: 13.99 mg/dL — ABNORMAL HIGH (ref 0.44–1.00)
GFR, Estimated: 3 mL/min — ABNORMAL LOW (ref >60)
Glucose, Bld: 93 mg/dL (ref 70–99)
Potassium: 3.7 mmol/L (ref 3.5–5.1)
Sodium: 138 mmol/L (ref 135–145)

## 2020-09-26 LAB — CBC
HCT: 39.9 % (ref 36.0–46.0)
Hemoglobin: 12.7 g/dL (ref 12.0–15.0)
MCH: 29.8 pg (ref 26.0–34.0)
MCHC: 31.8 g/dL (ref 30.0–36.0)
MCV: 93.7 fL (ref 80.0–100.0)
Platelets: 46 10*3/uL — ABNORMAL LOW (ref 150–400)
RBC: 4.26 MIL/uL (ref 3.87–5.11)
RDW: 13.8 % (ref 11.5–15.5)
WBC: 3.9 10*3/uL — ABNORMAL LOW (ref 4.0–10.5)
nRBC: 0 % (ref 0.0–0.2)

## 2020-09-26 LAB — PHOSPHORUS: Phosphorus: 7.1 mg/dL — ABNORMAL HIGH (ref 2.5–4.6)

## 2020-09-26 MED ORDER — NIFEDIPINE ER OSMOTIC RELEASE 90 MG PO TB24: 90.0000 mg | ORAL_TABLET | Freq: Every day | ORAL | 0 refills | Status: AC

## 2020-09-26 MED ORDER — CHLORHEXIDINE GLUCONATE CLOTH 2 % EX PADS: 6.0000 | MEDICATED_PAD | Freq: Every day | CUTANEOUS | Status: DC

## 2020-09-26 MED ORDER — CLONIDINE HCL 0.1 MG PO TABS
0.1000 mg | ORAL_TABLET | Freq: Once | ORAL | Status: AC
  Administered 2020-09-26: 16:00:00 0.1 mg via ORAL

## 2020-09-26 MED ORDER — CARVEDILOL 12.5 MG PO TABS: 37.5000 mg | ORAL_TABLET | Freq: Two times a day (BID) | ORAL | 1 refills | Status: AC

## 2020-09-26 MED ORDER — VALSARTAN 160 MG PO TABS: 160.0000 mg | ORAL_TABLET | Freq: Every day | ORAL | 0 refills | Status: AC

## 2020-09-26 NOTE — Progress Notes (Signed)
[facility], [place] 09/26/20  Subjective:   LOS: 1  Patient admitted for uncontrolled HTN Last HD was  Dec 20 States that she ran out of her blood pressure medications about a month ago She was not able to get to her pharmacy to pick up her medications No chest pain, shortness of breath Able to eat without nausea or vomiting  Objective:  Vital signs in last 24 hours:  Temp:  [98 F (36.7 C)-98.3 F (36.8 C)] 98.2 F (36.8 C) (12/25 0827) Pulse Rate:  [87-113] 97 (12/25 0501) Resp:  [14-27] 14 (12/25 0501) BP: (137-180)/(103-126) 137/103 (12/25 0501) SpO2:  [96 %-100 %] 99 % (12/25 0501) Weight:  [46 kg-46.3 kg] 46 kg (12/24 2205)  Weight change:  Filed Weights   09/25/20 1300 09/25/20 2205  Weight: 46.3 kg 46 kg    Intake/Output:    Intake/Output Summary (Last 24 hours) at 09/26/2020 0951 Last data filed at 09/26/2020 0500 Gross per 24 hour  Intake 240 ml  Output 0 ml  Net 240 ml    Physical Exam: General:  No acute distress, laying in the bed  HEENT  anicteric, moist oral mucous membrane  Pulm/lungs  normal breathing effort, lungs are clear to auscultation  CVS/Heart  regular rhythm, no rub or gallop  Abdomen:   Soft, nontender  Extremities:  No peripheral edema  Neurologic:  Alert, oriented, able to follow commands  Skin:  No acute rashes  Right IJ PermCath    Basic Metabolic Panel:  Recent Labs  Lab 09/25/20 1304 09/26/20 0316  NA 134* 138  K 3.5 3.7  CL 97* 101  CO2 22 22  GLUCOSE 98 93  BUN 49* 54*  CREATININE 11.32* 13.99*  CALCIUM 9.3 9.5     CBC: Recent Labs  Lab 09/25/20 1304 09/25/20 1815 09/26/20 0316  WBC 3.9* 4.2 3.9*  NEUTROABS  --  2.9  --   HGB 14.3 12.5 12.7  HCT 42.9 39.5 39.9  MCV 91.7 93.4 93.7  PLT 45* 44* 46*      Lab Results  Component Value Date   HEPBSAG NON REACTIVE 05/07/2020      Microbiology:  Recent Results (from the past 240 hour(s))  Resp Panel by RT-PCR (Flu  A&B, Covid) Nasopharyngeal Swab     Status: None   Collection Time: 09/25/20  6:53 PM   Specimen: Nasopharyngeal Swab; Nasopharyngeal(NP) swabs in vial transport medium  Result Value Ref Range Status   SARS Coronavirus 2 by RT PCR NEGATIVE NEGATIVE Final    Comment: (NOTE) SARS-CoV-2 target nucleic acids are NOT DETECTED.  The SARS-CoV-2 RNA is generally detectable in upper respiratory specimens during the acute phase of infection. The lowest concentration of SARS-CoV-2 viral copies this assay can detect is 138 copies/mL. A negative result does not preclude SARS-Cov-2 infection and should not be used as the sole basis for treatment or other patient management decisions. A negative result may occur with  improper specimen collection/handling, submission of specimen other than nasopharyngeal swab, presence of viral mutation(s) within the areas targeted by this assay, and inadequate number of viral copies(<138 copies/mL). A negative result must be combined with clinical observations, patient history, and epidemiological information. The expected result is Negative.  Fact Sheet for Patients:  EntrepreneurPulse.com.au  Fact Sheet for Healthcare Providers:  IncredibleEmployment.be  This test is no t yet approved or cleared by the [place] FDA and  has been authorized for detection and/or diagnosis of SARS-CoV-2 by FDA under  an Emergency Use Authorization (EUA). This EUA will remain  in effect (meaning this test can be used) for the duration of the COVID-19 declaration under Section 564(b)(1) of the Act, 21 U.S.C.section 360bbb-3(b)(1), unless the authorization is terminated  or revoked sooner.       Influenza A by PCR NEGATIVE NEGATIVE Final   Influenza B by PCR NEGATIVE NEGATIVE Final    Comment: (NOTE) The Xpert Xpress SARS-CoV-2/FLU/RSV plus assay is intended as an aid in the diagnosis of influenza from Nasopharyngeal swab specimens  and should not be used as a sole basis for treatment. Nasal washings and aspirates are unacceptable for Xpert Xpress SARS-CoV-2/FLU/RSV testing.  Fact Sheet for Patients: EntrepreneurPulse.com.au  Fact Sheet for Healthcare Providers: IncredibleEmployment.be  This test is not yet approved or cleared by the [place] FDA and has been authorized for detection and/or diagnosis of SARS-CoV-2 by FDA under an Emergency Use Authorization (EUA). This EUA will remain in effect (meaning this test can be used) for the duration of the COVID-19 declaration under Section 564(b)(1) of the Act, 21 U.S.C. section 360bbb-3(b)(1), unless the authorization is terminated or revoked.  Performed at [facility] [address], [address] [place], [place] [address]     Coagulation Studies: No results for input(s): LABPROT, INR in the last 72 hours.  Urinalysis: No results for input(s): COLORURINE, LABSPEC, PHURINE, GLUCOSEU, HGBUR, BILIRUBINUR, KETONESUR, PROTEINUR, UROBILINOGEN, NITRITE, LEUKOCYTESUR in the last 72 hours.  Invalid input(s): APPERANCEUR    Imaging: DG Chest 2 View  Result Date: 09/25/2020 CLINICAL DATA:  Left-sided chest pain with difficulty breathing 1 week. EXAM: CHEST - 2 VIEW COMPARISON:  None. FINDINGS: Right IJ central venous catheter unchanged with tip over the right atrium. Lungs are adequately inflated and otherwise clear. Cardiomediastinal silhouette and remainder of the exam is unchanged. IMPRESSION: No active cardiopulmonary disease. Electronically Signed   By: Marin Olp M.D.   On: 09/25/2020 15:54   CT Head Wo Contrast  Result Date: 09/25/2020 CLINICAL DATA:  Head trauma, headache, hypertension, missed dialysis EXAM: CT HEAD WITHOUT CONTRAST TECHNIQUE: Contiguous axial images were obtained from the base of the skull through the vertex without intravenous contrast. COMPARISON:  08/19/2020 FINDINGS: Brain: No evidence of acute  infarction, hemorrhage, hydrocephalus, extra-axial collection or mass lesion/mass effect. Vascular: No hyperdense vessel or unexpected calcification. Skull: Normal. Negative for fracture or focal lesion. Sinuses/Orbits: No acute finding. Other: None. IMPRESSION: No acute intracranial pathology. Electronically Signed   By: Eddie Candle M.D.   On: 09/25/2020 16:07     Medications:   . methocarbamol (ROBAXIN) IV     . calcitRIOL  0.5 mcg Oral Daily  . carvedilol  37.5 mg Oral BID WC  . Chlorhexidine Gluconate Cloth  6 each Topical Daily  . cinacalcet  30 mg Oral Q supper  . irbesartan  150 mg Oral Daily  . levETIRAcetam  500 mg Oral BID  . multivitamin  1 tablet Oral QHS  . NIFEdipine  90 mg Oral Daily  . sevelamer carbonate  1,600 mg Oral TID with meals  . sodium chloride flush  3 mL Intravenous Q12H   acetaminophen **OR** acetaminophen, methocarbamol (ROBAXIN) IV, ondansetron **OR** ondansetron (ZOFRAN) IV, oxyCODONE, polyethylene glycol, traZODone  Assessment/ Plan:  39 y.o. female with end stage renal disease on hemodialysis secondary to lupus nephritis, hypertension, seizure disorder, staghorn calculus  was admitted on 09/25/2020 for  Active Problems:   Lupus nephritis ([place])   Systemic lupus erythematosus ([place])   PE (pulmonary thromboembolism) ([place])  ESRD on hemodialysis [facility])   Essential hypertension   Secondary hyperparathyroidism ([facility])   Thrombocytopenia ([facility])   Seizure disorder ([facility])   Acute on chronic kidney failure (H[facility])   Posterior reversible encephalopathy syndrome (PRES)  Dyspnea [R06.00] Acute on chronic kidney failure (HCC) [N17.9, N18.9] Hypertensive urgency, malignant [I16.0] Acute intractable headache, unspecified headache type [R51.9]  #. ESRD [facility] MWF [place]. RIJ permcath  We will arrange for hemodialysis today and then regular schedule starting next week  # uncontrolled HTN H/o PRES Resume home medications and assess Volume  status appears to be okay.  Minimal UF during dialysis  #. Anemia of CKD  Lab Results  Component Value Date   HGB 12.7 09/26/2020   Low dose EPO with HD for Hgb <11   #. Secondary hyperparathyroidism of renal origin N 25.81      Component Value Date/Time   PTH 810 (H) 05/08/2020 0721   Lab Results  Component Value Date   PHOS 4.3 05/09/2020   Monitor calcium and phos level during this admission      LOS: [place] 12/25/20219:51 [place], [place]

## 2020-09-26 NOTE — Progress Notes (Signed)
Pt called the front desk at 1415 that her only ride for the day had to get to work at 1500 in [place]. Pt had been told at noon that she would receive dialysis today but no time was specified. I notified Dr. Manuella Ghazi and he consulted with Dr. Candiss Norse and case manager.

## 2020-09-26 NOTE — ED Notes (Signed)
PT states she was just discharged from [facility] with her IV. Pt noted to have 20G to the right arm. This RN notified ED charge nurse who stated to take it out and chart it. IV removed, catheter intact, bleeding controlled.

## 2020-09-26 NOTE — Progress Notes (Signed)
1        [facility] [place] NAME: Patricia Maldonado    MR#:  [id number]  DATE OF BIRTH:  16-Jan-1981  SUBJECTIVE:  CHIEF COMPLAINT:   Chief Complaint  Patient presents with   Headache   Hypertension  Sitting in the bed eating her breakfast.  Denies any complaint at this time.  Reports that she had not been taking her blood pressure medicine for about a month as she was not able [facility] REVIEW OF SYSTEMS:  Review of Systems  Constitutional: Negative for diaphoresis, fever, malaise/fatigue and weight loss.  HENT: Negative for ear discharge, ear pain, hearing loss, nosebleeds, sore throat and tinnitus.   Eyes: Negative for blurred vision and pain.  Respiratory: Negative for cough, hemoptysis, shortness of breath and wheezing.   Cardiovascular: Negative for chest pain, palpitations, orthopnea and leg swelling.  Gastrointestinal: Negative for abdominal pain, blood in stool, constipation, diarrhea, heartburn, nausea and vomiting.  Genitourinary: Negative for dysuria, frequency and urgency.  Musculoskeletal: Negative for back pain and myalgias.  Skin: Negative for itching and rash.  Neurological: Negative for dizziness, tingling, tremors, focal weakness, seizures, weakness and headaches.  Psychiatric/Behavioral: Negative for depression. The patient is not nervous/anxious.    DRUG ALLERGIES:   Allergies  Allergen Reactions   Penicillins Swelling    Yeast infection Has patient had a PCN reaction causing immediate rash, facial/tongue/throat swelling, SOB or lightheadedness with hypotension:No Has patient had a PCN reaction causing severe rash involving mucus membranes or skin necrosis: Yes Has patient had a PCN reaction that required hospitalization No Has patient had a PCN reaction occurring within the last 10 years: No If all of the above answers are "NO", then may proceed with Cephalosporin use.  Yeast infection Has patient  had a PCN reaction causing immediate rash, facial/tongue/throat swelling, SOB or lightheadedness with hypotension:No Has patient had a PCN reaction causing severe rash involving mucus membranes or skin necrosis: Yes Has patient had a PCN reaction that required hospitalization No Has patient had a PCN reaction occurring within the last 10 years: No If all of the above answers are "NO", then may proceed with Cephalosporin use.   VITALS:  Blood pressure (!) 150/104, pulse 88, temperature 98.3 F (36.8 C), resp. rate 15, height 5\' 2"  (1.575 m), weight 46 kg, last menstrual period 09/02/2020, SpO2 97 %. PHYSICAL EXAMINATION:  Physical Exam HENT:     Head: Normocephalic and atraumatic.  Eyes:     Extraocular Movements: EOM normal.     Conjunctiva/sclera: Conjunctivae normal.     Pupils: Pupils are equal, round, and reactive to light.  Neck:     Thyroid: No thyromegaly.     Trachea: No tracheal deviation.  Cardiovascular:     Rate and Rhythm: Normal rate and regular rhythm.     Heart sounds: Normal heart sounds.  Pulmonary:     Effort: Pulmonary effort is normal. No respiratory distress.     Breath sounds: Normal breath sounds. No wheezing.  Chest:     Chest wall: No tenderness.  Abdominal:     General: Bowel sounds are normal. There is no distension.     Palpations: Abdomen is soft.     Tenderness: There is no abdominal tenderness.  Musculoskeletal:        General: Normal range of motion.     Cervical back: Normal range of motion and neck supple.  Skin:    General:  Skin is warm and dry.     Findings: No rash.  Neurological:     Mental Status: She is alert and oriented to person, place, and time.     Cranial Nerves: No cranial nerve deficit.    LABORATORY PANEL:  Female CBC Recent Labs  Lab 09/26/20 0316  WBC 3.9*  HGB 12.7  HCT 39.9  PLT 46*   ------------------------------------------------------------------------------------------------------------------ Chemistries   Recent Labs  Lab 09/26/20 0316  NA 138  K 3.7  CL 101  CO2 22  GLUCOSE 93  BUN 54*  CREATININE 13.99*  CALCIUM 9.5   RADIOLOGY:  DG Chest 2 View  Result Date: 09/25/2020 CLINICAL DATA:  Left-sided chest pain with difficulty breathing 1 week. EXAM: CHEST - 2 VIEW COMPARISON:  None. FINDINGS: Right IJ central venous catheter unchanged with tip over the right atrium. Lungs are adequately inflated and otherwise clear. Cardiomediastinal silhouette and remainder of the exam is unchanged. IMPRESSION: No active cardiopulmonary disease. Electronically Signed   By: Marin Olp M.D.   On: 09/25/2020 15:54   CT Head Wo Contrast  Result Date: 09/25/2020 CLINICAL DATA:  Head trauma, headache, hypertension, missed dialysis EXAM: CT HEAD WITHOUT CONTRAST TECHNIQUE: Contiguous axial images were obtained from the base of the skull through the vertex without intravenous contrast. COMPARISON:  08/19/2020 FINDINGS: Brain: No evidence of acute infarction, hemorrhage, hydrocephalus, extra-axial collection or mass lesion/mass effect. Vascular: No hyperdense vessel or unexpected calcification. Skull: Normal. Negative for fracture or focal lesion. Sinuses/Orbits: No acute finding. Other: None. IMPRESSION: No acute intracranial pathology. Electronically Signed   By: Eddie Candle M.D.   On: 09/25/2020 16:07   ASSESSMENT AND PLAN:  39 year old female with a known history of lupus nephritis, ESRD on dialysis, seizure disorder, thrombocytopenia, PE, PR ES is admitted for uncontrolled blood pressure, headache, nausea and vomiting  #Headache -resolved #Nausea and vomiting resolved #End-stage renal disease on hemodialysis #Lupus nephritis #Cannabis hyperemesis syndrome Most of her symptoms are resolved at this point.  Her urine drug screen is positive for cannabinoids.  She is counseled and acknowledges use of cannabis. Nephrology working on to get her dialyzed while in the hospital  Uncontrolled  hypertension Likely due to medication noncompliance I have sent electronic prescription of all her blood pressure medicine [facility] per patient request as she was not able to pick it up from [facility]  Thrombocytopenia Lupus Chronic and stable  Medication noncompliance and drugs of abuse Patient was counseled.  She is agreeable to take her medication  Body mass index is 18.55 kg/m.     Status is: Inpatient  Remains inpatient appropriate because:Hemodynamically unstable   Dispo: The patient is from: Home              Anticipated d/c is to: Home              Anticipated d/c date is: 1 day/later tonight if right can be arranged              Patient currently is not medically stable to d/c.  Need to get her blood pressure under better control and likely needs dialysis while here    DVT prophylaxis:       SCDs Start: 09/25/20 2147     Family Communication: ("discussed with patient")   All the records are reviewed and case discussed with Care Management/Social Worker. Management plans discussed with the patient, nephrology, nursing and they are in agreement.  CODE STATUS: Full Code  TOTAL TIME TAKING CARE OF THIS PATIENT: 35 minutes.   More than 50% of the time was spent in counseling/coordination of care: YES  POSSIBLE D/C IN 1 DAYS, DEPENDING ON CLINICAL CONDITION.  And her blood pressure control along with dialysis needs   Max Sane M.D on 09/26/2020 at 2:29 PM  [facility]   CC: Primary care physician; Letta Median, MD  Note: This dictation was prepared with Dragon dictation along with smaller phrase technology. Any transcriptional errors that result from this process are unintentional.

## 2020-09-26 NOTE — Progress Notes (Signed)
Patient returned from Dialysis and stated she had a ride and was leaving right now/ride outside waiting for her. Dr. Manuella Ghazi put in DC orders again after cancelling them and completed discharge in a rush while Dialysis was calling for me to give another patient Haldol, another patient was getting out of bed without assistance/bed alarm going off/confused, and transport was trying to talk with me regarding picking up patient for Dialysis while patient discharging was asking questions and needed me to walk her down to ED.

## 2020-09-26 NOTE — Discharge Instructions (Signed)
Cannabinoid Hyperemesis Syndrome Cannabinoid hyperemesis syndrome (CHS) is a condition that causes repeated nausea, vomiting, and abdominal pain after long-term (chronic) use of marijuana (cannabis). People with CHS typically use marijuana 3-5 times a day for many years before they have symptoms, although it is possible to develop CHS with as little as 1 use per day. Symptoms of CHS may be mild at first but can get worse and more frequent. In some cases, CHS may cause vomiting many times a day, which can lead to weight loss and dehydration. CHS may go away and come back many times (recur). People may not have symptoms or may otherwise be healthy in between [facility] attacks. What are the causes? The exact cause of this condition is not known. Long-term use of marijuana may over-stimulate certain proteins in the brain that react with chemicals in marijuana (cannabinoid receptors). This over-stimulation may cause CHS. What are the signs or symptoms? Symptoms of this condition are often mild during the first few attacks, but they can get worse over time. Symptoms may include:  Frequent nausea, especially early in the morning.  Vomiting.  Abdominal pain. Taking several hot showers throughout the day can also be a sign of this condition. People with CHS may do this because it relieves symptoms. How is this diagnosed? This condition may be diagnosed based on:  Your symptoms and medical history, including any drug use.  A physical exam. You may have tests done to rule out other problems. These tests may include:  Blood tests.  Urine tests.  Imaging tests, such as an X-ray or CT scan. How is this treated? Treatment for this condition involves stopping marijuana use. Your health care provider may recommend:  A drug rehabilitation program, if you have trouble stopping marijuana use.  Medicines for nausea.  Hot showers to help relieve symptoms. Certain creams that contain a substance called  capsaicin may improve symptoms when applied to the abdomen. Ask your health care provider before starting any medicines or other treatments. Severe nausea and vomiting may require you to stay at the hospital. You may need IV fluids to prevent or treat dehydration. You may also need certain medicines that must be given at the hospital. Follow these instructions at home: During an attack   Stay in bed and rest in a dark, quiet room.  Take anti-nausea medicine as told by your health care provider.  Try taking hot showers to relieve your symptoms. After an attack  Drink small amounts of clear fluids slowly. Gradually add more.  Once you are able to eat without vomiting, eat soft foods in small amounts every 3-4 hours. General instructions   Do not use any products that contain marijuana.If you need help quitting, ask your health care provider for resources and treatment options.  Drink enough fluid to keep your urine pale yellow. Avoid drinking fluids that have a lot of sugar or caffeine, such as coffee and soda.  Take and apply over-the-counter and prescription medicines only as told by your health care provider. Ask your health care provider before starting any new medicines or treatments.  Keep all follow-up visits as told by your health care provider. This is important. Contact a health care provider if:  Your symptoms get worse.  You cannot drink fluids without vomiting.  You have pain and trouble swallowing after an attack. Get help right away if:  You cannot stop vomiting.  You have blood in your vomit or your vomit looks like coffee grounds.  You have  severe abdominal pain.  You have stools that are bloody or black, or stools that look like tar.  You have symptoms of dehydration, such as: ? Sunken eyes. ? Inability to make tears. ? Cracked lips. ? Dry mouth. ? Decreased urine production. ? Weakness. ? Sleepiness. ? Fainting. Summary  Cannabinoid hyperemesis  syndrome (CHS) is a condition that causes repeated nausea, vomiting, and abdominal pain after long-term use of marijuana.  People with CHS typically use marijuana 3-5 times a day for many years before they have symptoms, although it is possible to develop CHS with as little as 1 use per day.  Treatment for this condition involves stopping marijuana use. Hot showers and capsaicin creams may also help relieve symptoms. Ask your health care provider before starting any medicines or other treatments.  Your health care provider may prescribe medicines to help with nausea.  Get help right away if you have signs of dehydration, such as dry mouth, decreased urine production, or weakness. This information is not intended to replace advice given to you by your health care provider. Make sure you discuss any questions you have with your health care provider. Document Revised: 01/26/2018 Document Reviewed: 12/28/2016 Elsevier Patient Education  [place].

## 2020-09-26 NOTE — Progress Notes (Signed)
Due to ride issue, I had cancelled D/C especially her BP was still running high post HD despite receiving her BP meds.  Patient now demanding D/C as she doesn't have any one to take care of her kids and arranged for ride.  Will D/C her.

## 2020-09-27 NOTE — Discharge Summary (Signed)
[facility] [place] NAME: Patricia Maldonado    MR#:  [id number]  DATE OF BIRTH:  [birth year]  DATE OF ADMISSION:  09/25/2020   ADMITTING PHYSICIAN: Clarnce Flock, MD  DATE OF DISCHARGE: 09/26/2020  7:45 PM  PRIMARY CARE PHYSICIAN: Letta Median, MD   ADMISSION DIAGNOSIS:  Dyspnea [R06.00] Acute on chronic kidney failure ([place]) [N17.9, N18.9] Hypertensive urgency, malignant [I16.0] Acute intractable headache, unspecified headache type [R51.9] DISCHARGE DIAGNOSIS:  Active Problems:   Lupus nephritis ([place])   Systemic lupus erythematosus ([place])   PE (pulmonary thromboembolism) ([place])   ESRD on hemodialysis ([place])   Essential hypertension   Secondary hyperparathyroidism (HCC)   Thrombocytopenia (HCC)   Seizure disorder ([place])   Acute on chronic kidney failure ([place])   Posterior reversible encephalopathy syndrome ([facility])   Hypertensive urgency, malignant   Dyspnea   Cannabis 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 Acute renal failure (ARF) ([place])   . Anemia   . Arthritis   . History of kidney stones   . Hypertension    OFF BP MEDS X 3 WEEKS DUE TO BP CONTROL  . Lupus nephritis ([place]) 12/2016   DX DURING HOSPITAL ADMISSION  . Rash   . Staghorn calculus   . 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[place]) 10/2016   DX DURING HOSPITAL ADMISSION   HOSPITAL COURSE:  39 year old female with a known history of lupus nephritis, ESRD on dialysis, seizure disorder, thrombocytopenia, PE, PR ES is admitted for uncontrolled blood pressure, headache, nausea and vomiting  #Headache -resolved #Nausea and vomiting resolved #End-stage renal disease on hemodialysis #Lupus nephritis #Cannabis hyperemesis syndrome All of her symptoms are resolved at this point.  Her urine drug screen is positive for cannabinoids.  She is counseled and acknowledges use of  cannabis.  Uncontrolled hypertension Likely due to medication noncompliance I have sent electronic prescription of all [facility] blood pressure [facility] per patient request as she was not able to pick it [facility]  Patient demanded discharge as she does not have anyone to take care of her kids at home.  She is at high risk for readmission.   DISCHARGE CONDITIONS:  Stable CONSULTS OBTAINED:   DRUG ALLERGIES:   Allergies  Allergen Reactions  . Penicillins Swelling    Yeast infection Has patient had a PCN reaction causing immediate rash, facial/tongue/throat swelling, SOB or lightheadedness with hypotension:No Has patient had a PCN reaction causing severe rash involving mucus membranes or skin necrosis: Yes Has patient had a PCN reaction that required hospitalization No Has patient had a PCN reaction occurring within the last 10 years: No If all of the above answers are "NO", then may proceed with Cephalosporin use.  Yeast infection Has patient had a PCN reaction causing immediate rash, facial/tongue/throat swelling, SOB or lightheadedness with hypotension:No Has patient had a PCN reaction causing severe rash involving mucus membranes or skin necrosis: Yes Has patient had a PCN reaction that required hospitalization No Has patient had a PCN reaction occurring within the last 10 years: No If all of the above answers are "NO", then may proceed with Cephalosporin use.   DISCHARGE MEDICATIONS:   Allergies as of 09/26/2020      Reactions   Penicillins Swelling   Yeast infection Has patient had a PCN reaction  causing immediate rash, facial/tongue/throat swelling, SOB or lightheadedness with hypotension:No Has patient had a PCN reaction causing severe rash involving mucus membranes or skin necrosis: Yes Has patient had a PCN reaction that required hospitalization No Has patient had a PCN reaction occurring within the last 10 years: No If all  of the above answers are "NO", then may proceed with Cephalosporin use. Yeast infection Has patient had a PCN reaction causing immediate rash, facial/tongue/throat swelling, SOB or lightheadedness with hypotension:No Has patient had a PCN reaction causing severe rash involving mucus membranes or skin necrosis: Yes Has patient had a PCN reaction that required hospitalization No Has patient had a PCN reaction occurring within the last 10 years: No If all of the above answers are "NO", then may proceed with Cephalosporin use.      Medication List    STOP taking these medications   gentamicin cream 0.1 % Commonly known as: GARAMYCIN     TAKE these medications   calcitRIOL 0.5 MCG capsule Commonly known as: ROCALTROL 2 Capsule(s) By Mouth Daily   carvedilol 12.5 MG tablet Commonly known as: COREG Take 3 tablets (37.5 mg total) by mouth 2 (two) times daily with a meal. tablet Take 3 tablets (37.5 mg total) by mouth two (2) times a day What changed:  how much to take how to take this when to take this   cinacalcet 30 MG tablet Commonly known as: SENSIPAR Take 30 mg by mouth daily.   citalopram 10 MG tablet Commonly known as: CELEXA Take 10 mg by mouth daily.   Dialyvite/Zinc Tabs Take 1 tablet by mouth at bedtime.   hydroxychloroquine 200 MG tablet Commonly known as: PLAQUENIL   hydroxyurea 200 MG capsule Commonly known as: DROXIA Take 200 mg by mouth daily.   levETIRAcetam 500 MG tablet Commonly known as: KEPPRA TAKE 1 TABLET BY MOUTH TWICE DAILY WITH ADDITIONAL 1 TABLET AFTER DIALYSIS   mycophenolate 500 MG tablet Commonly known as: CellCept Take 2 tablets (1,000 mg total) by mouth 2 (two) times daily.   NIFEdipine 90 MG 24 hr tablet Commonly known as: PROCARDIA XL/NIFEDICAL-XL Take 1 tablet (90 mg total) by mouth daily.   ondansetron 4 MG disintegrating tablet Commonly known as: ZOFRAN-ODT Take 4 mg by mouth every 6 (six) hours as needed for nausea/vomiting.    prochlorperazine 10 MG tablet Commonly known as: COMPAZINE Take 1 tablet (10 mg total) by mouth every 6 (six) hours as needed for up to 15 doses (30 Min prior to dialysis.).   sevelamer carbonate 800 MG tablet Commonly known as: RENVELA Take 1,600 mg by mouth 3 (three) times daily.   valsartan 160 MG tablet Commonly known as: DIOVAN Take 1 tablet (160 mg total) by mouth daily.      DISCHARGE INSTRUCTIONS:   DIET:  Renal diet DISCHARGE CONDITION:  Stable ACTIVITY:  Activity as tolerated OXYGEN:  Home Oxygen: No.  Oxygen Delivery: room air DISCHARGE LOCATION:  home   If you experience worsening of your admission symptoms, develop shortness of breath, life threatening emergency, suicidal or homicidal thoughts you must seek medical attention immediately by calling 911 or calling your MD immediately  if symptoms less severe.  You Must read complete instructions/literature along with all the possible adverse reactions/side effects for all the Medicines you take and that have been prescribed to you. Take any new Medicines after you have completely understood and accpet all the possible adverse reactions/side effects.   Please note  You were cared for by a  hospitalist during your hospital stay. If you have any questions about your discharge medications or the care you received while you were in the hospital after you are discharged, you can call the unit and asked to speak with the hospitalist on call if the hospitalist that took care of you is not available. Once you are discharged, your primary care physician will handle any further medical issues. Please note that NO REFILLS for any discharge medications will be authorized once you are discharged, as it is imperative that you return to your primary care physician (or establish a relationship with a primary care physician if you do not have one) for your aftercare needs so that they can reassess your need for medications and monitor  your lab values.    On the day of Discharge:  VITAL SIGNS:  Blood pressure (!) 142/109, pulse (!) 104, temperature 98.1 F (36.7 C), temperature source Oral, resp. rate (!) 23, height 5\' 2"  (1.575 m), weight 46 kg, last menstrual period 09/02/2020, SpO2 100 %. PHYSICAL EXAMINATION:  GENERAL:  39 y.o.-year-old patient lying in the bed with no acute distress.  EYES: Pupils equal, round, reactive to light and accommodation. No scleral icterus. Extraocular muscles intact.  HEENT: Head atraumatic, normocephalic. Oropharynx and nasopharynx clear.  NECK:  Supple, no jugular venous distention. No thyroid enlargement, no tenderness.  LUNGS: Normal breath sounds bilaterally, no wheezing, rales,rhonchi or crepitation. No use of accessory muscles of respiration.  CARDIOVASCULAR: S1, S2 normal. No murmurs, rubs, or gallops.  ABDOMEN: Soft, non-tender, non-distended. Bowel sounds present. No organomegaly or mass.  EXTREMITIES: No pedal edema, cyanosis, or clubbing.  NEUROLOGIC: Cranial nerves II through XII are intact. Muscle strength 5/5 in all extremities. Sensation intact. Gait not checked.  PSYCHIATRIC: The patient is alert and oriented x 3.  SKIN: No obvious rash, lesion, or ulcer.  DATA REVIEW:   CBC Recent Labs  Lab 09/26/20 0316  WBC 3.9*  HGB 12.7  HCT 39.9  PLT 46*    Chemistries  Recent Labs  Lab 09/26/20 0316  NA 138  K 3.7  CL 101  CO2 22  GLUCOSE 93  BUN 54*  CREATININE 13.99*  CALCIUM 9.5     Outpatient follow-up  Follow-up Information    Letta Median, MD. Schedule an appointment as soon as possible for a visit in 1 week(s).   Specialty: Family Medicine Contact information: [place] [address] [phone]                Management plans discussed with the patient, family and they are in agreement.  CODE STATUS: Prior   TOTAL TIME TAKING CARE OF THIS PATIENT: 45 minutes.    Max Sane M.D on 09/27/2020 at  3:44 PM  [facility]   CC: Primary care physician; Letta Median, MD   Note: This dictation was prepared with Dragon dictation along with smaller phrase technology. Any transcriptional errors that result from this process are unintentional.

## 2020-09-28 ENCOUNTER — Encounter: Payer: Self-pay | Admitting: *Deleted

## 2020-09-28 ENCOUNTER — Emergency Department
Admission: EM | Admit: 2020-09-28 | Discharge: 2020-09-28 | Disposition: A | Discharge status: Home / Self Care | Payer: Medicaid Other | Attending: Emergency Medicine | Admitting: Emergency Medicine

## 2020-09-28 ENCOUNTER — Other Ambulatory Visit: Payer: Self-pay

## 2020-09-28 ENCOUNTER — Emergency Department: Payer: Medicaid Other

## 2020-09-28 DIAGNOSIS — I120 Hypertensive chronic kidney disease with stage 5 chronic kidney disease or end stage renal disease: Secondary | ICD-10-CM | POA: Insufficient documentation

## 2020-09-28 DIAGNOSIS — M791 Myalgia, unspecified site: Secondary | ICD-10-CM | POA: Insufficient documentation

## 2020-09-28 DIAGNOSIS — I10 Essential (primary) hypertension: Secondary | ICD-10-CM

## 2020-09-28 DIAGNOSIS — F17210 Nicotine dependence, cigarettes, uncomplicated: Secondary | ICD-10-CM | POA: Diagnosis not present

## 2020-09-28 DIAGNOSIS — R1110 Vomiting, unspecified: Secondary | ICD-10-CM | POA: Diagnosis present

## 2020-09-28 DIAGNOSIS — R6883 Chills (without fever): Secondary | ICD-10-CM | POA: Insufficient documentation

## 2020-09-28 DIAGNOSIS — Z992 Dependence on renal dialysis: Secondary | ICD-10-CM | POA: Diagnosis not present

## 2020-09-28 DIAGNOSIS — N186 End stage renal disease: Secondary | ICD-10-CM | POA: Diagnosis not present

## 2020-09-28 DIAGNOSIS — Z79899 Other long term (current) drug therapy: Secondary | ICD-10-CM | POA: Diagnosis not present

## 2020-09-28 DIAGNOSIS — Z20822 Contact with and (suspected) exposure to covid-19: Secondary | ICD-10-CM | POA: Insufficient documentation

## 2020-09-28 LAB — RESP PANEL BY RT-PCR (FLU A&B, COVID) ARPGX2
Influenza A by PCR: NEGATIVE
Influenza B by PCR: NEGATIVE
SARS Coronavirus 2 by RT PCR: NEGATIVE

## 2020-09-28 LAB — BASIC METABOLIC PANEL
Anion gap: 13 (ref 5–15)
BUN: 20 mg/dL (ref 6–20)
CO2: 27 mmol/L (ref 22–32)
Calcium: 9.1 mg/dL (ref 8.9–10.3)
Chloride: 98 mmol/L (ref 98–111)
Creatinine, Ser: 5.57 mg/dL — ABNORMAL HIGH (ref 0.44–1.00)
GFR, Estimated: 9 mL/min — ABNORMAL LOW (ref >60)
Glucose, Bld: 94 mg/dL (ref 70–99)
Potassium: 3.3 mmol/L — ABNORMAL LOW (ref 3.5–5.1)
Sodium: 138 mmol/L (ref 135–145)

## 2020-09-28 MED ORDER — SODIUM CHLORIDE 0.9 % IV BOLUS: 1000.0000 mL | Freq: Once | INTRAVENOUS | Status: DC

## 2020-09-28 MED ORDER — CLONIDINE HCL 0.1 MG PO TABS
0.1000 mg | ORAL_TABLET | Freq: Once | ORAL | Status: AC
  Administered 2020-09-28: 17:00:00 0.1 mg via ORAL
  Filled 2020-09-28: qty 1

## 2020-09-28 MED ORDER — NIFEDIPINE ER OSMOTIC RELEASE 30 MG PO TB24
90.0000 mg | ORAL_TABLET | Freq: Once | ORAL | Status: AC
  Administered 2020-09-28: 16:00:00 90 mg via ORAL
  Filled 2020-09-28 (×2): qty 3

## 2020-09-28 MED ORDER — CARVEDILOL 6.25 MG PO TABS
37.5000 mg | ORAL_TABLET | Freq: Once | ORAL | Status: AC
  Administered 2020-09-28: 16:00:00 37.5 mg via ORAL
  Filled 2020-09-28: qty 2

## 2020-09-28 MED ORDER — SODIUM CHLORIDE 0.9 % IV BOLUS
500.0000 mL | Freq: Once | INTRAVENOUS | Status: AC
  Administered 2020-09-28: 16:00:00 500 mL via INTRAVENOUS

## 2020-09-28 MED ORDER — ACETAMINOPHEN 500 MG PO TABS
1000.0000 mg | ORAL_TABLET | Freq: Once | ORAL | Status: AC
  Administered 2020-09-28: 19:00:00 1000 mg via ORAL
  Filled 2020-09-28: qty 2

## 2020-09-28 MED ORDER — KETOROLAC TROMETHAMINE 30 MG/ML IJ SOLN: 15.0000 mg | INTRAMUSCULAR | Status: DC

## 2020-09-28 MED ORDER — IRBESARTAN 150 MG PO TABS
150.0000 mg | ORAL_TABLET | Freq: Once | ORAL | Status: DC
  Filled 2020-09-28 (×2): qty 1

## 2020-09-28 MED ORDER — HALOPERIDOL LACTATE 5 MG/ML IJ SOLN
2.5000 mg | Freq: Once | INTRAMUSCULAR | Status: AC
  Administered 2020-09-28: 16:00:00 2.5 mg via INTRAVENOUS
  Filled 2020-09-28: qty 1

## 2020-09-28 MED ORDER — DIPHENHYDRAMINE HCL 50 MG/ML IJ SOLN
25.0000 mg | Freq: Once | INTRAMUSCULAR | Status: AC
  Administered 2020-09-28: 16:00:00 25 mg via INTRAVENOUS
  Filled 2020-09-28: qty 1

## 2020-09-28 NOTE — ED Triage Notes (Signed)
Per EMS report, patient had about an hour of dialysis and began vomiting. Patient vomited upon arrival to ED. Patient had same complaint 12/24 and was admitted overnight.

## 2020-09-28 NOTE — Discharge Instructions (Addendum)
We gave you the morning doses of your blood pressure medicines. Please continue all of your blood pressure medicines at home, and take your evening doses as well  Return to the ED with any worsening symptoms.

## 2020-09-28 NOTE — ED Provider Notes (Signed)
[facility] Emergency Department Provider Note  ____________________________________________  Time seen: Approximately 3:59 PM  I have reviewed the triage vital signs and the nursing notes.   HISTORY  Chief Complaint Emesis    HPI Patricia Maldonado is a 39 y.o. female with a history of end-stage renal disease on hemodialysis, lupus nephritis, cannabis hyperemesis syndrome  who comes ED complaining of vomiting during her dialysis session today.  Also has severe hypertension, reports that she has not yet picked up her blood pressure medicine that was recently prescribed for her.  Also complains of chills and body aches and fatigue.  Denies chest pain or shortness of breath.  Has mild gradual onset bilateral frontal headache, no occipital headache dizziness or change in balance and coordination.  No vision changes.     Past Medical History:  Diagnosis Date  . Acute renal failure (ARF) ([place])   . Anemia   . Arthritis   . History of kidney stones   . Hypertension    OFF BP MEDS X 3 WEEKS DUE TO BP CONTROL  . Lupus nephritis ([place]) 12/2016   DX DURING HOSPITAL ADMISSION  . Rash   . Staghorn calculus   . 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[place]) 10/2016   [place] ADMISSION     Patient Active Problem List   Diagnosis Date Noted  . Hypertensive urgency, malignant   . Dyspnea   . Cannabis hyperemesis syndrome concurrent with and due to cannabis abuse ([place])   . Acute intractable headache   . Acute on chronic kidney failure ([place]) 09/25/2020  . Posterior reversible encephalopathy syndrome (PRES) 09/25/2020  . Blurred vision, bilateral 05/08/2020  . Seizures ([place]) 05/06/2020  . Cerebral edema ([place]) 12/23/2019  . Seizure disorder ([place]) 12/20/2019  . Acute repetitive seizure ([place]) 12/19/2019  . History of Closed sacral fracture ([place]) 12/19/2019  . Thrombocytopenia ([place]) 12/19/2019  . Anemia, unspecified 04/28/2019  . ESRD on hemodialysis ([place]) 02/21/2019  . Essential  hypertension 02/21/2019  . CKD (chronic kidney disease), stage V ([place]) 09/12/2018  . Metabolic acidosis 46/56/8127  . Secondary hyperparathyroidism ([place]) 09/12/2018  . Acute on chronic renal failure ([place]) 04/26/2018  . Acute kidney injury ([place]) 07/27/2017  . Alopecia of scalp 03/08/2017  . Thyroid nodule 03/08/2017  . PE (pulmonary thromboembolism) ([place]) 03/01/2017  . Kidney stone on left side 01/23/2017  . Systemic lupus erythematosus ([place]) 12/08/2016  . Encounter for long-term (current) use of high-risk medication 12/08/2016  . 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 Lupus nephritis ([place]) 11/07/2016  . Acute renal failure (ARF) ([place]) 10/04/2016  . Nephrolithiasis   . Staghorn calculus   . Acute renal failure with tubular necrosis ([place]) 08/30/2016     Past Surgical History:  Procedure Laterality Date  . CYSTOSCOPY/URETEROSCOPY/HOLMIUM LASER/STENT PLACEMENT Right 02/21/2017   Procedure: CYSTOSCOPY/URETEROSCOPY/HOLMIUM LASER/STENT EXCHANGE;  Surgeon: [name], MD;  Location: [facility] ORS;  Service: Urology;  Laterality: Right;  . DIALYSIS/PERMA CATHETER INSERTION N/A 11/30/2018   Procedure: DIALYSIS/PERMA CATHETER INSERTION;  Surgeon: Algernon Huxley, MD;  Location: [place] [facility];  Service: Cardiovascular;  Laterality: N/A;  . DIALYSIS/PERMA CATHETER INSERTION N/A 07/07/2020   Procedure: DIALYSIS/PERMA CATHETER INSERTION;  Surgeon: Katha Cabal, MD;  Location: [place] [facility];  Service: Cardiovascular;  Laterality: N/A;  . IR NEPHROSTOMY PLACEMENT RIGHT  01/23/2017  . NEPHROLITHOTOMY Right 01/24/2017   Procedure: NEPHROLITHOTOMY PERCUTANEOUS;  Surgeon: [name], MD;  Location: [facility] ORS;  Service: Urology;  Laterality: Right;  . PERITONEAL CATHETER INSERTION    .  TUBAL LIGATION       Prior to Admission medications   Medication Sig Start Date End Date Taking? Authorizing Provider  B Complex-C-Zn-Folic Acid (DIALYVITE/ZINC) TABS Take 1 tablet by mouth at bedtime. 05/27/20    [provider]  calcitRIOL (ROCALTROL) 0.5 MCG capsule 2 Capsule(s) By Mouth Daily    [provider]  carvedilol (COREG) 12.5 MG tablet Take 3 tablets (37.5 mg total) by mouth 2 (two) times daily with a meal. tablet Take 3 tablets (37.5 mg total) by mouth two (2) times a day 09/26/20 10/26/20  Max Sane, MD  cinacalcet (SENSIPAR) 30 MG tablet Take 30 mg by mouth daily.    [provider]  citalopram (CELEXA) 10 MG tablet Take 10 mg by mouth daily.    [provider]  hydroxychloroquine (PLAQUENIL) 200 MG tablet  04/25/20   [provider]  hydroxyurea (DROXIA) 200 MG capsule Take 200 mg by mouth daily.    [provider]  levETIRAcetam (KEPPRA) 500 MG tablet TAKE 1 TABLET BY MOUTH TWICE DAILY WITH ADDITIONAL 1 TABLET AFTER DIALYSIS    [provider]  mycophenolate (CELLCEPT) 500 MG tablet Take 2 tablets (1,000 mg total) by mouth 2 (two) times daily. 04/30/18   Hillary Bow, MD  NIFEdipine (PROCARDIA XL/NIFEDICAL-XL) 90 MG 24 hr tablet Take 1 tablet (90 mg total) by mouth daily. 09/26/20 10/26/20  Max Sane, MD  ondansetron (ZOFRAN-ODT) 4 MG disintegrating tablet Take 4 mg by mouth every 6 (six) hours as needed for nausea/vomiting. 09/09/20   [provider]  prochlorperazine (COMPAZINE) 10 MG tablet Take 1 tablet (10 mg total) by mouth every 6 (six) hours as needed for up to 15 doses (30 Min prior to dialysis.). 05/10/20   Para Skeans, MD  sevelamer carbonate (RENVELA) 800 MG tablet Take 1,600 mg by mouth 3 (three) times daily. 07/24/20   [provider]  valsartan (DIOVAN) 160 MG tablet Take 1 tablet (160 mg total) by mouth daily. 09/26/20 10/26/20  Max Sane, MD     Allergies Penicillins   Family History  Problem Relation Age of Onset  . Thyroid disease Mother   . Thyroid disease Father   . Diabetes Father     Social History Social History   Tobacco Use  . Smoking status: Current Every Day Smoker     Packs/day: 0.25    Years: 15.00    Pack years: 3.75    Types: Cigarettes  . Smokeless tobacco: Never Used  Vaping Use  . Vaping Use: Never used  Substance Use Topics  . Alcohol use: No  . Drug use: Yes    Types: Marijuana    Comment: OCC    Review of Systems  Constitutional:   No fever positive chills.  ENT:   No sore throat. No rhinorrhea. Cardiovascular:   No chest pain or syncope. Respiratory:   No dyspnea or cough. Gastrointestinal:   Negative for abdominal pain, positive vomiting Musculoskeletal:   Negative for focal pain or swelling All other systems reviewed and are negative except as documented above in ROS and HPI.  ____________________________________________   PHYSICAL EXAM:  VITAL SIGNS: ED Triage Vitals  Enc Vitals Group     BP 09/28/20 1419 (!) 207/121     Pulse Rate 09/28/20 1419 65     Resp 09/28/20 1419 20     Temp 09/28/20 1419 98 F (36.7 C)     Temp Source 09/28/20 1419 Oral     SpO2 09/28/20 1419  100 %     Weight 09/28/20 1358 101 lb (45.8 kg)     Height 09/28/20 1358 5\' 2"  (1.575 m)     Head Circumference --      Peak Flow --      Pain Score 09/28/20 1358 9     Pain Loc --      Pain Edu? --      Excl. in [place]? --     Vital signs reviewed, nursing assessments reviewed.   Constitutional:   Alert and oriented. Non-toxic appearance. Eyes:   Conjunctivae are normal. EOMI. PERRL. ENT      Head:   Normocephalic and atraumatic.      Nose:   Wearing a mask.      Mouth/Throat:   Wearing a mask.      Neck:   No meningismus. Full ROM. Hematological/Lymphatic/Immunilogical:   No cervical lymphadenopathy. Cardiovascular:   RRR. Symmetric bilateral radial and DP pulses.  No murmurs. Cap refill less than 2 seconds. Respiratory:   Normal respiratory effort without tachypnea/retractions. Breath sounds are clear and equal bilaterally. No wheezes/rales/rhonchi. Gastrointestinal:   Soft and nontender. Non distended. There is no CVA tenderness.  No  rebound, rigidity, or guarding.  Musculoskeletal:   Normal range of motion in all extremities. No joint effusions.  No lower extremity tenderness.  No edema. Neurologic:   Normal speech and language.  Motor grossly intact. No acute focal neurologic deficits are appreciated.  Skin:    Skin is warm, dry and intact. No rash noted.  No petechiae, purpura, or bullae.  ____________________________________________    LABS (pertinent positives/negatives) (all labs ordered are listed, but only abnormal results are displayed) Labs Reviewed  RESP PANEL BY RT-PCR (FLU A&B, COVID) [id number]   ____________________________________________   EKG    ____________________________________________    RADIOLOGY  DG Chest Portable 1 View  Result Date: 09/28/2020 CLINICAL DATA:  Fatigue, vomiting. EXAM: PORTABLE CHEST 1 VIEW COMPARISON:  September 25, 2020. FINDINGS: Mild enlargement the cardiac silhouette. Right IJ central venous catheter with the tip projecting at the right atrium, unchanged. No consolidation. No visible pleural effusion or pneumothorax. No acute osseous abnormality. IMPRESSION: No acute cardiopulmonary disease. Electronically Signed   By: Margaretha Sheffield MD   On: 09/28/2020 14:50    ____________________________________________   PROCEDURES Procedures  ____________________________________________  DIFFERENTIAL DIAGNOSIS   Uncontrolled hypertension, electrolyte abnormality, viral illness, pneumonia  CLINICAL IMPRESSION / ASSESSMENT AND PLAN / ED COURSE  Medications ordered in the ED: Medications  irbesartan (AVAPRO) tablet 150 mg (has no administration in time range)  NIFEdipine (PROCARDIA-XL/NIFEDICAL-XL) 24 hr tablet 90 mg (has no administration in time range)  haloperidol lactate (HALDOL) injection 2.5 mg (2.5 mg Intravenous Given 09/28/20 1531)  diphenhydrAMINE (BENADRYL) injection 25 mg (25 mg Intravenous Given 09/28/20 1533)  carvedilol (COREG) tablet 37.5 mg  (37.5 mg Oral Given 09/28/20 1534)  sodium chloride 0.9 % bolus 500 mL (500 mLs Intravenous New Bag/Given 09/28/20 1542)    Pertinent labs & imaging results that were available during my care of the patient were reviewed by me and considered in my medical decision making (see chart for details).  Patricia Maldonado was evaluated in Emergency Department on 09/28/2020 for the symptoms described in the history of present illness. She was evaluated in the context of the global COVID-19 pandemic, which necessitated consideration that the patient might be at risk for infection with the SARS-CoV-2 virus that causes COVID-19. Institutional protocols and algorithms that pertain to the evaluation of  patients at risk for COVID-19 are in a state of rapid change based on information released by regulatory bodies including the CDC and federal and state organizations. These policies and algorithms were followed during the patient's care in the ED.   Patient presents with vomiting, multiple constitutional symptoms.  We will send Covid/flu swab.  Check chest x-ray.  Will give antihypertensives as well as Haldol and Benadryl for her headache syndrome.      ____________________________________________   FINAL CLINICAL IMPRESSION(S) / ED DIAGNOSES    Final diagnoses:  ESRD on hemodialysis ([place])  Uncontrolled hypertension     ED Discharge Orders    None      Portions of this note were generated with dragon dictation software. Dictation errors may occur despite best attempts at proofreading.   Carrie Mew, MD 09/28/20 1601

## 2020-09-28 NOTE — ED Provider Notes (Signed)
  Patient received in signout from Dr. Joni Fears pending provision of antihypertensives and results of blood work in the setting of emesis, headache and hypertension.  Blood work with minimal hypokalemia, otherwise unremarkable.  No urgent indications for hemodialysis.  Patient reports resolving symptoms after she received her antihypertensives and BP improves.  She reports that she is eager to go home, and I see no evidence of pathology to preclude outpatient management.  Clinical Course as of 09/28/20 1832  Mon Sep 28, 2020  [place] Reassessed.  Patient reports she feels much better and is eager to go home.  We discussed compliance with antihypertensive regimen and following up with her dialysis clinic.  We discussed return precautions for the ED. [DS]    Clinical Course User Index [DS] Vladimir Crofts, MD      Vladimir Crofts, MD 09/28/20 [phone]

## 2020-11-01 ENCOUNTER — Encounter: Payer: Self-pay | Admitting: Radiology

## 2020-11-01 ENCOUNTER — Emergency Department: Payer: Medicare Other

## 2020-11-01 ENCOUNTER — Inpatient Hospital Stay: Payer: Medicare Other

## 2020-11-01 ENCOUNTER — Inpatient Hospital Stay
Admission: EM | Admit: 2020-11-01 | Discharge: 2020-12-01 | DRG: 304 | Disposition: E | Payer: Medicare Other | Attending: Internal Medicine | Admitting: Internal Medicine

## 2020-11-01 DIAGNOSIS — G935 Compression of brain: Secondary | ICD-10-CM | POA: Diagnosis present

## 2020-11-01 DIAGNOSIS — F32A Depression, unspecified: Secondary | ICD-10-CM | POA: Diagnosis present

## 2020-11-01 DIAGNOSIS — D696 Thrombocytopenia, unspecified: Secondary | ICD-10-CM | POA: Diagnosis present

## 2020-11-01 DIAGNOSIS — G9341 Metabolic encephalopathy: Secondary | ICD-10-CM | POA: Diagnosis not present

## 2020-11-01 DIAGNOSIS — K921 Melena: Secondary | ICD-10-CM | POA: Diagnosis present

## 2020-11-01 DIAGNOSIS — I120 Hypertensive chronic kidney disease with stage 5 chronic kidney disease or end stage renal disease: Secondary | ICD-10-CM | POA: Diagnosis present

## 2020-11-01 DIAGNOSIS — Z992 Dependence on renal dialysis: Secondary | ICD-10-CM

## 2020-11-01 DIAGNOSIS — R4020 Unspecified coma: Secondary | ICD-10-CM | POA: Diagnosis present

## 2020-11-01 DIAGNOSIS — Z87442 Personal history of urinary calculi: Secondary | ICD-10-CM

## 2020-11-01 DIAGNOSIS — I959 Hypotension, unspecified: Secondary | ICD-10-CM | POA: Diagnosis present

## 2020-11-01 DIAGNOSIS — G039 Meningitis, unspecified: Secondary | ICD-10-CM | POA: Diagnosis present

## 2020-11-01 DIAGNOSIS — Z66 Do not resuscitate: Secondary | ICD-10-CM | POA: Diagnosis not present

## 2020-11-01 DIAGNOSIS — Z880 Allergy status to penicillin: Secondary | ICD-10-CM

## 2020-11-01 DIAGNOSIS — Z9115 Patient's noncompliance with renal dialysis: Secondary | ICD-10-CM

## 2020-11-01 DIAGNOSIS — J9602 Acute respiratory failure with hypercapnia: Secondary | ICD-10-CM | POA: Diagnosis present

## 2020-11-01 DIAGNOSIS — M3214 Glomerular disease in systemic lupus erythematosus: Secondary | ICD-10-CM | POA: Diagnosis present

## 2020-11-01 DIAGNOSIS — I10 Essential (primary) hypertension: Secondary | ICD-10-CM | POA: Diagnosis not present

## 2020-11-01 DIAGNOSIS — G40909 Epilepsy, unspecified, not intractable, without status epilepticus: Secondary | ICD-10-CM | POA: Diagnosis present

## 2020-11-01 DIAGNOSIS — G9340 Encephalopathy, unspecified: Secondary | ICD-10-CM

## 2020-11-01 DIAGNOSIS — Z720 Tobacco use: Secondary | ICD-10-CM | POA: Diagnosis not present

## 2020-11-01 DIAGNOSIS — J1282 Pneumonia due to coronavirus disease 2019: Secondary | ICD-10-CM | POA: Diagnosis present

## 2020-11-01 DIAGNOSIS — Z833 Family history of diabetes mellitus: Secondary | ICD-10-CM

## 2020-11-01 DIAGNOSIS — G053 Encephalitis and encephalomyelitis in diseases classified elsewhere: Secondary | ICD-10-CM | POA: Diagnosis present

## 2020-11-01 DIAGNOSIS — M795 Residual foreign body in soft tissue: Secondary | ICD-10-CM

## 2020-11-01 DIAGNOSIS — D72829 Elevated white blood cell count, unspecified: Secondary | ICD-10-CM | POA: Diagnosis present

## 2020-11-01 DIAGNOSIS — Z79899 Other long term (current) drug therapy: Secondary | ICD-10-CM

## 2020-11-01 DIAGNOSIS — R569 Unspecified convulsions: Secondary | ICD-10-CM

## 2020-11-01 DIAGNOSIS — M329 Systemic lupus erythematosus, unspecified: Secondary | ICD-10-CM | POA: Diagnosis present

## 2020-11-01 DIAGNOSIS — J9601 Acute respiratory failure with hypoxia: Secondary | ICD-10-CM

## 2020-11-01 DIAGNOSIS — I674 Hypertensive encephalopathy: Secondary | ICD-10-CM | POA: Diagnosis present

## 2020-11-01 DIAGNOSIS — I6783 Posterior reversible encephalopathy syndrome: Secondary | ICD-10-CM | POA: Diagnosis present

## 2020-11-01 DIAGNOSIS — G936 Cerebral edema: Secondary | ICD-10-CM | POA: Diagnosis present

## 2020-11-01 DIAGNOSIS — F17210 Nicotine dependence, cigarettes, uncomplicated: Secondary | ICD-10-CM | POA: Diagnosis present

## 2020-11-01 DIAGNOSIS — Z8349 Family history of other endocrine, nutritional and metabolic diseases: Secondary | ICD-10-CM

## 2020-11-01 DIAGNOSIS — R402433 Glasgow coma scale score 3-8, at hospital admission: Secondary | ICD-10-CM | POA: Diagnosis not present

## 2020-11-01 DIAGNOSIS — N186 End stage renal disease: Secondary | ICD-10-CM | POA: Diagnosis not present

## 2020-11-01 DIAGNOSIS — Z9114 Patient's other noncompliance with medication regimen: Secondary | ICD-10-CM

## 2020-11-01 DIAGNOSIS — K625 Hemorrhage of anus and rectum: Secondary | ICD-10-CM | POA: Diagnosis present

## 2020-11-01 DIAGNOSIS — D631 Anemia in chronic kidney disease: Secondary | ICD-10-CM | POA: Diagnosis not present

## 2020-11-01 DIAGNOSIS — U071 COVID-19: Secondary | ICD-10-CM | POA: Diagnosis present

## 2020-11-01 DIAGNOSIS — G928 Other toxic encephalopathy: Secondary | ICD-10-CM | POA: Diagnosis present

## 2020-11-01 DIAGNOSIS — M3219 Other organ or system involvement in systemic lupus erythematosus: Secondary | ICD-10-CM | POA: Diagnosis present

## 2020-11-01 DIAGNOSIS — G9382 Brain death: Secondary | ICD-10-CM | POA: Diagnosis not present

## 2020-11-01 DIAGNOSIS — R4182 Altered mental status, unspecified: Secondary | ICD-10-CM | POA: Diagnosis present

## 2020-11-01 DIAGNOSIS — Z515 Encounter for palliative care: Secondary | ICD-10-CM

## 2020-11-01 DIAGNOSIS — Z9119 Patient's noncompliance with other medical treatment and regimen: Secondary | ICD-10-CM

## 2020-11-01 DIAGNOSIS — I161 Hypertensive emergency: Secondary | ICD-10-CM | POA: Diagnosis present

## 2020-11-01 DIAGNOSIS — M320 Drug-induced systemic lupus erythematosus: Secondary | ICD-10-CM | POA: Diagnosis not present

## 2020-11-01 LAB — CBC
HCT: 33.9 % — ABNORMAL LOW (ref 36.0–46.0)
HCT: 34.6 % — ABNORMAL LOW (ref 36.0–46.0)
Hemoglobin: 10.6 g/dL — ABNORMAL LOW (ref 12.0–15.0)
Hemoglobin: 11.7 g/dL — ABNORMAL LOW (ref 12.0–15.0)
MCH: 28.8 pg (ref 26.0–34.0)
MCH: 29.8 pg (ref 26.0–34.0)
MCHC: 31.3 g/dL (ref 30.0–36.0)
MCHC: 33.8 g/dL (ref 30.0–36.0)
MCV: 92.1 fL (ref 80.0–100.0)
MCV: 88 fL (ref 80.0–100.0)
Platelets: 66 10*3/uL — ABNORMAL LOW (ref 150–400)
Platelets: 34 10*3/uL — ABNORMAL LOW (ref 150–400)
RBC: 3.68 MIL/uL — ABNORMAL LOW (ref 3.87–5.11)
RBC: 3.93 MIL/uL (ref 3.87–5.11)
RDW: 15.2 % (ref 11.5–15.5)
RDW: 15.2 % (ref 11.5–15.5)
WBC: 18.5 10*3/uL — ABNORMAL HIGH (ref 4.0–10.5)
WBC: 22.1 10*3/uL — ABNORMAL HIGH (ref 4.0–10.5)
nRBC: 0 % (ref 0.0–0.2)
nRBC: 0 % (ref 0.0–0.2)

## 2020-11-01 LAB — BLOOD GAS, ARTERIAL
Acid-base deficit: 13.1 mmol/L — ABNORMAL HIGH (ref 0.0–2.0)
Bicarbonate: 14.6 mmol/L — ABNORMAL LOW (ref 20.0–28.0)
FIO2: 0.28
MECHVT: 350 mL
O2 Saturation: 98.6 %
PEEP: 5 cmH2O
Patient temperature: 37
RATE: 16 resp/min
pCO2 arterial: 39 mmHg (ref 32.0–48.0)
pH, Arterial: 7.18 — CL (ref 7.350–7.450)
pO2, Arterial: 144 mmHg — ABNORMAL HIGH (ref 83.0–108.0)

## 2020-11-01 LAB — COMPREHENSIVE METABOLIC PANEL
ALT: 6 U/L (ref 0–44)
AST: 20 U/L (ref 15–41)
Albumin: 4.1 g/dL (ref 3.5–5.0)
Alkaline Phosphatase: 58 U/L (ref 38–126)
Anion gap: 23 — ABNORMAL HIGH (ref 5–15)
BUN: 97 mg/dL — ABNORMAL HIGH (ref 6–20)
CO2: 14 mmol/L — ABNORMAL LOW (ref 22–32)
Calcium: 10.3 mg/dL (ref 8.9–10.3)
Chloride: 106 mmol/L (ref 98–111)
Creatinine, Ser: 22.78 mg/dL — ABNORMAL HIGH (ref 0.44–1.00)
GFR, Estimated: 2 mL/min — ABNORMAL LOW (ref >60)
Glucose, Bld: 157 mg/dL — ABNORMAL HIGH (ref 70–99)
Potassium: 5.1 mmol/L (ref 3.5–5.1)
Sodium: 143 mmol/L (ref 135–145)
Total Bilirubin: 0.8 mg/dL (ref 0.3–1.2)
Total Protein: 8.3 g/dL — ABNORMAL HIGH (ref 6.5–8.1)

## 2020-11-01 LAB — FIBRINOGEN: Fibrinogen: 205 mg/dL — ABNORMAL LOW (ref 210–475)

## 2020-11-01 LAB — CBC WITH DIFFERENTIAL/PLATELET
Abs Immature Granulocytes: 0.15 10*3/uL — ABNORMAL HIGH (ref 0.00–0.07)
Basophils Absolute: 0 10*3/uL (ref 0.0–0.1)
Basophils Relative: 0 %
Eosinophils Absolute: 0 10*3/uL (ref 0.0–0.5)
Eosinophils Relative: 0 %
HCT: 33.7 % — ABNORMAL LOW (ref 36.0–46.0)
Hemoglobin: 10.6 g/dL — ABNORMAL LOW (ref 12.0–15.0)
Immature Granulocytes: 1 %
Lymphocytes Relative: 6 %
Lymphs Abs: 0.9 10*3/uL (ref 0.7–4.0)
MCH: 28.7 pg (ref 26.0–34.0)
MCHC: 31.5 g/dL (ref 30.0–36.0)
MCV: 91.3 fL (ref 80.0–100.0)
Monocytes Absolute: 0.4 10*3/uL (ref 0.1–1.0)
Monocytes Relative: 3 %
Neutro Abs: 13.4 10*3/uL — ABNORMAL HIGH (ref 1.7–7.7)
Neutrophils Relative %: 90 %
Platelets: 107 10*3/uL — ABNORMAL LOW (ref 150–400)
RBC: 3.69 MIL/uL — ABNORMAL LOW (ref 3.87–5.11)
RDW: 14.9 % (ref 11.5–15.5)
WBC: 14.9 10*3/uL — ABNORMAL HIGH (ref 4.0–10.5)
nRBC: 0 % (ref 0.0–0.2)

## 2020-11-01 LAB — BLOOD GAS, VENOUS
Acid-base deficit: 13.4 mmol/L — ABNORMAL HIGH (ref 0.0–2.0)
Bicarbonate: 13.7 mmol/L — ABNORMAL LOW (ref 20.0–28.0)
O2 Saturation: 82.3 %
Patient temperature: 37
pCO2, Ven: 35 mmHg — ABNORMAL LOW (ref 44.0–60.0)
pH, Ven: 7.2 — ABNORMAL LOW (ref 7.250–7.430)
pO2, Ven: 58 mmHg — ABNORMAL HIGH (ref 32.0–45.0)

## 2020-11-01 LAB — PROTIME-INR
INR: 1.1 (ref 0.8–1.2)
Prothrombin Time: 13.4 seconds (ref 11.4–15.2)

## 2020-11-01 LAB — CBG MONITORING, ED: Glucose-Capillary: 145 mg/dL — ABNORMAL HIGH (ref 70–99)

## 2020-11-01 LAB — MAGNESIUM: Magnesium: 2.6 mg/dL — ABNORMAL HIGH (ref 1.7–2.4)

## 2020-11-01 LAB — TROPONIN I (HIGH SENSITIVITY)
Troponin I (High Sensitivity): 99 ng/L — ABNORMAL HIGH (ref <18)
Troponin I (High Sensitivity): 107 ng/L (ref <18)

## 2020-11-01 LAB — TYPE AND SCREEN
ABO/RH(D): B POS
Antibody Screen: NEGATIVE

## 2020-11-01 LAB — PROCALCITONIN: Procalcitonin: 0.29 ng/mL

## 2020-11-01 LAB — SARS CORONAVIRUS 2 BY RT PCR (HOSPITAL ORDER, PERFORMED IN ~~LOC~~ HOSPITAL LAB): SARS Coronavirus 2: POSITIVE — AB

## 2020-11-01 LAB — FOLATE: Folate: 8.6 ng/mL (ref >5.9)

## 2020-11-01 LAB — HCG, QUANTITATIVE, PREGNANCY: hCG, Beta Chain, Quant, S: 2 m[IU]/mL (ref <5)

## 2020-11-01 LAB — APTT: aPTT: 31 seconds (ref 24–36)

## 2020-11-01 LAB — FIBRIN DERIVATIVES D-DIMER (ARMC ONLY): Fibrin derivatives D-dimer (ARMC): >7500 ng/mL (FEU) — ABNORMAL HIGH (ref 0.00–499.00)

## 2020-11-01 LAB — MRSA PCR SCREENING: MRSA by PCR: NEGATIVE

## 2020-11-01 LAB — ETHANOL: Alcohol, Ethyl (B): <10 mg/dL (ref <10)

## 2020-11-01 LAB — LACTIC ACID, PLASMA: Lactic Acid, Venous: 2.3 mmol/L (ref 0.5–1.9)

## 2020-11-01 LAB — FERRITIN: Ferritin: 4222 ng/mL — ABNORMAL HIGH (ref 11–307)

## 2020-11-01 MED ORDER — POLYETHYLENE GLYCOL 3350 17 G PO PACK
17.0000 g | PACK | Freq: Every day | ORAL | Status: DC
  Administered 2020-11-03: 17 g
  Filled 2020-11-01: qty 1

## 2020-11-01 MED ORDER — ONDANSETRON HCL 4 MG/2ML IJ SOLN: 4.0000 mg | Freq: Three times a day (TID) | INTRAMUSCULAR | Status: DC | PRN

## 2020-11-01 MED ORDER — SODIUM CHLORIDE 0.9 % IV SOLN
INTRAVENOUS | Status: DC | PRN
  Administered 2020-11-01 – 2020-11-03 (×3): 250 mL via INTRAVENOUS

## 2020-11-01 MED ORDER — ONDANSETRON HCL 4 MG PO TABS: 4.0000 mg | ORAL_TABLET | Freq: Four times a day (QID) | ORAL | Status: DC | PRN

## 2020-11-01 MED ORDER — PANTOPRAZOLE SODIUM 40 MG IV SOLR
40.0000 mg | Freq: Two times a day (BID) | INTRAVENOUS | Status: DC
  Administered 2020-11-01 – 2020-11-03 (×5): 40 mg via INTRAVENOUS
  Filled 2020-11-01 (×5): qty 40

## 2020-11-01 MED ORDER — HYDRALAZINE HCL 20 MG/ML IJ SOLN
10.0000 mg | Freq: Three times a day (TID) | INTRAMUSCULAR | Status: DC
  Administered 2020-11-01 – 2020-11-03 (×3): 10 mg via INTRAVENOUS
  Filled 2020-11-01 (×4): qty 1

## 2020-11-01 MED ORDER — ACETAMINOPHEN 325 MG PO TABS: 650.0000 mg | ORAL_TABLET | Freq: Four times a day (QID) | ORAL | Status: DC | PRN

## 2020-11-01 MED ORDER — MIDAZOLAM HCL 2 MG/2ML IJ SOLN
INTRAMUSCULAR | Status: AC
  Filled 2020-11-01: qty 2

## 2020-11-01 MED ORDER — LIDOCAINE HCL (PF) 1 % IJ SOLN
5.0000 mL | INTRAMUSCULAR | Status: DC | PRN
  Filled 2020-11-01: qty 5

## 2020-11-01 MED ORDER — MIDAZOLAM HCL 5 MG/5ML IJ SOLN
4.0000 mg | Freq: Once | INTRAMUSCULAR | Status: AC
  Administered 2020-11-01: 4 mg via INTRAVENOUS

## 2020-11-01 MED ORDER — ACETAMINOPHEN 650 MG RE SUPP: 650.0000 mg | Freq: Four times a day (QID) | RECTAL | Status: DC | PRN

## 2020-11-01 MED ORDER — SODIUM CHLORIDE 0.9 % IV SOLN
18.0000 ug | Freq: Once | INTRAVENOUS | Status: AC
  Administered 2020-11-01: 18 ug via INTRAVENOUS
  Filled 2020-11-01: qty 4.5

## 2020-11-01 MED ORDER — PROPOFOL 1000 MG/100ML IV EMUL
INTRAVENOUS | Status: AC
  Administered 2020-11-01: 20 ug/kg/min via INTRAVENOUS
  Filled 2020-11-01: qty 100

## 2020-11-01 MED ORDER — PHENYLEPHRINE CONCENTRATED 100MG/250ML (0.4 MG/ML) INFUSION SIMPLE
0.0000 ug/min | INTRAVENOUS | Status: DC
  Filled 2020-11-01: qty 250

## 2020-11-01 MED ORDER — EPOETIN ALFA 2000 UNIT/ML IJ SOLN
2000.0000 [IU] | INTRAMUSCULAR | Status: DC
  Filled 2020-11-01 (×2): qty 1

## 2020-11-01 MED ORDER — NICARDIPINE HCL IN NACL 20-0.86 MG/200ML-% IV SOLN: 3.0000 mg/h | INTRAVENOUS | Status: DC

## 2020-11-01 MED ORDER — SODIUM CHLORIDE 0.9 % IV SOLN: 100.0000 mL | INTRAVENOUS | Status: DC | PRN

## 2020-11-01 MED ORDER — HEPARIN SODIUM (PORCINE) 1000 UNIT/ML DIALYSIS
1000.0000 [IU] | INTRAMUSCULAR | Status: DC | PRN
  Filled 2020-11-01: qty 1

## 2020-11-01 MED ORDER — DOCUSATE SODIUM 50 MG/5ML PO LIQD
100.0000 mg | Freq: Two times a day (BID) | ORAL | Status: DC
  Administered 2020-11-02 – 2020-11-03 (×3): 100 mg
  Filled 2020-11-01 (×2): qty 10

## 2020-11-01 MED ORDER — LABETALOL HCL 5 MG/ML IV SOLN: 10.0000 mg | Freq: Once | INTRAVENOUS | Status: DC

## 2020-11-01 MED ORDER — HEPARIN SODIUM (PORCINE) 1000 UNIT/ML DIALYSIS
20.0000 [IU]/kg | INTRAMUSCULAR | Status: DC | PRN
  Filled 2020-11-01: qty 1

## 2020-11-01 MED ORDER — DROPERIDOL 2.5 MG/ML IJ SOLN
5.0000 mg | Freq: Once | INTRAMUSCULAR | Status: AC
  Administered 2020-11-01: 5 mg via INTRAVENOUS
  Filled 2020-11-01: qty 2

## 2020-11-01 MED ORDER — VANCOMYCIN VARIABLE DOSE PER UNSTABLE RENAL FUNCTION (PHARMACIST DOSING): Status: DC

## 2020-11-01 MED ORDER — CHLORHEXIDINE GLUCONATE CLOTH 2 % EX PADS
6.0000 | MEDICATED_PAD | Freq: Every day | CUTANEOUS | Status: DC
  Filled 2020-11-01 (×2): qty 6

## 2020-11-01 MED ORDER — VANCOMYCIN HCL IN DEXTROSE 1-5 GM/200ML-% IV SOLN
1000.0000 mg | Freq: Once | INTRAVENOUS | Status: AC
  Administered 2020-11-01: 1000 mg via INTRAVENOUS
  Filled 2020-11-01: qty 200

## 2020-11-01 MED ORDER — PROPOFOL 1000 MG/100ML IV EMUL: 5.0000 ug/kg/min | INTRAVENOUS | Status: DC

## 2020-11-01 MED ORDER — LABETALOL HCL 5 MG/ML IV SOLN
20.0000 mg | Freq: Once | INTRAVENOUS | Status: AC
  Administered 2020-11-01: 20 mg via INTRAVENOUS
  Filled 2020-11-01: qty 4

## 2020-11-01 MED ORDER — HYDROCORTISONE NA SUCCINATE PF 100 MG IJ SOLR
100.0000 mg | Freq: Four times a day (QID) | INTRAMUSCULAR | Status: DC
  Administered 2020-11-01 – 2020-11-03 (×8): 100 mg via INTRAVENOUS
  Filled 2020-11-01 (×8): qty 2

## 2020-11-01 MED ORDER — DM-GUAIFENESIN ER 30-600 MG PO TB12: 1.0000 | ORAL_TABLET | Freq: Two times a day (BID) | ORAL | Status: DC | PRN

## 2020-11-01 MED ORDER — SODIUM CHLORIDE 0.9 % IV SOLN
2.0000 g | Freq: Two times a day (BID) | INTRAVENOUS | Status: DC
  Administered 2020-11-02 – 2020-11-03 (×4): 2 g via INTRAVENOUS
  Filled 2020-11-01 (×2): qty 20
  Filled 2020-11-01: qty 2
  Filled 2020-11-01 (×2): qty 20

## 2020-11-01 MED ORDER — ROCURONIUM BROMIDE 50 MG/5ML IV SOLN
100.0000 mg | Freq: Once | INTRAVENOUS | Status: AC
  Administered 2020-11-01: 100 mg via INTRAVENOUS

## 2020-11-01 MED ORDER — ALBUTEROL SULFATE HFA 108 (90 BASE) MCG/ACT IN AERS
2.0000 | INHALATION_SPRAY | RESPIRATORY_TRACT | Status: DC | PRN
  Filled 2020-11-01: qty 6.7

## 2020-11-01 MED ORDER — LEVETIRACETAM IN NACL 1000 MG/100ML IV SOLN
1000.0000 mg | Freq: Once | INTRAVENOUS | Status: AC
  Administered 2020-11-01: 1000 mg via INTRAVENOUS
  Filled 2020-11-01: qty 100

## 2020-11-01 MED ORDER — LORAZEPAM 2 MG/ML IJ SOLN
2.0000 mg | INTRAMUSCULAR | Status: DC | PRN
  Administered 2020-11-01 (×2): 2 mg via INTRAVENOUS
  Filled 2020-11-01 (×2): qty 1

## 2020-11-01 MED ORDER — LIDOCAINE-PRILOCAINE 2.5-2.5 % EX CREA
1.0000 "application " | TOPICAL_CREAM | CUTANEOUS | Status: DC | PRN
  Filled 2020-11-01: qty 5

## 2020-11-01 MED ORDER — CHLORHEXIDINE GLUCONATE CLOTH 2 % EX PADS
6.0000 | MEDICATED_PAD | Freq: Every day | CUTANEOUS | Status: DC
  Administered 2020-11-02: 6 via TOPICAL

## 2020-11-01 MED ORDER — CHLORHEXIDINE GLUCONATE 0.12% ORAL RINSE (MEDLINE KIT)
15.0000 mL | Freq: Two times a day (BID) | OROMUCOSAL | Status: DC
  Administered 2020-11-02 – 2020-11-03 (×4): 15 mL via OROMUCOSAL

## 2020-11-01 MED ORDER — ALBUMIN HUMAN 5 % IV SOLN
12.5000 g | Freq: Once | INTRAVENOUS | Status: DC
  Filled 2020-11-01: qty 250

## 2020-11-01 MED ORDER — MIDAZOLAM HCL 2 MG/2ML IJ SOLN
2.0000 mg | Freq: Once | INTRAMUSCULAR | Status: AC
  Administered 2020-11-01: 2 mg via INTRAVENOUS

## 2020-11-01 MED ORDER — ORAL CARE MOUTH RINSE
15.0000 mL | OROMUCOSAL | Status: DC
  Administered 2020-11-02 – 2020-11-03 (×17): 15 mL via OROMUCOSAL

## 2020-11-01 MED ORDER — SODIUM CHLORIDE 0.9% IV SOLUTION
Freq: Once | INTRAVENOUS | Status: AC
  Filled 2020-11-01: qty 250

## 2020-11-01 MED ORDER — PENTAFLUOROPROP-TETRAFLUOROETH EX AERO
1.0000 "application " | INHALATION_SPRAY | CUTANEOUS | Status: DC | PRN
  Filled 2020-11-01: qty 30

## 2020-11-01 MED ORDER — LEVETIRACETAM IN NACL 500 MG/100ML IV SOLN
500.0000 mg | Freq: Two times a day (BID) | INTRAVENOUS | Status: DC
  Administered 2020-11-02 – 2020-11-03 (×3): 500 mg via INTRAVENOUS
  Filled 2020-11-01 (×7): qty 100

## 2020-11-01 MED ORDER — VANCOMYCIN HCL IN DEXTROSE 1-5 GM/200ML-% IV SOLN: 1000.0000 mg | Freq: Once | INTRAVENOUS | Status: DC

## 2020-11-01 MED ORDER — VASOPRESSIN 20 UNITS/100 ML INFUSION FOR SHOCK
0.0000 [IU]/min | INTRAVENOUS | Status: DC
  Administered 2020-11-01: 0.03 [IU]/min via INTRAVENOUS
  Filled 2020-11-01: qty 100

## 2020-11-01 MED ORDER — ALTEPLASE 2 MG IJ SOLR
2.0000 mg | Freq: Once | INTRAMUSCULAR | Status: DC | PRN
  Filled 2020-11-01: qty 2

## 2020-11-01 MED ORDER — HYDRALAZINE HCL 20 MG/ML IJ SOLN
5.0000 mg | INTRAMUSCULAR | Status: DC | PRN
  Administered 2020-11-02: 5 mg via INTRAVENOUS
  Filled 2020-11-01: qty 1

## 2020-11-01 MED ORDER — NICOTINE 21 MG/24HR TD PT24
21.0000 mg | MEDICATED_PATCH | Freq: Every day | TRANSDERMAL | Status: DC
  Administered 2020-11-02: 21 mg via TRANSDERMAL
  Filled 2020-11-01: qty 1

## 2020-11-01 MED ORDER — PHENYLEPHRINE HCL-NACL 10-0.9 MG/250ML-% IV SOLN
0.0000 ug/min | INTRAVENOUS | Status: DC
  Administered 2020-11-01: 50 ug/min via INTRAVENOUS
  Administered 2020-11-01: 20 ug/min via INTRAVENOUS
  Administered 2020-11-01: 50 ug/min via INTRAVENOUS
  Filled 2020-11-01 (×2): qty 250

## 2020-11-01 NOTE — ED Notes (Signed)
PT noted to be hypotensive, propofol d/c'd, Dr. Patsey Berthold notified, orders for additional liter of fluid.  Dialysis infused 1 L NS through dialysis at this time.

## 2020-11-01 NOTE — Procedures (Signed)
Central Venous Catheter Insertion Procedure Note  Patricia Maldonado  [id number]  14-Jun-1981  Date:10/13/2020  Time:11:43 PM   Provider Performing:Allean Montfort L Rust-[place]ster   Procedure: Insertion of Non-tunneled Central Venous [phone]) with US guidance [id number])   Indication(s) Medication administration and Difficult access  Consent Risks of the procedure as well as the alternatives and risks of each were explained to the patient and/or caregiver.  Consent for the procedure was obtained and is signed in the bedside chart  Anesthesia Topical only with 1% lidocaine   Timeout Verified patient identification, verified procedure, site/side was marked, verified correct patient position, special equipment/implants available, medications/allergies/relevant history reviewed, required imaging and test results available.  Sterile Technique Maximal sterile technique including full sterile barrier drape, hand hygiene, sterile gown, sterile gloves, mask, hair covering, sterile ultrasound probe cover (if used).  Procedure Description Area of catheter insertion was cleaned with chlorhexidine and draped in sterile fashion.  With real-time ultrasound guidance a central venous catheter was placed into the right femoral vein. Nonpulsatile blood flow and easy flushing noted in all ports.  The catheter was sutured in place and sterile dressing applied.  Complications/Tolerance None; patient tolerated the procedure well. Chest X-ray is ordered to verify placement for internal jugular or subclavian cannulation.   Chest x-ray is not ordered for femoral cannulation.  EBL Minimal  Specimen(s) None  Patient's mother Patricia Maldonado consented for procedure.  [name], AGACNP-BC Acute Care Nurse Practitioner [facility]   [phone] / [phone] Please see Amion for pager details.

## 2020-11-01 NOTE — ED Notes (Signed)
Pt moved to room 17 in anticipation of dialysis.  Pt continues to be altered and unable to follow any commands.  Pt continues with constant movement of all four extremities and constant repositioning in bed.  Safety sitter remains at bedside, pt is unable to be redirected.  Pt cleaned of moderate amount of serosanguinous fluid in brief that originates from rectum.  Dr. Blaine Hamper in room to assess pt, no new orders at this time.  Will continue to monitor.

## 2020-11-01 NOTE — ED Notes (Signed)
Called to room by sitter and Dialysis nurse.  Pt cool, clammy, unresponsive, irregular respirations that are ineffective.  Pt arms tonic.  Pulse ox continues to read 100%, but HR is 30.  Pt placed on monitor, BP cycled and doctor paged.

## 2020-11-01 NOTE — Consult Note (Addendum)
Reason for Consult: Acute respiratory failure with hypoxia in the setting of seizure and COVID-19 infection Referring Physician: Ivor Costa, MD  Patricia Maldonado is an 40 y.o. female : Underlying history of lupus and ESRD on dialysis.  Please note that could not be obtained from the patient due to patient being intubated and mechanically ventilated.  History is obtained from the available records and speaking to the ER staff.   HPI:   Patient is a 40 year old, current smoker, with a very complex medical history to include SLE with lupus nephritis, ESRD on hemodialysis (MWF) hypertension, prior episode of PRES and thrombocytopenia who presented to the emergency room today after being noted to be unresponsive post a seizure at home.  Details of this are sketchy.  I attempted to obtain history from the patient's mother however she was not able to provide much information as she does not live with the patient.  She did say that she was noted to have a seizure this morning.  She apparently has a daughter who is 64 years old and cannot make decisions for her mother so the patient's mother makes decisions.  The patient apparently had been quarantining at home for the last week due to potential COVID-19 and had not been going to dialysis.  She apparently had not been taking her antiepileptic medication (Keppra).  Initially she was noted to be combative and delirious not following commands, she was noted to have rectal bleeding with a loose stool, she was noted to be very hypertensive with initial blood pressure of 212/123 which improved after receiving labetalol in the emergency room.  No further history could be obtained.  ED course: Patient has tested positive for COVID-19.  She was noted to have fecal occult blood positive and has been evaluated by GI and by nephrology.  At around 12:15 PM the emergency room physician was called to the patient's bedside because of the patient having gurgling respirations.  She  apparently had had an apparent seizure.  She was noted to be tachycardic and hypertensive and at that point was intubated to protect her airway.  PCCM consulted for management of respiratory failure now mechanically ventilated.  She is currently being dialyzed.    Past Medical History:  Diagnosis Date  . Acute renal failure (ARF) ([place])   . Anemia   . Arthritis   . History of kidney stones   . Hypertension    OFF BP MEDS X 3 WEEKS DUE TO BP CONTROL  . Lupus nephritis ([place]) 12/2016   DX DURING HOSPITAL ADMISSION  . Rash   . Staghorn calculus   . 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[place]) 10/2016   DX DURING HOSPITAL ADMISSION    Past Surgical History:  Procedure Laterality Date  . CYSTOSCOPY/URETEROSCOPY/HOLMIUM LASER/STENT PLACEMENT Right 02/21/2017   Procedure: CYSTOSCOPY/URETEROSCOPY/HOLMIUM LASER/STENT EXCHANGE;  Surgeon: [name], MD;  Location: [facility];  Service: Urology;  Laterality: Right;  . DIALYSIS/PERMA CATHETER INSERTION N/A 11/30/2018   Procedure: DIALYSIS/PERMA CATHETER INSERTION;  Surgeon: Algernon Huxley, MD;  Location: [place] [facility];  Service: Cardiovascular;  Laterality: N/A;  . DIALYSIS/PERMA CATHETER INSERTION N/A 07/07/2020   Procedure: DIALYSIS/PERMA CATHETER INSERTION;  Surgeon: Katha Cabal, MD;  Location: [place] [facility];  Service: Cardiovascular;  Laterality: N/A;  . IR NEPHROSTOMY PLACEMENT RIGHT  01/23/2017  . NEPHROLITHOTOMY Right 01/24/2017   Procedure: NEPHROLITHOTOMY PERCUTANEOUS;  Surgeon: [name], MD;  Location: [facility];  Service: Urology;  Laterality: Right;  . PERITONEAL CATHETER INSERTION    .  TUBAL LIGATION      Family History  Problem Relation Age of Onset  . Thyroid disease Mother   . Thyroid disease Father   . Diabetes Father     Social History   Tobacco Use  . Smoking status: Current Every Day Smoker    Packs/day: 0.25    Years: 15.00    Pack years: 3.75    Types: Cigarettes  . Smokeless tobacco: Never Used  Substance Use  Topics  . Alcohol use: No   Allergies:  Allergies  Allergen Reactions  . Penicillins Swelling    Yeast infection Has patient had a PCN reaction causing immediate rash, facial/tongue/throat swelling, SOB or lightheadedness with hypotension:No Has patient had a PCN reaction causing severe rash involving mucus membranes or skin necrosis: Yes Has patient had a PCN reaction that required hospitalization No Has patient had a PCN reaction occurring within the last 10 years: No If all of the above answers are "NO", then may proceed with Cephalosporin use.  Yeast infection Has patient had a PCN reaction causing immediate rash, facial/tongue/throat swelling, SOB or lightheadedness with hypotension:No Has patient had a PCN reaction causing severe rash involving mucus membranes or skin necrosis: Yes Has patient had a PCN reaction that required hospitalization No Has patient had a PCN reaction occurring within the last 10 years: No If all of the above answers are "NO", then may proceed with Cephalosporin use.    Current Facility-Administered Medications:  .  0.9 %  sodium chloride infusion, 100 mL, Intravenous, PRN, Bhutani, Manpreet S, MD .  0.9 %  sodium chloride infusion, 100 mL, Intravenous, PRN, Bhutani, Manpreet S, MD .  acetaminophen (TYLENOL) suppository 650 mg, 650 mg, Rectal, Q6H PRN, Ivor Costa, MD .  acetaminophen (TYLENOL) tablet 650 mg, 650 mg, Oral, Q6H PRN, Ivor Costa, MD .  albuterol (VENTOLIN HFA) 108 (90 Base) MCG/ACT inhaler 2 puff, 2 puff, Inhalation, Q4H PRN, Ivor Costa, MD .  alteplase (CATHFLO ACTIVASE) injection 2 mg, 2 mg, Intracatheter, Once PRN, Bhutani, Manpreet S, MD .  Chlorhexidine Gluconate Cloth 2 % PADS 6 each, 6 each, Topical, Q0600, Bhutani, Manpreet S, MD .  dextromethorphan-guaiFENesin ([place] DM) 30-600 MG per 12 hr tablet 1 tablet, 1 tablet, Oral, BID PRN, Ivor Costa, MD .  epoetin alfa (EPOGEN) injection 2,000 Units, 2,000 Units, Subcutaneous, Q  T,Th,Sa-HD, Bhutani, Manpreet S, MD .  heparin injection 1,000 Units, 1,000 Units, Dialysis, PRN, Bhutani, Manpreet S, MD .  heparin injection 900 Units, 20 Units/kg, Dialysis, PRN, Bhutani, Manpreet S, MD .  hydrALAZINE (APRESOLINE) injection 10 mg, 10 mg, Intravenous, Q8H, Ivor Costa, MD .  hydrALAZINE (APRESOLINE) injection 5 mg, 5 mg, Intravenous, Q2H PRN, Ivor Costa, MD .  labetalol (NORMODYNE) injection 20 mg, 20 mg, Intravenous, Once, Ivor Costa, MD .  levETIRAcetam (KEPPRA) IVPB 500 mg/100 mL premix, 500 mg, Intravenous, Q12H, Ivor Costa, MD .  lidocaine (PF) (XYLOCAINE) 1 % injection 5 mL, 5 mL, Intradermal, PRN, Bhutani, Manpreet S, MD .  lidocaine-prilocaine (EMLA) cream 1 application, 1 application, Topical, PRN, Bhutani, Manpreet S, MD .  LORazepam (ATIVAN) injection 2 mg, 2 mg, Intravenous, Q2H PRN, Ivor Costa, MD, 2 mg at 10/07/2020 [id number] .  nicotine (NICODERM CQ - dosed in mg/24 hours) patch 21 mg, 21 mg, Transdermal, Daily, Ivor Costa, MD .  ondansetron (ZOFRAN) injection 4 mg, 4 mg, Intravenous, Q8H PRN, Ivor Costa, MD .  pantoprazole (PROTONIX) injection 40 mg, 40 mg, Intravenous, Q12H, Ivor Costa, MD, 40 mg at 10/12/2020  1039 .  pentafluoroprop-tetrafluoroeth (GEBAUERS) aerosol 1 application, 1 application, Topical, PRN, Bhutani, Manpreet S, MD .  propofol (DIPRIVAN) 1000 MG/100ML infusion, 5-80 mcg/kg/min, Intravenous, Continuous, Lucrezia Starch, MD, Last Rate: 8.17 mL/hr at 10/20/2020 1222, 30 mcg/kg/min at 10/23/2020 1222  Current Outpatient Medications:  .  B Complex-C-Zn-Folic Acid (DIALYVITE/ZINC) TABS, Take 1 tablet by mouth at bedtime., Disp: , Rfl:  .  calcitRIOL (ROCALTROL) 0.5 MCG capsule, 2 Capsule(s) By Mouth Daily, Disp: , Rfl:  .  carvedilol (COREG) 12.5 MG tablet, Take 3 tablets (37.5 mg total) by mouth 2 (two) times daily with a meal. tablet Take 3 tablets (37.5 mg total) by mouth two (2) times a day, Disp: 180 tablet, Rfl: 1 .  cinacalcet (SENSIPAR) 30 MG tablet,  Take 30 mg by mouth daily., Disp: , Rfl:  .  citalopram (CELEXA) 10 MG tablet, Take 10 mg by mouth daily., Disp: , Rfl:  .  hydroxychloroquine (PLAQUENIL) 200 MG tablet, , Disp: , Rfl:  .  hydroxyurea (DROXIA) 200 MG capsule, Take 200 mg by mouth daily., Disp: , Rfl:  .  levETIRAcetam (KEPPRA) 500 MG tablet, TAKE 1 TABLET BY MOUTH TWICE DAILY WITH ADDITIONAL 1 TABLET AFTER DIALYSIS, Disp: , Rfl:  .  mycophenolate (CELLCEPT) 500 MG tablet, Take 2 tablets (1,000 mg total) by mouth 2 (two) times daily., Disp: 120 tablet, Rfl: 0 .  NIFEdipine (PROCARDIA XL/NIFEDICAL-XL) 90 MG 24 hr tablet, Take 1 tablet (90 mg total) by mouth daily., Disp: 30 tablet, Rfl: 0 .  ondansetron (ZOFRAN-ODT) 4 MG disintegrating tablet, Take 4 mg by mouth every 6 (six) hours as needed for nausea/vomiting., Disp: , Rfl:  .  prochlorperazine (COMPAZINE) 10 MG tablet, Take 1 tablet (10 mg total) by mouth every 6 (six) hours as needed for up to 15 doses (30 Min prior to dialysis.)., Disp: 15 tablet, Rfl: 0 .  sevelamer carbonate (RENVELA) 800 MG tablet, Take 1,600 mg by mouth 3 (three) times daily., Disp: , Rfl:  .  valsartan (DIOVAN) 160 MG tablet, Take 1 tablet (160 mg total) by mouth daily., Disp: 30 tablet, Rfl: 0   Results for orders placed or performed during the hospital encounter of 10/08/2020 (from the past 48 hour(s))  CBC WITH DIFFERENTIAL     Status: Abnormal   Collection Time: 10/30/2020  4:59 AM  Result Value Ref Range   WBC 14.9 (H) 4.0 - 10.5 K/uL   RBC 3.69 (L) 3.87 - 5.11 MIL/uL   Hemoglobin 10.6 (L) 12.0 - 15.0 g/dL   HCT 33.7 (L) 36.0 - 46.0 %   MCV 91.3 80.0 - 100.0 fL   MCH 28.7 26.0 - 34.0 pg   MCHC 31.5 30.0 - 36.0 g/dL   RDW 14.9 11.5 - 15.5 %   Platelets 107 (L) 150 - 400 K/uL    Comment: Immature Platelet Fraction may be clinically indicated, consider ordering this additional test [id number]    nRBC 0.0 0.0 - 0.2 %   Neutrophils Relative % 90 %   Neutro Abs 13.4 (H) 1.7 - 7.7 K/uL   Lymphocytes  Relative 6 %   Lymphs Abs 0.9 0.7 - 4.0 K/uL   Monocytes Relative 3 %   Monocytes Absolute 0.4 0.1 - 1.0 K/uL   Eosinophils Relative 0 %   Eosinophils Absolute 0.0 0.0 - 0.5 K/uL   Basophils Relative 0 %   Basophils Absolute 0.0 0.0 - 0.1 K/uL   Immature Granulocytes 1 %   Abs Immature Granulocytes 0.15 (H) 0.00 -  0.07 K/uL    Comment: Performed at [facility], [address] [place], [place] [address]  Comprehensive metabolic panel     Status: Abnormal   Collection Time: 10/14/2020  4:59 AM  Result Value Ref Range   Sodium 143 135 - 145 mmol/L    Comment: ELECTROLYTES REPEATED.PMF   Potassium 5.1 3.5 - 5.1 mmol/L   Chloride 106 98 - 111 mmol/L   CO2 14 (L) 22 - 32 mmol/L   Glucose, Bld 157 (H) 70 - 99 mg/dL    Comment: Glucose reference range applies only to samples taken after fasting for at least 8 hours.   BUN 97 (H) 6 - 20 mg/dL   Creatinine, Ser 22.78 (H) 0.44 - 1.00 mg/dL   Calcium 10.3 8.9 - 10.3 mg/dL   Total Protein 8.3 (H) 6.5 - 8.1 g/dL   Albumin 4.1 3.5 - 5.0 g/dL   AST 20 15 - 41 U/L   ALT 6 0 - 44 U/L   Alkaline Phosphatase 58 38 - 126 U/L   Total Bilirubin 0.8 0.3 - 1.2 mg/dL   GFR, Estimated 2 (L) >60 mL/min    Comment: (NOTE) Calculated using the CKD-EPI Creatinine Equation (2021)    Anion gap 23 (H) 5 - 15    Comment: Performed at [facility], [address], [place], [place] [address]  Magnesium     Status: Abnormal   Collection Time: 10/29/2020  4:59 AM  Result Value Ref Range   Magnesium 2.6 (H) 1.7 - 2.4 mg/dL    Comment: Performed at [facility], [address] [place], [place] [address]  Ethanol/ETOH     Status: None   Collection Time: 10/19/2020  4:59 AM  Result Value Ref Range   Alcohol, Ethyl (B) <10 <10 mg/dL    Comment: (NOTE) Lowest detectable limit for serum alcohol is 10 mg/dL.  For medical purposes only. Performed at [facility], [address] [place], [place] [address]   SARS Coronavirus 2  by RT PCR (hospital order, performed in [facility] hospital lab) Nasopharyngeal Nasopharyngeal Swab     Status: Abnormal   Collection Time: 10/15/2020  4:59 AM   Specimen: Nasopharyngeal Swab  Result Value Ref Range   SARS Coronavirus 2 POSITIVE (A) NEGATIVE    Comment: RESULT CALLED TO, READ BACK BY AND VERIFIED WITH:  DAWN TULLOCH AT 0600 10/23/2020 SDR (NOTE) SARS-CoV-2 target nucleic acids are DETECTED  SARS-CoV-2 RNA is generally detectable in upper respiratory specimens  during the acute phase of infection.  Positive results are indicative  of the presence of the identified virus, but do not rule out bacterial infection or co-infection with other pathogens not detected by the test.  Clinical correlation with patient history and  other diagnostic information is necessary to determine patient infection status.  The expected result is negative.  Fact Sheet for Patients:   StrictlyIdeas.no   Fact Sheet for Healthcare Providers:   BankingDealers.co.za    This test is not yet approved or cleared by the [place] FDA and  has been authorized for detection and/or diagnosis of SARS-CoV-2 by FDA under an Emergency Use Authorization (EUA).  This EUA will remain in effect (meaning this tes t can be used) for the duration of  the COVID-19 declaration under Section 564(b)(1) of the Act, 21 U.S.C. section 360-bbb-3(b)(1), unless the authorization is terminated or revoked sooner.  Performed at [facility], [address], [place],  [address]   CBC  Status: Abnormal   Collection Time: 10/15/2020  8:38 AM  Result Value Ref Range   WBC 18.5 (H) 4.0 - 10.5 K/uL   RBC 3.68 (L) 3.87 - 5.11 MIL/uL   Hemoglobin 10.6 (L) 12.0 - 15.0 g/dL   HCT 33.9 (L) 36.0 - 46.0 %   MCV 92.1 80.0 - 100.0 fL   MCH 28.8 26.0 - 34.0 pg   MCHC 31.3 30.0 - 36.0 g/dL   RDW 15.2 11.5 - 15.5 %   Platelets 66 (L) 150 - 400 K/uL    Comment: Immature  Platelet Fraction may be clinically indicated, consider ordering this additional test [id number]    nRBC 0.0 0.0 - 0.2 %    Comment: Performed at [facility], [address] [place], [place] [address]  Protime-INR     Status: None   Collection Time: 10/25/2020 10:27 AM  Result Value Ref Range   Prothrombin Time 13.4 11.4 - 15.2 seconds   INR 1.1 0.8 - 1.2    Comment: (NOTE) INR goal varies based on device and disease states. Performed at [facility], [address] [place], [place] [address]   APTT     Status: None   Collection Time: 10/12/2020 10:27 AM  Result Value Ref Range   aPTT 31 24 - 36 seconds    Comment: Performed at [facility], [place], [place] [address]  Lactic acid, plasma     Status: Abnormal   Collection Time: 10/03/2020 10:27 AM  Result Value Ref Range   Lactic Acid, Venous 2.3 (HH) 0.5 - 1.9 mmol/L    Comment: CRITICAL RESULT CALLED TO, READ BACK BY AND VERIFIED WITH JENNIFER [facility] 10/20/2020 AT 1052 BY ACR Performed at [facility], [address] [place], [place] [address]   Type and screen     Status: None   Collection Time: 10/31/2020 10:53 AM  Result Value Ref Range   ABO/RH(D) B POS    Antibody Screen NEG    Sample Expiration      11/04/2020,2359 Performed at [facility], [address] [place], [place] [address]   CBG monitoring, ED     Status: Abnormal   Collection Time: 10/20/2020 11:54 AM  Result Value Ref Range   Glucose-Capillary 145 (H) 70 - 99 mg/dL    Comment: Glucose reference range applies only to samples taken after fasting for at least 8 hours.   Comment 1 Notify RN   Blood gas, venous     Status: Abnormal   Collection Time: 10/21/2020 12:25 PM  Result Value Ref Range   pH, Ven 7.20 (L) 7.250 - 7.430   pCO2, Ven 35 (L) 44.0 - 60.0 mmHg   pO2, Ven 58.0 (H) 32.0 - 45.0 mmHg   Bicarbonate 13.7 (L) 20.0 - 28.0 mmol/L   Acid-base deficit 13.4 (H) 0.0 - 2.0 mmol/L   O2  Saturation 82.3 %   Patient temperature 37.0    Sample type VENOUS     Comment: Performed at [facility], [address], [place], [place] [address]    DG Chest Portable 1 View  Result Date: 10/24/2020 CLINICAL DATA:  Seizure, ESRD, missed dialysis. EXAM: PORTABLE CHEST 1 VIEW COMPARISON:  Chest x-ray 09/28/2020, CT chest 07/07/2020 FINDINGS: Right chest wall dialysis catheter with tip overlying the right atrium. The heart size and mediastinal contours are unchanged. Vague oval 2.4 cm density overlying the left mid lung zone likely external to the patient. No focal consolidation. No  pulmonary edema. No pleural effusion. No pneumothorax. No acute osseous abnormality. IMPRESSION: 1. No active disease. 2. Vague oval 2.4 cm density overlying the left mid lung zone likely external to the patient. Electronically Signed   By: Iven Finn M.D.   On: 10/14/2020 06:17    ROS: Unable to obtain review of systems due to patient being intubated mechanically ventilated and due to the acuity of her situation.  VS: Blood pressure (!) 226/146, pulse (!) 115, temperature 97.9 F (36.6 C), temperature source Axillary, resp. rate 20, height '5\' 2"'$  (1.575 m), weight 45.4 kg, SpO2 100 %. Physical Exam  Physical examination is limited due to need for PPE/CAPR GENERAL: Thin woman, intubated mechanically ventilated unresponsive.  Synchronous with the ventilator HEAD: Normocephalic, atraumatic.  EYES: Pupils equal, round, pinpoint.  No scleral icterus.  Mild scleral edema. MOUTH: Orotracheally intubated, OG in place.  Oral mucosa dry. NECK: Supple. No thyromegaly. Trachea midline. No JVD.  No adenopathy. PULMONARY: Good air entry bilaterally.  No adventitious sounds.  No ventilator asynchrony. CARDIOVASCULAR: Monitor shows tachycardic rate and regular rhythm.  ABDOMEN: Nondistended, soft, no hepatosplenomegaly.  Difficult to assess bowel sounds due to PPE (CAPR). MUSCULOSKELETAL: No joint deformity, no  clubbing, no edema.  Decreased muscle mass. NEUROLOGIC: Obtunded, pupils pinpoint, not following commands.  GSW 3 SKIN: Intact,warm,dry.  No overt rashes noted. PSYCH: Cannot make assessment patient obtunded.   X-ray post extubation: Independently reviewed, shows clear lung fields, support lines and tubes in good position.   Point-of-care ultrasound: Spot echocardiogram performed at bedside, heart appears hyperdynamic, appears to have a small left ventricular cavity and significant LV hypertrophy.  Further imaging limited due to significant tachycardia and leads/defib pads in place.  Initial   Assessment/Plan:  Synopsis: 40 year old woman with known lupus and lupus nephritis, ESRD, COVID-19 infection presented with seizures and now required intubation for airway protection.  Initial presentation was that of a hypertensive emergency now with compromised airway and intubated and mechanically ventilated.  She has COVID-19 infection but does not appear to have COVID pneumonia.  Query recurrent PRES, lupus cerebritis, COVID-19 encephalitis versus stroke.  Acute respiratory failure with hypoxia Intubated for airway protection Altered mental status in the setting of seizures Continue supportive care, ventilator support Arterial blood gases Ventilator associated pneumonia protocol Sedation as needed for ventilator comfort Best practices as per protocol  Acute encephalopathy History of seizures in the setting of SLE DDx: Prolonged post ictal state, PRES, lupus cerebritis, COVID-19 encephalitis,stroke, etc. Head CT reportedly normal Obtain MRI EEG Loaded with Keppra and continue dosing Neuro consult when above obtained LP on hold due to thrombocytopenia Empiric antibiotic coverage  COVID-19 infection No evidence of pneumonia Supportive care Monitor inflammatory markers Patient has been symptomatic over a week On steroids  Hyperdynamic left ventricle Small LV cavity Appears volume  depleted Volume challenge Formal echo  Hypertensive emergency Subsequent hypotension Patient now with labile blood pressure Supportive care Volume challenge Pressors if needed to maintain MAP over 65 Check cortisol Hydrocortisone  SLE  This issue adds complexity to her management Supportive care Currently on steroids may need "pulse steroids" Await data did  Leukocytosis Panculture No evidence of pulmonary infiltrate Empiric vancomycin/ceftriaxone  ESRD Dialysis without ultrafiltration Appreciate nephrology's input  Rectal bleeding GI following May be due to acute illness Reviewed Dr. Drinda Butts consultation note Appreciate input  Thrombocytopenia This issue has come complexity to her management Believed to be secondary to SLE Likely worsened by acute illness   Best practice: GI prophylaxis: Protonix  every 12 H DVT prophylaxis: Mechanical VAP protocol: In place  Updated patient's mother Franca Damour via phone.  Total critical care time 60 minutes.    Renold Don, MD [facility] 10/22/2020, 12:49 PM    *This note was dictated using voice recognition software/Dragon.  Despite best efforts to proofread, errors can occur which can change the meaning.  Any change was purely unintentional.

## 2020-11-01 NOTE — Consult Note (Signed)
Pharmacy Antibiotic Note  Patricia Maldonado is a 40 y.o. female admitted on 10/10/2020 with acute altered mental status and concern for possible seizures and acute respiratory distress requiring emergency intubation and HD in ED.  Pharmacy has been consulted for Vancomycin and Ceftriaxone dosing for empiric meningitis. Patient with a history of ESRD on HD 2/2 lupus nephritis. Per notes, patient has missed last several HD sessions PTA. Received emergency HD on 10/29/2020 in ED. Per chart review-- appears patient is typically on M-W-F schedule.  Plan:  Ceftriaxone 2 gram IV Q12H  Vancomycin 1 gram IV Loading Dose   Vancomycin 500 mg x 1 dosing following next HD session  Follow up with dialysis schedule to determine when to give next post-HD dose and obtain pre-HD vancomycin level -Target level 15-25 mcg/mL     Height: '5\' 2"'$  (157.5 cm) Weight: 45.4 kg (100 lb) IBW/kg (Calculated) : 50.1  Temp (24hrs), Avg:95.7 F (35.4 C), Min:93.6 F (34.2 C), Max:97.7 F (36.5 C)  Recent Labs  Lab 10/16/2020 0459 10/31/2020 0838 10/03/2020 1027  WBC 14.9* 18.5*  --   CREATININE 22.78*  --   --   LATICACIDVEN  --   --  2.3*    Estimated Creatinine Clearance: 2.4 mL/min (A) (by C-G formula based on SCr of 22.78 mg/dL (H)).    Allergies  Allergen Reactions  . Penicillins Swelling    Yeast infection Has patient had a PCN reaction causing immediate rash, facial/tongue/throat swelling, SOB or lightheadedness with hypotension:No Has patient had a PCN reaction causing severe rash involving mucus membranes or skin necrosis: Yes Has patient had a PCN reaction that required hospitalization No Has patient had a PCN reaction occurring within the last 10 years: No If all of the above answers are "NO", then may proceed with Cephalosporin use.  Yeast infection Has patient had a PCN reaction causing immediate rash, facial/tongue/throat swelling, SOB or lightheadedness with hypotension:No Has patient had a PCN  reaction causing severe rash involving mucus membranes or skin necrosis: Yes Has patient had a PCN reaction that required hospitalization No Has patient had a PCN reaction occurring within the last 10 years: No If all of the above answers are "NO", then may proceed with Cephalosporin use.    Antimicrobials this admission: Ceftriaxone >> 1/30 > Vancomycin >> 1/30 >   Dose adjustments this admission: n/a  Microbiology results: 1/30 BCx: pending 1/30 UCx: pending    Thank you for allowing pharmacy to be a part of this patient's care.  Dorothe Pea, PharmD, BCPS Clinical Pharmacist  10/27/2020 3:38 PM

## 2020-11-01 NOTE — H&P (Addendum)
History and Physical    Patricia Maldonado [id number] DOB: January 04, 1981 DOA: 10/19/2020  Referring MD/NP/PA:   PCP: Letta Median, MD   Patient coming from:  The patient is coming from home.  At baseline, pt is independent for most of ADL.        Chief Complaint: seizure  HPI: Patricia Maldonado is a 40 y.o. female with medical history significant of seizure, hypertension, lupus nephritis, kidney stone, anemia, ESRD-HD (MWF), PE not on anticoagulants, tobacco abuse, PRES, thrombocytopenia, who presents with seizure.  Patient has AMS, and is unable to provide any medical history. I have tried to called her two sisters, one of them was not living with pt and did not know what happened to pt, and another sister did not pick up the phone, I left message to her. I also called her mother. Her mother has provided limited information since she is not living with patient.  The history is limited.  Per her mother, patient had hx of ESRD-HD (MWF), she did not go for dialysis for about 1 week.  She developed seizure at home today.  Per EMS report, patient had seizure and hasn't had dialysis in a week because she has possible COVID. Per EDP's note, "she reportedly has not been taking her antiepileptic medications (Keppra)". Currently, pt is confused, restless and combative in ED. She is not arousable, not following command.  She moves all extremities.  No facial droop noted.  No active cough, respiratory distress noted.  No active nausea, vomiting noted.  Per nurse report, patient had rectal bleeding with loose stool.  I did rectal examination and found that pt has little loose bloody stool which was positive for FOBT.   Patient was found to have elevated blood pressure 212/123, which improved her to 165/129 after giving 20 mg of IV labetalol in ED.  ED Course: pt was found to have positive Covid PCR, positive FOBT, WBC 14.9, hemoglobin 10.6 (12.7 on 09/26/2020), platelet of 107, potassium 5.1, bicarbonate  of 14, creatinine 22.78, BUN 97, temperature normal.  Chest x-ray is negative for infiltration.  Patient is admitted to [facility] as inpatient.  Dr. Theador Hawthorne of nephrology and Dr. Haig Prophet of GI are consulted.  Review of Systems: Could not be reviewed due to altered mental status.  Allergy:  Allergies  Allergen Reactions  . Penicillins Swelling    Yeast infection Has patient had a PCN reaction causing immediate rash, facial/tongue/throat swelling, SOB or lightheadedness with hypotension:No Has patient had a PCN reaction causing severe rash involving mucus membranes or skin necrosis: Yes Has patient had a PCN reaction that required hospitalization No Has patient had a PCN reaction occurring within the last 10 years: No If all of the above answers are "NO", then may proceed with Cephalosporin use.  Yeast infection Has patient had a PCN reaction causing immediate rash, facial/tongue/throat swelling, SOB or lightheadedness with hypotension:No Has patient had a PCN reaction causing severe rash involving mucus membranes or skin necrosis: Yes Has patient had a PCN reaction that required hospitalization No Has patient had a PCN reaction occurring within the last 10 years: No If all of the above answers are "NO", then may proceed with Cephalosporin use.    Past Medical History:  Diagnosis Date  . Acute renal failure (ARF) ([place])   . Anemia   . Arthritis   . History of kidney stones   . Hypertension    OFF BP MEDS X 3 WEEKS DUE TO BP CONTROL  .  Lupus nephritis ([place]) 12/2016   DX DURING HOSPITAL ADMISSION  . Rash   . Staghorn calculus   . 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[place]) 10/2016   DX DURING HOSPITAL ADMISSION    Past Surgical History:  Procedure Laterality Date  . CYSTOSCOPY/URETEROSCOPY/HOLMIUM LASER/STENT PLACEMENT Right 02/21/2017   Procedure: CYSTOSCOPY/URETEROSCOPY/HOLMIUM LASER/STENT EXCHANGE;  Surgeon: [name], MD;  Location: [facility] OR[facility];  Service: Urology;  Laterality: Right;  .  DIALYSIS/PERMA CATHETER INSERTION N/A 11/30/2018   Procedure: DIALYSIS/PERMA CATHETER INSERTION;  Surgeon: Algernon Huxley, MD;  Location: [facility];  Service: Cardiovascular;  Laterality: N/A;  . DIALYSIS/PERMA CATHETER INSERTION N/A 07/07/2020   Procedure: DIALYSIS/PERMA CATHETER INSERTION;  Surgeon: Katha Cabal, MD;  Location: [facility];  Service: Cardiovascular;  Laterality: N/A;  . IR NEPHROSTOMY PLACEMENT RIGHT  01/23/2017  . NEPHROLITHOTOMY Right 01/24/2017   Procedure: NEPHROLITHOTOMY PERCUTANEOUS;  Surgeon: [name], MD;  Location: [facility];  Service: Urology;  Laterality: Right;  . PERITONEAL CATHETER INSERTION    . TUBAL LIGATION      Social History:  reports that she has been smoking cigarettes. She has a 3.75 pack-year smoking history. She has never used smokeless tobacco. She reports current drug use. Drug: Marijuana. She reports that she does not drink alcohol.  Family History:  Family History  Problem Relation Age of Onset  . Thyroid disease Mother   . Thyroid disease Father   . Diabetes Father      Prior to Admission medications   Medication Sig Start Date End Date Taking? Authorizing Provider  B Complex-C-Zn-Folic Acid (DIALYVITE/ZINC) TABS Take 1 tablet by mouth at bedtime. 05/27/20   [provider]  calcitRIOL (ROCALTROL) 0.5 MCG capsule 2 Capsule(s) By Mouth Daily    [provider]  carvedilol (COREG) 12.5 MG tablet Take 3 tablets (37.5 mg total) by mouth 2 (two) times daily with a meal. tablet Take 3 tablets (37.5 mg total) by mouth two (2) times a day 09/26/20 10/26/20  Max Sane, MD  cinacalcet (SENSIPAR) 30 MG tablet Take 30 mg by mouth daily.    [provider]  citalopram (CELEXA) 10 MG tablet Take 10 mg by mouth daily.    [provider]  hydroxychloroquine (PLAQUENIL) 200 MG tablet  04/25/20   [provider]  hydroxyurea (DROXIA) 200 MG capsule Take 200 mg by mouth daily.    [provider]  levETIRAcetam (KEPPRA) 500 MG tablet TAKE 1 TABLET BY MOUTH TWICE DAILY WITH ADDITIONAL 1 TABLET AFTER DIALYSIS    [provider]  mycophenolate (CELLCEPT) 500 MG tablet Take 2 tablets (1,000 mg total) by mouth 2 (two) times daily. 04/30/18   Hillary Bow, MD  NIFEdipine (PROCARDIA XL/NIFEDICAL-XL) 90 MG 24 hr tablet Take 1 tablet (90 mg total) by mouth daily. 09/26/20 10/26/20  Max Sane, MD  ondansetron (ZOFRAN-ODT) 4 MG disintegrating tablet Take 4 mg by mouth every 6 (six) hours as needed for nausea/vomiting. 09/09/20   [provider]  prochlorperazine (COMPAZINE) 10 MG tablet Take 1 tablet (10 mg total) by mouth every 6 (six) hours as needed for up to 15 doses (30 Min prior to dialysis.). 05/10/20   Para Skeans, MD  sevelamer carbonate (RENVELA) 800 MG tablet Take 1,600 mg by mouth 3 (three) times daily. 07/24/20   [provider]  valsartan (DIOVAN) 160 MG tablet Take 1 tablet (160 mg total) by mouth daily. 09/26/20 10/26/20  Max Sane, MD    Physical Exam: Vitals:   10/13/2020  0507 10/24/2020 0527 10/29/2020 0530 10/06/2020 0700  BP:  (!) 193/123 (!) 209/125 (!) 165/129  Pulse: 81 99 93 92  Resp: (!) 21 (!) '24 18 20  '$ Temp: 97.7 F (36.5 C)     TempSrc: Axillary     SpO2: 98% 98% 98% 97%  Weight:      Height:       General: Not in acute distress HEENT:       Eyes: PERRL, EOMI, no scleral icterus.       ENT: No discharge from the ears and nose.       Neck: No JVD, no bruit, no mass felt. Heme: No neck lymph node enlargement. Cardiac: S1/S2, RRR, No murmurs, No gallops or rubs. Respiratory: No rales, wheezing, rhonchi or rubs. GI: Soft, nondistended, nontender, no organomegaly, BS present. GU: No hematuria Ext: No pitting leg edema bilaterally. 1+DP/PT pulse bilaterally. Musculoskeletal: No joint deformities, No joint redness or warmth, no limitation of ROM in spin. Skin: No rashes.  Neuro: pt is in comatose, not following command, not  arousable, restless, combative, cranial nerves II-XII grossly intact, moves all extremities.  Psych: Patient is not psychotic, no suicidal or hemocidal ideation.  Labs on Admission: I have personally reviewed following labs and imaging studies  CBC: Recent Labs  Lab 10/18/2020 0459  WBC 14.9*  NEUTROABS 13.4*  HGB 10.6*  HCT 33.7*  MCV 91.3  PLT [id number]*   Basic Metabolic Panel: Recent Labs  Lab 10/24/2020 0459  NA 143  K 5.1  CL 106  CO2 14*  GLUCOSE 157*  BUN 97*  CREATININE 22.78*  CALCIUM 10.3  MG 2.6*   GFR: Estimated Creatinine Clearance: 2.4 mL/min (A) (by C-G formula based on SCr of 22.78 mg/dL (H)). Liver Function Tests: Recent Labs  Lab 10/03/2020 0459  AST 20  ALT 6  ALKPHOS 58  BILITOT 0.8  PROT 8.3*  ALBUMIN 4.1   No results for input(s): LIPASE, AMYLASE in the last 168 hours. No results for input(s): AMMONIA in the last 168 hours. Coagulation Profile: No results for input(s): INR, PROTIME in the last 168 hours. Cardiac Enzymes: No results for input(s): CKTOTAL, CKMB, CKMBINDEX, TROPONINI in the last 168 hours. BNP (last 3 results) No results for input(s): PROBNP in the last 8760 hours. HbA1C: No results for input(s): HGBA1C in the last 72 hours. CBG: No results for input(s): GLUCAP in the last 168 hours. Lipid Profile: No results for input(s): CHOL, HDL, LDLCALC, TRIG, CHOLHDL, LDLDIRECT in the last 72 hours. Thyroid Function Tests: No results for input(s): TSH, T4TOTAL, FREET4, T3FREE, THYROIDAB in the last 72 hours. Anemia Panel: No results for input(s): VITAMINB12, FOLATE, FERRITIN, TIBC, IRON, RETICCTPCT in the last 72 hours. Urine analysis:    Component Value Date/Time   COLORURINE YELLOW (A) 07/04/2020 1908   APPEARANCEUR CLOUDY (A) 07/04/2020 1908   APPEARANCEUR Cloudy 09/16/2014 0814   LABSPEC 1.013 07/04/2020 1908   LABSPEC 1.016 09/16/2014 0814   PHURINE 6.0 07/04/2020 1908   GLUCOSEU NEGATIVE 07/04/2020 1908   GLUCOSEU Negative  09/16/2014 0814   HGBUR SMALL (A) 07/04/2020 1908   BILIRUBINUR NEGATIVE 07/04/2020 1908   BILIRUBINUR Negative 09/16/2014 [place] 07/04/2020 1908   PROTEINUR >=300 (A) 07/04/2020 1908   NITRITE NEGATIVE 07/04/2020 1908   LEUKOCYTESUR LARGE (A) 07/04/2020 1908   LEUKOCYTESUR 3+ 09/16/2014 0814   Sepsis Labs: '@LABRCNTIP'$ (procalcitonin:4,lacticidven:4) ) Recent Results (from the past 240 hour(s))  SARS Coronavirus 2 by RT PCR (hospital order, performed in [facility] hospital lab) Nasopharyngeal Nasopharyngeal Swab     Status: Abnormal   Collection Time: 10/20/2020  4:59 AM   Specimen: Nasopharyngeal Swab  Result Value Ref Range Status   SARS Coronavirus 2 POSITIVE (A) NEGATIVE Final    Comment: RESULT CALLED TO, READ BACK BY AND VERIFIED WITH:  DAWN TULLOCH AT 0600 10/10/2020 SDR (NOTE) SARS-CoV-2 target nucleic acids are DETECTED  SARS-CoV-2 RNA is generally detectable in upper respiratory specimens  during the acute phase of infection.  Positive results are indicative  of the presence of the identified virus, but do not rule out bacterial infection or co-infection with other pathogens not detected by the test.  Clinical correlation with patient history and  other diagnostic information is necessary to determine patient infection status.  The expected result is negative.  Fact Sheet for Patients:   StrictlyIdeas.no   Fact Sheet for Healthcare Providers:   BankingDealers.co.za    This test is not yet approved or cleared by the [place] FDA and  has been authorized for detection and/or diagnosis of SARS-CoV-2 by FDA under an Emergency Use Authorization (EUA).  This EUA will remain in effect (meaning this tes t can be used) for the duration of  the COVID-19 declaration under Section 564(b)(1) of the Act, 21 U.S.C. section 360-bbb-3(b)(1), unless the authorization is terminated or revoked sooner.  Performed at  [facility], [address], [place], [place] [address]      Radiological Exams on Admission: DG Chest Portable 1 View  Result Date: 10/08/2020 CLINICAL DATA:  Seizure, ESRD, missed dialysis. EXAM: PORTABLE CHEST 1 VIEW COMPARISON:  Chest x-ray 09/28/2020, CT chest 07/07/2020 FINDINGS: Right chest wall dialysis catheter with tip overlying the right atrium. The heart size and mediastinal contours are unchanged. Vague oval 2.4 cm density overlying the left mid lung zone likely external to the patient. No focal consolidation. No pulmonary edema. No pleural effusion. No pneumothorax. No acute osseous abnormality. IMPRESSION: 1. No active disease. 2. Vague oval 2.4 cm density overlying the left mid lung zone likely external to the patient. Electronically Signed   By: Iven Finn M.D.   On: 10/21/2020 06:17     EKG: I have personally reviewed.  Sinus rhythm, QTC 434, nonspecific T wave change   Assessment/Plan Principal Problem:   Hypertensive emergency Active Problems:   Lupus nephritis ([place])   Systemic lupus erythematosus (HCC)   ESRD on hemodialysis (HCC)   Essential hypertension   Anemia in ESRD (end-stage renal disease) (HCC)   Thrombocytopenia (HCC)   Seizures (HCC)   Tobacco abuse   COVID-19 virus infection   Leukocytosis   Acute metabolic encephalopathy   Rectal bleeding   Hypertensive emergency and hx of essential hypertension: Bp 212/123 -->improved to 165/129 after giving 20 mg of labetalol.   -will admit to med-surg bed for obs -prn IV hydralazine -Continue home oral medications when able to: Labetalol, nicardipine, Diovan -Hydralazine IV 10 mg every 8 hour, with holding parameter of SBP<165 - the of goal of bp reduction is by about 25 to 30% in first several hours, at SBP 160 to 180 mmHg.  Seizure: Possibly due to medication noncompliance to Keppra.  Patient has multiple ongoing acute issues, which may have lowered the threshold of seizure. -Patient was  loaded with 1000 mg of Keppra by IV in ED -We will continue Keppra 500 mg twice daily -Frequent neuro check  Acute metabolic encephalopathy and comatose: Likely multifactorial etiology including hypertensive emergency, postictal status after seizure, uremia  and COVID-19 infection.  Another potential differential diagnosis is PRES. -Frequent neuro check -f/u CT-head -hold oral meds now until mental status improves  Addendum: pt has persistent hypertensive emergency, mental status has not been worsening, cannot protect airway.  Patient is intubated by Dr. Tamala Julian in ED. I called and consulted Dr. Patsey Berthold of ICU.  Patient will be transferred to ICU. I called her mother and sister, and gave this update to them.    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 -Dr. Theador Hawthorne of renal is consulted  Rectal bleeding: HGb 12.7 -->10.6. Dr. Haig Prophet for GI is consulted - Start IV pantoprazole 40 mg bid - Zofran IV for nausea - Avoid NSAIDs and SQ heparin - Maintain IV access (2 large bore IVs if possible). - Monitor closely and follow q6h cbc, transfuse as necessary, if Hgb<7.0 - LaB: INR, PTT and type screen  Anemia in ESRD (end-stage renal disease): -f/u CBC as above  Thrombocytopenia ([place]): This is chronic issue.  Etiology is not clear.  May be related to lupus -Follow-up by CBC  Tobacco abuse -nicotine patch  COVID-19 virus infection: Patient has a portable Covid PCR, but no oxygen desaturation.  Chest x-ray is negative for infiltration.  No treatment needed. -prn albuterol inhaler and prn mucinex  Leukocytosis: WBC 14.9, no fever.  No source of infection identified. -Follow-up urinalysis and blood culture  Depression: -Celexa when able to take  Abnormal CXR finding: CXR showed a vague oval 2.4 cm density overlying the left mid lung  zone likely external to the patient. -f/u with PCP for repeating CXR in clinic          DVT ppx: SCD Code Status: Full code Family Communication: I called her sister and mother Disposition Plan:  Anticipate discharge back to previous environment Consults called:  Dr. Theador Hawthorne of nephrology and Dr. Haig Prophet of GI are consulted. Dr. Patsey Berthold of ICU Admission status and Level of care: Med-Surg:   as inpt        Status is: Inpatient  Remains inpatient appropriate because:Inpatient level of care appropriate due to severity of illness   Dispo: The patient is from: Home              Anticipated d/c is to: Home              Anticipated d/c date is: 2 days              Patient currently is not medically stable to d/c.   Difficult to place patient No          Date of Service 10/29/2020    [facility]   If 7PM-7AM, please contact night-coverage [email or web address] 10/12/2020, 9:11 AM

## 2020-11-01 NOTE — Consult Note (Signed)
Consultation  Referring Provider:   Hospitalist Admit date: 1/30 Consult date: 1/30         Reason for Consultation:  Rectal Bleeding         HPI:    [name] is a 40 y/o lady with history of lupus nephritis requiring dialysis, seizure disorder, thrombocytopenia of unknown cause but suspect lupus, COVID-19 infection and other medical issues who presented here with altered mental status having missed several dialysis sessions. Mention of rectal bleeding for which we were consulted and upon evaluation she was emergently intubated for seizure and unresponsiveness. Was not able to evaluate her.  Past Medical History:  Diagnosis Date  . Acute renal failure (ARF) ([place])   . Anemia   . Arthritis   . History of kidney stones   . Hypertension    OFF BP MEDS X 3 WEEKS DUE TO BP CONTROL  . Lupus nephritis ([place]) 12/2016   DX DURING HOSPITAL ADMISSION  . Rash   . Staghorn calculus   . 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 CYSTOSCOPY/URETEROSCOPY/HOLMIUM LASER/STENT PLACEMENT Right 02/21/2017   Procedure: CYSTOSCOPY/URETEROSCOPY/HOLMIUM LASER/STENT EXCHANGE;  Surgeon: [name], MD;  Location: [facility];  Service: Urology;  Laterality: Right;  . DIALYSIS/PERMA CATHETER INSERTION N/A 11/30/2018   Procedure: DIALYSIS/PERMA CATHETER INSERTION;  Surgeon: Algernon Huxley, MD;  Location: [place] [facility];  Service: Cardiovascular;  Laterality: N/A;  . DIALYSIS/PERMA CATHETER INSERTION N/A 07/07/2020   Procedure: DIALYSIS/PERMA CATHETER INSERTION;  Surgeon: Katha Cabal, MD;  Location: [place] [facility];  Service: Cardiovascular;  Laterality: N/A;  . IR NEPHROSTOMY PLACEMENT RIGHT  01/23/2017  . NEPHROLITHOTOMY Right 01/24/2017   Procedure: NEPHROLITHOTOMY PERCUTANEOUS;  Surgeon: [name], MD;  Location: [facility];  Service: Urology;  Laterality: Right;  . PERITONEAL CATHETER INSERTION    . TUBAL LIGATION       Family History  Problem Relation Age of Onset  . Thyroid disease Mother   . Thyroid disease Father   . Diabetes Father     Social History   Tobacco Use  . Smoking status: Current Every Day Smoker    Packs/day: 0.25    Years: 15.00    Pack years: 3.75    Types: Cigarettes  . Smokeless tobacco: Never Used  Vaping Use  . Vaping Use: Never used  Substance Use Topics  . Alcohol use: No  . Drug use: Yes    Types: Marijuana    Comment: OCC    Prior to Admission medications   Medication Sig Start Date End Date Taking? Authorizing Provider  B Complex-C-Zn-Folic Acid (DIALYVITE/ZINC) TABS Take 1 tablet by mouth at bedtime. 05/27/20   [provider]  calcitRIOL (ROCALTROL) 0.5 MCG capsule 2 Capsule(s) By Mouth Daily    [provider]  carvedilol (COREG) 12.5 MG tablet Take 3 tablets (37.5 mg total) by mouth 2 (two) times daily with a meal. tablet Take 3 tablets (37.5 mg total) by mouth two (2) times a day 09/26/20 10/26/20  Max Sane, MD  cinacalcet (SENSIPAR) 30 MG tablet Take 30 mg by mouth daily.    [provider]  citalopram (CELEXA) 10 MG tablet Take 10 mg by mouth daily.    [provider]  hydroxychloroquine (PLAQUENIL) 200 MG tablet  04/25/20   [provider]  hydroxyurea (DROXIA) 200 MG capsule Take 200 mg by mouth daily.    [provider]  levETIRAcetam (KEPPRA) 500 MG tablet TAKE 1 TABLET BY MOUTH TWICE DAILY WITH ADDITIONAL 1 TABLET AFTER DIALYSIS    [provider]  mycophenolate (CELLCEPT) 500 MG tablet Take 2 tablets (1,000 mg total) by mouth 2 (two) times daily. 04/30/18   Hillary Bow, MD  NIFEdipine (PROCARDIA XL/NIFEDICAL-XL) 90 MG 24 hr tablet Take 1 tablet (90 mg total) by mouth daily. 09/26/20 10/26/20  Max Sane, MD  ondansetron (ZOFRAN-ODT) 4 MG disintegrating tablet Take 4 mg by mouth every 6 (six) hours as needed for nausea/vomiting. 09/09/20   [provider]  prochlorperazine  (COMPAZINE) 10 MG tablet Take 1 tablet (10 mg total) by mouth every 6 (six) hours as needed for up to 15 doses (30 Min prior to dialysis.). 05/10/20   Para Skeans, MD  sevelamer carbonate (RENVELA) 800 MG tablet Take 1,600 mg by mouth 3 (three) times daily. 07/24/20   [provider]  valsartan (DIOVAN) 160 MG tablet Take 1 tablet (160 mg total) by mouth daily. 09/26/20 10/26/20  Max Sane, MD    Current Facility-Administered Medications  Medication Dose Route Frequency Provider Last Rate Last Admin  . 0.9 %  sodium chloride infusion  100 mL Intravenous PRN Bhutani, Manpreet S, MD      . 0.9 %  sodium chloride infusion  100 mL Intravenous PRN Bhutani, Manpreet S, MD      . acetaminophen (TYLENOL) suppository 650 mg  650 mg Rectal Q6H PRN Ivor Costa, MD      . acetaminophen (TYLENOL) tablet 650 mg  650 mg Oral Q6H PRN Ivor Costa, MD      . albuterol (VENTOLIN HFA) 108 (90 Base) MCG/ACT inhaler 2 puff  2 puff Inhalation Q4H PRN Ivor Costa, MD      . alteplase (CATHFLO ACTIVASE) injection 2 mg  2 mg Intracatheter Once PRN Liana Gerold, MD      . Chlorhexidine Gluconate Cloth 2 % PADS 6 each  6 each Topical Q0600 Bhutani, Manpreet S, MD      . dextromethorphan-guaiFENesin (Merrill DM) 30-600 MG per 12 hr tablet 1 tablet  1 tablet Oral BID PRN Ivor Costa, MD      . epoetin alfa (EPOGEN) injection 2,000 Units  2,000 Units Subcutaneous Q T,Th,Sa-HD Bhutani, Manpreet S, MD      . heparin injection 1,000 Units  1,000 Units Dialysis PRN Theador Hawthorne, Manpreet S, MD      . heparin injection 900 Units  20 Units/kg Dialysis PRN Theador Hawthorne, Manpreet S, MD      . hydrALAZINE (APRESOLINE) injection 10 mg  10 mg Intravenous Q8H Ivor Costa, MD      . hydrALAZINE (APRESOLINE) injection 5 mg  5 mg Intravenous Q2H PRN Ivor Costa, MD      . labetalol (NORMODYNE) injection 20 mg  20 mg Intravenous Once Ivor Costa, MD      . levETIRAcetam (KEPPRA) IVPB 500 mg/100 mL premix  500 mg Intravenous Q12H Ivor Costa,  MD      . lidocaine (PF) (XYLOCAINE) 1 % injection 5 mL  5 mL Intradermal PRN Bhutani, Manpreet S, MD      . lidocaine-prilocaine (EMLA) cream 1 application  1 application Topical PRN Bhutani, Manpreet S, MD      . LORazepam (ATIVAN) injection 2 mg  2 mg Intravenous Q2H PRN Ivor Costa, MD   2 mg at 10/08/2020 [id number]  . nicotine (NICODERM CQ - dosed in mg/24 hours) patch 21 mg  21 mg Transdermal Daily Ivor Costa, MD      .  ondansetron (ZOFRAN) injection 4 mg  4 mg Intravenous Q8H PRN Ivor Costa, MD      . pantoprazole (PROTONIX) injection 40 mg  40 mg Intravenous Q12H Ivor Costa, MD   40 mg at 10/25/2020 1039  . pentafluoroprop-tetrafluoroeth (GEBAUERS) aerosol 1 application  1 application Topical PRN Bhutani, Manpreet S, MD      . propofol (DIPRIVAN) 1000 MG/100ML infusion  5-80 mcg/kg/min Intravenous Continuous Lucrezia Starch, MD 8.17 mL/hr at 10/06/2020 1222 30 mcg/kg/min at 10/04/2020 1222   Current Outpatient Medications  Medication Sig Dispense Refill  . B Complex-C-Zn-Folic Acid (DIALYVITE/ZINC) TABS Take 1 tablet by mouth at bedtime.    . calcitRIOL (ROCALTROL) 0.5 MCG capsule 2 Capsule(s) By Mouth Daily    . carvedilol (COREG) 12.5 MG tablet Take 3 tablets (37.5 mg total) by mouth 2 (two) times daily with a meal. tablet Take 3 tablets (37.5 mg total) by mouth two (2) times a day 180 tablet 1  . cinacalcet (SENSIPAR) 30 MG tablet Take 30 mg by mouth daily.    . citalopram (CELEXA) 10 MG tablet Take 10 mg by mouth daily.    . hydroxychloroquine (PLAQUENIL) 200 MG tablet     . hydroxyurea (DROXIA) 200 MG capsule Take 200 mg by mouth daily.    Marland Kitchen levETIRAcetam (KEPPRA) 500 MG tablet TAKE 1 TABLET BY MOUTH TWICE DAILY WITH ADDITIONAL 1 TABLET AFTER DIALYSIS    . mycophenolate (CELLCEPT) 500 MG tablet Take 2 tablets (1,000 mg total) by mouth 2 (two) times daily. 120 tablet 0  . NIFEdipine (PROCARDIA XL/NIFEDICAL-XL) 90 MG 24 hr tablet Take 1 tablet (90 mg total) by mouth daily. 30 tablet 0  .  ondansetron (ZOFRAN-ODT) 4 MG disintegrating tablet Take 4 mg by mouth every 6 (six) hours as needed for nausea/vomiting.    . prochlorperazine (COMPAZINE) 10 MG tablet Take 1 tablet (10 mg total) by mouth every 6 (six) hours as needed for up to 15 doses (30 Min prior to dialysis.). 15 tablet 0  . sevelamer carbonate (RENVELA) 800 MG tablet Take 1,600 mg by mouth 3 (three) times daily.    . valsartan (DIOVAN) 160 MG tablet Take 1 tablet (160 mg total) by mouth daily. 30 tablet 0    Allergies as of 10/12/2020 - Review Complete 10/08/2020  Allergen Reaction Noted  . Penicillins Swelling 03/20/2016     Review of Systems:    All systems reviewed and negative except where noted in HPI.  Unable to assess due to mental status   Physical Exam:  Vital signs in last 24 hours: Temp:  [97.7 F (36.5 C)-97.9 F (36.6 C)] 97.9 F (36.6 C) (01/30 1120) Pulse Rate:  [75-115] 115 (01/30 1030) Resp:  [18-24] 20 (01/30 0700) BP: (165-226)/(123-146) 226/146 (01/30 1120) SpO2:  [97 %-100 %] 100 % (01/30 1217) FiO2 (%):  [30 %] 30 % (01/30 1217) Weight:  [45.4 kg] 45.4 kg (01/30 0504)    Unable to perform physical exam due to being emergently intubated  LAB RESULTS: Recent Labs    10/04/2020 0459 10/17/2020 0838  WBC 14.9* 18.5*  HGB 10.6* 10.6*  HCT 33.7* 33.9*  PLT 107* 66*   BMET Recent Labs    10/17/2020 0459  NA 143  K 5.1  CL 106  CO2 14*  GLUCOSE 157*  BUN 97*  CREATININE 22.78*  CALCIUM 10.3   LFT Recent Labs    10/16/2020 0459  PROT 8.3*  ALBUMIN 4.1  AST 20  ALT 6  ALKPHOS 58  BILITOT 0.8   PT/INR Recent Labs    11/02/2020 1027  LABPROT 13.4  INR 1.1    STUDIES: DG Chest Portable 1 View  Result Date: 10/31/2020 CLINICAL DATA:  Seizure, ESRD, missed dialysis. EXAM: PORTABLE CHEST 1 VIEW COMPARISON:  Chest x-ray 09/28/2020, CT chest 07/07/2020 FINDINGS: Right chest wall dialysis catheter with tip overlying the right atrium. The heart size and mediastinal contours  are unchanged. Vague oval 2.4 cm density overlying the left mid lung zone likely external to the patient. No focal consolidation. No pulmonary edema. No pleural effusion. No pneumothorax. No acute osseous abnormality. IMPRESSION: 1. No active disease. 2. Vague oval 2.4 cm density overlying the left mid lung zone likely external to the patient. Electronically Signed   By: Iven Finn M.D.   On: 10/20/2020 06:17       Impression / Plan:   40 y/o with lupus nephritis, COVID 19, and seizure disorder who required intubation for altered mental status. Rectal bleeding noted by nursing staff. . Rectal bleeding and slight anemia in this patient could be caused by numerous etiologies such as COVID-19 gastroenteritis, uremic bleeding, hypertensive emergency, etc. Regardless, patient has numerous other issues that take precedent at the moment. Will follow along to assess status but nothing to do from a GI standpoint at the moment.  Raylene Miyamoto MD, MPH [place]

## 2020-11-01 NOTE — Progress Notes (Signed)
Called by radiologist to discuss MRI brain results.  MRI showing diffusely abnormal appearance of the brain with swelling and edema throughout both cerebral hemispheres and cerebellum.  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 Attempted to reach mother Festus Holts Eggebrecht to share MRI results, which she had requested when consented for central line placement procedure.  Ms. Hesson did not answer the phone and her voicemail was full so I was unable to leave a message.  We will continue to monitor closely.    [name], AGACNP-BC Acute Care Nurse Practitioner [facility]   [phone] / [phone] Please see Amion for pager details.

## 2020-11-01 NOTE — Progress Notes (Addendum)
Hemodialysis tx restarted per Dr. [place]. Pt is now sedated and intubated on the vent.

## 2020-11-01 NOTE — Progress Notes (Signed)
Pt arrived from ED via stretched, appeared pale, non-responsive.  Pulses palpable in the femoral site.  During transfer, pt noted to be severely hypotensive, systolic BP in the 0000000.  Phenylephrine resumed and titrated.  Pt also noted to be hypothermic, bair hugger applied.  While turning, pt remained non-responsive, bloody secretion noted in mouth.  Pt given one unit of platelets, DDVAP, and vasopressin started.  Pupils were 5, nonreactive, no corneal reflex noted, severe corneal edema noted bilaterally.  No gag reflex noted, no cough during deep suction.  MD updated on patient condition.

## 2020-11-01 NOTE — Progress Notes (Signed)
Patricia Maldonado  MRN: [id number]  DOB/AGE: [birth year] 38 y.o.  Primary Care Physician:Bender, Durene Cal, MD  Admit date: 10/19/2020  Chief Complaint:  Chief Complaint  Patient presents with  . Altered Mental Status    S-Pt presented on  10/08/2020 with  Chief Complaint  Patient presents with  . Altered Mental Status   Patient history has been taken from the medical records   Patient is a 40 year old African-American female with a past medical history of ESRD, seizure disorder, history of nonadherence to treatment, lupus nephritis, nephrolithiasis, SLE who was brought to the ER with chief complaint of altered mental status.  History of present illness dates back to this morning when EMS was called by her significant other after witnessing a seizure. He also gave a history that patient has not been to her dialysis for a week  Patient was seen in the ER. Patient remains obtunded Patient does not give any history  Medication   . nicotine  21 mg Transdermal Daily  . pantoprazole (PROTONIX) IV  40 mg Intravenous Q12H         ROS: Unable to get any data Physical Exam: Vital signs in last 24 hours: Temp:  [97.7 F (36.5 C)] 97.7 F (36.5 C) (01/30 0507) Pulse Rate:  [81-100] 92 (01/30 0700) Resp:  [18-24] 20 (01/30 0700) BP: (165-212)/(123-129) 165/129 (01/30 0700) SpO2:  [97 %-100 %] 97 % (01/30 0700) Weight:  [45.4 kg] 45.4 kg (01/30 0504) Weight change:     Intake/Output from previous day: 01/29 0701 - 01/30 0700 In: 100 [IV Piggyback:100] Out: -  No intake/output data recorded.   Physical Exam:  General- pt is lethargic,  Addendum HEENT ET tube in situ  Resp- No acute REsp distress,  [place]  CVS- S1S2 regular in rate and rhythm  GIT- BS+, soft, Non tender , Non distended  EXT- No LE Edema,  No Cyanosis  Access- tunneled cath   Lab Results:  CBC  Recent Labs    10/08/2020 0459 10/04/2020 0838  WBC 14.9* 18.5*  HGB 10.6* 10.6*  HCT  33.7* 33.9*  PLT 107* 66*    BMET  Recent Labs    10/31/2020 0459  NA 143  K 5.1  CL 106  CO2 14*  GLUCOSE 157*  BUN 97*  CREATININE 22.78*  CALCIUM 10.3      Most recent Creatinine trend  Lab Results  Component Value Date   CREATININE 22.78 (H) 10/12/2020   CREATININE 5.57 (H) 09/28/2020   CREATININE 13.99 (H) 09/26/2020      MICRO   Recent Results (from the past 240 hour(s))  SARS Coronavirus 2 by RT PCR (hospital order, performed in [facility] hospital lab) Nasopharyngeal Nasopharyngeal Swab     Status: Abnormal   Collection Time: 10/27/2020  4:59 AM   Specimen: Nasopharyngeal Swab  Result Value Ref Range Status   SARS Coronavirus 2 POSITIVE (A) NEGATIVE Final    Comment: RESULT CALLED TO, READ BACK BY AND VERIFIED WITH:  DAWN TULLOCH AT 0600 10/03/2020 SDR (NOTE) SARS-CoV-2 target nucleic acids are DETECTED  SARS-CoV-2 RNA is generally detectable in upper respiratory specimens  during the acute phase of infection.  Positive results are indicative  of the presence of the identified virus, but do not rule out bacterial infection or co-infection with other pathogens not detected by the test.  Clinical correlation with patient history and  other diagnostic information is necessary to determine patient infection status.  The expected result is negative.  Fact Sheet for Patients:   StrictlyIdeas.no   Fact Sheet for Healthcare Providers:   BankingDealers.co.za    This test is not yet approved or cleared by the [place] FDA and  has been authorized for detection and/or diagnosis of SARS-CoV-2 by FDA under an Emergency Use Authorization (EUA).  This EUA will remain in effect (meaning this tes t can be used) for the duration of  the COVID-19 declaration under Section 564(b)(1) of the Act, 21 U.S.C. section 360-bbb-3(b)(1), unless the authorization is terminated or revoked sooner.  Performed at [facility], [address] [place], [place] [address]          Impression:   40 y.o. female with with end stage renal disease, lupus nephritis, hypertension, nephrolithiasis  admitted on 1.30.22 for Seizure,Seizure disorder , altered mental status  [facility] MWF [place]. RIJ permcath 42kg.   As per the data from care everywhere   1)Renal    ESRD Patient is on hemodialysis Patient is a Monday Wednesday Friday schedule as outpatient Patient has not been for her dialysis treatment for a week We will dialyze patient today  2)HTN    Blood pressure is stable    3)Anemia of chronic disease  CBC Latest Ref Rng & Units 10/31/2020 10/26/2020 09/26/2020  WBC 4.0 - 10.5 K/uL 18.5(H) 14.9(H) 3.9(L)  Hemoglobin 12.0 - 15.0 g/dL 10.6(L) 10.6(L) 12.7  Hematocrit 36.0 - 46.0 % 33.9(L) 33.7(L) 39.9  Platelets 150 - 400 K/uL 66(L) 107(L) 46(L)       HGb at goal (9--11) We will keep patient on Epogen  4) Secondary hyperparathyroidism -CKD Mineral-Bone Disorder    Lab Results  Component Value Date   PTH 810 (H) 05/08/2020   CALCIUM 10.3 10/27/2020   PHOS 7.1 (H) 09/26/2020    Secondary Hyperparathyroidism present Phosphorus is not at goal. Patient is on binders  5) seizure disorder Primary team is following  6) Electrolytes   BMP Latest Ref Rng & Units 10/05/2020 09/28/2020 09/26/2020  Glucose 70 - 99 mg/dL 157(H) 94 93  BUN 6 - 20 mg/dL 97(H) 20 54(H)  Creatinine 0.44 - 1.00 mg/dL 22.78(H) 5.57(H) 13.99(H)  Sodium 135 - 145 mmol/L 143 138 138  Potassium 3.5 - 5.1 mmol/L 5.1 3.3(L) 3.7  Chloride 98 - 111 mmol/L 106 98 101  CO2 22 - 32 mmol/L 14(L) 27 22  Calcium 8.9 - 10.3 mg/dL 10.3 9.1 9.5     Sodium Normonatremic   Potassium Normokalemic    7)Acid base  Co2 is not at goal  143-120=23  Delta AG 23-12=9  Delta Bicarb 24-14=10  Patient has both nongap acidosis and nongap acidosis We will check patient serum lactate Renal  replacement therapy should help   8) COVID-19 Primary team is following   Plan:   We will ask for serum lactate level We will dialyze patient today  Addendum Checking patient lactate level it is high at 2.3  Addendum  patient had another episode of seizure and patient was thereafter intubated    Eldridge Marcott s [facility] 10/24/2020, 9:59 AM

## 2020-11-01 NOTE — Progress Notes (Signed)
Patient transported to CT with RN via trilogy transport vent.  Patient tolerated well.

## 2020-11-01 NOTE — ED Notes (Signed)
Safety sitter Patricia Maldonado at bedside, unable to reason with pt, pt without any intelligle speech unless hand is grabbed by pt to which pt says "go on now!", pt dialysis port on right upper chest dressing pulled away from pt, by pt, tape applied  Pt appears without urine on PJs, pt doesn't appear to have bitten tongue, lip, or cheek, pt appears without injury to head or bony prominence

## 2020-11-01 NOTE — ED Notes (Addendum)
Pt returned from CT.  Dr. Patsey Berthold to room to assess pt.  Pt is currently unresponsive to any stimuli with pinpoint pupils that are non reactive.  Dr. Patsey Berthold performed bedside US to evaluate for cardiac tamponade.  New orders received.

## 2020-11-01 NOTE — ED Notes (Signed)
Pt changed twice appears as bloody water, EDP aware

## 2020-11-01 NOTE — ED Provider Notes (Addendum)
I was called emergently to patient's bedside due to acute altered mental status and concern for possible seizure with abnormal respirations elevated heart rate and hypertension.  Per report and review of records patient is admitted after missing several sessions of dialysis with new seizure afforded last 24 hours and some confusion.  On my arrival patient is gurgling respirations and does not follow any commands or withdraw.  Pupils are symmetric and reactive bilaterally.  She has an SPO2 of greater than 92% but is tachycardic in the 140s on the monitor.  She is hypertensive with BPs ranging between the 180s and low 200s.  She was intubated for airway protection with 4 of Versed and rocuronium.  She had color change on her colorimeter and bilateral breath sounds.  Chest x-ray reviewed by self.  ET tube in appropriate position . propofol immediately started for sedation.  Point care glucose unremarkable.  Discussed with hospitalist will admit to ICU.   Procedure Name: Intubation Date/Time: 10/03/2020 12:24 PM Performed by: Lucrezia Starch, MD Pre-anesthesia Checklist: Emergency Drugs available, Timeout performed, Patient being monitored, Suction available and Patient identified Oxygen Delivery Method: Non-rebreather mask Preoxygenation: Pre-oxygenation with 100% oxygen Induction Type: Rapid sequence Grade View: Grade II Tube size: 7.0 mm Number of attempts: 1 Airway Equipment and Method: Video-laryngoscopy Secured at: 23 cm Tube secured with: ETT holder     .Critical Care Performed by: Lucrezia Starch, MD Authorized by: Lucrezia Starch, MD   Critical care provider statement:    Critical care time (minutes):  20   Critical care time was exclusive of:  Separately billable procedures and treating other patients   Critical care was necessary to treat or prevent imminent or life-threatening deterioration of the following conditions:  Respiratory failure   Critical care was time spent  personally by me on the following activities:  Discussions with consultants, evaluation of patient's response to treatment, examination of patient, ordering and performing treatments and interventions, ordering and review of laboratory studies, ordering and review of radiographic studies, pulse oximetry, re-evaluation of patient's condition, obtaining history from patient or surrogate and review of old charts     Lucrezia Starch, MD 10/10/2020 1227    Lucrezia Starch, MD 10/18/2020 1228

## 2020-11-01 NOTE — ED Provider Notes (Signed)
[facility] Emergency Department Provider Note  ____________________________________________   Event Date/Time   First MD Initiated Contact with Patient 10/26/2020 [phone]     (approximate)  I have reviewed the triage vital signs and the nursing notes.   HISTORY  Chief Complaint Altered Mental Status  Level 5 caveat:  history/ROS limited by acute/critical illness  HPI Patricia Maldonado is a 40 y.o. female well-known to the emergency department with extensive chronic medical issues as listed below, including multiple visits and admissions for combination of hypertensive encephalopathy, hypertensive emergency, seizure disorder, end-stage renal disease on hemodialysis, medication/treatment noncompliance, etc.   She presents tonight by EMS after an apparent seizure.  Reportedly she has not had dialysis in about a week.  She may have COVID although there is no report of a positive COVID test in the computer system.  She reportedly has not been taking her antiepileptic medications (Keppra).  She is post ictal or otherwise altered and not able to provide any history, minimally cooperative and somewhat combative.        Past Medical History:  Diagnosis Date  . Acute renal failure (ARF) ([place])   . Anemia   . Arthritis   . History of kidney stones   . Hypertension    OFF BP MEDS X 3 WEEKS DUE TO BP CONTROL  . 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 Rash   . Staghorn calculus   . 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[place]) 10/2016   [place] ADMISSION    Patient Active Problem List   Diagnosis Date Noted  . Hypertensive urgency, malignant   . Dyspnea   . Cannabis hyperemesis syndrome concurrent with and due to cannabis abuse ([place])   . Acute intractable headache   . Acute on chronic kidney failure ([place]) 09/25/2020  . Posterior reversible encephalopathy syndrome (PRES) 09/25/2020  . Blurred vision, bilateral 05/08/2020  . Seizures ([place]) 05/06/2020   . Cerebral edema ([place]) 12/23/2019  . Seizure disorder ([place]) 12/20/2019  . Acute repetitive seizure ([place]) 12/19/2019  . History of Closed sacral fracture ([place]) 12/19/2019  . Thrombocytopenia ([place]) 12/19/2019  . Anemia, unspecified 04/28/2019  . ESRD on hemodialysis ([place]) 02/21/2019  . Essential hypertension 02/21/2019  . CKD (chronic kidney disease), stage V ([place]) 09/12/2018  . Metabolic acidosis [id number]  . Secondary hyperparathyroidism ([place]) 09/12/2018  . Acute on chronic renal failure ([place]) 04/26/2018  . Acute kidney injury ([place]) 07/27/2017  . Alopecia of scalp 03/08/2017  . Thyroid nodule 03/08/2017  . PE (pulmonary thromboembolism) ([place]) 03/01/2017  . Kidney stone on left side 01/23/2017  . Systemic lupus erythematosus ([place]) 12/08/2016  . Encounter for long-term (current) use of high-risk medication 12/08/2016  . 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 Acute renal failure (ARF) ([place]) 10/04/2016  . Nephrolithiasis   . Staghorn calculus   . Acute renal failure with tubular necrosis ([place]) 08/30/2016    Past Surgical History:  Procedure Laterality Date  . CYSTOSCOPY/URETEROSCOPY/HOLMIUM LASER/STENT PLACEMENT Right 02/21/2017   Procedure: CYSTOSCOPY/URETEROSCOPY/HOLMIUM LASER/STENT EXCHANGE;  Surgeon: [name], MD;  Location: [facility] ORS;  Service: Urology;  Laterality: Right;  . DIALYSIS/PERMA CATHETER INSERTION N/A 11/30/2018   Procedure: DIALYSIS/PERMA CATHETER INSERTION;  Surgeon: Algernon Huxley, MD;  Location: [place] CV LAB;  Service: Cardiovascular;  Laterality: N/A;  . DIALYSIS/PERMA CATHETER INSERTION N/A 07/07/2020   Procedure: DIALYSIS/PERMA CATHETER INSERTION;  Surgeon: Katha Cabal, MD;  Location: [place] CV LAB;  Service: Cardiovascular;  Laterality: N/A;  . IR NEPHROSTOMY PLACEMENT RIGHT  01/23/2017  . NEPHROLITHOTOMY Right 01/24/2017   Procedure: NEPHROLITHOTOMY PERCUTANEOUS;  Surgeon: [name], MD;  Location: [facility]  ORS;  Service: Urology;  Laterality: Right;  . PERITONEAL CATHETER INSERTION    . TUBAL LIGATION      Prior to Admission medications   Medication Sig Start Date End Date Taking? Authorizing Provider  B Complex-C-Zn-Folic Acid (DIALYVITE/ZINC) TABS Take 1 tablet by mouth at bedtime. 05/27/20   [provider]  calcitRIOL (ROCALTROL) 0.5 MCG capsule 2 Capsule(s) By Mouth Daily    [provider]  carvedilol (COREG) 12.5 MG tablet Take 3 tablets (37.5 mg total) by mouth 2 (two) times daily with a meal. tablet Take 3 tablets (37.5 mg total) by mouth two (2) times a day 09/26/20 10/26/20  Max Sane, MD  cinacalcet (SENSIPAR) 30 MG tablet Take 30 mg by mouth daily.    [provider]  citalopram (CELEXA) 10 MG tablet Take 10 mg by mouth daily.    [provider]  hydroxychloroquine (PLAQUENIL) 200 MG tablet  04/25/20   [provider]  hydroxyurea (DROXIA) 200 MG capsule Take 200 mg by mouth daily.    [provider]  levETIRAcetam (KEPPRA) 500 MG tablet TAKE 1 TABLET BY MOUTH TWICE DAILY WITH ADDITIONAL 1 TABLET AFTER DIALYSIS    [provider]  mycophenolate (CELLCEPT) 500 MG tablet Take 2 tablets (1,000 mg total) by mouth 2 (two) times daily. 04/30/18   Hillary Bow, MD  NIFEdipine (PROCARDIA XL/NIFEDICAL-XL) 90 MG 24 hr tablet Take 1 tablet (90 mg total) by mouth daily. 09/26/20 10/26/20  Max Sane, MD  ondansetron (ZOFRAN-ODT) 4 MG disintegrating tablet Take 4 mg by mouth every 6 (six) hours as needed for nausea/vomiting. 09/09/20   [provider]  prochlorperazine (COMPAZINE) 10 MG tablet Take 1 tablet (10 mg total) by mouth every 6 (six) hours as needed for up to 15 doses (30 Min prior to dialysis.). 05/10/20   Para Skeans, MD  sevelamer carbonate (RENVELA) 800 MG tablet Take 1,600 mg by mouth 3 (three) times daily. 07/24/20   [provider]  valsartan (DIOVAN) 160 MG tablet Take 1 tablet (160 mg total) by mouth  daily. 09/26/20 10/26/20  Max Sane, MD    Allergies Penicillins  Family History  Problem Relation Age of Onset  . Thyroid disease Mother   . Thyroid disease Father   . Diabetes Father     Social History Social History   Tobacco Use  . Smoking status: Current Every Day Smoker    Packs/day: 0.25    Years: 15.00    Pack years: 3.75    Types: Cigarettes  . Smokeless tobacco: Never Used  Vaping Use  . Vaping Use: Never used  Substance Use Topics  . Alcohol use: No  . Drug use: Yes    Types: Marijuana    Comment: OCC    Review of Systems Level 5 caveat:  history/ROS limited by acute/critical illness   ____________________________________________   PHYSICAL EXAM:  VITAL SIGNS: ED Triage Vitals [10/23/2020 0501]  Enc Vitals Group     BP (!) 212/123     Pulse Rate 100     Resp 18     Temp      Temp src      SpO2 100 %     Weight      Height      Head Circumference      Peak Flow  Pain Score      Pain Loc      Pain Edu?      Excl. in [place]?     Constitutional: Awake but altered, unable to participate in exam or history. Eyes: Conjunctivae are normal.  Head: Atraumatic. Nose: No congestion/rhinnorhea. Mouth/Throat: Patient is wearing a mask. Neck: No stridor.  No meningeal signs.   Cardiovascular: Borderline tachycardia, regular rhythm. Good peripheral circulation.  Hemodialysis catheter is present in the right chest. Respiratory: Normal respiratory effort.  No retractions. Gastrointestinal: Soft and nontender. No distention.  Musculoskeletal: No lower extremity tenderness nor edema. No gross deformities of extremities. Neurologic: Altered, occasionally will make nonverbal sounds, protecting airway, moving all 4 extremities but will not follow commands and is minimally redirectable. Skin:  Skin is warm, dry and intact.   ____________________________________________   LABS (all labs ordered are listed, but only abnormal results are displayed)  Labs  Reviewed  SARS CORONAVIRUS 2 BY RT PCR ([facility]) - Abnormal; Notable for the following components:      Result Value   SARS Coronavirus 2 POSITIVE (*)    All other components within normal limits  CBC WITH DIFFERENTIAL/PLATELET - Abnormal; Notable for the following components:   WBC 14.9 (*)    RBC 3.69 (*)    Hemoglobin 10.6 (*)    HCT 33.7 (*)    Platelets 107 (*)    Neutro Abs 13.4 (*)    Abs Immature Granulocytes 0.15 (*)    All other components within normal limits  COMPREHENSIVE METABOLIC PANEL - Abnormal; Notable for the following components:   CO2 14 (*)    Glucose, Bld 157 (*)    BUN 97 (*)    Creatinine, Ser 22.78 (*)    Total Protein 8.3 (*)    GFR, Estimated 2 (*)    Anion gap 23 (*)    All other components within normal limits  MAGNESIUM - Abnormal; Notable for the following components:   Magnesium 2.6 (*)    All other components within normal limits  ETHANOL  CBG MONITORING, ED   ____________________________________________  EKG  ED ECG REPORT I, Hinda Kehr, the attending physician, personally viewed and interpreted this ECG.  Date: 10/28/2020 EKG Time: 5:04 AM Rate: 68 Rhythm: normal sinus rhythm QRS Axis: normal Intervals: normal ST/T Wave abnormalities: Non-specific ST segment / T-wave changes, but no clear evidence of acute ischemia. Narrative Interpretation: no definitive evidence of acute ischemia; does not meet STEMI criteria.  No concerning T wave abnormalities.  ____________________________________________  RADIOLOGY Ursula Alert, personally viewed and evaluated these images (plain radiographs) as part of my medical decision making, as well as reviewing the written report by the radiologist.  ED MD interpretation: Chest x-ray is unremarkable with no evidence of acute abnormality.  CT head pending at the time of transfer of care.  Official radiology report(s): DG Chest Portable 1  View  Result Date: 10/17/2020 CLINICAL DATA:  Seizure, ESRD, missed dialysis. EXAM: PORTABLE CHEST 1 VIEW COMPARISON:  Chest x-ray 09/28/2020, CT chest 07/07/2020 FINDINGS: Right chest wall dialysis catheter with tip overlying the right atrium. The heart size and mediastinal contours are unchanged. Vague oval 2.4 cm density overlying the left mid lung zone likely external to the patient. No focal consolidation. No pulmonary edema. No pleural effusion. No pneumothorax. No acute osseous abnormality. IMPRESSION: 1. No active disease. 2. Vague oval 2.4 cm density overlying the left mid lung zone likely external to the patient.  Electronically Signed   By: Iven Finn M.D.   On: 10/03/2020 06:17    ____________________________________________   PROCEDURES   Procedure(s) performed (including Critical Care):  .Critical Care Performed by: Hinda Kehr, MD Authorized by: Hinda Kehr, MD   Critical care provider statement:    Critical care time (minutes):  45   Critical care time was exclusive of:  Separately billable procedures and treating other patients   Critical care was necessary to treat or prevent imminent or life-threatening deterioration of the following conditions:  CNS failure or compromise and renal failure   Critical care was time spent personally by me on the following activities:  Development of treatment plan with patient or surrogate, discussions with consultants, evaluation of patient's response to treatment, examination of patient, obtaining history from patient or surrogate, ordering and performing treatments and interventions, ordering and review of laboratory studies, ordering and review of radiographic studies, pulse oximetry, re-evaluation of patient's condition and review of old charts .1-3 Lead EKG Interpretation Performed by: Hinda Kehr, MD Authorized by: Hinda Kehr, MD     Interpretation: abnormal     ECG rate:  110   ECG rate assessment: tachycardic      Rhythm: sinus tachycardia     Ectopy: none     Conduction: normal       ____________________________________________   INITIAL IMPRESSION / MDM / ASSESSMENT AND PLAN / ED COURSE  As part of my medical decision making, I reviewed the following data within the [place] notes reviewed and incorporated, Labs reviewed , EKG interpreted , Old chart reviewed, Patient signed out to Dr. Kerman Passey, Radiograph reviewed , Discussed with nephrologist (Dr. Theador Hawthorne) and Notes from prior ED visits   Differential diagnosis includes, but is not limited to, postictal state after seizure with known seizure disorder medication noncompliance, PRES, acute intracranial hemorrhage, hypertensive emergency, hypertensive encephalopathy, metabolic or electrolyte abnormality, COVID-19, other infectious process.  The patient is on the cardiac monitor to evaluate for evidence of arrhythmia and/or significant heart rate changes.  Reportedly there is a possibility of COVID-19 infection so PCR is pending.  She is not in any respiratory distress and she is protecting her airway but she is clearly very altered.  Given the report of seizures I ordered Keppra 1000 mg IV loading dose.  I also ordered Versed 2 mg IV to help calm her down given her altered mental status and recent seizure-like activity.  Lab work is pending.  EKG generally reassuring with no substantial T wave elevation.     Clinical Course as of 10/03/2020 0750  Nancy Fetter Nov 01, 2020  0600 Comprehensive metabolic panel shows potassium of 5.1 which is reassuring.  Creatinine is up to 22.78 and BUN is up to 97.  Although she has had high creatinines before and she is end-stage renal on hemodialysis, her BUN is considerably higher than usual.  Anion gap is 23 but this is difficult to interpret in the setting of end-stage renal disease and dialysis.  CBC is notable for a leukocytosis of 14.9 and an essentially normal hemoglobin.  Alcohol level is  negative.  I looked through the medical record and she has no history of positive cocaine on her urine drug screens I ordered labetalol 20 mg IV to help with her substantial hypertension.  Magnesium level is 2.6.  Patient still combative, after verifying a normal QTC I administered droperidol 5 mg IV as a calming agent. [CF]  [id number] I personally reviewed the patient's imaging  and agree with the radiologist's interpretation that there is no evidence of acute abnormality on chest x-ray including no pulmonary edema. [CF]  0614 SARS Coronavirus 2(!): POSITIVE [CF]  [id number] Paging nephrology [CF]  [phone] ED nurse reports when he was changing the patient that she is now having some bright red blood per rectum. [CF]  [id number] Blood pressure has improved after labetalol to about [id number] systolic.   [CF]  [id number] Transferred ED care to Dr. Kerman Passey to discuss the patient with the hospitalist and get her admitted. [CF]    Clinical Course User Index [CF] Hinda Kehr, MD     ____________________________________________  FINAL CLINICAL IMPRESSION(S) / ED DIAGNOSES  Final diagnoses:  [id number]  ESRD on hemodialysis ([place])  Malignant hypertension  Seizure ([place])  Noncompliance with medications  Encephalopathy  Rectal bleeding     MEDICATIONS GIVEN DURING THIS VISIT:  Medications  levETIRAcetam (KEPPRA) IVPB 1000 mg/100 mL premix (0 mg Intravenous Stopped 10/10/2020 0516)  midazolam (VERSED) injection 2 mg (2 mg Intravenous Given 10/31/2020 0519)  droperidol (INAPSINE) 2.5 MG/ML injection 5 mg (5 mg Intravenous Given 10/08/2020 0645)  labetalol (NORMODYNE) injection 20 mg (20 mg Intravenous Given 10/29/2020 0700)     ED Discharge Orders    None      *Please note:  Shavaughn S Mcclintic was evaluated in Emergency Department on 10/25/2020 for the symptoms described in the history of present illness. She was evaluated in the context of the global COVID-19 pandemic, which necessitated consideration that the patient might be at  risk for infection with the SARS-CoV-2 virus that causes COVID-19. Institutional protocols and algorithms that pertain to the evaluation of patients at risk for COVID-19 are in a state of rapid change based on information released by regulatory bodies including the CDC and federal and state organizations. These policies and algorithms were followed during the patient's care in the ED.  Some ED evaluations and interventions may be delayed as a result of limited staffing during and after the pandemic.*  Note:  This document was prepared using Dragon voice recognition software and may include unintentional dictation errors.   Hinda Kehr, MD 10/03/2020 [phone]

## 2020-11-01 NOTE — ED Notes (Signed)
Dr. Creig Hines to room, Ativan given per prn orders.  Preparation for intubation as pt is not maintaining airway at this time.

## 2020-11-01 NOTE — ED Triage Notes (Signed)
Patient presents to Emergency Department via [facility] from home, per EMS pt's sig other reports that pt had a seizure, no hx of the same, per same pt hasn't had dialysis in a week because she has COVID  Pt is not responsive and won't follow commands, difficulty keeping pt in bed, poor safety awarness

## 2020-11-01 NOTE — ED Notes (Signed)
Report received, care of pt assumed.  PT continues to be altered and unable to follow commands.  Safety sitter at bedside.  Pt is not easily redirectable, no verbalization at this time, only moaning.  Unable to obtain accurate VS due to pt constant movement.  Will continue to monitor.

## 2020-11-01 NOTE — ED Notes (Signed)
PT continues to be uncooperative, unable to follow commands, constant movement in bed.  Safety sitter remains at bedside.  Unable to obtain accurate VS.  Nephrology at bedside, will plan dialysis today in room.  Will continue to monitor closely.

## 2020-11-01 NOTE — Progress Notes (Signed)
Pt was noted to be diaphoretic and clammy, difficulty obtaining a blood pressure, pulse ox HR only showing HR of 36. Femoral pulse present, Radial pulse present but thready, ED staff into room to assess patient, HD tx paused, patient rinsed back, Dr. Theador Hawthorne aware. Will resume HD treatment once patient is assessed and found stable per Dr Theador Hawthorne.

## 2020-11-01 NOTE — ED Notes (Signed)
PT initially responded to fluid bolus, then began to decompensate again.  Dr. Patsey Berthold notified, new orders received.  Will continue to monitor closely.

## 2020-11-02 DIAGNOSIS — J9601 Acute respiratory failure with hypoxia: Secondary | ICD-10-CM

## 2020-11-02 DIAGNOSIS — G9341 Metabolic encephalopathy: Secondary | ICD-10-CM

## 2020-11-02 DIAGNOSIS — U071 COVID-19: Secondary | ICD-10-CM

## 2020-11-02 DIAGNOSIS — I161 Hypertensive emergency: Secondary | ICD-10-CM | POA: Diagnosis not present

## 2020-11-02 DIAGNOSIS — R402433 Glasgow coma scale score 3-8, at hospital admission: Secondary | ICD-10-CM | POA: Diagnosis not present

## 2020-11-02 LAB — BLOOD GAS, ARTERIAL
Acid-base deficit: 3.5 mmol/L — ABNORMAL HIGH (ref 0.0–2.0)
Bicarbonate: 18.7 mmol/L — ABNORMAL LOW (ref 20.0–28.0)
FIO2: 0.28
MECHVT: 350 mL
O2 Saturation: 98.9 %
PEEP: 5 cmH2O
Patient temperature: 37
pCO2 arterial: 24 mmHg — ABNORMAL LOW (ref 32.0–48.0)
pH, Arterial: 7.5 — ABNORMAL HIGH (ref 7.350–7.450)
pO2, Arterial: 116 mmHg — ABNORMAL HIGH (ref 83.0–108.0)

## 2020-11-02 LAB — CBC WITH DIFFERENTIAL/PLATELET
Abs Immature Granulocytes: 0.11 10*3/uL — ABNORMAL HIGH (ref 0.00–0.07)
Basophils Absolute: 0 10*3/uL (ref 0.0–0.1)
Basophils Relative: 0 %
Eosinophils Absolute: 0 10*3/uL (ref 0.0–0.5)
Eosinophils Relative: 0 %
HCT: 26.3 % — ABNORMAL LOW (ref 36.0–46.0)
Hemoglobin: 8.6 g/dL — ABNORMAL LOW (ref 12.0–15.0)
Immature Granulocytes: 1 %
Lymphocytes Relative: 5 %
Lymphs Abs: 0.8 10*3/uL (ref 0.7–4.0)
MCH: 29 pg (ref 26.0–34.0)
MCHC: 32.7 g/dL (ref 30.0–36.0)
MCV: 88.6 fL (ref 80.0–100.0)
Monocytes Absolute: 0.3 10*3/uL (ref 0.1–1.0)
Monocytes Relative: 2 %
Neutro Abs: 14.9 10*3/uL — ABNORMAL HIGH (ref 1.7–7.7)
Neutrophils Relative %: 92 %
Platelets: 50 10*3/uL — ABNORMAL LOW (ref 150–400)
RBC: 2.97 MIL/uL — ABNORMAL LOW (ref 3.87–5.11)
RDW: 15.3 % (ref 11.5–15.5)
WBC: 16.2 10*3/uL — ABNORMAL HIGH (ref 4.0–10.5)
nRBC: 0 % (ref 0.0–0.2)

## 2020-11-02 LAB — LACTATE DEHYDROGENASE: LDH: 566 U/L — ABNORMAL HIGH (ref 98–192)

## 2020-11-02 LAB — CBC
HCT: 25.2 % — ABNORMAL LOW (ref 36.0–46.0)
HCT: 26.5 % — ABNORMAL LOW (ref 36.0–46.0)
HCT: 27.4 % — ABNORMAL LOW (ref 36.0–46.0)
HCT: 28.1 % — ABNORMAL LOW (ref 36.0–46.0)
Hemoglobin: 8.2 g/dL — ABNORMAL LOW (ref 12.0–15.0)
Hemoglobin: 8.5 g/dL — ABNORMAL LOW (ref 12.0–15.0)
Hemoglobin: 9 g/dL — ABNORMAL LOW (ref 12.0–15.0)
Hemoglobin: 9.5 g/dL — ABNORMAL LOW (ref 12.0–15.0)
MCH: 28.4 pg (ref 26.0–34.0)
MCH: 28.8 pg (ref 26.0–34.0)
MCH: 29 pg (ref 26.0–34.0)
MCH: 29.6 pg (ref 26.0–34.0)
MCHC: 32.1 g/dL (ref 30.0–36.0)
MCHC: 32.5 g/dL (ref 30.0–36.0)
MCHC: 32.8 g/dL (ref 30.0–36.0)
MCHC: 33.8 g/dL (ref 30.0–36.0)
MCV: 87.5 fL (ref 80.0–100.0)
MCV: 87.8 fL (ref 80.0–100.0)
MCV: 88.6 fL (ref 80.0–100.0)
MCV: 89 fL (ref 80.0–100.0)
Platelets: 54 10*3/uL — ABNORMAL LOW (ref 150–400)
Platelets: 62 10*3/uL — ABNORMAL LOW (ref 150–400)
Platelets: 64 10*3/uL — ABNORMAL LOW (ref 150–400)
Platelets: 65 10*3/uL — ABNORMAL LOW (ref 150–400)
RBC: 2.83 MIL/uL — ABNORMAL LOW (ref 3.87–5.11)
RBC: 2.99 MIL/uL — ABNORMAL LOW (ref 3.87–5.11)
RBC: 3.12 MIL/uL — ABNORMAL LOW (ref 3.87–5.11)
RBC: 3.21 MIL/uL — ABNORMAL LOW (ref 3.87–5.11)
RDW: 15.2 % (ref 11.5–15.5)
RDW: 15.5 % (ref 11.5–15.5)
RDW: 15.6 % — ABNORMAL HIGH (ref 11.5–15.5)
RDW: 15.6 % — ABNORMAL HIGH (ref 11.5–15.5)
WBC: 16.9 10*3/uL — ABNORMAL HIGH (ref 4.0–10.5)
WBC: 19.2 10*3/uL — ABNORMAL HIGH (ref 4.0–10.5)
WBC: 19.9 10*3/uL — ABNORMAL HIGH (ref 4.0–10.5)
WBC: 21.9 10*3/uL — ABNORMAL HIGH (ref 4.0–10.5)
nRBC: 0 % (ref 0.0–0.2)
nRBC: 0 % (ref 0.0–0.2)
nRBC: 0 % (ref 0.0–0.2)
nRBC: 0 % (ref 0.0–0.2)

## 2020-11-02 LAB — C-REACTIVE PROTEIN: CRP: <0.5 mg/dL (ref <1.0)

## 2020-11-02 LAB — BPAM PLATELET PHERESIS
Blood Product Expiration Date: 202201302359
ISSUE DATE / TIME: 202201301736
Unit Type and Rh: 6200

## 2020-11-02 LAB — BASIC METABOLIC PANEL
Anion gap: 21 — ABNORMAL HIGH (ref 5–15)
BUN: 49 mg/dL — ABNORMAL HIGH (ref 6–20)
CO2: 16 mmol/L — ABNORMAL LOW (ref 22–32)
Calcium: 8.6 mg/dL — ABNORMAL LOW (ref 8.9–10.3)
Chloride: 104 mmol/L (ref 98–111)
Creatinine, Ser: 13.64 mg/dL — ABNORMAL HIGH (ref 0.44–1.00)
GFR, Estimated: 3 mL/min — ABNORMAL LOW (ref >60)
Glucose, Bld: 137 mg/dL — ABNORMAL HIGH (ref 70–99)
Potassium: 4.1 mmol/L (ref 3.5–5.1)
Sodium: 141 mmol/L (ref 135–145)

## 2020-11-02 LAB — PROTIME-INR
INR: 1.2 (ref 0.8–1.2)
Prothrombin Time: 14.4 seconds (ref 11.4–15.2)

## 2020-11-02 LAB — GLUCOSE, CAPILLARY
Glucose-Capillary: 132 mg/dL — ABNORMAL HIGH (ref 70–99)
Glucose-Capillary: 135 mg/dL — ABNORMAL HIGH (ref 70–99)
Glucose-Capillary: 136 mg/dL — ABNORMAL HIGH (ref 70–99)
Glucose-Capillary: 138 mg/dL — ABNORMAL HIGH (ref 70–99)
Glucose-Capillary: 139 mg/dL — ABNORMAL HIGH (ref 70–99)
Glucose-Capillary: 144 mg/dL — ABNORMAL HIGH (ref 70–99)
Glucose-Capillary: 145 mg/dL — ABNORMAL HIGH (ref 70–99)
Glucose-Capillary: 147 mg/dL — ABNORMAL HIGH (ref 70–99)

## 2020-11-02 LAB — PROCALCITONIN: Procalcitonin: 2.21 ng/mL

## 2020-11-02 LAB — IRON AND TIBC
Iron: 89 ug/dL (ref 28–170)
Saturation Ratios: 63 % — ABNORMAL HIGH (ref 10.4–31.8)
TIBC: 141 ug/dL — ABNORMAL LOW (ref 250–450)
UIBC: 52 ug/dL

## 2020-11-02 LAB — PHOSPHORUS: Phosphorus: 8.4 mg/dL — ABNORMAL HIGH (ref 2.5–4.6)

## 2020-11-02 LAB — CORTISOL: Cortisol, Plasma: >100 ug/dL

## 2020-11-02 LAB — PREPARE PLATELET PHERESIS: Unit division: 0

## 2020-11-02 LAB — PATHOLOGIST SMEAR REVIEW

## 2020-11-02 LAB — VITAMIN B12: Vitamin B-12: 248 pg/mL (ref 180–914)

## 2020-11-02 LAB — MAGNESIUM: Magnesium: 1.5 mg/dL — ABNORMAL LOW (ref 1.7–2.4)

## 2020-11-02 LAB — APTT: aPTT: 30 seconds (ref 24–36)

## 2020-11-02 NOTE — Plan of Care (Signed)
  Problem: Education: Goal: Knowledge of General Education information will improve Description: Including pain rating scale, medication(s)/side effects and non-pharmacologic comfort measures Outcome: Not Progressing   Problem: Health Behavior/Discharge Planning: Goal: Ability to manage health-related needs will improve Outcome: Not Progressing   Problem: Clinical Measurements: Goal: Ability to maintain clinical measurements within normal limits will improve Outcome: Not Progressing Goal: Will remain free from infection Outcome: Not Progressing Goal: Diagnostic test results will improve Outcome: Not Progressing Goal: Respiratory complications will improve Outcome: Not Progressing Goal: Cardiovascular complication will be avoided Outcome: Not Progressing   Problem: Activity: Goal: Risk for activity intolerance will decrease Outcome: Not Progressing   Problem: Nutrition: Goal: Adequate nutrition will be maintained Outcome: Not Progressing   Problem: Coping: Goal: Level of anxiety will decrease Outcome: Not Progressing   Problem: Elimination: Goal: Will not experience complications related to bowel motility Outcome: Not Progressing Goal: Will not experience complications related to urinary retention Outcome: Not Progressing   Problem: Pain Managment: Goal: General experience of comfort will improve Outcome: Not Progressing   Problem: Safety: Goal: Ability to remain free from injury will improve Outcome: Not Progressing   Problem: Skin Integrity: Goal: Risk for impaired skin integrity will decrease Outcome: Not Progressing   Problem: Education: Goal: Expressions of having a comfortable level of knowledge regarding the disease process will increase Outcome: Not Progressing   Problem: Coping: Goal: Ability to adjust to condition or change in health will improve Outcome: Not Progressing Goal: Ability to identify appropriate support needs will improve Outcome: Not  Progressing   Problem: Health Behavior/Discharge Planning: Goal: Compliance with prescribed medication regimen will improve Outcome: Not Progressing   Problem: Medication: Goal: Risk for medication side effects will decrease Outcome: Not Progressing   Problem: Clinical Measurements: Goal: Complications related to the disease process, condition or treatment will be avoided or minimized Outcome: Not Progressing Goal: Diagnostic test results will improve Outcome: Not Progressing   Problem: Safety: Goal: Verbalization of understanding the information provided will improve Outcome: Not Progressing   Problem: Self-Concept: Goal: Level of anxiety will decrease Outcome: Not Progressing Goal: Ability to verbalize feelings about condition will improve Outcome: Not Progressing

## 2020-11-02 NOTE — Progress Notes (Signed)
eeg done °

## 2020-11-02 NOTE — Progress Notes (Signed)
   11/02/20 1010  Clinical Encounter Type  Visited With Other (Comment) (The pt was not available for a visit. There was no family available.)   I attempted to follow up with Ms. Ensminger per the request of Chaplain Ormond. Ms. Vogelman was asleep and there was no family available.   [place], [place]

## 2020-11-02 NOTE — Care Plan (Signed)
Note made of brain herniation. Given poor prognosis we will sign off at this time. Please call back if there are any questions or needs from a GI perspective.  Raylene Miyamoto MD, MPH [place]

## 2020-11-02 NOTE — Progress Notes (Signed)
CRITICAL CARE NOTE 40 year old, current smoker, with a very complex medical history to include SLE with lupus nephritis, ESRD on hemodialysis (MWF) hypertension, prior episode of PRES and thrombocytopenia who presented to the emergency room today after being noted to be unresponsive post a seizure at home.  Details of this are sketchy.  I attempted to obtain history from the patient's mother however she was not able to provide much information as she does not live with the patient.  She did say that she was noted to have a seizure this morning.  She apparently has a daughter who is 81 years old and cannot make decisions for her mother so the patient's mother makes decisions.  The patient apparently had been quarantining at home for the last week due to potential COVID-19 and had not been going to dialysis.  She apparently had not been taking her antiepileptic medication (Keppra).  Initially she was noted to be combative and delirious not following commands, she was noted to have rectal bleeding with a loose stool, she was noted to be very hypertensive with initial blood pressure of 212/123 which improved after receiving labetalol in the emergency room.  No further history could be obtained.  ED course: Patient has tested positive for COVID-19.  She was noted to have fecal occult blood positive and has been evaluated by GI and by nephrology.  At around 12:15 PM the emergency room physician was called to the patient's bedside because of the patient having gurgling respirations.  She apparently had had an apparent seizure.  She was noted to be tachycardic and hypertensive and at that point was intubated to protect her airway.  PCCM consulted for management of respiratory failure now mechanically ventilated.  She is currently being dialyzed.   1/31 severe brain damage, multiorgan failure   CC  follow up respiratory failure  SUBJECTIVE Patient remains critically ill Prognosis is guarded   BP (!)  140/108   Pulse (!) 103   Temp (!) 95.72 F (35.4 C)   Resp 20   Ht '5\' 2"'$  (1.575 m)   Wt 43.9 kg   SpO2 100%   BMI 17.70 kg/m    I/O last 3 completed shifts: In: 840.5 [I.V.:375.5; Blood:265; IV Piggyback:200] Out: -  No intake/output data recorded.  SpO2: 100 % FiO2 (%): 28 %  Estimated body mass index is 17.7 kg/m as calculated from the following:   Height as of this encounter: '5\' 2"'$  (1.575 m).   Weight as of this encounter: 43.9 kg.  SIGNIFICANT EVENTS   REVIEW OF SYSTEMS  PATIENT IS UNABLE TO PROVIDE COMPLETE REVIEW OF SYSTEMS DUE TO SEVERE CRITICAL ILLNESS       PHYSICAL EXAMINATION: GENERAL:critically ill appearing, +resp distress NECK: Supple.  PULMONARY: +rhonchi, +wheezing CARDIOVASCULAR: S1 and S2.  GASTROINTESTINAL: Soft, nontender, +Positive bowel sounds.  MUSCULOSKELETAL: No swelling, clubbing, or edema.  NEUROLOGIC: obtunded, GCS<8 SKIN:intact,warm,dry    Patient assessed or the symptoms described in the history of present illness.  In the context of the Global COVID-19 pandemic, which necessitated consideration that the patient might be at risk for infection with the SARS-CoV-2 virus that causes COVID-19, Institutional protocols and algorithms that pertain to the evaluation of patients at risk for COVID-19 are in a state of rapid change based on information released by regulatory bodies including the CDC and federal and state organizations. These policies and algorithms were followed during the patient's care while in hospital.    MEDICATIONS: I have reviewed all medications and confirmed  regimen as documented   CULTURE RESULTS   Recent Results (from the past 240 hour(s))  SARS Coronavirus 2 by RT PCR (hospital order, performed in [facility] hospital lab) Nasopharyngeal Nasopharyngeal Swab     Status: Abnormal   Collection Time: 10/31/2020  4:59 AM   Specimen: Nasopharyngeal Swab  Result Value Ref Range Status   SARS Coronavirus 2 POSITIVE  (A) NEGATIVE Final    Comment: RESULT CALLED TO, READ BACK BY AND VERIFIED WITH:  DAWN TULLOCH AT 0600 10/06/2020 SDR (NOTE) SARS-CoV-2 target nucleic acids are DETECTED  SARS-CoV-2 RNA is generally detectable in upper respiratory specimens  during the acute phase of infection.  Positive results are indicative  of the presence of the identified virus, but do not rule out bacterial infection or co-infection with other pathogens not detected by the test.  Clinical correlation with patient history and  other diagnostic information is necessary to determine patient infection status.  The expected result is negative.  Fact Sheet for Patients:   StrictlyIdeas.no   Fact Sheet for Healthcare Providers:   BankingDealers.co.za    This test is not yet approved or cleared by the [place] FDA and  has been authorized for detection and/or diagnosis of SARS-CoV-2 by FDA under an Emergency Use Authorization (EUA).  This EUA will remain in effect (meaning this tes t can be used) for the duration of  the COVID-19 declaration under Section 564(b)(1) of the Act, 21 U.S.C. section 360-bbb-3(b)(1), unless the authorization is terminated or revoked sooner.  Performed at [facility], [address] [place], [place] [address]   MRSA PCR Screening     Status: None   Collection Time: 10/08/2020  5:36 PM   Specimen: Nasopharyngeal  Result Value Ref Range Status   MRSA by PCR NEGATIVE NEGATIVE Final    Comment:        The GeneXpert MRSA Assay (FDA approved for NASAL specimens only), is one component of a comprehensive MRSA colonization surveillance program. It is not intended to diagnose MRSA infection nor to guide or monitor treatment for MRSA infections. Performed at [facility], [address] [place], [place] [address]           IMAGING    DG Eye Foreign Body  Result Date: 10/05/2020 CLINICAL DATA:  Metal  working/exposure; clearance prior to MRI EXAM: ORBITS FOR FOREIGN BODY - 2 VIEW COMPARISON:  CT from the same day FINDINGS: Exam is limited by patient positioning and patient condition. There was no definite metallic foreign body identified on this examination. The patient appears to be intubated. IMPRESSION: No evidence of metallic foreign body within the orbits. Electronically Signed   By: Constance Holster M.D.   On: 10/19/2020 18:21   DG Abd 1 View  Result Date: 10/27/2020 CLINICAL DATA:  Metallic foreign body EXAM: ABDOMEN - 1 VIEW COMPARISON:  None. FINDINGS: The bowel gas pattern is nonspecific and nonobstructive. There are no definite radiopaque kidney stones. No unexpected metallic foreign body. Phleboliths project over the patient's pelvis. The tip of the enteric tube is visualized and may be terminate short of the stomach. IMPRESSION: 1. No unexpected metallic foreign body. 2. Nonobstructive bowel gas pattern. 3. The enteric tube appears to terminate proximal to the gastric body. Repositioning is recommended. These results will be called to the ordering clinician or representative by the Radiologist Assistant, and communication documented in the PACS or [employer]. Electronically Signed   By: Constance Holster M.D.   On: 10/10/2020 18:22  CT Head Wo Contrast  Result Date: 10/11/2020 CLINICAL DATA:  Nontraumatic seizure EXAM: CT HEAD WITHOUT CONTRAST TECHNIQUE: Contiguous axial images were obtained from the base of the skull through the vertex without intravenous contrast. COMPARISON:  09/25/2020 FINDINGS: Brain: Normal ventricular morphology. No midline shift or mass effect. Normal appearance of brain parenchyma. No intracranial hemorrhage, mass lesion, or evidence of acute infarction. No extra-axial fluid collections. Vascular: No hyperdense vessels Skull: Intact Sinuses/Orbits: Minimal mucosal thickening in sphenoid sinus and a few ethmoid air cells. Otherwise clear paranasal sinuses  and mastoid air cells Other: N/A IMPRESSION: No acute intracranial abnormalities. Electronically Signed   By: Lavonia Dana M.D.   On: 10/31/2020 13:22   MR BRAIN WO CONTRAST  Addendum Date: 11/02/2020   ADDENDUM REPORT: 11/02/2020 06:15 ADDENDUM: Findings again communicated by telephone to the critical care nurse practitioner Domingo Pulse at 11:41 p.m. on 10/27/2020 by Dr. Acquanetta Chain. Electronically Signed   By: Jeannine Boga M.D.   On: 11/02/2020 06:15   Result Date: 11/02/2020 CLINICAL DATA:  Initial evaluation for abnormal neuro exam, seizure. EXAM: MRI HEAD WITHOUT CONTRAST TECHNIQUE: Multiplanar, multiecho pulse sequences of the brain and surrounding structures were obtained without intravenous contrast. COMPARISON:  Prior head CT from earlier the same day. FINDINGS: Brain: Brain is diffusely abnormal in appearance with diffuse cortical/gyral swelling and edema involving both cerebral hemispheres in a fairly symmetric fashion, with extensive involvement of the cerebellum as well. Increased T2/FLAIR hyperintensity seen involving the deep gray nuclei as well. Associated diffuse loss of cortical sulcation, consistent with edema. Ventricles are slit-like in appearance. Basilar cisterns are largely effaced. Evidence for transtentorial herniation with the cerebellar tonsils extending up to 8 mm through the foramen magnum (series 9, image 10). No definite associated diffusion abnormality, although DWI sequences are diffusely abnormal in appearance due to the underlying parenchymal changes throughout the brain. There are a few scattered foci of increased T2/FLAIR signal intensity involving the periventricular white matter as well as the splenium of the corpus callosum (series 15, images 30, 32). Possible additional superimposed patchy T2/FLAIR signal abnormality noted within the cerebellum as well (series 15, image 15). Findings are nonspecific, but most consistent with global cerebral edema, possibly  related to anoxia and/or hypoperfusion. A degree of superimposed press may be contributory given the periventricular and cerebellar signal changes. No acute intracranial hemorrhage. Diffuse prominence and engorgement of the venous structures seen throughout the brain on SWI sequence related to underlying cerebral edema. No mass lesion or midline shift.  No extra-axial fluid collection. Vascular: Major intracranial vascular flow voids are not well assessed due to the diffuse cerebral edema, although the ICAs do appear to be grossly patent through the proximal cavernous segments. Posterior circulation not well seen. Major dural sinuses appear compressed related to edema. Skull and upper cervical spine: Evidence of transtentorial herniation with the cerebellar tonsils extending up to 8 mm through the foramen magnum. Secondary crowding at the craniocervical junction. Bone marrow signal intensity within normal limits. No scalp soft tissue abnormality. Sinuses/Orbits: Scattered thickening noted along the posterior sclera/retina of both globes, of uncertain significance (series 15, image 16, 20), of uncertain significance. Globes and orbital soft tissues otherwise demonstrate no acute finding. Scattered mucosal thickening seen throughout the paranasal sinuses. Small bilateral mastoid effusions noted. Patient is intubated. Other: None. IMPRESSION: 1. Diffusely abnormal appearance of the brain with diffuse cortical/gyral swelling and edema involving both cerebral hemispheres and cerebellum, with symmetric involvement of the deep gray nuclei. Findings are nonspecific, but  consistent with global cerebral edema, possibly related to anoxic and/or hypoperfusion injury. Possible severe COVID-19 encephalitis could also be considered. 2. 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 3. Few scattered superimposed patchy  T2/FLAIR signal abnormality involving the periventricular white matter, corpus callosum, and cerebellum, nonspecific, but could reflect a degree of superimposed PRES and/or changes related to COVID encephalitis. 4. Irregular thickening along the posterior sclera/retina of both globes, of uncertain significance, but possibly related to elevated ICP. Correlation with physical exam recommended. Critical Value/emergent results were phoned communicated by telephone to the critical care nurse taking care of the patient at 11:24 p.m. on 10/25/2020. Currently awaiting a call back from the covering critical care nurse practitioner to communicate these findings as well. Electronically Signed: By: Jeannine Boga M.D. On: 10/11/2020 23:30   DG Chest Portable 1 View  Result Date: 10/22/2020 CLINICAL DATA:  Status post intubation. EXAM: PORTABLE CHEST 1 VIEW COMPARISON:  Earlier today. FINDINGS: Interval endotracheal tube in satisfactory position. Stable right jugular catheter with its tip in the inferior aspect of the right atrium. Mildly enlarged cardiac silhouette with an interval decrease in size. The previously seen nodular density overlying the left lower lung zone is currently projected more inferiorly and, overlying the upper left diaphragm and shown to represent a monitor lead attachment. Clear lungs with normal vascularity. Unremarkable bones. IMPRESSION: 1. Endotracheal tube in satisfactory position. 2. No acute abnormality. Electronically Signed   By: Claudie Revering M.D.   On: 10/29/2020 12:50     Nutrition Status:           Indwelling Urinary Catheter continued, requirement due to   Reason to continue Indwelling Urinary Catheter strict Intake/Output monitoring for hemodynamic instability   Central Line/ continued, requirement due to  Reason to continue [place] of central venous pressure or other hemodynamic parameters and poor IV access   Ventilator continued, requirement due  to severe respiratory failure   Ventilator Sedation RASS 0 to -2      ASSESSMENT AND PLAN SYNOPSIS   Synopsis: 40 year old woman with known lupus and lupus nephritis, ESRD, COVID-19 infection presented with seizures and now required intubation for airway protection.  Initial presentation was that of a hypertensive emergency now with compromised airway and intubated and mechanically ventilated.  She has COVID-19 infection   appear to have COVID pneumonia.   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    Acute hypoxemic respiratory failure due to COVID-19 pneumonia / ARDS Mechanical ventilation via ARDS protocol, target PRVC 6 cc/kg Wean PEEP and FiO2 as able VAP prevention order set Remdesivir  IV STEROIDS   Severe ACUTE Hypoxic and Hypercapnic Respiratory Failure -continue Full MV support -continue Bronchodilator Therapy -Wean Fio2 and PEEP as tolerated -VAP/VENT bundle implementation  END STAGE  KIDNEY INJURY/Renal Failure -continue Foley Catheter-assess need -Avoid nephrotoxic agents -Follow urine output, BMP -Ensure adequate renal perfusion, optimize oxygenation -Renal dose medications     NEUROLOGY Acute toxic metabolic encephalopathy, need for sedation Goal RASS -2 to -3 NEURO CONSULTED SEVERE BRAIN DAMAGE-very poor prognosis   CARDIAC ICU monitoring  ID -continue IV abx as prescibed -follow up cultures  GI GI PROPHYLAXIS as indicated   DIET-->TF's as tolerated Constipation protocol as indicated  ENDO - will use ICU hypoglycemic\Hyperglycemia protocol if indicated     ELECTROLYTES -follow labs as needed -replace as needed -pharmacy consultation and  following   DVT/GI PRX ordered and assessed TRANSFUSIONS AS NEEDED MONITOR FSBS I Assessed the need for Labs I Assessed the need for Foley I Assessed the need for Central Venous Line Family Discussion when available I Assessed  the need for Mobilization I made an Assessment of medications to be adjusted accordingly Safety Risk assessment completed   CASE DISCUSSED IN MULTIDISCIPLINARY ROUNDS WITH ICU TEAM  Critical Care Time devoted to patient care services described in this note is 56 minutes.   Overall, patient is critically ill, prognosis is guarded.  Patient with Multiorgan failure and at high risk for cardiac arrest and death.    Corrin Parker, M.D.  [facility]  Medical Director [place] Director [facility] Cardio-Pulmonary Department

## 2020-11-02 NOTE — Consult Note (Signed)
Neurology Consultation Reason for Consult: Brain injury Referring Physician: Mortimer Fries, K  CC: Unresponsiveness  History is obtained from: Chart review  HPI: Patricia Maldonado is a 40 y.o. 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  She had not had dialysis in a week because she had Covid.  On arrival to the emergency department in the wee hours of 1/30 she was described as not having any intelligible speech.  She had a head CT which was initially read as negative, but on my review shows diffuse loss of sulci as well as loss of the prepontine space on certain cuts.   On admission, her brainstem was documented is intact, moving extremities but comatose.  She was taken for dialysis.  During dialysis, her arms were described as "tonic" by nursing, heart rate was 30, and she was severely hypertensive.  She was then intubated and given Ativan.  Today, she had an MRI done which demonstrates diffuse diffusion change including in the brainstem, as well as evidence of herniation.  Her exam has markedly declined.    ROS: Unable to obtain due to altered mental status.   Past Medical History:  Diagnosis Date  . Acute renal failure (ARF) ([place])   . Anemia   . Arthritis   . History of kidney stones   . Hypertension    OFF BP MEDS X 3 WEEKS DUE TO BP CONTROL  . Lupus nephritis ([place]) 12/2016   DX DURING HOSPITAL ADMISSION  . Rash   . Staghorn calculus   . 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 Thyroid disease Mother   . Thyroid disease Father   . Diabetes Father      Social History:  reports that she has been smoking cigarettes. She has a 3.75 pack-year smoking history. She has never used smokeless tobacco. She reports current drug use. Drug: Marijuana. She reports that  she does not drink alcohol.   Exam: Current vital signs: BP 94/61   Pulse (!) 105   Temp (!) 95.36 F (35.2 C)   Resp 16   Ht '5\' 2"'$  (1.575 m)   Wt 43.9 kg   SpO2 100%   BMI 17.70 kg/m  Vital signs in last 24 hours: Temp:  [93.56 F (34.2 C)-99.32 F (37.4 C)] 95.36 F (35.2 C) (01/31 1500) Pulse Rate:  [94-132] 105 (01/31 1500) Resp:  [16-28] 16 (01/31 1500) BP: (78-225)/(33-165) 94/61 (01/31 1500) SpO2:  [96 %-100 %] 100 % (01/31 1500) Arterial Line BP: (94)/(77) 94/77 (01/30 2325) FiO2 (%):  [28 %] 28 % (01/31 1329) Weight:  [43.9 kg] 43.9 kg (01/31 0500)   Physical Exam  Constitutional: Appears well-developed and well-nourished.  Psych: does not respond Eyes: No scleral injection HENT: ET tube in place MSK: no joint deformities.  Cardiovascular: Normal rate and regular rhythm.  Respiratory: Effort normal, non-labored breathing GI: Soft.  No distension. There is no tenderness.  Skin: WDI  Mental Status: Patient does not respond to verbal stimuli.  Does not respond to deep sternal rub.  Does not follow commands.  No verbalizations are noted.   Cranial Nerves: II: patient does not respond confrontation bilaterally, pupils right 7 mm, left 8 mm,and fixed on the left, ? Sluggish reaction (7 ->6) on the right III,IV,VI: Cold  caloric responses absent bilaterally. V,VII: corneal reflex absent bilaterally  VIII: patient does not respond to verbal stimuli IX,X: cough absent XI: unable to test bilaterally due to coma XII: unable to test due to coma  Motor: Flexion to noxious stimulation bilaterally  Sensory: As above  Plantars: equivocal bilaterally  Cerebellar: Unable to perform due to coma  Gait: Unable to perform due to coma    I have reviewed labs in epic and the results pertinent to this consultation are: Creatinine 13.6, BUN 49  I have reviewed the images obtained: MRI brain-diffuse edema with herniation  Impression: 40 year old female with  diffuse cerebral edema of unclear etiology.  This was already present on her arrival, though worsened in the interim.  Possibilities include hypoxic/ischemic injury (?  Hypotensive episode at home), Posterior reversible encephalopathy syndrome, encephalitis/meningitis.  What ever the initial etiology, however, at this point she has herniated with diffusion change of the pons.  Unfortunately, given her dismal exam and these imaging findings I doubt that even aggressive measures at ICP management would be likely to improve her outcome.   Recommendations: 1) palliative care consultation/supportive care to the extent of family's wishes. 2) neurology will continue to follow  This patient is critically ill and at significant risk of neurological worsening, death and care requires constant monitoring of vital signs, hemodynamics,respiratory and cardiac monitoring, neurological assessment, discussion with family, other specialists and medical decision making of high complexity. I spent 50 minutes of neurocritical care time  in the care of  this patient. This was time spent independent of any time provided by nurse practitioner or PA.  Roland Rack, MD [facility] [phone]  If 7pm- 7am, please page neurology on call as listed in [place]. 11/02/2020  4:06 PM

## 2020-11-02 NOTE — Progress Notes (Signed)
Initial Nutrition Assessment  DOCUMENTATION CODES:   Underweight  INTERVENTION:  No plans for initiation of tube feeds at this time.  Prior to using OGT recommend repositioning tube. Per abdominal x-ray taken 1/30 OGT terminates proximal to gastric body and repositioning is recommended.  If plan is for initiation of tube feeds recommend: -Initiate Vital AF 1.2 Cal at 15 mL/hr and advance by 15 mL/hr every 12 hours to goal rate of 45 mL/hr (1080 mL goal daily volume) -Provides 1296 kcal, 81 grams of protein, 875 mL H2O daily  Monitor magnesium, potassium, and phosphorus daily for at least 3 days, MD to replete as needed, as pt is at risk for refeeding syndrome.  NUTRITION DIAGNOSIS:   Inadequate oral intake related to inability to eat as evidenced by NPO status.  GOAL:   Patient will meet greater than or equal to 90% of their needs  MONITOR:   Vent status,Labs,Weight trends,I & O's  REASON FOR ASSESSMENT:   Ventilator    ASSESSMENT:   40 year old female with PMHx of SLE with lupus nephritis, ESRD on HD, HTN, hx PRES admitted with seizures, COVID-19 infection, severe brain damage and anoxia.    1/30 intubated  Patient is currently intubated on ventilator support MV: 5.6 L/min Temp (24hrs), Avg:95.8 F (35.4 C), Min:93.56 F (34.2 C), Max:99.32 F (37.4 C)  Medications reviewed and include: Colace 100 mg BID, Epogen 2000 units with HD, Solu-Cortef 100 mg Q6hrs IV, nicotine patch, Miralax, ceftriaxone, Keppra.  Labs reviewed: CBG 136-145, CO2 16, Bun 49, Phosphorus 8.4, Magnesium 1.5.  I/O: no UOP documented at this time - pt may be anuric will continue to monitor  Enteral Access: 18 Fr. OGT placed 1/30; tip terminates proximal to gastric body and repositioning was recommended per abdominal x-ray 1/30  Patient is at risk for malnutrition.  Discussed with RN and on rounds. No plans to initiate tube feeds at this time. RD brought up on rounds that per abdominal  x-ray 1/30 OGT terminates proximal to gastric body and repositioning was recommended. MD and RN aware.  NUTRITION - FOCUSED PHYSICAL EXAM:  Flowsheet Row Most Recent Value  Orbital Region No depletion  Upper Arm Region Mild depletion  Thoracic and Lumbar Region No depletion  Buccal Region Unable to assess  Temple Region No depletion  Clavicle Bone Region No depletion  Clavicle and Acromion Bone Region No depletion  Scapular Bone Region Unable to assess  Dorsal Hand No depletion  Patellar Region Severe depletion  Anterior Thigh Region Severe depletion  Posterior Calf Region Moderate depletion  Edema (RD Assessment) None  Hair Reviewed  Eyes Unable to assess  Mouth Unable to assess  Skin Reviewed  Nails Reviewed     Diet Order:   Diet Order            Diet NPO time specified Except for: Ice Chips, Sips with Meds  Diet effective now                EDUCATION NEEDS:   No education needs have been identified at this time  Skin:  Skin Assessment: Reviewed RN Assessment  Last BM:  10/24/2020 - small type 7  Height:   Ht Readings from Last 1 Encounters:  10/10/2020 '5\' 2"'$  (1.575 m)   Weight:   Wt Readings from Last 1 Encounters:  11/02/20 43.9 kg   BMI:  Body mass index is 17.7 kg/m.  Estimated Nutritional Needs:   Kcal:  1317  Protein:  75-85 grams  Fluid:  UOP + 1 L  Jacklynn Barnacle, MS, RD, LDN Pager number available on Amion

## 2020-11-02 NOTE — Progress Notes (Signed)
Patient had a medium amount of rectal bleeding while being prepped to go down for an MRI. Rectal bleeding stopped before leaving for MRI.

## 2020-11-02 NOTE — Progress Notes (Signed)
Received a call from [place] [facility] Mr Patricia Maldonado, updated pt neurologic condition, latest v/s and MV setting, still pt has no sedation and pressor, relayed what RN AM shift reported about the discussion  with the mother with regard to  comfort care and the mother said still she need to discuss with other family member and need to think  About it, Mr Patricia Maldonado requested to inform CDS about the pt family decision- noted

## 2020-11-03 DIAGNOSIS — U071 COVID-19: Secondary | ICD-10-CM

## 2020-11-03 DIAGNOSIS — I161 Hypertensive emergency: Secondary | ICD-10-CM | POA: Diagnosis not present

## 2020-11-03 DIAGNOSIS — J9601 Acute respiratory failure with hypoxia: Secondary | ICD-10-CM | POA: Diagnosis not present

## 2020-11-03 DIAGNOSIS — G9341 Metabolic encephalopathy: Secondary | ICD-10-CM | POA: Diagnosis not present

## 2020-11-03 DIAGNOSIS — R4020 Unspecified coma: Secondary | ICD-10-CM

## 2020-11-03 DIAGNOSIS — M320 Drug-induced systemic lupus erythematosus: Secondary | ICD-10-CM

## 2020-11-03 LAB — PROCALCITONIN: Procalcitonin: 2.74 ng/mL

## 2020-11-03 LAB — CBC
HCT: 22.6 % — ABNORMAL LOW (ref 36.0–46.0)
Hemoglobin: 7.3 g/dL — ABNORMAL LOW (ref 12.0–15.0)
MCH: 28.7 pg (ref 26.0–34.0)
MCHC: 32.3 g/dL (ref 30.0–36.0)
MCV: 89 fL (ref 80.0–100.0)
Platelets: 59 10*3/uL — ABNORMAL LOW (ref 150–400)
RBC: 2.54 MIL/uL — ABNORMAL LOW (ref 3.87–5.11)
RDW: 15.4 % (ref 11.5–15.5)
WBC: 21.2 10*3/uL — ABNORMAL HIGH (ref 4.0–10.5)
nRBC: 0.1 % (ref 0.0–0.2)

## 2020-11-03 LAB — BLOOD GAS, ARTERIAL
Acid-base deficit: 2.7 mmol/L — ABNORMAL HIGH (ref 0.0–2.0)
Bicarbonate: 21.2 mmol/L (ref 20.0–28.0)
FIO2: 0.28
MECHVT: 350 mL
Mechanical Rate: 16
O2 Saturation: 98.4 %
PEEP: 5 cmH2O
Patient temperature: 37
RATE: 16 resp/min
pCO2 arterial: 32 mmHg (ref 32.0–48.0)
pH, Arterial: 7.43 (ref 7.350–7.450)
pO2, Arterial: 109 mmHg — ABNORMAL HIGH (ref 83.0–108.0)

## 2020-11-03 LAB — HAPTOGLOBIN: Haptoglobin: <10 mg/dL — ABNORMAL LOW (ref 33–278)

## 2020-11-03 LAB — GLUCOSE, CAPILLARY
Glucose-Capillary: 142 mg/dL — ABNORMAL HIGH (ref 70–99)
Glucose-Capillary: 149 mg/dL — ABNORMAL HIGH (ref 70–99)
Glucose-Capillary: 150 mg/dL — ABNORMAL HIGH (ref 70–99)

## 2020-11-03 LAB — GLUCOSE 6 PHOSPHATE DEHYDROGENASE
G6PDH: 8.5 U/g{Hb} (ref 4.7–14.6)
Hemoglobin: 7.5 g/dL — ABNORMAL LOW (ref 11.1–15.9)

## 2020-11-03 MED ORDER — ACETAMINOPHEN 325 MG PO TABS: 650.0000 mg | ORAL_TABLET | Freq: Four times a day (QID) | ORAL | Status: DC | PRN

## 2020-11-03 MED ORDER — DEXTROSE 5 % IV SOLN: INTRAVENOUS | Status: DC

## 2020-11-03 MED ORDER — DIPHENHYDRAMINE HCL 50 MG/ML IJ SOLN: 25.0000 mg | INTRAMUSCULAR | Status: DC | PRN

## 2020-11-03 MED ORDER — GLYCOPYRROLATE 0.2 MG/ML IJ SOLN: 0.2000 mg | INTRAMUSCULAR | Status: DC | PRN

## 2020-11-03 MED ORDER — ACETAMINOPHEN 650 MG RE SUPP: 650.0000 mg | Freq: Four times a day (QID) | RECTAL | Status: DC | PRN

## 2020-11-03 MED ORDER — POLYVINYL ALCOHOL 1.4 % OP SOLN
1.0000 [drp] | Freq: Four times a day (QID) | OPHTHALMIC | Status: DC | PRN
  Filled 2020-11-03: qty 15

## 2020-11-03 MED ORDER — GLYCOPYRROLATE 1 MG PO TABS
1.0000 mg | ORAL_TABLET | ORAL | Status: DC | PRN
  Filled 2020-11-03: qty 1

## 2020-11-03 DEATH — deceased

## 2020-11-04 ENCOUNTER — Other Ambulatory Visit: Payer: Medicaid Other

## 2020-11-04 LAB — MYCOPHENOLIC ACID (CELLCEPT)
MPA Glucuronide: 7 ug/mL — ABNORMAL LOW (ref 15–125)
MPA: 0.1 ug/mL — ABNORMAL LOW (ref 1.0–3.5)

## 2020-11-05 LAB — LEVETIRACETAM LEVEL: Levetiracetam Lvl: 40.9 ug/mL — ABNORMAL HIGH (ref 10.0–40.0)

## 2020-11-06 LAB — CULTURE, BLOOD (ROUTINE X 2)
Culture: NO GROWTH
Culture: NO GROWTH
Special Requests: ADEQUATE
Special Requests: ADEQUATE

## 2020-12-01 NOTE — Progress Notes (Signed)
   Nov 21, 2020 1417  Clinical Encounter Type  Visited With Family  Visit Type Follow-up  Referral From Nurse  Consult/Referral To Chaplain   I was paged by the nurse for the family of Ms. Landau. She had been extubated. The family declined pastoral care.   [place], [place]

## 2020-12-01 NOTE — Progress Notes (Signed)
Palliative-   Thank you for this consult. Chart reviewed. Discussed with Dr. Mortimer Fries.  Goals have been clarified and there is plan for withdrawal of life support today. Family is currently visiting.   Palliative will sign off- please feel free to reconsult as needed.   Mariana Kaufman, AGNP-C Palliative Medicine  Please call Palliative Medicine team phone with any questions [phone]. For individual providers please see AMION.  No charge

## 2020-12-01 NOTE — Progress Notes (Signed)
CRITICAL CARE NOTE  40 year old, current smoker, with a very complex medical history to include SLE with lupus nephritis, ESRD on hemodialysis (MWF) hypertension, priorepisode of PRES and thrombocytopenia who presented to the emergency room today after being noted to be unresponsive post a seizure at home. Details of this are sketchy. I attempted to obtain history from the patient's mother however she was not able to provide much information as she does not live with the patient. She did say that she was noted to have a seizure this morning. She apparently has a daughter who is 44 years old and cannot make decisions for her mother so the patient's mother makes decisions. The patient apparently had been quarantining at home for the last week due to potential COVID-19 and had not been going to dialysis. She apparently had not been taking her antiepileptic medication (Keppra). Initially she was noted to be combative and delirious not following commands, she was noted to have rectal bleeding with a loose stool, she was noted to be very hypertensive with initial blood pressure of 212/123 which improved after receiving labetalol in the emergency room. No further history could be obtained.  ED course: Patient has tested positive for COVID-19. She was noted to have fecal occult blood positive and has been evaluated by GI and by nephrology. At around 12:15 PM the emergency room physician was called to the patient's bedside because of the patient having gurgling respirations. She apparently had had an apparent seizure. She was noted to be tachycardic and hypertensive and at that point was intubated to protect her airway. PCCM consulted for management of respiratory failure now mechanically ventilated. She is currently being dialyzed.  1/30 severe brain damage MRI brain-Diffusely abnormal appearance of the brain with diffuse cortical/gyral swelling and edema involving both cerebral hemispheres and  cerebellum, with symmetric involvement of the deep gray nuclei.  Associated basilar cistern effacement with evidence of transtentorial herniation with the cerebellar tonsils extending up to 8 mm through the foramen magnum  1/31 severe brain damage, multiorgan failure 2/1 severe brain damage, no neurological improvement noted    CC  follow up respiratory failure  SUBJECTIVE Patient remains critically ill Prognosis is guarded   Vent Mode: PRVC FiO2 (%):  [28 %] 28 % Set Rate:  [16 bmp] 16 bmp Vt Set:  [350 mL] 350 mL PEEP:  [5 cmH20] 5 cmH20 Plateau Pressure:  [16 cmH20-17 cmH20] 17 cmH20  CBC    Component Value Date/Time   WBC 21.2 (H) 11-24-20 0203   RBC 2.54 (L) 11/24/20 0203   HGB 7.3 (L) 2020/11/24 0203   HGB 8.1 (L) 02/13/2017 1616   HCT 22.6 (L) 2020/11/24 0203   HCT 25.6 (L) 02/13/2017 1616   PLT 59 (L) 24-Nov-2020 0203   MCV 89.0 2020/11/24 0203   MCH 28.7 24-Nov-2020 0203   MCHC 32.3 11-24-20 0203   RDW 15.4 11-24-2020 0203   LYMPHSABS 0.8 11/02/2020 0200   MONOABS 0.3 11/02/2020 0200   EOSABS 0.0 11/02/2020 0200   BASOSABS 0.0 11/02/2020 0200   BMP Latest Ref Rng & Units 11/02/2020 10/26/2020 09/28/2020  Glucose 70 - 99 mg/dL 137(H) 157(H) 94  BUN 6 - 20 mg/dL 49(H) 97(H) 20  Creatinine 0.44 - 1.00 mg/dL 13.64(H) 22.78(H) 5.57(H)  Sodium 135 - 145 mmol/L 141 143 138  Potassium 3.5 - 5.1 mmol/L 4.1 5.1 3.3(L)  Chloride 98 - 111 mmol/L 104 106 98  CO2 22 - 32 mmol/L 16(L) 14(L) 27  Calcium 8.9 - 10.3 mg/dL  8.6(L) 10.3 9.1      BP 107/66   Pulse (!) 114   Temp (!) 96.62 F (35.9 C)   Resp 16   Ht '5\' 2"'$  (1.575 m)   Wt 43.9 kg   SpO2 100%   BMI 17.70 kg/m    I/O last 3 completed shifts: In: 603.6 [I.V.:138.6; Blood:265; IV Piggyback:200] Out: 0  No intake/output data recorded.  SpO2: 100 % FiO2 (%): 28 %  Estimated body mass index is 17.7 kg/m as calculated from the following:   Height as of this encounter: '5\' 2"'$  (1.575 m).    Weight as of this encounter: 43.9 kg.  SIGNIFICANT EVENTS   REVIEW OF SYSTEMS  PATIENT IS UNABLE TO PROVIDE COMPLETE REVIEW OF SYSTEMS DUE TO SEVERE CRITICAL ILLNESS        PHYSICAL EXAMINATION:  GENERAL:critically ill appearing, +resp distress HEAD: Normocephalic, atraumatic.  EYES: Pupils equal, round, reactive to light.  No scleral icterus.  MOUTH: Moist mucosal membrane. NECK: Supple.  PULMONARY: +rhonchi, +wheezing CARDIOVASCULAR: S1 and S2. Regular rate and rhythm. No murmurs, rubs, or gallops.  GASTROINTESTINAL: Soft, nontender, -distended.  Positive bowel sounds.   MUSCULOSKELETAL: No swelling, clubbing, or edema.  NEUROLOGIC: obtunded, GCS<8 SKIN:intact,warm,dry  MEDICATIONS: I have reviewed all medications and confirmed regimen as documented   CULTURE RESULTS   Recent Results (from the past 240 hour(s))  SARS Coronavirus 2 by RT PCR (hospital order, performed in [facility] hospital lab) Nasopharyngeal Nasopharyngeal Swab     Status: Abnormal   Collection Time: 10/18/2020  4:59 AM   Specimen: Nasopharyngeal Swab  Result Value Ref Range Status   SARS Coronavirus 2 POSITIVE (A) NEGATIVE Final    Comment: RESULT CALLED TO, READ BACK BY AND VERIFIED WITH:  DAWN TULLOCH AT 0600 10/10/2020 SDR (NOTE) SARS-CoV-2 target nucleic acids are DETECTED  SARS-CoV-2 RNA is generally detectable in upper respiratory specimens  during the acute phase of infection.  Positive results are indicative  of the presence of the identified virus, but do not rule out bacterial infection or co-infection with other pathogens not detected by the test.  Clinical correlation with patient history and  other diagnostic information is necessary to determine patient infection status.  The expected result is negative.  Fact Sheet for Patients:   StrictlyIdeas.no   Fact Sheet for Healthcare Providers:   BankingDealers.co.za    This test is not yet  approved or cleared by the [place] FDA and  has been authorized for detection and/or diagnosis of SARS-CoV-2 by FDA under an Emergency Use Authorization (EUA).  This EUA will remain in effect (meaning this tes t can be used) for the duration of  the COVID-19 declaration under Section 564(b)(1) of the Act, 21 U.S.C. section 360-bbb-3(b)(1), unless the authorization is terminated or revoked sooner.  Performed at [facility], [address] [place], [place] [address]   CULTURE, BLOOD (ROUTINE X 2) w Reflex to ID Panel     Status: None (Preliminary result)   Collection Time: 10/11/2020  3:57 PM   Specimen: BLOOD  Result Value Ref Range Status   Specimen Description BLOOD LEFT ANTECUBITAL  Final   Special Requests   Final    BOTTLES DRAWN AEROBIC AND ANAEROBIC Blood Culture adequate volume   Culture   Final    NO GROWTH 2 DAYS Performed at [facility], [address] [place], [place] [address]    Report Status PENDING  Incomplete  CULTURE, BLOOD (ROUTINE X 2) w Reflex to  ID Panel     Status: None (Preliminary result)   Collection Time: 10/29/2020  3:57 PM   Specimen: BLOOD  Result Value Ref Range Status   Specimen Description BLOOD BLOOD LEFT FOREARM  Final   Special Requests   Final    BOTTLES DRAWN AEROBIC AND ANAEROBIC Blood Culture adequate volume   Culture   Final    NO GROWTH 2 DAYS Performed at [facility], [address] [place], [place] [address]    Report Status PENDING  Incomplete  MRSA PCR Screening     Status: None   Collection Time: 11/02/2020  5:36 PM   Specimen: Nasopharyngeal  Result Value Ref Range Status   MRSA by PCR NEGATIVE NEGATIVE Final    Comment:        The GeneXpert MRSA Assay (FDA approved for NASAL specimens only), is one component of a comprehensive MRSA colonization surveillance program. It is not intended to diagnose MRSA infection nor to guide or monitor treatment for MRSA infections. Performed at  [facility], [address], [place], [place] [address]           IMAGING    No results found.   Nutrition Status: Nutrition Problem: Inadequate oral intake Etiology: inability to eat Signs/Symptoms: NPO status Interventions: Refer to RD note for recommendations     Indwelling Urinary Catheter continued, requirement due to   Reason to continue Indwelling Urinary Catheter strict Intake/Output monitoring for hemodynamic instability   Central Line/ continued, requirement due to  Reason to continue [place] of central venous pressure or other hemodynamic parameters and poor IV access   Ventilator continued, requirement due to severe respiratory failure   Ventilator Sedation RASS 0 to -2      ASSESSMENT AND PLAN SYNOPSIS  Synopsis: 40 year old woman with known lupus and lupus nephritis, ESRD, COVID-19 infection presented with seizures and now required intubation for airway protection. Initial presentation was that of a hypertensive emergency now with compromised airway and intubated and mechanically ventilated. She has COVID-19 infection   appear to have COVID pneumonia.   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   THERE IS NO CHANCE OF MEANINGFUL RECOVERY PATIENT WITH EXTENSIVE BRAIN SWELLING WHICH IS IRREVERSIBLE,  AGGRESSIVE MEDICAL THERAPY IS FUTILE AT THIS TIME   Severe ACUTE Hypoxic and Hypercapnic Respiratory Failure -continue Full MV support -continue Bronchodilator Therapy -Wean Fio2 and PEEP as tolerated -VAP/VENT bundle implementation  ACUTE KIDNEY INJURY/Renal Failure -continue Foley Catheter-assess need -Avoid nephrotoxic agents -Follow urine output, BMP -Ensure adequate renal perfusion, optimize oxygenation -Renal dose medications I do NOT see any more need for HD in setting of severe brain damage     NEUROLOGY Acute toxic metabolic  encephalopathy Follow up Neuro recs-assess for brain death   CARDIAC ICU monitoring  ID -continue IV abx as prescibed -follow up cultures  GI GI PROPHYLAXIS as indicated  NUTRITIONAL STATUS Nutrition Status: Nutrition Problem: Inadequate oral intake Etiology: inability to eat Signs/Symptoms: NPO status Interventions: Refer to RD note for recommendations   DIET-->TF's as tolerated Constipation protocol as indicated  ENDO - will use ICU hypoglycemic\Hyperglycemia protocol if indicated     ELECTROLYTES -follow labs as needed -replace as needed -pharmacy consultation and following   DVT/GI PRX ordered and assessed TRANSFUSIONS AS NEEDED MONITOR FSBS I Assessed the need for Labs I Assessed the need for Foley I Assessed the need for Central Venous Line Family Discussion when available I Assessed the  need for Mobilization I made an Assessment of medications to be adjusted accordingly Safety Risk assessment completed   CASE DISCUSSED IN MULTIDISCIPLINARY ROUNDS WITH ICU TEAM  Critical Care Time devoted to patient care services described in this note is 55 minutes.   Overall, patient is critically ill, prognosis is guarded.  Patient with Multiorgan failure and at high risk for cardiac arrest and death.   Recommend DNR status and comfort measures  Corrin Parker, M.D.  [facility]  Medical Director [place] Director [facility] Cardio-Pulmonary Department

## 2020-12-01 NOTE — Progress Notes (Signed)
GOALS OF CARE DISCUSSION  The Clinical status was relayed to family in detail. ALL family at bedside  Updated and notified of patients medical condition.  Patient remains unresponsive and will not open eyes to command.   Patient showing signs and symptoms BRAIN DEATH  FAMILY DID NOT CONSENT TO APNEA TEST OR BRAIN FLOW TESTS FAMILY HAS DECLINED AUTOPSY   Explained to family course of therapy and the modalities   Family understands the situation.  They have consented and agreed to DNR/DNI and would like to proceed with Comfort care measures and withdraw care  Family are satisfied with Plan of action and management. All questions answered  Additional CC time 32 mins   Danae Oland Patricia Pesa, M.D.  [facility]  Medical Director [place] Director [facility] Cardio-Pulmonary Department

## 2020-12-01 NOTE — Progress Notes (Signed)
GOALS OF CARE DISCUSSION  The Clinical status was relayed to family in detail. Mother Festus Holts   Updated and notified of patients medical condition.  Patient remains unresponsive and will not open eyes to command.   Patient showing signs of brain death  Explained to family course of therapy and the modalities     Patient with Progressive multiorgan failure with a very high probablity of a very minimal chance of meaningful recovery despite all aggressive and optimal medical therapy. Patient is in the Dying  Process associated with Suffering.  Family understands the situation. Patient remains full code, plan for apnea test and brain flow study Family are satisfied with Plan of action and management. All questions answered  Additional CC time 32 mins   Danali Marinos Patricia Pesa, M.D.  [facility]  Medical Director [place] Director [facility] Cardio-Pulmonary Department

## 2020-12-01 NOTE — Progress Notes (Addendum)
Spoke with CDS representative Saralyn Pilar to inform them that family is ready to withdraw care. Awaiting call from [facility].

## 2020-12-01 NOTE — Progress Notes (Signed)
Pt extubated to room air with family at bedside per MD orders. Comfort care initiated.

## 2020-12-01 NOTE — Progress Notes (Signed)
   2020/11/04 1336  Clinical Encounter Type  Visited With Family  Visit Type Initial  Referral From Physician  Consult/Referral To Chaplain   As the chaplain on call, I received a page to the family of Ms. Lackie. I spoke with the family, and they declined pastoral care.  [place], [place]

## 2020-12-01 NOTE — Progress Notes (Signed)
Pt expired at 1434. Charli, RN and myself double verified to pronounce death. Dr. Mortimer Fries, MD aware of time of death. Family at bedside.

## 2020-12-01 NOTE — Plan of Care (Signed)
Patient is extubated to room air per order

## 2020-12-01 NOTE — Progress Notes (Signed)
Subjective: No significant events  Exam: Vitals:   11/27/20 0800 11/27/20 0811  BP: 107/69   Pulse: (!) 115   Resp: 16   Temp: (!) 97.16 F (36.2 C)   SpO2: 99% 100%   Gen: In bed, intubated Resp: ventilated Abd: soft, nt  Neuro: Mental Status: Patient does not respond to verbal stimuli.  Does not respond to deep sternal rub.  Does not follow commands.  No verbalizations are noted.   Cranial Nerves: II: patient does not respond confrontation bilaterally, pupils right 5 mm, left 7 mm,and fixed bilaterally III,IV,VI: Doll's eye response absent V,VII: corneal reflex absent bilaterally  VIII: patient does not respond to verbal stimuli IX,X: gag reflex absent,  XI: unable to test bilaterally due to coma XII: unable to test due to coma  Motor: Extremities flaccid throughout.  No spontaneous movement noted.  No purposeful movements noted.  Sensory: Does not respond to noxious stimuli in any extremity.  Plantars: Triple flexion  Cerebellar: Unable to perform due to coma  Gait: Unable to perform due to coma   Pertinent Labs: Elevated WBC  Impression: 40 year old female with diffuse cerebral edema of unclear etiology.  This was already present on her arrival, though worsened in the interim.  Possibilities include hypoxic/ischemic injury (?  Hypotensive episode at home), Posterior reversible encephalopathy syndrome, encephalitis/meningitis.  What ever the initial etiology, however, at this point she has herniated with a clinical exam consistent with brain death at this point. Given her young age, may consider ancillary testing such as NM perfusion scan.   Recommendations: 1) If apnea test confirms lack of respiratory effort, this would be consistent with brain death. 2) Could consider NM perfusion scan to confirm lack of intracranial blood flow  This patient is critically ill and at significant risk of neurological worsening, death and care requires constant monitoring  of vital signs, hemodynamics,respiratory and cardiac monitoring, neurological assessment, discussion with family, other specialists and medical decision making of high complexity. I spent 35 minutes of neurocritical care time  in the care of  this patient. This was time spent independent of any time provided by nurse practitioner or PA.  Roland Rack, MD [facility] [phone]  If 7pm- 7am, please page neurology on call as listed in [place]. November 27, 2020  10:50 AM

## 2020-12-01 NOTE — Progress Notes (Signed)
Spoke with [name], [facility] from CDS. Patient is medically ruled out for solid organ donation. Okay to withdraw ventilator support. Bedside RN to call back with cardiac time-of-death.

## 2020-12-01 NOTE — Procedures (Signed)
Patient Name: Patricia Maldonado  MRN: [id number]  Epilepsy Attending: Lora Havens  Referring Physician/Provider: Dr. Vernard Gambles Date: 11/02/2018 Duration: 21.13 mins  Patient history: 40 year old female with diffuse cerebral edema.  EEG to evaluate for seizures.  Level of alertness: Comatose  AEDs during EEG study: Keppra  Technical aspects: This EEG study was done with scalp electrodes positioned according to the 10-20 International system of electrode placement. Electrical activity was acquired at a sampling rate of '500Hz'$  and reviewed with a high frequency filter of '70Hz'$  and a low frequency filter of '1Hz'$ . EEG data were recorded continuously and digitally stored.   Description: EEG showed continuous generalized background suppression.  EEG was not reactive to tactile stimulation.  Hyperventilation and photic stimulation were not performed.     ABNORMALITY -Background suppression, generalized  IMPRESSION: This study is suggestive of profound diffuse encephalopathy, nonspecific etiology.  No seizures or epileptiform discharges were seen throughout the recording.  Patricia Maldonado [name]

## 2020-12-01 NOTE — Death Summary Note (Signed)
DEATH SUMMARY   Patient Details  Name: Patricia Maldonado MRN: [id number] DOB: 1981-09-07  Admission/Discharge Information   Admit Date:  26-Nov-2020  Date of Death: Date of Death: November 28, 2020  Time of Death: Time of Death: 47  Length of Stay: 2  Referring Physician: Letta Median, MD   Reason(s) for Hospitalization  COVID 19 infection   Diagnoses  Preliminary cause of death: COVID 71 infection, HTN emergency, Brain damage, Lupus Secondary Diagnoses (including complications and co-morbidities):  Principal Problem:   Hypertensive emergency Active Problems:   Lupus nephritis ([place])   Systemic lupus erythematosus ([place])   ESRD on hemodialysis ([place])   Essential hypertension   Anemia in ESRD (end-stage renal disease) ([place])   Thrombocytopenia ([place])   Seizures ([place])   Tobacco abuse   COVID-19 virus infection   Leukocytosis   Acute metabolic encephalopathy   Rectal bleeding   Depression   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 Details of this are sketchy. I attempted to obtain history from the patient's mother however she was not able to provide much information as she does not live with the patient. She did say that she was noted to have a seizure this morning. She apparently has a daughter who is 45 years old and cannot make decisions for her mother so the patient's mother makes decisions. The patient apparently had been quarantining at home for the last week due to potential COVID-19 and had not been going to dialysis. She apparently had not  been taking her antiepileptic medication (Keppra). Initially she was noted to be combative and delirious not following commands, she was noted to have rectal bleeding with a loose stool, she was noted to be very hypertensive with initial blood pressure of 212/123 which improved after receiving labetalol in the emergency room. No further history could be obtained.  ED course: Patient has tested positive for COVID-19. She was noted to have fecal occult blood positive and has been evaluated by GI and by nephrology. At around 12:15 PM the emergency room physician was called to the patient's bedside because of the patient having gurgling respirations. She apparently had had an apparent seizure. She was noted to be tachycardic and hypertensive and at that point was intubated to protect her airway. PCCM consulted for management of respiratory failure now mechanically ventilated. She is currently being dialyzed.  November 26, 2022 severe brain damage MRI brain-Diffusely abnormal appearance of the brain with diffuse cortical/gyral swelling and edema involving both cerebral hemispheres and cerebellum, with symmetric involvement of the deep gray nuclei.  Associated basilar cistern effacement with evidence of transtentorial herniation with the cerebellar tonsils extending up to 8 mm through the foramen magnum  1/31 severe brain damage, multiorgan failure 2/1 severe brain damage, no neurological improvement noted   GOALS OF CARE DISCUSSION  The Clinical status was relayed to family in detail.  Updated and notified of patients medical condition.  Patient remains unresponsive and will not open eyes to command.     Patient remains unresponsive and will not open eyes to command.  Patient showing signs and symptoms BRAIN DEATH  FAMILY DID NOT  CONSENT TO APNEA TEST OR BRAIN FLOW TESTS FAMILY HAS DECLINED AUTOPSY  Patient with Progressive multiorgan failure with a very high probablity of a very minimal  chance of meaningful recovery despite all aggressive and optimal medical therapy. Patient is in the Dying  Process associated with Suffering.  Family understands the situation.  They have consented and agreed to DNR/DNI and would like to proceed with Comfort care measures.  Family are satisfied with Plan of action and management. All questions answered     Pertinent Labs and Studies  Significant Diagnostic Studies EEG  Result Date: 2020-11-15 Lora Havens, MD     2020-11-15  9:53 AM Patient Name: CORALEE FEDEWA MRN: [id number] Epilepsy Attending: Lora Havens Referring Physician/Provider: Dr. Vernard Gambles Date: 11/02/2018 Duration: 21.13 mins Patient history: 40 year old female with diffuse cerebral edema.  EEG to evaluate for seizures. Level of alertness: Comatose AEDs during EEG study: Keppra Technical aspects: This EEG study was done with scalp electrodes positioned according to the 10-20 International system of electrode placement. Electrical activity was acquired at a sampling rate of '500Hz'$  and reviewed with a high frequency filter of '70Hz'$  and a low frequency filter of '1Hz'$ . EEG data were recorded continuously and digitally stored. Description: EEG showed continuous generalized background suppression.  EEG was not reactive to tactile stimulation.  Hyperventilation and photic stimulation were not performed.   ABNORMALITY -Background suppression, generalized IMPRESSION: This study is suggestive of profound diffuse encephalopathy, nonspecific etiology.  No seizures or epileptiform discharges were seen throughout the recording. [place] Foreign Body  Result Date: 10/10/2020 CLINICAL DATA:  Metal working/exposure; clearance prior to MRI EXAM: ORBITS FOR FOREIGN BODY - 2 VIEW COMPARISON:  CT from the same day FINDINGS: Exam is limited by patient positioning and patient condition. There was no definite metallic foreign body identified on this examination. The patient appears  to be intubated. IMPRESSION: No evidence of metallic foreign body within the orbits. Electronically Signed   By: Constance Holster M.D.   On: 10/08/2020 18:21   DG Abd 1 View  Result Date: 10/26/2020 CLINICAL DATA:  Metallic foreign body EXAM: ABDOMEN - 1 VIEW COMPARISON:  None. FINDINGS: The bowel gas pattern is nonspecific and nonobstructive. There are no definite radiopaque kidney stones. No unexpected metallic foreign body. Phleboliths project over the patient's pelvis. The tip of the enteric tube is visualized and may be terminate short of the stomach. IMPRESSION: 1. No unexpected metallic foreign body. 2. Nonobstructive bowel gas pattern. 3. The enteric tube appears to terminate proximal to the gastric body. Repositioning is recommended. These results will be called to the ordering clinician or representative by the Radiologist Assistant, and communication documented in the PACS or [employer]. Electronically Signed   By: Constance Holster M.D.   On: 10/05/2020 18:22   CT Head Wo Contrast  Addendum Date: 11/02/2020   ADDENDUM REPORT: 11/02/2020 14:20 ADDENDUM: Original report by Dr. Thornton Papas. Addendum by Dr. Jeralyn Ruths on 11/02/2020 at 2:12 p.m.: The study was discussed with Dr. Leonel Ramsay in light of the findings on the subsequent MRI. Upon further review and when comparing with the prior head CT of 09/25/2020, the current CT demonstrates new effacement of the cerebral sulci and basilar cisterns consistent with diffuse cerebral edema. These findings progressed on the subsequent MRI with development of cerebellar tonsillar herniation. Electronically Signed   By: Logan Bores M.D.   On: 11/02/2020 14:20   Result Date: 11/02/2020 CLINICAL DATA:  Nontraumatic seizure EXAM:  CT HEAD WITHOUT CONTRAST TECHNIQUE: Contiguous axial images were obtained from the base of the skull through the vertex without intravenous contrast. COMPARISON:  09/25/2020 FINDINGS: Brain: Normal ventricular morphology. No midline  shift or mass effect. Normal appearance of brain parenchyma. No intracranial hemorrhage, mass lesion, or evidence of acute infarction. No extra-axial fluid collections. Vascular: No hyperdense vessels Skull: Intact Sinuses/Orbits: Minimal mucosal thickening in sphenoid sinus and a few ethmoid air cells. Otherwise clear paranasal sinuses and mastoid air cells Other: N/A IMPRESSION: No acute intracranial abnormalities. Electronically Signed: By: Lavonia Dana M.D. On: 10/27/2020 13:22   MR BRAIN WO CONTRAST  Addendum Date: 11/02/2020   ADDENDUM REPORT: 11/02/2020 06:15 ADDENDUM: Findings again communicated by telephone to the critical care nurse practitioner Domingo Pulse at 11:41 p.m. on 10/24/2020 by Dr. Acquanetta Chain. Electronically Signed   By: Jeannine Boga M.D.   On: 11/02/2020 06:15   Result Date: 11/02/2020 CLINICAL DATA:  Initial evaluation for abnormal neuro exam, seizure. EXAM: MRI HEAD WITHOUT CONTRAST TECHNIQUE: Multiplanar, multiecho pulse sequences of the brain and surrounding structures were obtained without intravenous contrast. COMPARISON:  Prior head CT from earlier the same day. FINDINGS: Brain: Brain is diffusely abnormal in appearance with diffuse cortical/gyral swelling and edema involving both cerebral hemispheres in a fairly symmetric fashion, with extensive involvement of the cerebellum as well. Increased T2/FLAIR hyperintensity seen involving the deep gray nuclei as well. Associated diffuse loss of cortical sulcation, consistent with edema. Ventricles are slit-like in appearance. Basilar cisterns are largely effaced. Evidence for transtentorial herniation with the cerebellar tonsils extending up to 8 mm through the foramen magnum (series 9, image 10). No definite associated diffusion abnormality, although DWI sequences are diffusely abnormal in appearance due to the underlying parenchymal changes throughout the brain. There are a few scattered foci of increased T2/FLAIR signal  intensity involving the periventricular white matter as well as the splenium of the corpus callosum (series 15, images 30, 32). Possible additional superimposed patchy T2/FLAIR signal abnormality noted within the cerebellum as well (series 15, image 15). Findings are nonspecific, but most consistent with global cerebral edema, possibly related to anoxia and/or hypoperfusion. A degree of superimposed press may be contributory given the periventricular and cerebellar signal changes. No acute intracranial hemorrhage. Diffuse prominence and engorgement of the venous structures seen throughout the brain on SWI sequence related to underlying cerebral edema. No mass lesion or midline shift.  No extra-axial fluid collection. Vascular: Major intracranial vascular flow voids are not well assessed due to the diffuse cerebral edema, although the ICAs do appear to be grossly patent through the proximal cavernous segments. Posterior circulation not well seen. Major dural sinuses appear compressed related to edema. Skull and upper cervical spine: Evidence of transtentorial herniation with the cerebellar tonsils extending up to 8 mm through the foramen magnum. Secondary crowding at the craniocervical junction. Bone marrow signal intensity within normal limits. No scalp soft tissue abnormality. Sinuses/Orbits: Scattered thickening noted along the posterior sclera/retina of both globes, of uncertain significance (series 15, image 16, 20), of uncertain significance. Globes and orbital soft tissues otherwise demonstrate no acute finding. Scattered mucosal thickening seen throughout the paranasal sinuses. Small bilateral mastoid effusions noted. Patient is intubated. Other: None. IMPRESSION: 1. Diffusely abnormal appearance of the brain with diffuse cortical/gyral swelling and edema involving both cerebral hemispheres and cerebellum, with symmetric involvement of the deep gray nuclei. Findings are nonspecific, but consistent with global  cerebral edema, possibly related to anoxic and/or hypoperfusion injury. Possible severe COVID-19 encephalitis could  also be considered. 2. 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 3. Few scattered superimposed patchy T2/FLAIR signal abnormality involving the periventricular white matter, corpus callosum, and cerebellum, nonspecific, but could reflect a degree of superimposed PRES and/or changes related to COVID encephalitis. 4. Irregular thickening along the posterior sclera/retina of both globes, of uncertain significance, but possibly related to elevated ICP. Correlation with physical exam recommended. Critical Value/emergent results were phoned communicated by telephone to the critical care nurse taking care of the patient at 11:24 p.m. on 10/06/2020. Currently awaiting a call back from the covering critical care nurse practitioner to communicate these findings as well. Electronically Signed: By: Jeannine Boga M.D. On: 10/05/2020 23:30   DG Chest Portable 1 View  Result Date: 10/09/2020 CLINICAL DATA:  Status post intubation. EXAM: PORTABLE CHEST 1 VIEW COMPARISON:  Earlier today. FINDINGS: Interval endotracheal tube in satisfactory position. Stable right jugular catheter with its tip in the inferior aspect of the right atrium. Mildly enlarged cardiac silhouette with an interval decrease in size. The previously seen nodular density overlying the left lower lung zone is currently projected more inferiorly and, overlying the upper left diaphragm and shown to represent a monitor lead attachment. Clear lungs with normal vascularity. Unremarkable bones. IMPRESSION: 1. Endotracheal tube in satisfactory position. 2. No acute abnormality. Electronically Signed   By: Claudie Revering M.D.   On: 10/20/2020 12:50   DG Chest Portable 1 View  Result Date: 10/05/2020 CLINICAL DATA:  Seizure, ESRD,  missed dialysis. EXAM: PORTABLE CHEST 1 VIEW COMPARISON:  Chest x-ray 09/28/2020, CT chest 07/07/2020 FINDINGS: Right chest wall dialysis catheter with tip overlying the right atrium. The heart size and mediastinal contours are unchanged. Vague oval 2.4 cm density overlying the left mid lung zone likely external to the patient. No focal consolidation. No pulmonary edema. No pleural effusion. No pneumothorax. No acute osseous abnormality. IMPRESSION: 1. No active disease. 2. Vague oval 2.4 cm density overlying the left mid lung zone likely external to the patient. Electronically Signed   By: Iven Finn M.D.   On: 10/22/2020 06:17    Microbiology Recent Results (from the past 240 hour(s))  SARS Coronavirus 2 by RT PCR (hospital order, performed in [facility] hospital lab) Nasopharyngeal Nasopharyngeal Swab     Status: Abnormal   Collection Time: 10/26/2020  4:59 AM   Specimen: Nasopharyngeal Swab  Result Value Ref Range Status   SARS Coronavirus 2 POSITIVE (A) NEGATIVE Final    Comment: RESULT CALLED TO, READ BACK BY AND VERIFIED WITH:  DAWN TULLOCH AT 0600 10/23/2020 SDR (NOTE) SARS-CoV-2 target nucleic acids are DETECTED  SARS-CoV-2 RNA is generally detectable in upper respiratory specimens  during the acute phase of infection.  Positive results are indicative  of the presence of the identified virus, but do not rule out bacterial infection or co-infection with other pathogens not detected by the test.  Clinical correlation with patient history and  other diagnostic information is necessary to determine patient infection status.  The expected result is negative.  Fact Sheet for Patients:   StrictlyIdeas.no   Fact Sheet for Healthcare Providers:   BankingDealers.co.za    This test is not yet approved or cleared by the [place] FDA and  has been authorized for detection and/or diagnosis of SARS-CoV-2 by FDA under an Emergency Use  Authorization (EUA).  This EUA will remain in effect (meaning this tes t can be  used) for the duration of  the COVID-19 declaration under Section 564(b)(1) of the Act, 21 U.S.C. section 360-bbb-3(b)(1), unless the authorization is terminated or revoked sooner.  Performed at [facility], [address] [place], [place] [address]   CULTURE, BLOOD (ROUTINE X 2) w Reflex to ID Panel     Status: None (Preliminary result)   Collection Time: 10/13/2020  3:57 PM   Specimen: BLOOD  Result Value Ref Range Status   Specimen Description BLOOD LEFT ANTECUBITAL  Final   Special Requests   Final    BOTTLES DRAWN AEROBIC AND ANAEROBIC Blood Culture adequate volume   Culture   Final    NO GROWTH 3 DAYS Performed at [facility], [address] [place], [place] [address]    Report Status PENDING  Incomplete  CULTURE, BLOOD (ROUTINE X 2) w Reflex to ID Panel     Status: None (Preliminary result)   Collection Time: 10/13/2020  3:57 PM   Specimen: BLOOD  Result Value Ref Range Status   Specimen Description BLOOD BLOOD LEFT FOREARM  Final   Special Requests   Final    BOTTLES DRAWN AEROBIC AND ANAEROBIC Blood Culture adequate volume   Culture   Final    NO GROWTH 3 DAYS Performed at [facility], [address] [place], [place] [address]    Report Status PENDING  Incomplete  MRSA PCR Screening     Status: None   Collection Time: 10/14/2020  5:36 PM   Specimen: Nasopharyngeal  Result Value Ref Range Status   MRSA by PCR NEGATIVE NEGATIVE Final    Comment:        The GeneXpert MRSA Assay (FDA approved for NASAL specimens only), is one component of a comprehensive MRSA colonization surveillance program. It is not intended to diagnose MRSA infection nor to guide or monitor treatment for MRSA infections. Performed at [facility], [address] [place], [place] [address]     Lab Basic Metabolic Panel: Recent Labs  Lab 10/03/2020 0459 11/02/20 0201   NA 143 141  K 5.1 4.1  CL 106 104  CO2 14* 16*  GLUCOSE 157* 137*  BUN 97* 49*  CREATININE 22.78* 13.64*  CALCIUM 10.3 8.6*  MG 2.6* 1.5*  PHOS  --  8.4*   Liver Function Tests: Recent Labs  Lab 10/21/2020 0459  AST 20  ALT 6  ALKPHOS 58  BILITOT 0.8  PROT 8.3*  ALBUMIN 4.1   No results for input(s): LIPASE, AMYLASE in the last 168 hours. No results for input(s): AMMONIA in the last 168 hours. CBC: Recent Labs  Lab 10/28/2020 0459 10/31/2020 0838 11/02/20 0200 11/02/20 0847 11/02/20 1127 11/02/20 2036 Nov 23, 2020 0203  WBC 14.9*   < > 16.2* 19.9* 19.2* 21.9* 21.2*  NEUTROABS 13.4*  --  14.9*  --   --   --   --   HGB 10.6*   < > 8.6* 9.5* 8.5* 8.2* 7.3*  HCT 33.7*   < > 26.3* 28.1* 26.5* 25.2* 22.6*  MCV 91.3   < > 88.6 87.5 88.6 89.0 89.0  PLT 107*   < > 50* 65* 62* 64* 59*   < > = values in this interval not displayed.   Cardiac Enzymes: No results for input(s): CKTOTAL, CKMB, CKMBINDEX, TROPONINI in the last 168 hours. Sepsis Labs: Recent Labs  Lab 10/22/2020 1027 10/21/2020 1557 11/02/20 0001 11/02/20 0201 11/02/20 0847 11/02/20 1127 11/02/20 2036 11/23/20 0203  PROCALCITON  --  0.29  --  2.21  --   --   --  2.74  WBC  --  22.1*   < >  --  19.9* 19.2* 21.9* 21.2*  LATICACIDVEN 2.3*  --   --   --   --   --   --   --    < > = values in this interval not displayed.     Taylon Louison 11/04/2020, 8:08 AM

## 2020-12-01 DEATH — deceased

## 2021-11-28 IMAGING — CT CT HEAD W/O CM
3 series · 16 of 44 positions shown, 19 images · non-contrast
Comparison: 08/19/2020

CLINICAL DATA: Head trauma, headache, hypertension, missed dialysis

EXAM:
CT HEAD WITHOUT CONTRAST
TECHNIQUE: Contiguous axial images were obtained from the base of the skull
through the vertex without intravenous contrast.

[Series 2: head wo · axial · 0.38mm/px · z∈[+600,+710]mm · 10 of 27 slices shown, 13 images]
[im 3/27  brain]
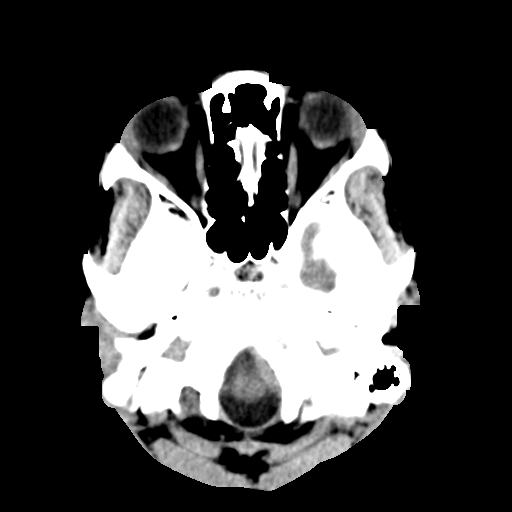
[im 3/27  bone]
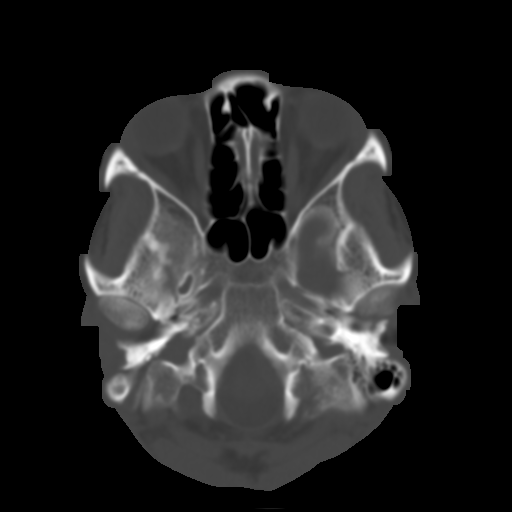
[im 5/27  brain]
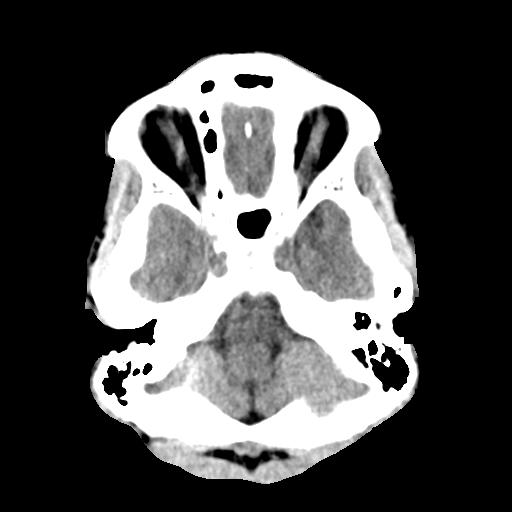
[im 8/27  brain]
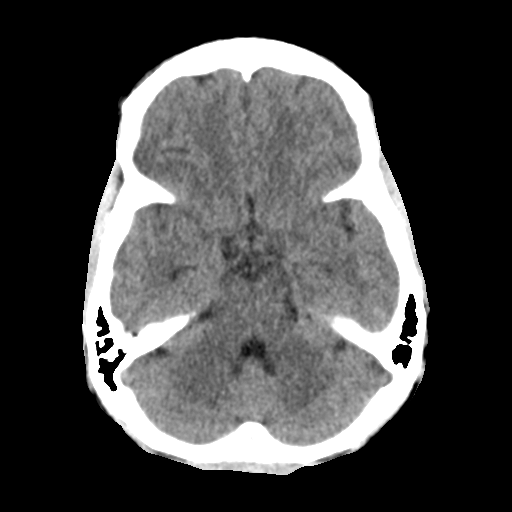
[im 10/27  brain]
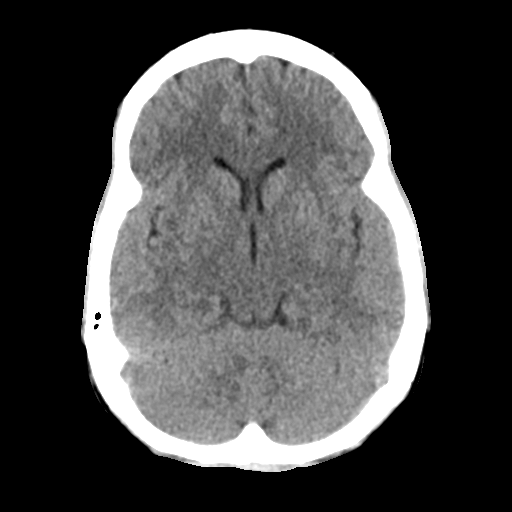
[im 13/27  brain]
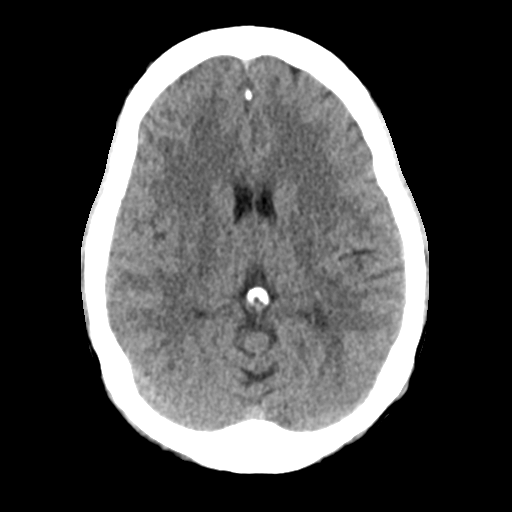
[im 13/27  bone]
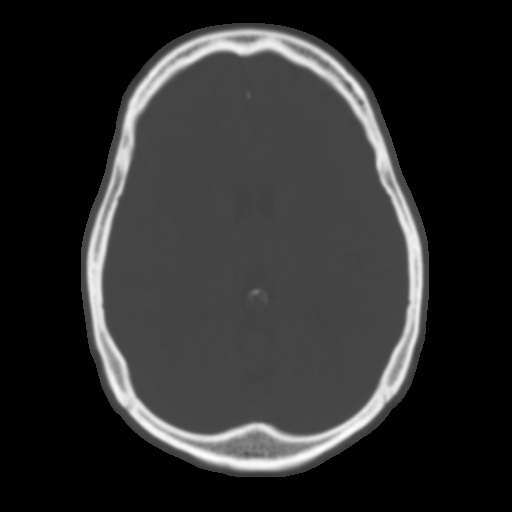
[im 15/27  brain]
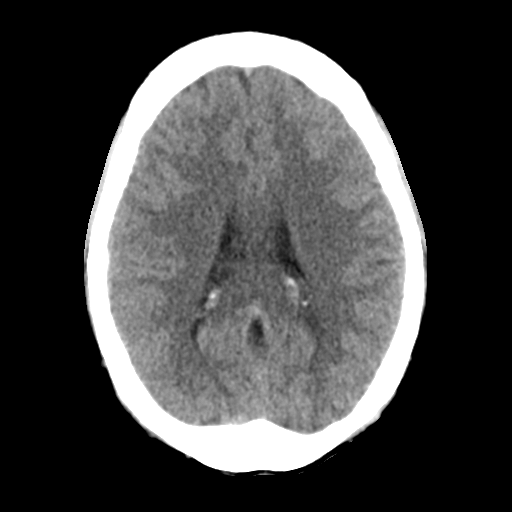
[im 18/27  brain]
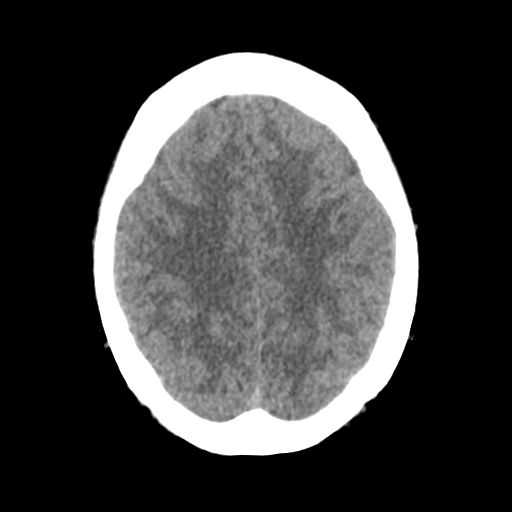
[im 20/27  brain]
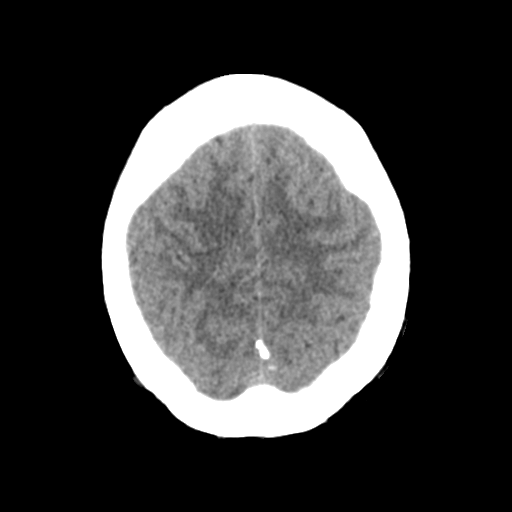
[im 23/27  brain]
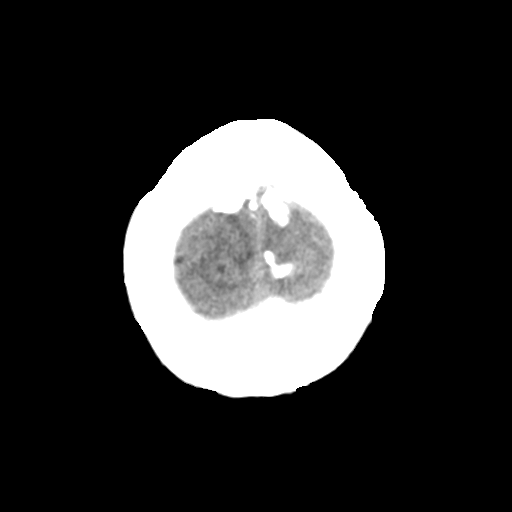
[im 23/27  bone]
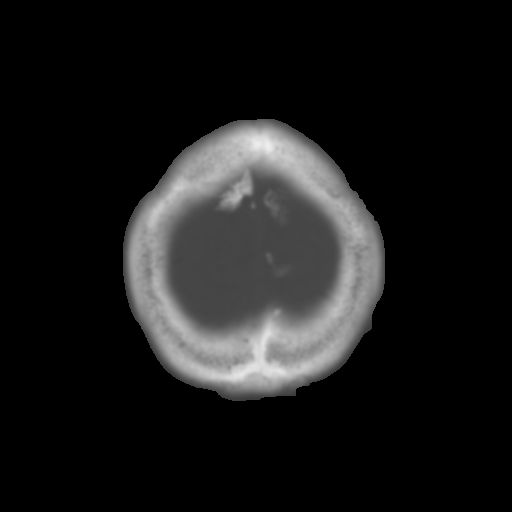
[im 25/27  brain]
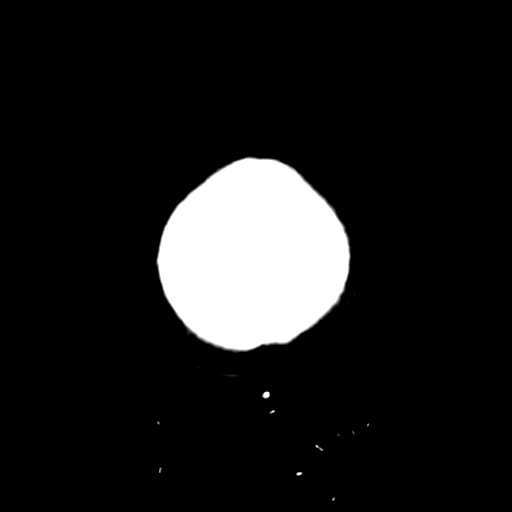

[Series 4: coronal soft tissue · coronal · 0.28mm/px · 3 of 62 slices shown]
[im 21/62  brain]
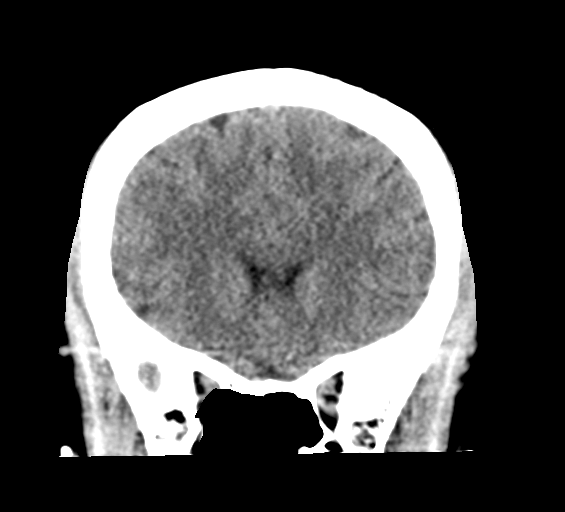
[im 28/62  brain]
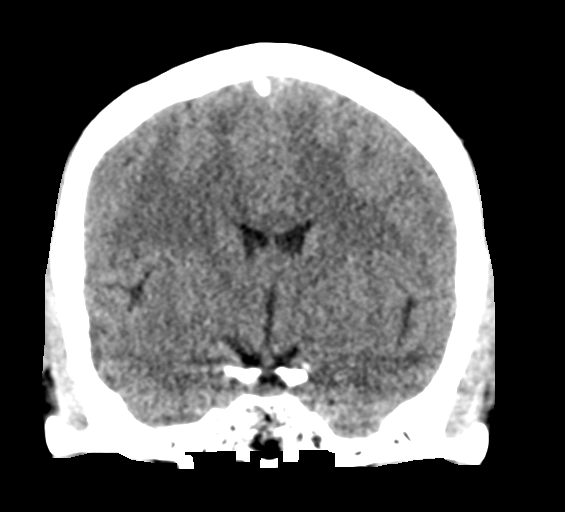
[im 34/62  brain]
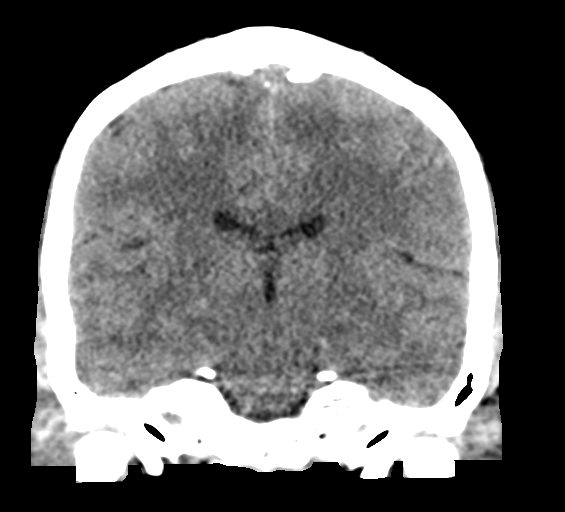

[Series 5: sagittal soft tissue · sagittal · 0.28mm/px · 3 of 54 slices shown]
[im 18/54  brain]
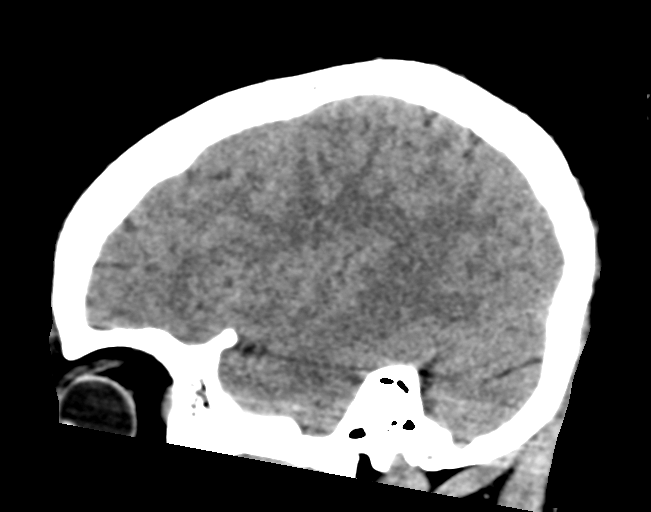
[im 27/54  brain]
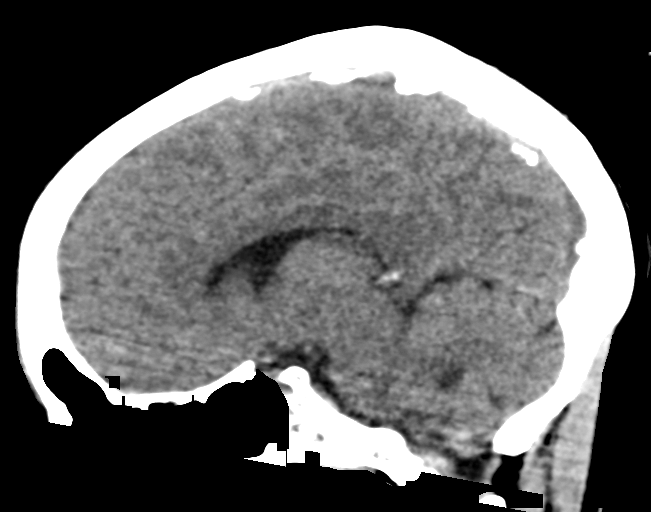
[im 36/54  brain]
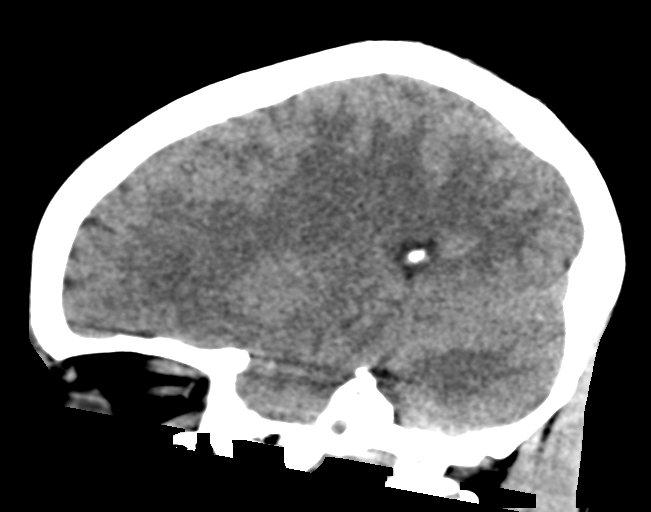

[16 of 44 positions shown; findings below may reference images not displayed]

FINDINGS: Brain: No evidence of acute infarction, hemorrhage, hydrocephalus,
extra-axial collection or mass lesion/mass effect.

Vascular: No hyperdense vessel or unexpected calcification.

Skull: Normal. Negative for fracture or focal lesion.

Sinuses/Orbits: No acute finding.

Other: None.
IMPRESSION: No acute intracranial pathology.
